# Patient Record
Sex: Female | Born: 1945
Health system: Southern US, Community
[De-identification: ages and names within clinical notes are randomized; demographics above are authoritative.]

## PROBLEM LIST (undated history)

## (undated) DIAGNOSIS — E0520 Thyrotoxicosis with toxic multinodular goiter without thyrotoxic crisis or storm: Secondary | ICD-10-CM

## (undated) DIAGNOSIS — D860 Sarcoidosis of lung: Secondary | ICD-10-CM

## (undated) DIAGNOSIS — E039 Hypothyroidism, unspecified: Secondary | ICD-10-CM

## (undated) DIAGNOSIS — R0989 Other specified symptoms and signs involving the circulatory and respiratory systems: Secondary | ICD-10-CM

## (undated) DIAGNOSIS — E559 Vitamin D deficiency, unspecified: Secondary | ICD-10-CM

## (undated) DIAGNOSIS — R7303 Prediabetes: Secondary | ICD-10-CM

## (undated) DIAGNOSIS — E7800 Pure hypercholesterolemia, unspecified: Secondary | ICD-10-CM

## (undated) DIAGNOSIS — R42 Dizziness and giddiness: Secondary | ICD-10-CM

## (undated) DIAGNOSIS — H5440 Blindness, one eye, unspecified eye: Secondary | ICD-10-CM

## (undated) DIAGNOSIS — H31019 Macula scars of posterior pole (postinflammatory) (post-traumatic), unspecified eye: Secondary | ICD-10-CM

## (undated) DIAGNOSIS — I10 Essential (primary) hypertension: Secondary | ICD-10-CM

## (undated) HISTORY — DX: Dizziness and giddiness: R42

## (undated) HISTORY — DX: Sarcoidosis of lung: D86.0

## (undated) HISTORY — DX: Hypothyroidism, unspecified: E03.9

## (undated) HISTORY — DX: Essential (primary) hypertension: I10

## (undated) HISTORY — DX: Other specified symptoms and signs involving the circulatory and respiratory systems: R09.89

## (undated) HISTORY — DX: Vitamin D deficiency, unspecified: E55.9

## (undated) HISTORY — DX: Prediabetes: R73.03

## (undated) HISTORY — DX: Blindness, one eye, unspecified eye: H54.40

## (undated) HISTORY — PX: COLONOSCOPY: SHX174

## (undated) HISTORY — DX: Macula scars of posterior pole (postinflammatory) (post-traumatic), unspecified eye: H31.019

## (undated) HISTORY — DX: Thyrotoxicosis with toxic multinodular goiter without thyrotoxic crisis or storm: E05.20

## (undated) HISTORY — DX: Pure hypercholesterolemia, unspecified: E78.00

---
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
## 1968-09-30 HISTORY — PX: BACK SURGERY: SHX140

## 1996-09-30 HISTORY — PX: NASAL SEPTUM SURGERY: SHX37

## 1998-07-12 ENCOUNTER — Ambulatory Visit (HOSPITAL_COMMUNITY): Admission: RE | Admit: 1998-07-12 | Discharge: 1998-07-12 | Payer: Self-pay | Admitting: Obstetrics and Gynecology

## 1998-07-12 ENCOUNTER — Encounter: Payer: Self-pay | Admitting: Obstetrics and Gynecology

## 1998-09-30 HISTORY — PX: TOTAL ABDOMINAL HYSTERECTOMY: SHX209

## 2000-01-29 ENCOUNTER — Ambulatory Visit (HOSPITAL_COMMUNITY): Admission: RE | Admit: 2000-01-29 | Discharge: 2000-01-29 | Payer: Self-pay | Admitting: Obstetrics and Gynecology

## 2000-01-29 ENCOUNTER — Other Ambulatory Visit: Admission: RE | Admit: 2000-01-29 | Discharge: 2000-01-29 | Payer: Self-pay | Admitting: Obstetrics and Gynecology

## 2000-03-18 ENCOUNTER — Encounter (INDEPENDENT_AMBULATORY_CARE_PROVIDER_SITE_OTHER): Payer: Self-pay

## 2000-03-18 ENCOUNTER — Inpatient Hospital Stay (HOSPITAL_COMMUNITY): Admission: RE | Admit: 2000-03-18 | Discharge: 2000-03-20 | Payer: Self-pay | Admitting: Obstetrics and Gynecology

## 2000-09-30 HISTORY — PX: KNEE ARTHROSCOPY: SUR90

## 2001-03-09 ENCOUNTER — Encounter: Admission: RE | Admit: 2001-03-09 | Discharge: 2001-05-06 | Payer: Self-pay | Admitting: Orthopedic Surgery

## 2001-05-20 ENCOUNTER — Other Ambulatory Visit: Admission: RE | Admit: 2001-05-20 | Discharge: 2001-05-20 | Payer: Self-pay | Admitting: Obstetrics and Gynecology

## 2002-03-01 ENCOUNTER — Encounter: Payer: Self-pay | Admitting: Urology

## 2002-03-01 ENCOUNTER — Encounter: Admission: RE | Admit: 2002-03-01 | Discharge: 2002-03-01 | Payer: Self-pay | Admitting: Urology

## 2003-06-10 ENCOUNTER — Ambulatory Visit (HOSPITAL_COMMUNITY): Admission: RE | Admit: 2003-06-10 | Discharge: 2003-06-10 | Payer: Self-pay | Admitting: Internal Medicine

## 2003-06-10 ENCOUNTER — Encounter: Payer: Self-pay | Admitting: Internal Medicine

## 2005-07-03 ENCOUNTER — Ambulatory Visit: Payer: Self-pay | Admitting: Internal Medicine

## 2005-07-15 ENCOUNTER — Encounter (INDEPENDENT_AMBULATORY_CARE_PROVIDER_SITE_OTHER): Payer: Self-pay | Admitting: *Deleted

## 2005-07-15 ENCOUNTER — Ambulatory Visit: Payer: Self-pay | Admitting: Internal Medicine

## 2006-07-07 ENCOUNTER — Encounter: Admission: RE | Admit: 2006-07-07 | Discharge: 2006-09-17 | Payer: Self-pay | Admitting: Orthopedic Surgery

## 2007-03-03 ENCOUNTER — Emergency Department (HOSPITAL_COMMUNITY): Admission: EM | Admit: 2007-03-03 | Discharge: 2007-03-03 | Payer: Self-pay | Admitting: Emergency Medicine

## 2008-03-15 ENCOUNTER — Emergency Department (HOSPITAL_COMMUNITY): Admission: EM | Admit: 2008-03-15 | Discharge: 2008-03-15 | Payer: Self-pay | Admitting: Emergency Medicine

## 2008-10-17 ENCOUNTER — Encounter: Admission: RE | Admit: 2008-10-17 | Discharge: 2008-10-17 | Payer: Self-pay | Admitting: Internal Medicine

## 2010-06-29 ENCOUNTER — Encounter (INDEPENDENT_AMBULATORY_CARE_PROVIDER_SITE_OTHER): Payer: Self-pay | Admitting: *Deleted

## 2010-11-01 NOTE — Letter (Signed)
Summary: Colonoscopy Letter  [facility]  [address] [place], [place] [address]   Phone: [phone]  Fax: [phone]      June 29, 2010 MRN: [id number]   Laura Maynard [address] [place], [place]  [address]   Dear [name],   According to your medical record, it is time for you to schedule a Colonoscopy. The American Cancer Society recommends this procedure as a method to detect early colon cancer. Patients with a family history of colon cancer, or a personal history of colon polyps or inflammatory bowel disease are at increased risk.  This letter has beeen generated based on the recommendations made at the time of your procedure. If you feel that in your particular situation this may no longer apply, please contact our office.  Please call our office at [phone] to schedule this appointment or to update your records at your earliest convenience.  Thank you for cooperating with [place] to provide you with the very best care possible.   Sincerely,    Iva Boop, M.D.  [facility] Gastroenterology Division [phone]

## 2011-01-31 ENCOUNTER — Ambulatory Visit (HOSPITAL_COMMUNITY)
Admission: RE | Admit: 2011-01-31 | Discharge: 2011-01-31 | Disposition: A | Discharge status: Home / Self Care | Payer: BC Managed Care – PPO | Source: Ambulatory Visit | Attending: Internal Medicine | Admitting: Internal Medicine

## 2011-01-31 ENCOUNTER — Other Ambulatory Visit (HOSPITAL_COMMUNITY): Payer: Self-pay | Admitting: Internal Medicine

## 2011-01-31 DIAGNOSIS — R0789 Other chest pain: Secondary | ICD-10-CM

## 2011-01-31 DIAGNOSIS — R079 Chest pain, unspecified: Secondary | ICD-10-CM | POA: Insufficient documentation

## 2011-02-15 NOTE — Op Note (Signed)
[facility]  Patient:    Laura Maynard, GOULART                       MRN: [id number] Proc. Date: 03/18/00 Adm. Date:  [id number] Disc. Date: [id number] Attending:  Malon Kindle                           Operative Report  PREOPERATIVE DIAGNOSES:  Fibroids, one of which is submucous; dysmenorrhea; menorrhagia; and anemia to 8 g that has been partially corrected with IV iron therapy and Lupron.  POSTOPERATIVE DIAGNOSES:  Fibroids, one of which is submucous, dysmenorrhea, menorrhagia, and anemia to 8 g that has been partially corrected with IV iron therapy and Lupron.  OPERATION:  Abdominal hysterectomy, bilateral salpingo-oophorectomy.  SURGEON:  Malachi Pro. Ambrose Mantle, M.D.  ASSISTANT:  Zenaida Niece, M.D.  ANESTHESIA:  General.  DESCRIPTION OF PROCEDURE:  The patient was brought to the operating room and placed under satisfactory general anesthesia.  The abdomen, vulva, vagina, and urethra were prepped with Betadine solution, and a Foley catheter was inserted to strait drain.  Exam revealed the uterus to be third degree retroverted, upper limit of normal size.  The adnexa were free of masses.  The patient was placed supine.  The abdomen was draped as a sterile field, and a transverse incision was made in the suprapubic location through the skin, subcutaneous tissue, and fascia.  The fascia was incised transversely.  The fascia was then separated from the rectus muscle superiorly and inferiorly.  The rectus muscle was split in the midline.  The peritoneum was opened vertically.  Exploration of the upper abdomen revealed the liver to be smooth, the gallbladder was smooth and cystic without stones, both kidneys felt normal.  I did not feel any upper abdominal abnormalities.  Exploration of the pelvis revealed the uterus to be third degree retroverted.  It was normal size.  It probably shrank in response to the Lupron.  Both tubes and ovaries appeared  normal. The cul-de-sac was free of disease.  The packs and retractors were placed to prepare the operative field.  The uterus was elevated with clamps across both upper pedicles.  The round ligaments bilaterally were bovied between clamps. The infundibulopelvic ligaments were doubly suture ligated after being cut between clamps.  The anterior peritoneum was incised.  The bladder was pushed inferiorly.  The uterine vessels were skeletonized, clamped, cut, and suture ligated.  The paracervical tissues and uterosacral ligaments were clamped, cut, and suture ligated, and the right angle of the vagina was entered and the uterus was removed by transecting the upper vagina.  Vaginal angle sutures were placed, and the central portion of the cuff was closed with interrupted figure-of-eight sutures of 0 Vicryl.  I filled the bladder with methylene blue-stained fluid.  We were not close to the bladder with any of the sutures, and there was no leakage.  I obtained hemostasis with several sutures of 0 Vicryl, identified both ureters. They were well free of the operative field and normal in caliber.  Reperitonealization of the pelvic peritoneum was done after the uterosacral ligaments were sutured together in the midline with a suture of 0 Vicryl.  Hemostasis was adequate.  Packs and retractors were removed.  The appendix appeared normal.  The abdominal wall was then closed in layers using running suture of 0 Vicryl on the peritoneum, interrupted 0 Vicryl on the rectus muscle, two running  sutures of 0 Vicryl in the rectus fascia, a running 3-0 Vicryl in the subcutaneous tissue, and staples on the skin.  The patient seemed to tolerate the procedure well.  Blood loss was about 200 cc.  Sponge and needle counts were correct, and she was returned to recovery in satisfactory condition. DD:  03/18/00 TD:  03/21/00 Job: [id number]

## 2011-02-15 NOTE — Discharge Summary (Signed)
[facility]  Patient:    Laura Maynard, Laura Maynard                       MRN: [id number] Adm. Date:  [id number] Disc. Date: [id number] Attending:  Malon Kindle                           Discharge Summary  HOSPITAL COURSE:  [age]-year-old black female with fibroids, dysmenorrhea and menorrhagia and anemia that has been partially corrected with IV iron therapy and Lupron, admitted for abdominal hysterectomy and bilateral salpingo-oophorectomy.  Patient underwent that procedure by Dr. Malachi Pro. Henley with Dr. Leighton Roach Meisinger assisting under general anesthesia, March 18, 2000, blood loss about 200 cc.  Postoperatively, the patient did well.  She did have a temperature elevation at 100.6 degrees at noon on the first postop day; subsequently, she was afebrile.  She was unable to void well.  Postvoid residual was 500 cc and repeated at 700 cc.  She did have ecchymoses around her incision.  She was discharged with a catheter and placed on Macrobid to help treat catheter-induced urinary infection.  LABORATORY AND X-RAY FINDINGS:  Laboratory data showed a WBC of 5200, hemoglobin 11.8, hematocrit 37.3, platelet count 325,000 on admission. Followup hematocrit is 31.9 and 32.7.  Comprehensive metabolic panel was normal.  Urinalysis did show 11-20 wbcs on a voided specimen.  Pathology report showed uterus, tubes and ovaries with nonspecific chronic cervicitis, benign proliferative endometrium, leiomyomata uteri, submucosal, intramural and subserosal; bilateral ovaries and fallopian tubes with no pathologic abnormalities.  Patient had an EKG prior to surgery in the doctors office that was normal.  Chest x-ray had also been done outside the hospital and was normal.  FINAL DIAGNOSES: 1. Leiomyomata uteri. 2. Menorrhagia. 3. Dysmenorrhea. 4. History of anemia that required intravenous iron and Lupron for correction.   OPERATION:  Abdominal hysterectomy,  bilateral salpingo-oophorectomy.  FINAL CONDITION:  Improved.  DISCHARGE INSTRUCTIONS:  Instructions include a regular diet, no vaginal entrance, no heavy lifting or strenuous activity, report any temperature greater than 100.4 degrees, report any unusual problems, Mepergan Fortis, 20 tablets, one every four to six hours as needed for pain, and Macrobid once twice a day.  The patient is advised to return to the office in four days.  At that point, we will try a postvoid residual and also remove her staples. DD:  04/17/00 TD:  04/18/00 Job: [id number]

## 2011-02-15 NOTE — H&P (Signed)
[facility]  Patient:    Laura Maynard, Laura Maynard                       MRN: [id number] Adm. Date:  [id number] Attending:  Malon Kindle CC:         Genene Churn. Cyndie Chime, M.D.                         History and Physical  HISTORY OF PRESENT ILLNESS:  This is a 65 year old black divorced female, para 0, who was admitted to the hospital for abdominal hysterectomy, bilateral salpingo-oophorectomy, because of submucous fibroids, severe menorrhagia, severe dysmenorrhea, and persistent anemia.  Last menstrual period January 11, 2000.  The patient was given Lupron on May 1 because of severe bleeding, dysmenorrhea, and anemia.  At that time, the anemia was at 8 g.  The patient has been battling anemia for years.  She claims that each period is accompanied by three days in which the bleeding is so heavy and the pain so severe that she loses the three days.  She states that she uses 24 tampons and 24 pads per day.  She tried two oral iron preparations that caused constipation and diarrhea, so she was unable to take them consistently. Finally she was given Lupron and Dr. Cyndie Chime gave her intravenous iron on Feb 06, 2000.  Since that time, she has experienced some hot flashes and sleeplessness, but her hemoglobin has risen to 11.8 with a hematocrit of 37.3. Over the last two or three years, she has been offered D&C, hysteroscopy, or hysterectomy.  The ultrasound suggests a submucous fibroid that possibly could be reached with the hysteroscope and resected; however, the patient declined to have anything done.  At this point, she has become desperate, and she was informed that the problem might be corrected with D&C and hysteroscopy, but cure could be certified with hysterectomy.  ALLERGIES:  SULFA, it caused hives.  MACRODANTIN caused nausea and headaches.  PAST MEDICAL HISTORY:   Illnesses:  Sarcoidosis of the lungs.  PAST SURGICAL HISTORY:  Ruptured disk, right  cataract, deviated septum.  MEDICATIONS:  None.  SOCIAL HISTORY:  Alcohol:  None.  Tobacco:  None.  REVIEW OF SYSTEMS:  Noncontributory except for the presumed side effects from Lupron, including hot flashes and sleeplessness.  She has no known heart problems.  FAMILY HISTORY:  Mother 86 with arthritis.  Father died at 52 from emphysema. Two sisters and two brothers are living.  One brother has diabetes, and another brother has high blood pressure.  PHYSICAL EXAMINATION:  GENERAL:  A well-developed, well-nourished black female in no distress.  VITAL SIGNS:  Weight is 168-3/4.  Blood pressure 150/70, pulse 80.  HEENT:  Head, eyes, ears, nose, and throat reveal no cranial abnormalities. The right pupil is distorted.  Extraocular movements are intact.  Nose and pharynx are clear.  NECK:  Supple without thyromegaly.  HEART:  Normal size and sounds, no murmurs.  LUNGS:  Clear to P&A.  BREASTS:  Soft without masses lying down or sitting up.  ABDOMEN:  Soft and flat and nontender.  Liver, spleen, and kidneys are not felt.  No masses are palpable.  PELVIC:  The Pap smear on Jan 29, 2000, was within normal limits.  Vulva and vagina are clean.  The cervix is clean with a very tight os.  The uterus is posterior, upper limit of normal size.  Adnexa are clear.  Rectal exam in May was negative.  ADMITTING IMPRESSION:  Submucous leiomyomata, severe menorrhagia and dysmenorrhea, with resulting anemia that has been corrected with IV iron and Lupron.  Dysmenorrhea treated and only partially corrected with nonsteroidal anti-inflammatory drugs.  PLAN:  Operation proposed:  Abdominal hysterectomy and bilateral salpingo-oophorectomy.  The patient understands the risks of surgery, including but not limited to heart attack, stroke, pulmonary embolus, wound disruption, hemorrhage with need for reoperation and/or transfusion, fistula formation, nerve injury, intestinal instruction,  thrombophlebitis.  She understands and agrees to proceed. DD:  03/18/00 TD:  03/20/00 Job: [id number]

## 2011-03-18 ENCOUNTER — Telehealth: Payer: Self-pay

## 2011-03-18 ENCOUNTER — Ambulatory Visit (AMBULATORY_SURGERY_CENTER): Payer: BC Managed Care – PPO | Admitting: *Deleted

## 2011-03-18 ENCOUNTER — Encounter: Payer: Self-pay | Admitting: Internal Medicine

## 2011-03-18 VITALS — Ht 67.0 in | Wt 165.0 lb

## 2011-03-18 DIAGNOSIS — Z86010 Personal history of colonic polyps: Secondary | ICD-10-CM

## 2011-03-18 MED ORDER — PEG-KCL-NACL-NASULF-NA ASC-C 100 G PO SOLR: ORAL | Status: DC

## 2011-03-18 NOTE — Telephone Encounter (Signed)
I spoke with the patient and advised her we needed to reschedule her upcoming colonoscopy to 9:30 arrival rather than 8:00.  I have mailed her new instructions in the mail.

## 2011-03-18 NOTE — Telephone Encounter (Signed)
I have left a message for the patient to call back to discuss arrival time

## 2011-03-28 ENCOUNTER — Ambulatory Visit (AMBULATORY_SURGERY_CENTER): Payer: BC Managed Care – PPO | Admitting: Internal Medicine

## 2011-03-28 ENCOUNTER — Other Ambulatory Visit: Payer: BC Managed Care – PPO | Admitting: Internal Medicine

## 2011-03-28 ENCOUNTER — Encounter: Payer: Self-pay | Admitting: Internal Medicine

## 2011-03-28 VITALS — BP 143/68 | HR 83 | Temp 97.7°F | Resp 20 | Ht 67.0 in | Wt 165.0 lb

## 2011-03-28 DIAGNOSIS — Z86010 Personal history of colonic polyps: Secondary | ICD-10-CM

## 2011-03-28 DIAGNOSIS — Z1211 Encounter for screening for malignant neoplasm of colon: Secondary | ICD-10-CM

## 2011-03-28 MED ORDER — ONDANSETRON 4 MG PO TBDP
4.0000 mg | ORAL_TABLET | Freq: Once | ORAL | Status: AC
  Administered 2011-03-28: 4 mg via ORAL

## 2011-03-28 MED ORDER — FLEET ENEMA 7-19 GM/118ML RE ENEM
1.0000 | ENEMA | Freq: Once | RECTAL | Status: AC
  Administered 2011-03-28: 1 via RECTAL

## 2011-03-28 MED ORDER — SODIUM CHLORIDE 0.9 % IV SOLN: 500.0000 mL | INTRAVENOUS | Status: DC

## 2011-03-28 NOTE — Progress Notes (Signed)
ABDOMINAL PRESSURE APPLIED BY TECH TO HELP ADVANCE SCOPE TO CECUM PER MD E Tracee Mccreery RN ALL MEDS TITRATED PER MD REQUEST E Ariellah Faust RN AFTER PROCEDURE PT STATES SHE FEELS FINE, NO DISTRESS NOTED  E Amal Renbarger RN

## 2011-03-28 NOTE — Progress Notes (Signed)
Pt arrived to adm nauseated with a headache.  States she was unable to complete 2nd part of moviprep.  Paged Dr Leone Payor and received orders to give 2 fleets enemas and 4mg  zofran.  Pt. Given zofran 1st at 915.  At 950 pt states she is feeling "some better".  Taken to the bathroom and pt gave herself an enema.  Results were dark brown liquid.  Reported this to Dr. Leone Payor and he ordered 1 more enema.  Second enema given at 1020.  Results were lighter brown liquid than the first.  Pt tolerated well.  She was helped back to bed and an IV was started.

## 2011-03-28 NOTE — Patient Instructions (Signed)
Discharge instructions given with verbal understanding. Normal procedure. Resume previous medications. 

## 2011-03-29 ENCOUNTER — Telehealth: Payer: Self-pay

## 2011-03-29 NOTE — Telephone Encounter (Signed)

## 2012-01-29 DIAGNOSIS — Z1231 Encounter for screening mammogram for malignant neoplasm of breast: Secondary | ICD-10-CM | POA: Diagnosis not present

## 2012-02-03 DIAGNOSIS — I10 Essential (primary) hypertension: Secondary | ICD-10-CM | POA: Diagnosis not present

## 2012-02-03 DIAGNOSIS — R7309 Other abnormal glucose: Secondary | ICD-10-CM | POA: Diagnosis not present

## 2012-02-03 DIAGNOSIS — E782 Mixed hyperlipidemia: Secondary | ICD-10-CM | POA: Diagnosis not present

## 2012-02-03 DIAGNOSIS — E559 Vitamin D deficiency, unspecified: Secondary | ICD-10-CM | POA: Diagnosis not present

## 2012-02-03 DIAGNOSIS — Z79899 Other long term (current) drug therapy: Secondary | ICD-10-CM | POA: Diagnosis not present

## 2012-02-04 DIAGNOSIS — Z124 Encounter for screening for malignant neoplasm of cervix: Secondary | ICD-10-CM | POA: Diagnosis not present

## 2012-02-04 DIAGNOSIS — Z01419 Encounter for gynecological examination (general) (routine) without abnormal findings: Secondary | ICD-10-CM | POA: Diagnosis not present

## 2012-02-04 DIAGNOSIS — Z0000 Encounter for general adult medical examination without abnormal findings: Secondary | ICD-10-CM | POA: Diagnosis not present

## 2012-03-04 DIAGNOSIS — H259 Unspecified age-related cataract: Secondary | ICD-10-CM | POA: Diagnosis not present

## 2012-03-31 DIAGNOSIS — M1710 Unilateral primary osteoarthritis, unspecified knee: Secondary | ICD-10-CM | POA: Diagnosis not present

## 2012-05-14 DIAGNOSIS — M1710 Unilateral primary osteoarthritis, unspecified knee: Secondary | ICD-10-CM | POA: Diagnosis not present

## 2012-05-20 DIAGNOSIS — M1710 Unilateral primary osteoarthritis, unspecified knee: Secondary | ICD-10-CM | POA: Diagnosis not present

## 2012-06-04 DIAGNOSIS — Z882 Allergy status to sulfonamides status: Secondary | ICD-10-CM | POA: Diagnosis not present

## 2012-06-04 DIAGNOSIS — G43009 Migraine without aura, not intractable, without status migrainosus: Secondary | ICD-10-CM | POA: Diagnosis not present

## 2012-06-04 DIAGNOSIS — Z79899 Other long term (current) drug therapy: Secondary | ICD-10-CM | POA: Diagnosis not present

## 2012-06-04 DIAGNOSIS — G43909 Migraine, unspecified, not intractable, without status migrainosus: Secondary | ICD-10-CM | POA: Diagnosis not present

## 2012-06-04 DIAGNOSIS — J189 Pneumonia, unspecified organism: Secondary | ICD-10-CM | POA: Diagnosis not present

## 2012-06-04 DIAGNOSIS — I672 Cerebral atherosclerosis: Secondary | ICD-10-CM | POA: Diagnosis not present

## 2012-06-04 DIAGNOSIS — R42 Dizziness and giddiness: Secondary | ICD-10-CM | POA: Diagnosis not present

## 2012-06-04 DIAGNOSIS — Z880 Allergy status to penicillin: Secondary | ICD-10-CM | POA: Diagnosis not present

## 2012-06-10 DIAGNOSIS — E782 Mixed hyperlipidemia: Secondary | ICD-10-CM | POA: Diagnosis not present

## 2012-06-10 DIAGNOSIS — R42 Dizziness and giddiness: Secondary | ICD-10-CM | POA: Diagnosis not present

## 2012-06-10 DIAGNOSIS — Z23 Encounter for immunization: Secondary | ICD-10-CM | POA: Diagnosis not present

## 2012-07-24 DIAGNOSIS — M26609 Unspecified temporomandibular joint disorder, unspecified side: Secondary | ICD-10-CM | POA: Diagnosis not present

## 2012-08-20 ENCOUNTER — Ambulatory Visit (INDEPENDENT_AMBULATORY_CARE_PROVIDER_SITE_OTHER): Payer: Medicare Other

## 2012-08-20 DIAGNOSIS — M79609 Pain in unspecified limb: Secondary | ICD-10-CM | POA: Diagnosis not present

## 2012-10-21 DIAGNOSIS — Z79899 Other long term (current) drug therapy: Secondary | ICD-10-CM | POA: Diagnosis not present

## 2012-10-21 DIAGNOSIS — I10 Essential (primary) hypertension: Secondary | ICD-10-CM | POA: Diagnosis not present

## 2012-10-21 DIAGNOSIS — R7309 Other abnormal glucose: Secondary | ICD-10-CM | POA: Diagnosis not present

## 2012-10-21 DIAGNOSIS — N3000 Acute cystitis without hematuria: Secondary | ICD-10-CM | POA: Diagnosis not present

## 2012-10-21 DIAGNOSIS — E559 Vitamin D deficiency, unspecified: Secondary | ICD-10-CM | POA: Diagnosis not present

## 2012-10-21 DIAGNOSIS — E782 Mixed hyperlipidemia: Secondary | ICD-10-CM | POA: Diagnosis not present

## 2012-10-21 DIAGNOSIS — Z1212 Encounter for screening for malignant neoplasm of rectum: Secondary | ICD-10-CM | POA: Diagnosis not present

## 2012-11-09 DIAGNOSIS — D485 Neoplasm of uncertain behavior of skin: Secondary | ICD-10-CM | POA: Diagnosis not present

## 2013-01-25 DIAGNOSIS — I10 Essential (primary) hypertension: Secondary | ICD-10-CM | POA: Diagnosis not present

## 2013-01-25 DIAGNOSIS — Z79899 Other long term (current) drug therapy: Secondary | ICD-10-CM | POA: Diagnosis not present

## 2013-01-25 DIAGNOSIS — E782 Mixed hyperlipidemia: Secondary | ICD-10-CM | POA: Diagnosis not present

## 2013-02-08 DIAGNOSIS — M1710 Unilateral primary osteoarthritis, unspecified knee: Secondary | ICD-10-CM | POA: Diagnosis not present

## 2013-02-17 DIAGNOSIS — Z1231 Encounter for screening mammogram for malignant neoplasm of breast: Secondary | ICD-10-CM | POA: Diagnosis not present

## 2013-03-10 DIAGNOSIS — H31019 Macula scars of posterior pole (postinflammatory) (post-traumatic), unspecified eye: Secondary | ICD-10-CM

## 2013-03-10 HISTORY — DX: Macula scars of posterior pole (postinflammatory) (post-traumatic), unspecified eye: H31.019

## 2013-03-23 DIAGNOSIS — M1710 Unilateral primary osteoarthritis, unspecified knee: Secondary | ICD-10-CM | POA: Diagnosis not present

## 2013-03-31 DIAGNOSIS — M1710 Unilateral primary osteoarthritis, unspecified knee: Secondary | ICD-10-CM | POA: Diagnosis not present

## 2013-04-06 DIAGNOSIS — M1710 Unilateral primary osteoarthritis, unspecified knee: Secondary | ICD-10-CM | POA: Diagnosis not present

## 2013-04-26 DIAGNOSIS — E559 Vitamin D deficiency, unspecified: Secondary | ICD-10-CM | POA: Diagnosis not present

## 2013-04-26 DIAGNOSIS — Z79899 Other long term (current) drug therapy: Secondary | ICD-10-CM | POA: Diagnosis not present

## 2013-04-26 DIAGNOSIS — E782 Mixed hyperlipidemia: Secondary | ICD-10-CM | POA: Diagnosis not present

## 2013-04-26 DIAGNOSIS — I10 Essential (primary) hypertension: Secondary | ICD-10-CM | POA: Diagnosis not present

## 2013-04-26 DIAGNOSIS — R7309 Other abnormal glucose: Secondary | ICD-10-CM | POA: Diagnosis not present

## 2013-05-28 DIAGNOSIS — E559 Vitamin D deficiency, unspecified: Secondary | ICD-10-CM | POA: Diagnosis not present

## 2013-05-28 DIAGNOSIS — E782 Mixed hyperlipidemia: Secondary | ICD-10-CM | POA: Diagnosis not present

## 2013-06-04 DIAGNOSIS — M79609 Pain in unspecified limb: Secondary | ICD-10-CM | POA: Diagnosis not present

## 2013-06-04 DIAGNOSIS — M2550 Pain in unspecified joint: Secondary | ICD-10-CM | POA: Diagnosis not present

## 2013-06-08 ENCOUNTER — Other Ambulatory Visit: Payer: Self-pay | Admitting: Internal Medicine

## 2013-06-08 DIAGNOSIS — R52 Pain, unspecified: Secondary | ICD-10-CM

## 2013-06-09 ENCOUNTER — Other Ambulatory Visit: Payer: Self-pay | Admitting: Emergency Medicine

## 2013-06-09 ENCOUNTER — Other Ambulatory Visit: Payer: Self-pay | Admitting: Internal Medicine

## 2013-06-09 ENCOUNTER — Ambulatory Visit
Admission: RE | Admit: 2013-06-09 | Discharge: 2013-06-09 | Disposition: A | Discharge status: Home / Self Care | Payer: Medicare Other | Source: Ambulatory Visit | Attending: Internal Medicine | Admitting: Internal Medicine

## 2013-06-09 DIAGNOSIS — M79609 Pain in unspecified limb: Secondary | ICD-10-CM | POA: Diagnosis not present

## 2013-06-09 DIAGNOSIS — R52 Pain, unspecified: Secondary | ICD-10-CM

## 2013-06-09 DIAGNOSIS — M79604 Pain in right leg: Secondary | ICD-10-CM

## 2013-06-09 DIAGNOSIS — M7989 Other specified soft tissue disorders: Secondary | ICD-10-CM | POA: Diagnosis not present

## 2013-06-10 ENCOUNTER — Ambulatory Visit
Admission: RE | Admit: 2013-06-10 | Discharge: 2013-06-10 | Disposition: A | Discharge status: Home / Self Care | Payer: Medicare Other | Source: Ambulatory Visit | Attending: Emergency Medicine | Admitting: Emergency Medicine

## 2013-06-10 ENCOUNTER — Other Ambulatory Visit: Payer: Self-pay | Admitting: Internal Medicine

## 2013-06-10 DIAGNOSIS — M79604 Pain in right leg: Secondary | ICD-10-CM

## 2013-06-10 DIAGNOSIS — R599 Enlarged lymph nodes, unspecified: Secondary | ICD-10-CM | POA: Diagnosis not present

## 2013-06-10 MED ORDER — IOHEXOL 350 MG/ML SOLN
100.0000 mL | Freq: Once | INTRAVENOUS | Status: AC | PRN
  Administered 2013-06-10: 100 mL via INTRAVENOUS

## 2013-06-16 ENCOUNTER — Ambulatory Visit (HOSPITAL_COMMUNITY)
Admission: RE | Admit: 2013-06-16 | Discharge: 2013-06-16 | Disposition: A | Discharge status: Home / Self Care | Payer: Medicare Other | Source: Ambulatory Visit | Attending: Emergency Medicine | Admitting: Emergency Medicine

## 2013-06-16 ENCOUNTER — Other Ambulatory Visit (HOSPITAL_COMMUNITY): Payer: Self-pay | Admitting: Emergency Medicine

## 2013-06-16 DIAGNOSIS — Q762 Congenital spondylolisthesis: Secondary | ICD-10-CM | POA: Insufficient documentation

## 2013-06-16 DIAGNOSIS — M5137 Other intervertebral disc degeneration, lumbosacral region: Secondary | ICD-10-CM | POA: Diagnosis not present

## 2013-06-16 DIAGNOSIS — M25559 Pain in unspecified hip: Secondary | ICD-10-CM | POA: Diagnosis not present

## 2013-06-16 DIAGNOSIS — M79609 Pain in unspecified limb: Secondary | ICD-10-CM | POA: Diagnosis not present

## 2013-06-16 DIAGNOSIS — IMO0002 Reserved for concepts with insufficient information to code with codable children: Secondary | ICD-10-CM

## 2013-06-16 DIAGNOSIS — M47817 Spondylosis without myelopathy or radiculopathy, lumbosacral region: Secondary | ICD-10-CM | POA: Diagnosis not present

## 2013-06-16 DIAGNOSIS — M51379 Other intervertebral disc degeneration, lumbosacral region without mention of lumbar back pain or lower extremity pain: Secondary | ICD-10-CM | POA: Insufficient documentation

## 2013-06-29 DIAGNOSIS — Z79899 Other long term (current) drug therapy: Secondary | ICD-10-CM | POA: Diagnosis not present

## 2013-06-29 DIAGNOSIS — M79609 Pain in unspecified limb: Secondary | ICD-10-CM | POA: Diagnosis not present

## 2013-06-29 DIAGNOSIS — E782 Mixed hyperlipidemia: Secondary | ICD-10-CM | POA: Diagnosis not present

## 2013-06-29 DIAGNOSIS — E559 Vitamin D deficiency, unspecified: Secondary | ICD-10-CM | POA: Diagnosis not present

## 2013-06-29 DIAGNOSIS — R7989 Other specified abnormal findings of blood chemistry: Secondary | ICD-10-CM | POA: Diagnosis not present

## 2013-06-29 DIAGNOSIS — G609 Hereditary and idiopathic neuropathy, unspecified: Secondary | ICD-10-CM | POA: Diagnosis not present

## 2013-07-04 DIAGNOSIS — Z23 Encounter for immunization: Secondary | ICD-10-CM | POA: Diagnosis not present

## 2013-07-13 ENCOUNTER — Encounter: Payer: Self-pay | Admitting: Neurology

## 2013-07-13 ENCOUNTER — Encounter (INDEPENDENT_AMBULATORY_CARE_PROVIDER_SITE_OTHER): Payer: Self-pay

## 2013-07-13 ENCOUNTER — Ambulatory Visit (INDEPENDENT_AMBULATORY_CARE_PROVIDER_SITE_OTHER): Payer: Medicare Other | Admitting: Neurology

## 2013-07-13 VITALS — BP 128/76 | HR 90 | Ht 67.0 in | Wt 158.0 lb

## 2013-07-13 DIAGNOSIS — M79662 Pain in left lower leg: Secondary | ICD-10-CM

## 2013-07-13 DIAGNOSIS — M79609 Pain in unspecified limb: Secondary | ICD-10-CM

## 2013-07-13 DIAGNOSIS — M545 Low back pain: Secondary | ICD-10-CM

## 2013-07-13 MED ORDER — MELOXICAM 7.5 MG PO TABS: 15.0000 mg | ORAL_TABLET | Freq: Two times a day (BID) | ORAL | Status: DC

## 2013-07-13 NOTE — Progress Notes (Signed)
[facility]  PATIENT: Laura Maynard DOB: [birth year]  HISTORICAL Laura Maynard is a 67 year old right-handed female, referred by her primary care physician Laura Maynard  for evaluation of left calf muscle pain   She had a past medical history of hyperlipidemia, migraine headaches, pulmonary sarcoidosis, was treated with prednisone for one year in 1990s, she is no longer on any treatment. She was evaluated in our office in 2011, for migraine headaches, which has much improved .  She had a history of bilateral knee pain, in July 2014, she was found to have elevated cholesterol, she was given prescription of Pravastatin 40 mg, Zetia 20 mg she took it from July to August 2014, her number has much improved, but 2 weeks into treatment, she noticed bilateral back pain, hip pain, especially left calf pain, she complains of radiating pain from left calf to her left hip, achy, she denies gait difficulty, she denies bowel or bladder involvement.  She had x-ray of lumbar, which showed  degenerative disc disease and facet disease throughout the lumbar spine. Slight anterolisthesis of L3 on L4 likely related to facet disease. No fracture. SI joints are symmetric and unremarkable. Mild levoscoliosis in the thoracolumbar spine  Ultrasound of left lower extremity was negative for DVT. CT of the chest showed no evidence of PE.  REVIEW OF SYSTEMS: Full 14 system review of systems performed and notable only for fatigue, swelling in legs, ringing in ears, easy bruising, feeling hot, feeling cold, joint pain, cramps, achy muscles, headaches, numbness, insomnia, restless leg  ALLERGIES: Allergies  Allergen Reactions  . Penicillins Hives  . Sulfa Antibiotics Hives    HOME MEDICATIONS: Outpatient Prescriptions Prior to Visit  Medication Sig Dispense Refill  . aspirin 81 MG tablet Take 81 mg by mouth daily.        . Butalbital-APAP-Caffeine 50-325-40 MG per capsule Take 1 capsule by mouth every 4  (four) hours as needed.        . Ferrous Sulfate (SLOW RELEASE IRON PO) Take 1 tablet by mouth daily. Twice a week       . Naproxen Sodium (ALEVE PO) Take 1 tablet by mouth as needed.         Facility-Administered Medications Prior to Visit  Medication Dose Route Frequency Provider Last Rate Last Dose  . 0.9 %  sodium chloride infusion  500 mL Intravenous Continuous Laura Boop, MD        PAST MEDICAL HISTORY: Past Medical History  Diagnosis Date  . Cataract   . Hyperlipidemia   . Migraine   . Blind right eye     legally  . Sarcoidosis, lung 15 YRS AGO    NO TREATMENTS  . Neuropathy     PAST SURGICAL HISTORY: Past Surgical History  Procedure Laterality Date  . Eye surgery      right eye  . Back surgery    . Nasal septum surgery    . Total abdominal hysterectomy    . Knee arthroscopy    . Colonoscopy  2006; 03/28/11    2 small adenomas; normal    FAMILY HISTORY: Family History  Problem Relation Age of Onset  . Lung cancer Sister 83  . Throat cancer Brother 9  . Colon cancer Neg Hx   . COPD Sister   . Diabetes Brother   . Stroke Mother   . Emphysema Father     SOCIAL HISTORY:  History   Social History  . Marital Status: Divorced  Spouse Name: N/A    Number of Children: 0  . Years of Education: college   Occupational History  .      Retired   Social History Main Topics  . Smoking status: Never Smoker   . Smokeless tobacco: Never Used  . Alcohol Use: 0.5 oz/week    1 drink(s) per week     Comment: RARE  . Drug Use: No  . Sexual Activity: Not on file   Other Topics Concern  . Not on file   Social History Narrative   Patient is retired and lives at home alone. Patient  has college education.   Caffeine- two cups of caffeine daily.   Right handed.     PHYSICAL EXAM   Filed Vitals:   07/13/13 0821  BP: 128/76  Pulse: 90  Height: 5\' 7"  (1.702 m)  Weight: 158 lb (71.668 kg)   Body mass index is 24.74 kg/(m^2).   Generalized: In no  acute distress  Neck: Supple, no carotid bruits   Cardiac: Regular rate rhythm  Pulmonary: Clear to auscultation bilaterally  Musculoskeletal: No deformity  Neurological examination  Mentation: Alert oriented to time, place, history taking, and causual conversation  Cranial nerve II-XII: Pupils were equal round reactive to light extraocular movements were full, visual field were full on confrontational test. facial sensation and strength were normal. hearing was intact to finger rubbing bilaterally. Uvula tongue midline.  head turning and shoulder shrug and were normal and symmetric.Tongue protrusion into cheek strength was normal.  Motor: normal tone, bulk and strength.  Sensory: Intact to fine touch, pinprick, preserved vibratory sensation, and proprioception at toes.  Coordination: Normal finger to nose, heel-to-shin bilaterally there was no truncal ataxia  Gait: Rising up from seated position without assistance, normal stance, without trunk ataxia, moderate stride, good arm swing, smooth turning, able to perform tiptoe, and heel walking without difficulty.   Romberg signs: Negative  Deep tendon reflexes: Brachioradialis 2/2, biceps 2/2, triceps 2/2, patellar 2/2, Achilles 2/2, plantar responses were flexor bilaterally.   DIAGNOSTIC DATA (LABS, IMAGING, TESTING) - I reviewed patient records, labs, notes, testing and imaging myself where available.  ASSESSMENT AND PLAN   67 year old Caucasian female, with history of lumbar decompression surgery, now presenting with low back pain, she also complains of left calf, left hip pain, Normal neurological examinations.  1 suggestive of left lumbar radiculopathy, 2. Will proceed with MRI of lumbar, 3 EMG nerve conduction study 4 Mobic as needed  .    Laura Maynard, M.D. Ph.D.  [facility] [address], [address] [place], [place] [address] [phone]

## 2013-07-19 DIAGNOSIS — N3000 Acute cystitis without hematuria: Secondary | ICD-10-CM | POA: Diagnosis not present

## 2013-07-27 ENCOUNTER — Encounter (INDEPENDENT_AMBULATORY_CARE_PROVIDER_SITE_OTHER): Payer: Medicare Other | Admitting: Radiology

## 2013-07-27 ENCOUNTER — Ambulatory Visit (INDEPENDENT_AMBULATORY_CARE_PROVIDER_SITE_OTHER): Payer: Medicare Other | Admitting: Neurology

## 2013-07-27 DIAGNOSIS — M545 Low back pain: Secondary | ICD-10-CM | POA: Diagnosis not present

## 2013-07-27 DIAGNOSIS — M79609 Pain in unspecified limb: Secondary | ICD-10-CM

## 2013-07-27 DIAGNOSIS — M79662 Pain in left lower leg: Secondary | ICD-10-CM

## 2013-07-27 DIAGNOSIS — Z0289 Encounter for other administrative examinations: Secondary | ICD-10-CM

## 2013-07-27 NOTE — Procedures (Signed)
    [facility]  NCS (NERVE CONDUCTION STUDY) WITH EMG (ELECTROMYOGRAPHY) REPORT   STUDY DATE: 07/27/2013 PATIENT NAME: ANELIA CARRIVEAU DOB: [birth year] MRN: [id number]    TECHNOLOGIST: Gearldine Shown ELECTROMYOGRAPHER: Levert Feinstein M.D.  CLINICAL INFORMATION: 67 year old Caucasian female, with history of lumbar decompression surgery, now presenting with low back pain, she also complains of left calf, left hip pain  On exam: Bilateral lower extremity muscle strength was normal. Deep tendon reflexes were present and symmetric  FINDINGS: NERVE CONDUCTION STUDY: Bilateral sural sensory responses were normal. Bilateral peroneal to EDB, and the tibial motor responses were normal.  Bilateral tibial H. reflexes were present and symmetric  NEEDLE ELECTROMYOGRAPHY:  Left tibialis anterior needle examination was normal, normally insertion activity, no spontaneous activity, normal morphology motor unit potential, with normal recruitment patterns  She could not tolerate more extensive needle examination   IMPRESSION: This is a normal study. There is no electrodiagnostic evidence of large fiber peripheral neuropathy, or left lumbar radiculopathy    INTERPRETING PHYSICIAN:   Levert Feinstein M.D. Ph.D. [facility] [address], [address] [place], [place] [address] [phone]

## 2013-08-05 ENCOUNTER — Ambulatory Visit
Admission: RE | Admit: 2013-08-05 | Discharge: 2013-08-05 | Disposition: A | Discharge status: Home / Self Care | Payer: Medicare Other | Source: Ambulatory Visit | Attending: Neurology | Admitting: Neurology

## 2013-08-05 DIAGNOSIS — R209 Unspecified disturbances of skin sensation: Secondary | ICD-10-CM | POA: Diagnosis not present

## 2013-08-05 DIAGNOSIS — M545 Low back pain: Secondary | ICD-10-CM

## 2013-08-05 DIAGNOSIS — M79662 Pain in left lower leg: Secondary | ICD-10-CM

## 2013-08-09 NOTE — Progress Notes (Signed)
Quick Note:  Please call patient, mild lumbar degenerative changes, no acute lesions. ______

## 2013-08-11 NOTE — Progress Notes (Signed)
Quick Note:  Spoke to patient and relayed MRI lumbar results, per Dr. Terrace Arabia. Also faxed copy to her PCP, confirmation received and mailed copy to patient. ______

## 2013-10-26 DIAGNOSIS — E559 Vitamin D deficiency, unspecified: Secondary | ICD-10-CM | POA: Insufficient documentation

## 2013-10-26 DIAGNOSIS — R0989 Other specified symptoms and signs involving the circulatory and respiratory systems: Secondary | ICD-10-CM | POA: Insufficient documentation

## 2013-10-26 DIAGNOSIS — Z87898 Personal history of other specified conditions: Secondary | ICD-10-CM | POA: Insufficient documentation

## 2013-10-27 ENCOUNTER — Ambulatory Visit (INDEPENDENT_AMBULATORY_CARE_PROVIDER_SITE_OTHER): Payer: Medicare Other | Admitting: Internal Medicine

## 2013-10-27 ENCOUNTER — Encounter: Payer: Self-pay | Admitting: Internal Medicine

## 2013-10-27 VITALS — BP 140/78 | HR 72 | Temp 97.9°F | Resp 16 | Ht 66.5 in | Wt 157.6 lb

## 2013-10-27 DIAGNOSIS — E559 Vitamin D deficiency, unspecified: Secondary | ICD-10-CM

## 2013-10-27 DIAGNOSIS — I10 Essential (primary) hypertension: Secondary | ICD-10-CM

## 2013-10-27 DIAGNOSIS — R7309 Other abnormal glucose: Secondary | ICD-10-CM | POA: Diagnosis not present

## 2013-10-27 DIAGNOSIS — E782 Mixed hyperlipidemia: Secondary | ICD-10-CM | POA: Diagnosis not present

## 2013-10-27 DIAGNOSIS — Z79899 Other long term (current) drug therapy: Secondary | ICD-10-CM | POA: Diagnosis not present

## 2013-10-27 DIAGNOSIS — R7303 Prediabetes: Secondary | ICD-10-CM

## 2013-10-27 DIAGNOSIS — Z1212 Encounter for screening for malignant neoplasm of rectum: Secondary | ICD-10-CM

## 2013-10-27 DIAGNOSIS — Z0000 Encounter for general adult medical examination without abnormal findings: Secondary | ICD-10-CM

## 2013-10-27 LAB — CBC WITH DIFFERENTIAL/PLATELET
BASOS PCT: 1 % (ref 0–1)
Basophils Absolute: 0 10*3/uL (ref 0.0–0.1)
Eosinophils Absolute: 0.1 10*3/uL (ref 0.0–0.7)
Eosinophils Relative: 1 % (ref 0–5)
HEMATOCRIT: 37.2 % (ref 36.0–46.0)
HEMOGLOBIN: 12.1 g/dL (ref 12.0–15.0)
LYMPHS ABS: 2.4 10*3/uL (ref 0.7–4.0)
LYMPHS PCT: 38 % (ref 12–46)
MCH: 27.1 pg (ref 26.0–34.0)
MCHC: 32.5 g/dL (ref 30.0–36.0)
MCV: 83.2 fL (ref 78.0–100.0)
MONOS PCT: 8 % (ref 3–12)
Monocytes Absolute: 0.5 10*3/uL (ref 0.1–1.0)
NEUTROS ABS: 3.2 10*3/uL (ref 1.7–7.7)
NEUTROS PCT: 52 % (ref 43–77)
Platelets: 369 10*3/uL (ref 150–400)
RBC: 4.47 MIL/uL (ref 3.87–5.11)
RDW: 14.3 % (ref 11.5–15.5)
WBC: 6.3 10*3/uL (ref 4.0–10.5)

## 2013-10-27 LAB — BASIC METABOLIC PANEL WITH GFR
BUN: 12 mg/dL (ref 6–23)
CHLORIDE: 106 meq/L (ref 96–112)
CO2: 29 mEq/L (ref 19–32)
Calcium: 9.8 mg/dL (ref 8.4–10.5)
Creat: 0.65 mg/dL (ref 0.50–1.10)
Glucose, Bld: 85 mg/dL (ref 70–99)
POTASSIUM: 4.8 meq/L (ref 3.5–5.3)
SODIUM: 143 meq/L (ref 135–145)

## 2013-10-27 LAB — HEPATIC FUNCTION PANEL
ALT: 14 U/L (ref 0–35)
AST: 19 U/L (ref 0–37)
Albumin: 4.7 g/dL (ref 3.5–5.2)
Alkaline Phosphatase: 88 U/L (ref 39–117)
Bilirubin, Direct: 0.1 mg/dL (ref 0.0–0.3)
Indirect Bilirubin: 0.2 mg/dL (ref 0.2–1.2)
TOTAL PROTEIN: 6.8 g/dL (ref 6.0–8.3)
Total Bilirubin: 0.3 mg/dL (ref 0.2–1.2)

## 2013-10-27 LAB — LIPID PANEL
CHOL/HDL RATIO: 2.6 ratio
CHOLESTEROL: 257 mg/dL — AB (ref 0–200)
HDL: 97 mg/dL (ref >39)
LDL CALC: 146 mg/dL — AB (ref 0–99)
TRIGLYCERIDES: 71 mg/dL (ref <150)
VLDL: 14 mg/dL (ref 0–40)

## 2013-10-27 LAB — HEMOGLOBIN A1C
HEMOGLOBIN A1C: 5.4 % (ref <5.7)
Mean Plasma Glucose: 108 mg/dL (ref <117)

## 2013-10-27 LAB — TSH: TSH: 0.729 u[IU]/mL (ref 0.350–4.500)

## 2013-10-27 LAB — MAGNESIUM: MAGNESIUM: 2.2 mg/dL (ref 1.5–2.5)

## 2013-10-27 NOTE — Patient Instructions (Signed)

## 2013-10-27 NOTE — Progress Notes (Signed)
Patient ID: Laura Maynard, female   DOB: 02/12/1946, 68 y.o.   MRN: [id number]  Annual Screening Comprehensive Examination  This very nice 68 y.o. DBF presents for complete physical.  Patient has been followed for HTN, Prediabetes, Hyperlipidemia, and Vitamin D Deficiency.    HTN predates many years and is monitored expectantly as patient has an aversion to take any medications regularly. Patient's BP has been controlled at home. Today's BP: 140/78 mmHg. Patient denies any cardiac symptoms as chest pain, palpitations, shortness of breath, dizziness or ankle swelling.   Patient's hyperlipidemia is not controlled with diet and she alleges intolerance to statins and zetia. Patient denies myalgias or other medication SE's. Last cholesterol last visit was 281, triglycerides 49, HDL 101 and LDL 170.     Patient has been monitored forprediabetes/insulin resistance with last A1c  4.6% in July 2014. Patient denies reactive hypoglycemic symptoms, visual blurring, diabetic polys, or paresthesias.    Finally, patient has history of Vitamin D Deficiency 26 in Oct 2014.       Medication List       ALEVE PO  Take 1 tablet by mouth as needed.     aspirin 81 MG tablet  Take 81 mg by mouth daily.     Butalbital-APAP-Caffeine 50-325-40 MG per capsule  Take 1 capsule by mouth every 4 (four) hours as needed.     cholecalciferol 1000 UNITS tablet  Commonly known as:  VITAMIN D  Take 1,000 Units by mouth daily.     meclizine 25 MG tablet  Commonly known as:  ANTIVERT  Take 25 mg by mouth daily. PRN        Allergies  Allergen Reactions  . Penicillins Hives  . Pravastatin     Myalgia  . Sulfa Antibiotics Hives  . Zetia [Ezetimibe]     Myalgia    Past Medical History  Diagnosis Date  . Cataract   . Hyperlipidemia   . Migraine   . Blind right eye     legally  . Sarcoidosis, lung 15 YRS AGO    NO TREATMENTS  . Neuropathy   . Hypertension   . Prediabetes   . Vitamin D deficiency   .  Anemia     Past Surgical History  Procedure Laterality Date  . Eye surgery      right eye  . Back surgery    . Nasal septum surgery    . Total abdominal hysterectomy    . Knee arthroscopy    . Colonoscopy  2006; 03/28/11    2 small adenomas; normal    Family History  Problem Relation Age of Onset  . Lung cancer Sister 84  . Throat cancer Brother 4  . Colon cancer Neg Hx   . COPD Sister   . Diabetes Brother   . Stroke Mother   . Emphysema Father     History  Substance Use Topics  . Smoking status: Never Smoker   . Smokeless tobacco: Never Used  . Alcohol Use: No     Comment: RARE    ROS Constitutional: Denies fever, chills, weight loss/gain, headaches, insomnia, fatigue, night sweats, and change in appetite. Eyes: Denies redness, blurred vision, diplopia, discharge, itchy, watery eyes.  ENT: Denies discharge, congestion, post nasal drip, epistaxis, sore throat, earache, hearing loss, dental pain, Tinnitus, Vertigo, Sinus pain, snoring.  Cardio: Denies chest pain, palpitations, irregular heartbeat, syncope, dyspnea, diaphoresis, orthopnea, PND, claudication, edema Respiratory: denies cough, dyspnea, DOE, pleurisy, hoarseness, laryngitis, wheezing.  Gastrointestinal: Denies  dysphagia, heartburn, reflux, water brash, pain, cramps, nausea, vomiting, bloating, diarrhea, constipation, hematemesis, melena, hematochezia, jaundice, hemorrhoids Genitourinary: Denies dysuria, frequency, urgency, nocturia, hesitancy, discharge, hematuria, flank pain Breast:Breast lumps, nipple discharge, bleeding.  Musculoskeletal: Denies arthralgia, myalgia, stiffness, Jt. Swelling, pain, limp, and strain/sprain. Skin: Denies puritis, rash, hives, warts, acne, eczema, changing in skin lesion Neuro: No weakness, tremor, incoordination, spasms, paresthesia, pain Psychiatric: Denies confusion, memory loss, sensory loss Endocrine: Denies change in weight, skin, hair change, nocturia, and paresthesia,  diabetic polys, visual blurring, hyper / hypo glycemic episodes.  Heme/Lymph: No excessive bleeding, bruising, enlarged lymph nodes.  BP: 140/78  Pulse: 72  Temp: 97.9 F (36.6 C)  Resp: 16    Estimated body mass index is 25.06 kg/(m^2) as calculated from the following:   Height as of this encounter: 5' 6.5" (1.689 m).   Weight as of this encounter: 157 lb 9.6 oz (71.487 kg).  Physical Exam General Appearance: Well nourished, in no apparent distress. Eyes: PERRLA, EOMs, conjunctiva no swelling or erythema, normal fundi and vessels. Sinuses: No frontal/maxillary tenderness ENT/Mouth: EACs patent / TMs  nl. Nares clear without erythema, swelling, mucoid exudates. Oral hygiene is good. No erythema, swelling, or exudate. Tongue normal, non-obstructing. Tonsils not swollen or erythematous. Hearing normal.  Neck: Supple, thyroid normal. No bruits, nodes or JVD. Respiratory: Respiratory effort normal.  BS equal and clear bilateral without rales, rhonci, wheezing or stridor. Cardio: Heart sounds are normal with regular rate and rhythm and no murmurs, rubs or gallops. Peripheral pulses are normal and equal bilaterally without edema. No aortic or femoral bruits. Chest: symmetric with normal excursions and percussion. Breasts: Symmetric, without lumps, nipple discharge, retractions, or fibrocystic changes.  Abdomen: Flat, soft, with bowl sounds. Nontender, no guarding, rebound, hernias, masses, or organomegaly.  Lymphatics: Non tender without lymphadenopathy.  Genitourinary:  Musculoskeletal: Full ROM all peripheral extremities, joint stability, 5/5 strength, and normal gait. Skin: Warm and dry without rashes, lesions, cyanosis, clubbing or  ecchymosis.  Neuro: Cranial nerves intact, reflexes equal bilaterally. Normal muscle tone, no cerebellar symptoms. Sensation intact.  Pysch: Awake and oriented X 3, normal affect, Insight and Judgment appropriate.   Assessment and Plan  1. Annual Screening  Examination 2. Hypertension  3. Hyperlipidemia 4. Pre Diabetes 5. Vitamin D Deficiency  Continue prudent diet as discussed, weight control, BP monitoring, regular exercise, and medications. Discussed med's effects and SE's. Screening labs and tests as requested with regular follow-up as recommended.

## 2013-10-28 LAB — VITAMIN D 25 HYDROXY (VIT D DEFICIENCY, FRACTURES): Vit D, 25-Hydroxy: 27 ng/mL — ABNORMAL LOW (ref 30–89)

## 2013-10-28 LAB — URINALYSIS, MICROSCOPIC ONLY
Bacteria, UA: NONE SEEN
Casts: NONE SEEN
Crystals: NONE SEEN
SQUAMOUS EPITHELIAL / LPF: NONE SEEN

## 2013-10-28 LAB — MICROALBUMIN / CREATININE URINE RATIO
CREATININE, URINE: 102.7 mg/dL
MICROALB UR: 0.78 mg/dL (ref 0.00–1.89)
MICROALB/CREAT RATIO: 7.6 mg/g (ref 0.0–30.0)

## 2013-10-28 LAB — INSULIN, FASTING: INSULIN FASTING, SERUM: 5 u[IU]/mL (ref 3–28)

## 2013-11-11 ENCOUNTER — Other Ambulatory Visit (INDEPENDENT_AMBULATORY_CARE_PROVIDER_SITE_OTHER): Payer: Medicare Other | Admitting: *Deleted

## 2013-11-11 DIAGNOSIS — Z1212 Encounter for screening for malignant neoplasm of rectum: Secondary | ICD-10-CM | POA: Diagnosis not present

## 2013-11-11 LAB — POC HEMOCCULT BLD/STL (HOME/3-CARD/SCREEN)
Card #3 Fecal Occult Blood, POC: NEGATIVE
FECAL OCCULT BLD: NEGATIVE
FECAL OCCULT BLD: NEGATIVE

## 2013-11-24 ENCOUNTER — Other Ambulatory Visit: Payer: Self-pay | Admitting: Internal Medicine

## 2013-11-29 ENCOUNTER — Telehealth: Payer: Self-pay | Admitting: *Deleted

## 2013-11-29 NOTE — Telephone Encounter (Signed)
Message copied by Waunita Schooner on Mon Nov 29, 2013 12:09 PM ------      Message from: Kelby Aline R      Created: Wed Nov 24, 2013  8:02 AM       sHE NEEDS APPOINTMENT IN mAY FOR RECHECK CHOLESTEROL please ------

## 2014-01-18 ENCOUNTER — Telehealth: Payer: Self-pay | Admitting: *Deleted

## 2014-01-18 ENCOUNTER — Other Ambulatory Visit: Payer: Self-pay

## 2014-01-18 MED ORDER — SUMATRIPTAN SUCCINATE 6 MG/0.5ML ~~LOC~~ SOTJ: SUBCUTANEOUS | Status: DC

## 2014-01-18 NOTE — Telephone Encounter (Signed)
Will have up from for the patient to pick up.

## 2014-01-18 NOTE — Telephone Encounter (Signed)
Reordered Marney Setting, tried to call pharmacy in regards to RX and coupon for patient but wa unable to continue to hold (on hold for pharmacist 23min at [facility]

## 2014-01-18 NOTE — Telephone Encounter (Signed)
SUMAVEL DOSE PRO  6MG / 0.5MG   #6   WRITTEN RX PT WANTS TO PICK UP A RX & A COUPON

## 2014-01-19 ENCOUNTER — Other Ambulatory Visit: Payer: Self-pay | Admitting: Emergency Medicine

## 2014-02-08 ENCOUNTER — Encounter: Payer: Self-pay | Admitting: Internal Medicine

## 2014-02-08 ENCOUNTER — Ambulatory Visit (INDEPENDENT_AMBULATORY_CARE_PROVIDER_SITE_OTHER): Payer: Medicare Other | Admitting: Internal Medicine

## 2014-02-08 VITALS — BP 116/74 | HR 76 | Temp 97.8°F | Resp 18 | Ht 66.75 in | Wt 154.0 lb

## 2014-02-08 DIAGNOSIS — Z79899 Other long term (current) drug therapy: Secondary | ICD-10-CM | POA: Diagnosis not present

## 2014-02-08 DIAGNOSIS — E559 Vitamin D deficiency, unspecified: Secondary | ICD-10-CM | POA: Diagnosis not present

## 2014-02-08 DIAGNOSIS — E785 Hyperlipidemia, unspecified: Secondary | ICD-10-CM | POA: Insufficient documentation

## 2014-02-08 DIAGNOSIS — R7303 Prediabetes: Secondary | ICD-10-CM

## 2014-02-08 DIAGNOSIS — I10 Essential (primary) hypertension: Secondary | ICD-10-CM

## 2014-02-08 DIAGNOSIS — E782 Mixed hyperlipidemia: Secondary | ICD-10-CM | POA: Insufficient documentation

## 2014-02-08 DIAGNOSIS — R7309 Other abnormal glucose: Secondary | ICD-10-CM | POA: Diagnosis not present

## 2014-02-08 DIAGNOSIS — R51 Headache: Secondary | ICD-10-CM

## 2014-02-08 LAB — HEPATIC FUNCTION PANEL
ALBUMIN: 4.4 g/dL (ref 3.5–5.2)
ALK PHOS: 88 U/L (ref 39–117)
ALT: 12 U/L (ref 0–35)
AST: 18 U/L (ref 0–37)
Bilirubin, Direct: 0.1 mg/dL (ref 0.0–0.3)
Indirect Bilirubin: 0.3 mg/dL (ref 0.2–1.2)
TOTAL PROTEIN: 6.6 g/dL (ref 6.0–8.3)
Total Bilirubin: 0.4 mg/dL (ref 0.2–1.2)

## 2014-02-08 LAB — LIPID PANEL
CHOLESTEROL: 254 mg/dL — AB (ref 0–200)
HDL: 101 mg/dL (ref >39)
LDL Cholesterol: 144 mg/dL — ABNORMAL HIGH (ref 0–99)
Total CHOL/HDL Ratio: 2.5 Ratio
Triglycerides: 46 mg/dL (ref <150)
VLDL: 9 mg/dL (ref 0–40)

## 2014-02-08 LAB — BASIC METABOLIC PANEL WITH GFR
BUN: 12 mg/dL (ref 6–23)
CALCIUM: 9.5 mg/dL (ref 8.4–10.5)
CHLORIDE: 107 meq/L (ref 96–112)
CO2: 26 mEq/L (ref 19–32)
CREATININE: 0.79 mg/dL (ref 0.50–1.10)
GFR, EST NON AFRICAN AMERICAN: 78 mL/min
GFR, Est African American: >89 mL/min
Glucose, Bld: 88 mg/dL (ref 70–99)
Potassium: 4.5 mEq/L (ref 3.5–5.3)
Sodium: 142 mEq/L (ref 135–145)

## 2014-02-08 LAB — MAGNESIUM: Magnesium: 2.2 mg/dL (ref 1.5–2.5)

## 2014-02-08 LAB — HEMOGLOBIN A1C
HEMOGLOBIN A1C: 5.6 % (ref <5.7)
MEAN PLASMA GLUCOSE: 114 mg/dL (ref <117)

## 2014-02-08 LAB — CBC WITH DIFFERENTIAL/PLATELET
Basophils Absolute: 0 10*3/uL (ref 0.0–0.1)
Basophils Relative: 1 % (ref 0–1)
Eosinophils Absolute: 0 10*3/uL (ref 0.0–0.7)
Eosinophils Relative: 1 % (ref 0–5)
HCT: 35 % — ABNORMAL LOW (ref 36.0–46.0)
Hemoglobin: 11.9 g/dL — ABNORMAL LOW (ref 12.0–15.0)
LYMPHS ABS: 1.6 10*3/uL (ref 0.7–4.0)
LYMPHS PCT: 34 % (ref 12–46)
MCH: 27 pg (ref 26.0–34.0)
MCHC: 34 g/dL (ref 30.0–36.0)
MCV: 79.5 fL (ref 78.0–100.0)
Monocytes Absolute: 0.5 10*3/uL (ref 0.1–1.0)
Monocytes Relative: 11 % (ref 3–12)
NEUTROS PCT: 53 % (ref 43–77)
Neutro Abs: 2.4 10*3/uL (ref 1.7–7.7)
PLATELETS: 368 10*3/uL (ref 150–400)
RBC: 4.4 MIL/uL (ref 3.87–5.11)
RDW: 13.6 % (ref 11.5–15.5)
WBC: 4.6 10*3/uL (ref 4.0–10.5)

## 2014-02-08 LAB — TSH: TSH: 0.079 u[IU]/mL — ABNORMAL LOW (ref 0.350–4.500)

## 2014-02-08 MED ORDER — ONDANSETRON 8 MG PO TBDP: ORAL_TABLET | ORAL | Status: DC

## 2014-02-08 MED ORDER — RIZATRIPTAN BENZOATE 10 MG PO TBDP: 10.0000 mg | ORAL_TABLET | ORAL | Status: DC | PRN

## 2014-02-08 NOTE — Progress Notes (Signed)
Patient ID: Laura Maynard, female   DOB: 23-Dec-1945, 68 y.o.   MRN: [id number]    This very nice 68 y.o. DBF presents for 3 month follow up with Labile Hypertension, Mixed Headaches, Hyperlipidemia, Pre-Diabetes and Vitamin D Deficiency.    Patient has labile HTN and is monitored expectantly. . BP has been controlled and today's BP: 116/74 mmHg. Patient denies any cardiac type chest pain, palpitations, dyspnea/orthopnea/PND, dizziness, claudication, or dependent edema.   Hyperlipidemia is controlled with diet & meds. Last Cholesterol was 254, Triglycerides were  71, HDL  97  and LDL 146 in Jan 2015 and patient continues to refuse taking any meds for cholesterol. She doesstate she has lost 14 # in the last 3-4 months by better low cholesterol diet.   Further, Patient has history of Vitamin D Deficiency of less than 10 in 2012 with last vitamin D of 27 in Jan 2015. Patient supplements vitamin D without any suspected side-effects.    Medication List       ALEVE PO  Take 1 tablet by mouth as needed.     aspirin 81 MG tablet  Take 81 mg by mouth daily.     butalbital-acetaminophen-caffeine 50-325-40 MG per tablet  Commonly known as:  FIORICET, ESGIC  TAKE 1-2 TABLETS BY MOUTH EVERY 4 HOURS AS NEEDED FOR HEADACHE. MAX 6 TABLETS DAILY     cholecalciferol 1000 UNITS tablet  Commonly known as:  VITAMIN D  Take 1,000 Units by mouth daily.     meclizine 25 MG tablet  Commonly known as:  ANTIVERT  Take 25 mg by mouth daily. PRN     ondansetron 8 MG disintegrating tablet  Commonly known as:  ZOFRAN ODT  Dissolve 1 tablet under the tongue 3 x day as needed for severe nausea     rizatriptan 10 MG disintegrating tablet  Commonly known as:  MAXALT-MLT  Take 1 tablet (10 mg total) by mouth as needed for migraine. May repeat in 2 hours if needed     SUMAtriptan Succinate 6 MG/0.5ML Sotj  Inject subcutaneous into skin as needed for migraines       Allergies  Allergen Reactions  . Penicillins  Hives  . Pravastatin     Myalgia  . Sulfa Antibiotics Hives  . Zetia [Ezetimibe]     Myalgia    PMHx:   Past Medical History  Diagnosis Date  . Cataract   . Hyperlipidemia   . Migraine   . Blind right eye     legally  . Sarcoidosis, lung 15 YRS AGO    NO TREATMENTS  . Neuropathy   . Hypertension   . Prediabetes   . Vitamin D deficiency   . Anemia     FHx:    Reviewed / unchanged  SHx:    Reviewed / unchanged   Systems Review: Constitutional: Denies fever, chills, wt changes, headaches, insomnia, fatigue, night sweats, change in appetite. Eyes: Denies redness, blurred vision, diplopia, discharge, itchy, watery eyes.  ENT: Denies discharge, congestion, post nasal drip, epistaxis, sore throat, earache, hearing loss, dental pain, tinnitus, vertigo, sinus pain, snoring.  CV: Denies chest pain, palpitations, irregular heartbeat, syncope, dyspnea, diaphoresis, orthopnea, PND, claudication or edema. Respiratory: denies cough, dyspnea, DOE, pleurisy, hoarseness, laryngitis, wheezing.  Gastrointestinal: Denies dysphagia, odynophagia, heartburn, reflux, water brash, abdominal pain or cramps, nausea, vomiting, bloating, diarrhea, constipation, hematemesis, melena, hematochezia  or hemorrhoids. Genitourinary: Denies dysuria, frequency, urgency, nocturia, hesitancy, discharge, hematuria or flank pain. Musculoskeletal: Denies arthralgias, myalgias, stiffness, jt.  swelling, pain, limping or strain/sprain.  Skin: Denies pruritus, rash, hives, warts, acne, eczema or change in skin lesion(s). Neuro: No weakness, tremor, incoordination, spasms, paresthesia or pain. Psychiatric: Denies confusion, memory loss or sensory loss. Endo: Denies change in weight, skin or hair change.  Heme/Lymph: No excessive bleeding, bruising or enlarged lymph nodes.  Exam:  BP 116/74  Pulse 76  Temp 97.8 F   Resp 18  Ht 5' 6.75"   Wt 154 lb   BMI 24.31 kg/m2  Appears well nourished - in no  distress. Eyes: PERRLA, EOMs, conjunctiva no swelling or erythema. Sinuses: No frontal/maxillary tenderness ENT/Mouth: EAC's clear, TM's nl w/o erythema, bulging. Nares clear w/o erythema, swelling, exudates. Oropharynx clear without erythema or exudates. Oral hygiene is good. Tongue normal, non obstructing. Hearing intact.  Neck: Supple. Thyroid nl. Car 2+/2+ without bruits, nodes or JVD. Chest: Respirations nl with BS clear & equal w/o rales, rhonchi, wheezing or stridor.  Cor: Heart sounds normal w/ regular rate and rhythm without sig. murmurs, gallops, clicks, or rubs. Peripheral pulses normal and equal  without edema.  Abdomen: Soft & bowel sounds normal. Non-tender w/o guarding, rebound, hernias, masses, or organomegaly.  Lymphatics: Unremarkable.  Musculoskeletal: Full ROM all peripheral extremities, joint stability, 5/5 strength, and normal gait.  Skin: Warm, dry without exposed rashes, lesions or ecchymosis apparent.  Neuro: Cranial nerves intact, reflexes equal bilaterally. Sensory-motor testing grossly intact. Tendon reflexes grossly intact.  Pysch: Alert & oriented x 3. Insight and judgement nl & appropriate. No ideations.  Assessment and Plan:  1. Hypertension - Continue monitor blood pressure at home. Continue diet/meds same.  2. Hyperlipidemia - Continue diet, exercise,& lifestyle modifications. Continue monitor periodic cholesterol/liver & renal functions   3. Pre-diabetes - Continue diet, exercise, lifestyle modifications. Monitor appropriate labs. . 4. Vitamin D Deficiency - Continue supplementation.  5. Poor compliance  Recommended regular exercise, BP monitoring, weight control, and discussed med and SE's. Recommended labs to assess and monitor clinical status. Further disposition pending results of labs.

## 2014-02-09 LAB — INSULIN, FASTING: Insulin fasting, serum: 4 u[IU]/mL (ref 3–28)

## 2014-02-09 LAB — VITAMIN D 25 HYDROXY (VIT D DEFICIENCY, FRACTURES): VIT D 25 HYDROXY: 36 ng/mL (ref 30–89)

## 2014-02-15 ENCOUNTER — Other Ambulatory Visit: Payer: Self-pay | Admitting: *Deleted

## 2014-02-15 MED ORDER — SUMATRIPTAN SUCCINATE 6 MG/0.5ML ~~LOC~~ SOTJ: SUBCUTANEOUS | Status: DC

## 2014-02-15 NOTE — Telephone Encounter (Signed)
Patient called with c/o Maxalt Rx causing nausea and vomiting.. States she wants written Rx for Sumavel Dose pro, even though insurance will not cover Rx patient states she will pay cash price for Rx.  States that is the only Rx that helps her headache symptoms.

## 2014-03-09 ENCOUNTER — Other Ambulatory Visit: Payer: Self-pay | Admitting: Emergency Medicine

## 2014-03-16 DIAGNOSIS — H31019 Macula scars of posterior pole (postinflammatory) (post-traumatic), unspecified eye: Secondary | ICD-10-CM | POA: Diagnosis not present

## 2014-03-24 ENCOUNTER — Telehealth: Payer: Self-pay | Admitting: Neurology

## 2014-03-24 NOTE — Telephone Encounter (Signed)
Patient calling to state she has been to our office before and has seen two providers, Dr. Krista Blue and Dr. Leta Baptist. Patient is wanting to schedule an appointment with Dr. Leta Baptist soon because she is seeing flashing lights and her eye doctor seems to think everything is fine, but patient is still experiencing this. Please return call to patient and advise.

## 2014-03-24 NOTE — Telephone Encounter (Signed)
Patient calling back.  Very anxious.  States it is okay to leave a message with the date and time for her to come in.

## 2014-03-25 ENCOUNTER — Other Ambulatory Visit: Payer: Self-pay

## 2014-03-25 DIAGNOSIS — R42 Dizziness and giddiness: Secondary | ICD-10-CM

## 2014-03-25 NOTE — Telephone Encounter (Signed)
Pt called back states she hasn't seen Dr. Leta Baptist at least 3-4 years but was for the problem she is wanting to come back and see him on. I did advise her that this needed to be a referral due to over 3 years and she is calling her Dr. To do the referral I also sent pt to Diane so that she could let Diane know what is going on and that she will have her Dr. Joylene Igo over her referral. This is a problem that Dr. Krista Blue has not seen pt on but was seen by Dr. Leta Baptist. Advised pt once referral has been received in our office we would give her a call to set up an apt for this matter. Thanks

## 2014-03-30 ENCOUNTER — Ambulatory Visit (INDEPENDENT_AMBULATORY_CARE_PROVIDER_SITE_OTHER): Payer: Medicare Other | Admitting: Internal Medicine

## 2014-03-30 VITALS — BP 122/78 | HR 80 | Temp 97.7°F | Resp 16 | Ht 67.0 in | Wt 155.4 lb

## 2014-03-30 DIAGNOSIS — E060 Acute thyroiditis: Secondary | ICD-10-CM

## 2014-03-30 LAB — TSH: TSH: 0.042 u[IU]/mL — AB (ref 0.350–4.500)

## 2014-03-30 NOTE — Patient Instructions (Signed)
Hyperthyroidism  The thyroid is a large gland located in the lower front part of your neck. The thyroid helps control metabolism. Metabolism is how your body uses food. It controls metabolism with the hormone thyroxine. When the thyroid is overactive, it produces too much hormone. When this happens, these following problems may occur:   · Nervousness  · Heat intolerance  · Weight loss (in spite of increase food intake)  · Diarrhea  · Change in hair or skin texture  · Palpitations (heart skipping or having extra beats)  · Tachycardia (rapid heart rate)  · Loss of menstruation (amenorrhea)  · Shaking of the hands  CAUSES  · Grave's Disease (the immune system attacks the thyroid gland). This is the most common cause.  · Inflammation of the thyroid gland.  · Tumor (usually benign) in the thyroid gland or elsewhere.  · Excessive use of thyroid medications (both prescription and 'natural').  · Excessive ingestion of Iodine.  DIAGNOSIS   To prove hyperthyroidism, your caregiver may do blood tests and ultrasound tests. Sometimes the signs are hidden. It may be necessary for your caregiver to watch this illness with blood tests, either before or after diagnosis and treatment.  TREATMENT  Short-term treatment  There are several treatments to control symptoms. Drugs called beta blockers may give some relief. Drugs that decrease hormone production will provide temporary relief in many people. These measures will usually not give permanent relief.  Definitive therapy  There are treatments available which can be discussed between you and your caregiver which will permanently treat the problem. These treatments range from surgery (removal of the thyroid), to the use of radioactive iodine (destroys the thyroid by radiation), to the use of antithyroid drugs (interfere with hormone synthesis). The first two treatments are permanent and usually successful. They most often require hormone replacement therapy for life. This is because  it is impossible to remove or destroy the exact amount of thyroid required to make a person euthyroid (normal).  HOME CARE INSTRUCTIONS   See your caregiver if the problems you are being treated for get worse. Examples of this would be the problems listed above.  SEEK MEDICAL CARE IF:  Your general condition worsens.  MAKE SURE YOU:   · Understand these instructions.  · Will watch your condition.  · Will get help right away if you are not doing well or get worse.  Document Released: 09/16/2005 Document Revised: 12/09/2011 Document Reviewed: 01/28/2007  [employer]® Patient Information ©2015 [employer]. This information is not intended to replace advice given to you by your health care provider. Make sure you discuss any questions you have with your health care provider.

## 2014-03-31 ENCOUNTER — Encounter: Payer: Self-pay | Admitting: Internal Medicine

## 2014-03-31 NOTE — Progress Notes (Signed)
Subjective:    Patient ID: Laura Maynard, female    DOB: [birth year], 68 y.o.   MRN: [id number]  HPI Patient is a 68 yo DBF being followed for labile HTN, Hyperlipidemia, PreDiabetes who is being seen today for recheck of low TSH 0.079 about 6 weeks ago suspect for a thyroiditis. Patient is asymptomatic wrt Sx's of excess thyroid.  She does have modrately severe hyperlipidemia and has been resistant to considering taking medications for same. Her last Lipid Profile was severely elevated. Lab Results  Component Value Date   CHOL 254* 02/08/2014   HDL 101 02/08/2014   LDLCALC 144* 02/08/2014   TRIG 46 02/08/2014   CHOLHDL 2.5 02/08/2014   Current Outpatient Prescriptions on File Prior to Visit  Medication Sig Dispense Refill  . aspirin 81 MG tablet Take 81 mg by mouth daily.        . butalbital-acetaminophen-caffeine (FIORICET, ESGIC) 50-325-40 MG per tablet TAKE 1 TO 2 TABLETS BY MOUTH EVERY 4 HOURS. MAX OF 6 TABLETS PER DAY  50 tablet  0  . cholecalciferol (VITAMIN D) 1000 UNITS tablet Take 1,000 Units by mouth daily.      . meclizine (ANTIVERT) 25 MG tablet Take 25 mg by mouth daily. PRN      . Naproxen Sodium (ALEVE PO) Take 1 tablet by mouth as needed.        . SUMAtriptan Succinate 6 MG/0.5ML SOTJ Inject subcutaneous into skin as needed for migraines  6 Device  0  . ondansetron (ZOFRAN ODT) 8 MG disintegrating tablet Dissolve 1 tablet under the tongue 3 x day as needed for severe nausea  6 tablet  0  . rizatriptan (MAXALT-MLT) 10 MG disintegrating tablet Take 1 tablet (10 mg total) by mouth as needed for migraine. May repeat in 2 hours if needed  10 tablet  0   No current facility-administered medications on file prior to visit.   Allergies  Allergen Reactions  . Penicillins Hives  . Pravastatin     Myalgia  . Sulfa Antibiotics Hives  . Zetia [Ezetimibe]     Myalgia   Review of Systems In addition to the HPI above,  No Fever-chills,  No Headache, No changes with Vision or  hearing,  No problems swallowing food or Liquids,  No Chest pain or productive Cough or Shortness of Breath,  No Abdominal pain, No Nausea or Vomitting, Bowel movements are regular,  No Blood in stool or Urine,  No dysuria,  No new skin rashes or bruises,  No new joints pains-aches,  No new weakness, tingling, numbness in any extremity,  No recent weight loss,  No polyuria, polydypsia or polyphagia,  No significant Mental Stressors.  A full 10 point Review of Systems was done, except as stated above, all other Review of Systems were negative  Objective:   Physical Exam BP 122/78  P 80  T 97.7 F  Resp 16  Ht 5\' 7"    Wt 155 lb 6.4 oz   BMI 24.33 kg/m2  HEENT - Eac's patent. TM's Nl. EOM's full. PERRLA. NasoOroPharynx clear. Neck - supple. Nl Thyroid. Carotids 2+ & No bruits, nodes, JVD Chest - Clear equal BS w/o Rales, rhonchi, wheezes. Cor - Nl HS. RRR w/o sig MGR. PP 1(+). No edema. Abd - No palpable organomegaly, masses or tenderness. BS nl. MS- FROM w/o deformities. Muscle power, tone and bulk Nl. Gait Nl. Neuro - No obvious Cr N abnormalities. Sensory, motor and Cerebellar functions appear Nl w/o focal abnormalities.  Psyche - Mental status normal & appropriate.  No delusions, ideations or obvious mood abnormalities.  Assessment & Plan:   1. Acute thyroiditis - TSH  2. Hyperlipidemia - Diet advised -Patient once again refuses meds.

## 2014-04-04 ENCOUNTER — Ambulatory Visit (INDEPENDENT_AMBULATORY_CARE_PROVIDER_SITE_OTHER): Payer: Medicare Other | Admitting: Diagnostic Neuroimaging

## 2014-04-04 ENCOUNTER — Encounter: Payer: Self-pay | Admitting: Diagnostic Neuroimaging

## 2014-04-04 VITALS — BP 121/76 | HR 84 | Temp 97.3°F | Ht 67.0 in | Wt 155.4 lb

## 2014-04-04 DIAGNOSIS — H539 Unspecified visual disturbance: Secondary | ICD-10-CM | POA: Diagnosis not present

## 2014-04-04 DIAGNOSIS — G43109 Migraine with aura, not intractable, without status migrainosus: Secondary | ICD-10-CM | POA: Diagnosis not present

## 2014-04-04 DIAGNOSIS — G43809 Other migraine, not intractable, without status migrainosus: Secondary | ICD-10-CM

## 2014-04-04 NOTE — Patient Instructions (Signed)
Monitor symptoms. 

## 2014-04-04 NOTE — Progress Notes (Signed)
[facility]  PATIENT: Laura Maynard DOB: 04/01/1946  REFERRING CLINICIAN: Melford Aase HISTORY FROM: patient  REASON FOR VISIT: new consult   HISTORICAL  CHIEF COMPLAINT:  Chief Complaint  Patient presents with  . Dizziness    flashing lights, HA    HISTORY OF PRESENT ILLNESS:   UPDATE 04/04/14: Patient presents for a new symptom, visual disturbance. 68 year old female with history of migraine headaches, pulmonary sarcoidosis, hypercholesterolemia, who is been having intermittent visual disturbance in right visual field for past 2 years. Patient describes seeing a flash of "C - shape" for just one second. This happens up to 10 times per day. She can't tell for sure if this is coming from her right or left eye. The last 2 weeks she is seen a similar smaller visual disturbance in her left visual field. Patient is seen her eye doctor who found no specific intraocular pathology or explanation. These episodes are not associated with her typical migraines. Patient continues to have one migraine every 3-4 months. He seemed to be fairly well-controlled sumatriptan.  Patient also had episode of dizziness, nausea, vomiting, migraine, in 2013, went to the emergency room had CT and MRI which are unremarkable. Patient has been using meclizine as needed for vertigo spells.  PRIOR HPI (05/08/10): 68 year old right-handed female with history of hypercholesterolemia, pulmonary sarcoidosis (not active), migraine headaches presenting for evaluation of worsening headaches. Patient has a long history of migraine headaches from her teenage years. Initially headaches were unilateral, throbbing, associated with nausea and vomiting as well as photophobia and phonophobia. No aura or visual symptoms. She hasn't noted specific triggers related to strong perfume or specific kinds of foods in the past. These would trigger migraine headaches during the daytime. She typically had one severe migraine headache  every 3 months. She was seeking treatment by visiting the emergency room or her primary care doctor and receiving various injections. 3 months ago she started on Sumavel dosepro which did work to [employer] the severe migraine attacks.  In addition she was using Fioricet tabs p.r.n. mild migraine headaches. She has been on Fioricet for many years. In the last 2 months she has increased the frequency of Fioricet up to 3 days per week (one tab per day).  In the past 2 months patient has had mild unilateral headaches which she wakes up with, without nausea or vomiting, for which she takes a Fioricet and within 20 minutes her headaches go away. Patient was diagnoses with pulmonary sarcoidosis in the 1990s (incidental finding on CXR, then biopsy proven), treated with steroids for approximately one year. Followup chest x-ray showed no signs of active disease.  No current pulmonary symptoms.  No current tx for sarcoidosis.   REVIEW OF SYSTEMS: Full 14 system review of systems performed and notable only for dizziness headache freq urination insomnia restless legs ringing in ears light sens.  ALLERGIES: Allergies  Allergen Reactions  . Penicillins Hives  . Pravastatin     Myalgia  . Sulfa Antibiotics Hives  . Zetia [Ezetimibe]     Myalgia    HOME MEDICATIONS: Outpatient Prescriptions Prior to Visit  Medication Sig Dispense Refill  . aspirin 81 MG tablet Take 81 mg by mouth daily.        . butalbital-acetaminophen-caffeine (FIORICET, ESGIC) 50-325-40 MG per tablet TAKE 1 TO 2 TABLETS BY MOUTH EVERY 4 HOURS. MAX OF 6 TABLETS PER DAY  50 tablet  0  . cholecalciferol (VITAMIN D) 1000 UNITS tablet Take 1,000 Units by mouth daily.      Marland Kitchen  meclizine (ANTIVERT) 25 MG tablet Take 25 mg by mouth daily. PRN      . Naproxen Sodium (ALEVE PO) Take 1 tablet by mouth as needed.        . SUMAtriptan Succinate 6 MG/0.5ML SOTJ Inject subcutaneous into skin as needed for migraines  6 Device  0  . ondansetron (ZOFRAN ODT) 8  MG disintegrating tablet Dissolve 1 tablet under the tongue 3 x day as needed for severe nausea  6 tablet  0  . rizatriptan (MAXALT-MLT) 10 MG disintegrating tablet Take 1 tablet (10 mg total) by mouth as needed for migraine. May repeat in 2 hours if needed  10 tablet  0   No facility-administered medications prior to visit.    PAST MEDICAL HISTORY: Past Medical History  Diagnosis Date  . Cataract   . Hyperlipidemia   . Migraine   . Blind right eye     legally  . Sarcoidosis, lung 15 YRS AGO    NO TREATMENTS  . Neuropathy   . Hypertension   . Prediabetes   . Vitamin D deficiency   . Anemia     PAST SURGICAL HISTORY: Past Surgical History  Procedure Laterality Date  . Eye surgery  1964    right eye  . Back surgery  1970  . Nasal septum surgery  1998  . Total abdominal hysterectomy  2000  . Knee arthroscopy  2002  . Colonoscopy  2006; 03/28/11    2 small adenomas; normal    FAMILY HISTORY: Family History  Problem Relation Age of Onset  . Lung cancer Sister 66  . Throat cancer Brother 9  . Colon cancer Neg Hx   . COPD Sister   . Diabetes Brother   . Stroke Mother   . Emphysema Father     SOCIAL HISTORY:  History   Social History  . Marital Status: Divorced    Spouse Name: N/A    Number of Children: 0  . Years of Education: college   Occupational History  .      Retired   Social History Main Topics  . Smoking status: Never Smoker   . Smokeless tobacco: Never Used  . Alcohol Use: No     Comment: RARE  . Drug Use: No  . Sexual Activity: Not on file   Other Topics Concern  . Not on file   Social History Narrative   Patient is retired and lives at home alone. Patient  has college education.   Caffeine- two cups of caffeine daily.   Right handed.     PHYSICAL EXAM  Filed Vitals:   04/04/14 0943 04/04/14 0944  BP: 127/72 121/76  Pulse: 79 84  Temp:  97.3 F (36.3 C)  TempSrc:  Oral  Height: 5\' 7"  (1.702 m)   Weight: 155 lb 6.4 oz (70.489  kg)     Not recorded    Body mass index is 24.33 kg/(m^2).  GENERAL EXAM: Patient is in no distress; well developed, nourished and groomed; neck is supple  CARDIOVASCULAR: Regular rate and rhythm, no murmurs, no carotid bruits  NEUROLOGIC: MENTAL STATUS: awake, alert, oriented to person, place and time, recent and remote memory intact, normal attention and concentration, language fluent, comprehension intact, naming intact, fund of knowledge appropriate CRANIAL NERVE: RIGHT PUPIL IRREGULAR, NO REACTION; RIGHT EYE CAN SEE SHAPES AND SHASOWS (LEGALLY BLIND). LEFT EYE 20/40. No papilledema on fundoscopic exam IN LEFT EYE. LEFT PUPIL REACTIVE. Visual fields full to confrontation, extraocular muscles intact, no  nystagmus, facial sensation and strength symmetric, hearing intact, palate elevates symmetrically, uvula midline, shoulder shrug symmetric, tongue midline. MOTOR: normal bulk and tone, full strength in the BUE, BLE SENSORY: normal and symmetric to light touch, temperature, vibration COORDINATION: finger-nose-finger, fine finger movements normal REFLEXES: deep tendon reflexes present and symmetric GAIT/STATION: narrow based gait; romberg is negative    DIAGNOSTIC DATA (LABS, IMAGING, TESTING) - I reviewed patient records, labs, notes, testing and imaging myself where available.  Lab Results  Component Value Date   WBC 4.6 02/08/2014   HGB 11.9* 02/08/2014   HCT 35.0* 02/08/2014   MCV 79.5 02/08/2014   PLT 368 02/08/2014      Component Value Date/Time   NA 142 02/08/2014 1004   K 4.5 02/08/2014 1004   CL 107 02/08/2014 1004   CO2 26 02/08/2014 1004   GLUCOSE 88 02/08/2014 1004   BUN 12 02/08/2014 1004   CREATININE 0.79 02/08/2014 1004   CALCIUM 9.5 02/08/2014 1004   PROT 6.6 02/08/2014 1004   ALBUMIN 4.4 02/08/2014 1004   AST 18 02/08/2014 1004   ALT 12 02/08/2014 1004   ALKPHOS 88 02/08/2014 1004   BILITOT 0.4 02/08/2014 1004   GFRNONAA 78 02/08/2014 1004   GFRAA >89 02/08/2014 1004     Lab Results  Component Value Date   CHOL 254* 02/08/2014   HDL 101 02/08/2014   LDLCALC 144* 02/08/2014   TRIG 46 02/08/2014   CHOLHDL 2.5 02/08/2014   Lab Results  Component Value Date   HGBA1C 5.6 02/08/2014   No results found for this basename: VITAMINB12   Lab Results  Component Value Date   TSH 0.042* 03/30/2014    05/23/10 MRI BRAIN (with and without) - Essentially normal MRI brain (with and without contrast). Incidentally noted are small, bilateral parotid cysts.  These can be seen in Sjogren's disease, sarcoidosis or with benign simple cysts.  06/04/12 MRI BRAIN - unremarkable  06/04/12 MRA head - mild intracranial atherosclerosis    ASSESSMENT AND PLAN  68 y.o. year old female here with brief intermittent visual disturbances, likely migraine phenomenon. Neuro exam unremarkable. Will monitor symptoms.  Ddx: migraine variant, intra-ocular pathology, sarcoid, vascular  PLAN: - monitor symptoms; low threshold to check MRI brain if symptoms significantly worsen  Return in about 3 months (around 07/05/2014).    Penni Bombard, MD 12/04/6281, 15:17 AM Certified in Neurology, Neurophysiology and Neuroimaging  [facility] [address], [place], [place] [address] [phone]

## 2014-05-02 ENCOUNTER — Ambulatory Visit: Payer: Medicare Other | Admitting: *Deleted

## 2014-05-02 DIAGNOSIS — E039 Hypothyroidism, unspecified: Secondary | ICD-10-CM | POA: Diagnosis not present

## 2014-05-02 DIAGNOSIS — R6889 Other general symptoms and signs: Secondary | ICD-10-CM | POA: Diagnosis not present

## 2014-05-02 NOTE — Progress Notes (Signed)
Patient ID: Laura Maynard, female   DOB: September 09, 1946, 68 y.o.   MRN: [id number] Patient returned for a lab to check her TSH.  She is on no thyroid med at this time.

## 2014-05-03 ENCOUNTER — Other Ambulatory Visit: Payer: Self-pay | Admitting: Internal Medicine

## 2014-05-03 LAB — TSH: TSH: 0.009 u[IU]/mL — ABNORMAL LOW (ref 0.350–4.500)

## 2014-05-04 ENCOUNTER — Ambulatory Visit: Payer: Self-pay | Admitting: Internal Medicine

## 2014-05-04 DIAGNOSIS — Z1231 Encounter for screening mammogram for malignant neoplasm of breast: Secondary | ICD-10-CM | POA: Diagnosis not present

## 2014-05-11 ENCOUNTER — Other Ambulatory Visit: Payer: Self-pay

## 2014-05-11 ENCOUNTER — Other Ambulatory Visit: Payer: Self-pay | Admitting: Internal Medicine

## 2014-05-11 DIAGNOSIS — E0590 Thyrotoxicosis, unspecified without thyrotoxic crisis or storm: Secondary | ICD-10-CM

## 2014-05-18 ENCOUNTER — Ambulatory Visit: Payer: Self-pay | Admitting: Internal Medicine

## 2014-05-19 ENCOUNTER — Ambulatory Visit (HOSPITAL_COMMUNITY): Payer: Medicare Other

## 2014-05-19 ENCOUNTER — Ambulatory Visit (HOSPITAL_COMMUNITY)
Admission: RE | Admit: 2014-05-19 | Discharge: 2014-05-19 | Disposition: A | Discharge status: Home / Self Care | Payer: Medicare Other | Source: Ambulatory Visit | Attending: Internal Medicine | Admitting: Internal Medicine

## 2014-05-19 DIAGNOSIS — R946 Abnormal results of thyroid function studies: Secondary | ICD-10-CM | POA: Insufficient documentation

## 2014-05-19 DIAGNOSIS — E0590 Thyrotoxicosis, unspecified without thyrotoxic crisis or storm: Secondary | ICD-10-CM

## 2014-05-20 ENCOUNTER — Encounter (HOSPITAL_COMMUNITY): Payer: Self-pay

## 2014-05-20 ENCOUNTER — Encounter (HOSPITAL_COMMUNITY): Payer: Medicare Other

## 2014-05-20 ENCOUNTER — Encounter (HOSPITAL_COMMUNITY)
Admission: RE | Admit: 2014-05-20 | Discharge: 2014-05-20 | Disposition: A | Discharge status: Home / Self Care | Payer: Medicare Other | Source: Ambulatory Visit | Attending: Internal Medicine | Admitting: Internal Medicine

## 2014-05-20 DIAGNOSIS — R946 Abnormal results of thyroid function studies: Secondary | ICD-10-CM | POA: Diagnosis not present

## 2014-05-20 DIAGNOSIS — E0590 Thyrotoxicosis, unspecified without thyrotoxic crisis or storm: Secondary | ICD-10-CM | POA: Diagnosis not present

## 2014-05-20 MED ORDER — SODIUM IODIDE I 131 CAPSULE
10.0000 | Freq: Once | INTRAVENOUS | Status: AC | PRN
  Administered 2014-05-19: 10 via ORAL

## 2014-05-20 MED ORDER — SODIUM PERTECHNETATE TC 99M INJECTION
10.0000 | Freq: Once | INTRAVENOUS | Status: AC | PRN
  Administered 2014-05-20: 10 via INTRAVENOUS

## 2014-05-23 ENCOUNTER — Other Ambulatory Visit: Payer: Self-pay | Admitting: *Deleted

## 2014-05-23 DIAGNOSIS — R7989 Other specified abnormal findings of blood chemistry: Secondary | ICD-10-CM

## 2014-05-23 DIAGNOSIS — E042 Nontoxic multinodular goiter: Secondary | ICD-10-CM

## 2014-05-26 IMAGING — CR DG LUMBAR SPINE COMPLETE 4+V
6 series · 6 of 6 positions shown · non-contrast
Comparison: None.

CLINICAL DATA: Lower leg pain.

EXAM:
LUMBAR SPINE - COMPLETE 4+ VIEW

[view not recorded (1 of 6)]
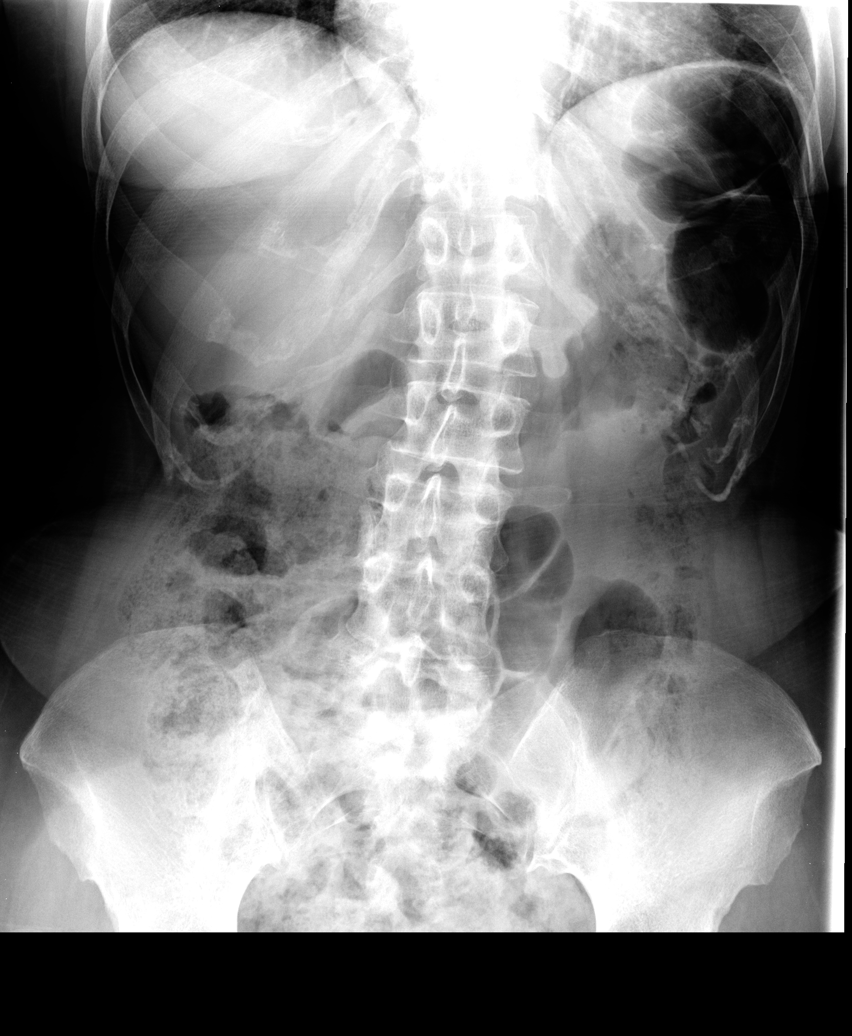

[view not recorded (2 of 6)]
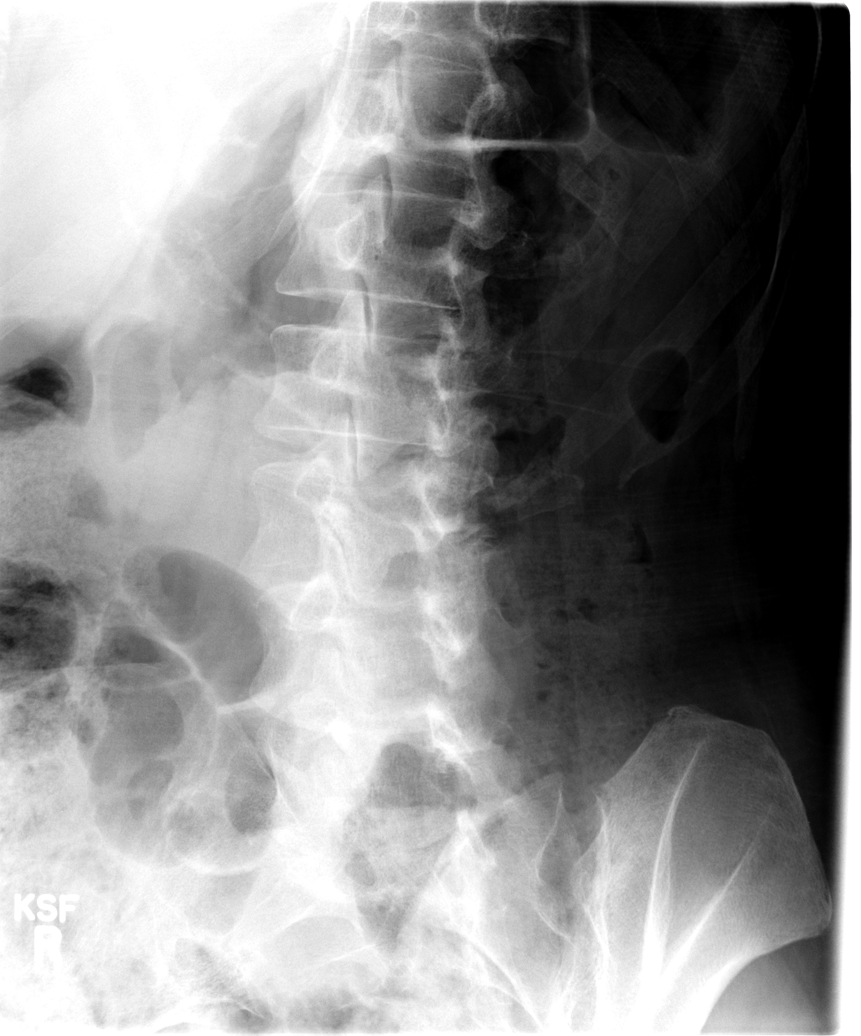

[view not recorded (3 of 6)]
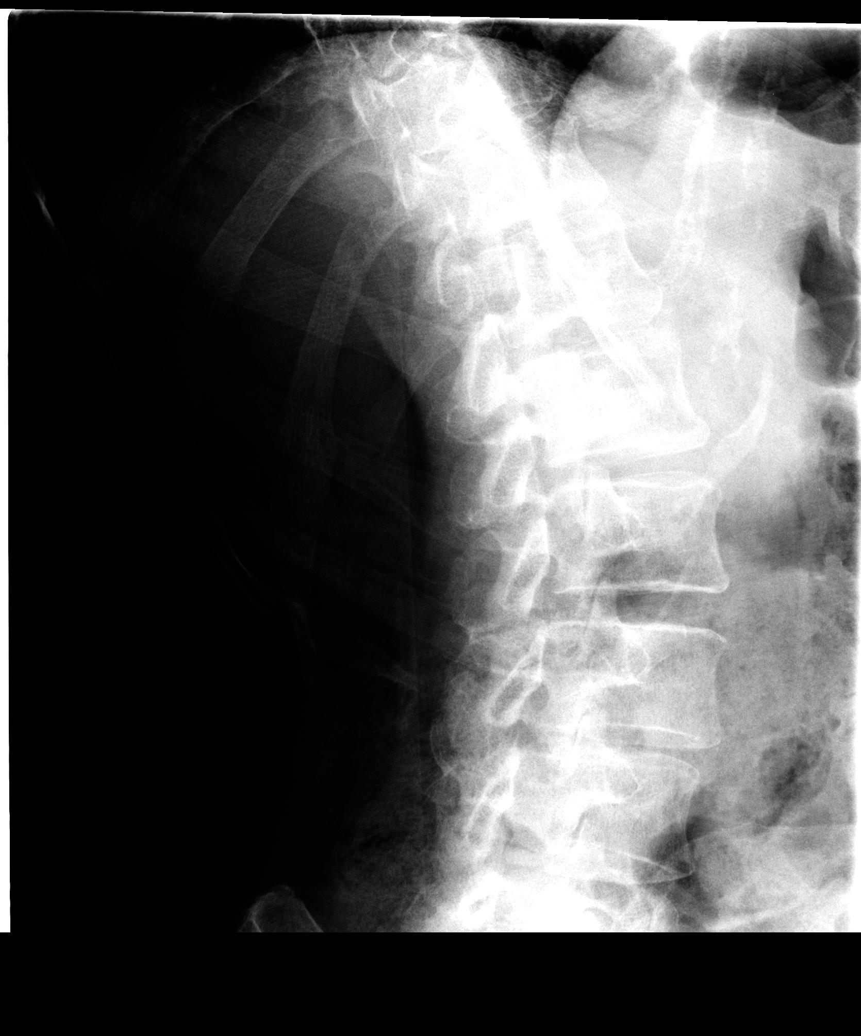

[view not recorded (4 of 6)]
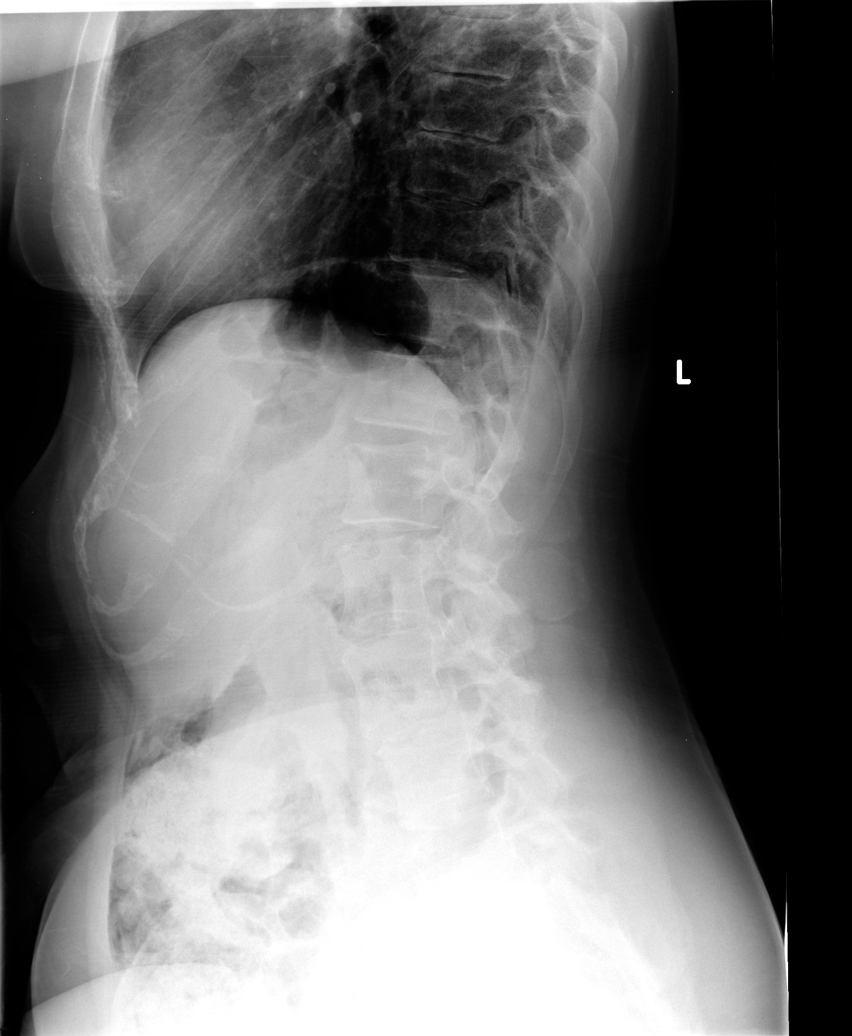

[view not recorded (5 of 6)]
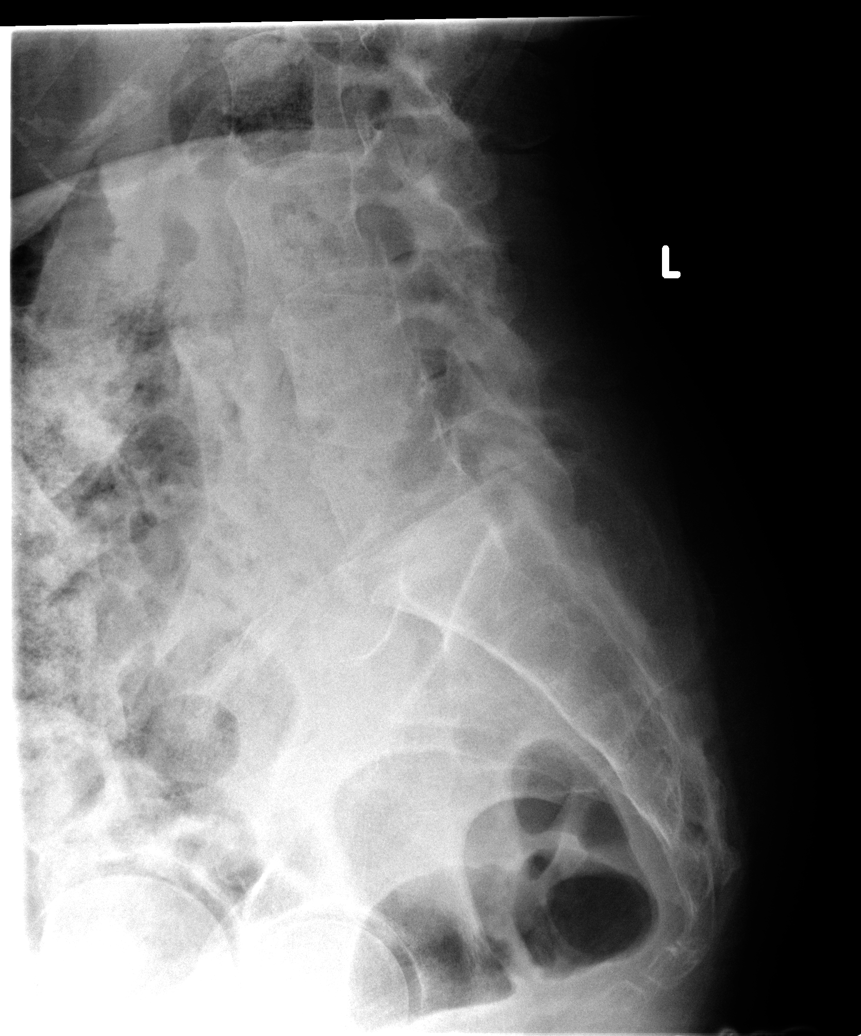

[view not recorded (6 of 6)]
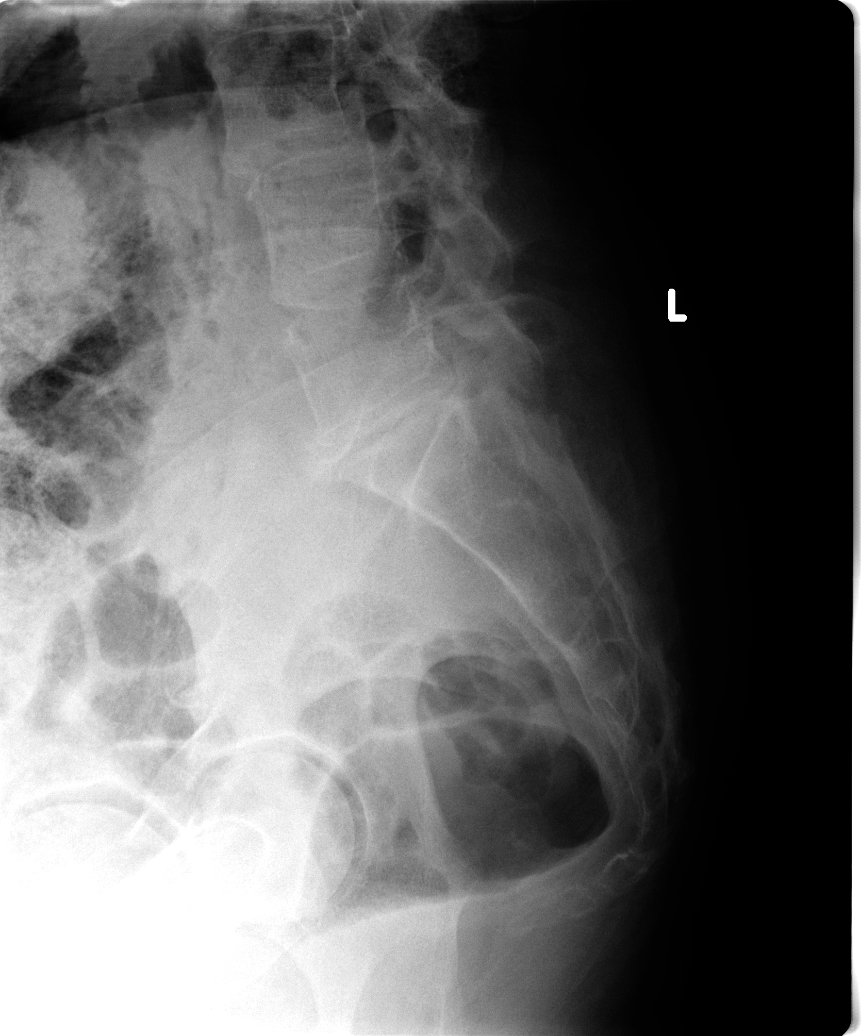

[6 of 6 positions shown; findings below may reference images not displayed]

FINDINGS: Degenerative disc disease and facet disease throughout the lumbar
spine. Slight anterolisthesis of L3 on L4 likely related to facet
disease. No fracture. SI joints are symmetric and unremarkable. Mild
levoscoliosis in the thoracolumbar spine.
IMPRESSION: Degenerative disc and facet disease as above. No acute findings.

## 2014-05-26 IMAGING — CR DG HIP (WITH OR WITHOUT PELVIS) 2-3V*L*
2 series · 2 of 2 positions shown · non-contrast
Comparison: None.

CLINICAL DATA: No trauma, leg pain

EXAM:
LEFT HIP - COMPLETE 2+ VIEW

[view not recorded (1 of 2)]
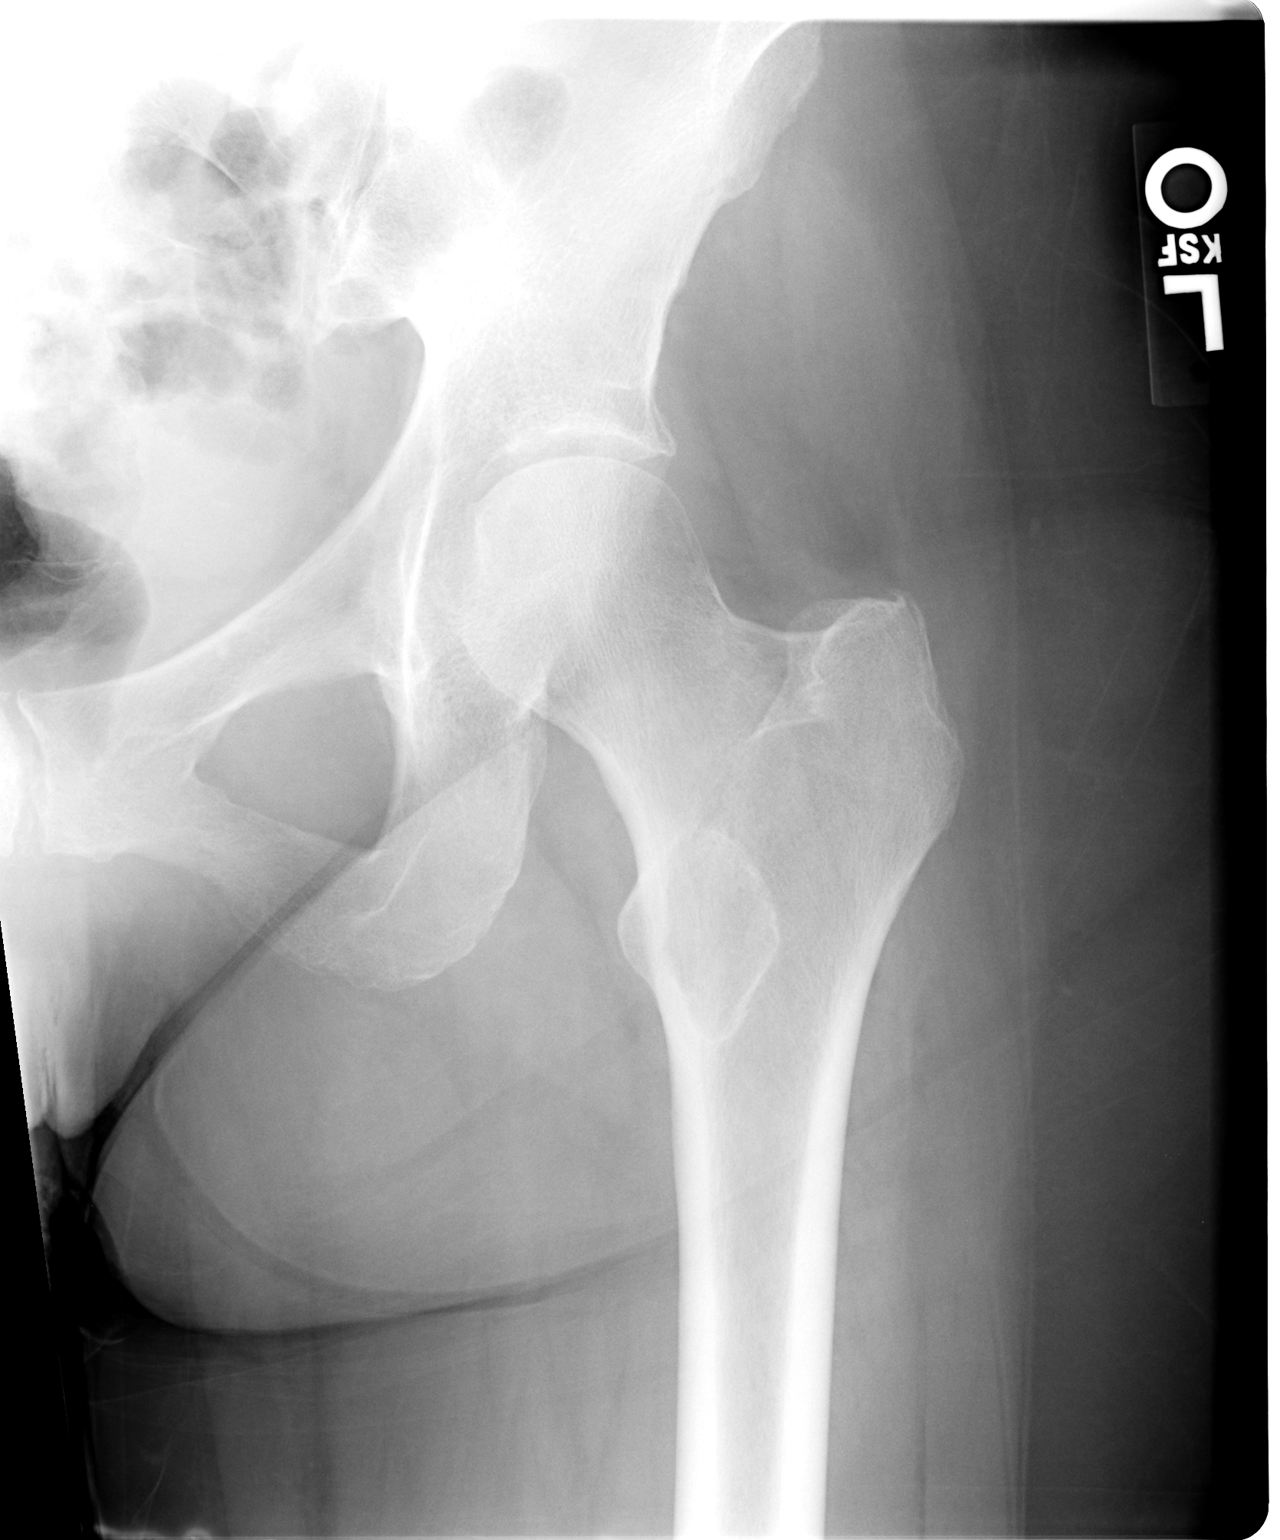

[view not recorded (2 of 2)]
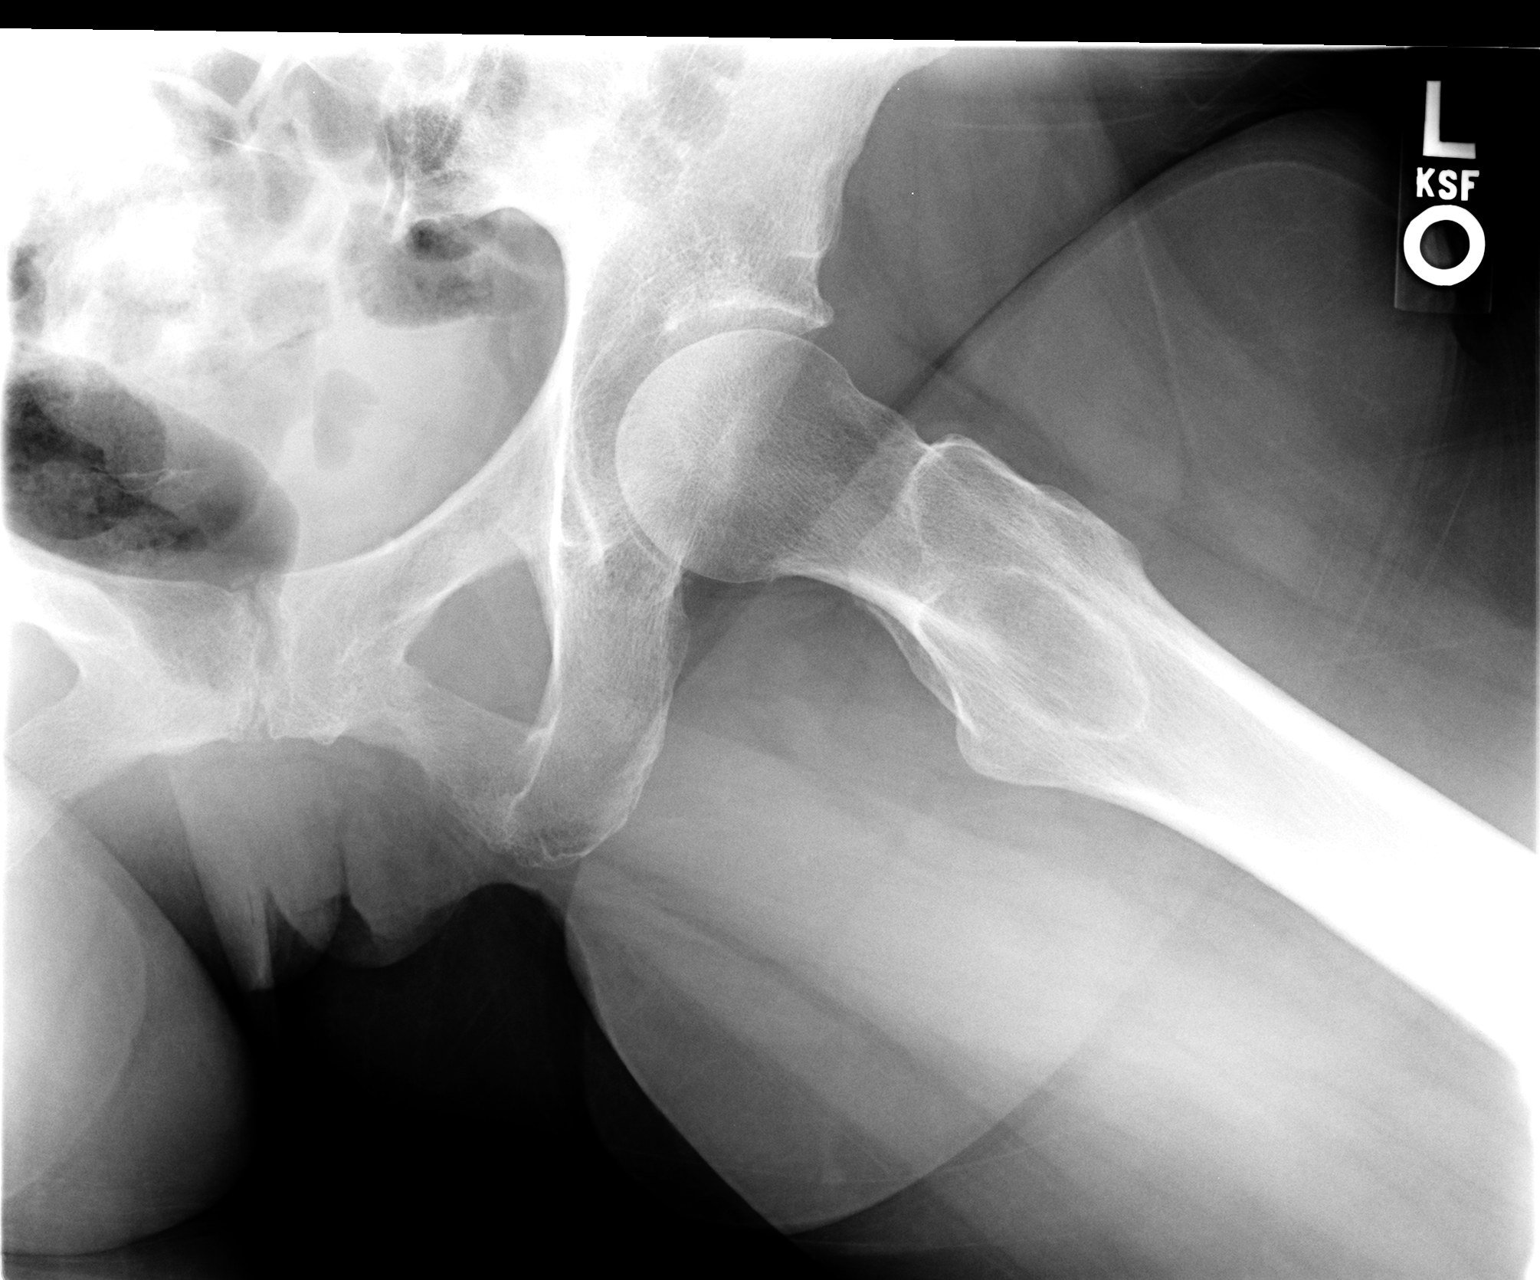

[2 of 2 positions shown; findings below may reference images not displayed]

FINDINGS: Minimal joint space narrowing. No osteophytosis. No fracture. No
dislocation. .
IMPRESSION: No acute osseous abnormality.

## 2014-05-27 ENCOUNTER — Encounter: Payer: Self-pay | Admitting: Internal Medicine

## 2014-05-27 ENCOUNTER — Ambulatory Visit (INDEPENDENT_AMBULATORY_CARE_PROVIDER_SITE_OTHER): Payer: Medicare Other | Admitting: Internal Medicine

## 2014-05-27 VITALS — BP 112/58 | HR 84 | Temp 98.3°F | Resp 12 | Ht 66.0 in | Wt 156.0 lb

## 2014-05-27 DIAGNOSIS — E0520 Thyrotoxicosis with toxic multinodular goiter without thyrotoxic crisis or storm: Secondary | ICD-10-CM

## 2014-05-27 HISTORY — DX: Thyrotoxicosis with toxic multinodular goiter without thyrotoxic crisis or storm: E05.20

## 2014-05-27 LAB — TSH: TSH: 0.03 u[IU]/mL — AB (ref 0.35–4.50)

## 2014-05-27 LAB — T3, FREE: T3 FREE: 3.2 pg/mL (ref 2.3–4.2)

## 2014-05-27 LAB — T4, FREE: Free T4: 1.23 ng/dL (ref 0.60–1.60)

## 2014-05-27 MED ORDER — METHIMAZOLE 10 MG PO TABS: 10.0000 mg | ORAL_TABLET | Freq: Two times a day (BID) | ORAL | Status: DC

## 2014-05-27 NOTE — Patient Instructions (Signed)
Please stop at the lab. Please try to join MyChart for easier communication. I will send you the results through there.  Please start methimazole 10 mg 2x a day with meals. Stop the Methimazole 4 days prior to the RAI treatment.  Do not restart the Methimazole after the RAI treatment. We will schedule your RAI treatment and you will be called with the schedule. I would like to see you back ~ 1 month after the RAI treatment.   Please stop the Methimazole (Tapazole) and call us or your primary care doctor if you develop: - sore throat - fever - yellow skin - dark urine - light colored stools As we will then need to check your blood counts and liver tests.  Hyperthyroidism The thyroid is a large gland located in the lower front part of your neck. The thyroid helps control metabolism. Metabolism is how your body uses food. It controls metabolism with the hormone thyroxine. When the thyroid is overactive, it produces too much hormone. When this happens, these following problems may occur:   Nervousness  Heat intolerance  Weight loss (in spite of increase food intake)  Diarrhea  Change in hair or skin texture  Palpitations (heart skipping or having extra beats)  Tachycardia (rapid heart rate)  Loss of menstruation (amenorrhea)  Shaking of the hands CAUSES  Grave's Disease (the immune system attacks the thyroid gland). This is the most common cause.  Inflammation of the thyroid gland.  Multi-nodular goiter (several overactive thyroid nodules)  Excessive use of thyroid medications (both prescription and 'natural').  Excessive ingestion of Iodine. DIAGNOSIS  To prove hyperthyroidism, your caregiver may do blood tests and ultrasound tests. Sometimes the signs are hidden. It may be necessary for your caregiver to watch this illness with blood tests, either before or after diagnosis and treatment. TREATMENT Short-term treatment There are several treatments to control symptoms.  Drugs called beta blockers may give some relief. Drugs that decrease hormone production will provide temporary relief in many people. These measures will usually not give permanent relief. Definitive therapy There are treatments available which can be discussed between you and your caregiver which will permanently treat the problem. These treatments range from surgery (removal of the thyroid), to the use of radioactive iodine (destroys the thyroid by radiation), to the use of antithyroid drugs (interfere with hormone synthesis). The first two treatments are permanent and usually successful. They most often require hormone replacement therapy for life. This is because it is impossible to remove or destroy the exact amount of thyroid required to make a person euthyroid (normal). HOME CARE INSTRUCTIONS  See your caregiver if the problems you are being treated for get worse. Examples of this would be the problems listed above. SEEK MEDICAL CARE IF: Your general condition worsens. MAKE SURE YOU:   Understand these instructions.  Will watch your condition.  Will get help right away if you are not doing well or get worse. Document Released: 09/16/2005 Document Revised: 12/09/2011 Document Reviewed: 01/28/2007 [facility] Patient Information 2015 [place], [place]. This information is not intended to replace advice given to you by your health care provider. Make sure you discuss any questions you have with your health care provider.

## 2014-05-27 NOTE — Progress Notes (Signed)
Patient ID: KAVITHA LANSDALE, female   DOB: January 21, 1946, 68 y.o.   MRN: [id number]   HPI  Laura Maynard is a 68 y.o.-year-old female, referred by her PCP, Dr. 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  Pt had screening labs in 01/2014 and was found to have a low TSH. This was repeated 2x and the TSH continued to decrease.   I reviewed pt's thyroid tests: Lab Results  Component Value Date   TSH 0.009* 05/02/2014   TSH 0.042* 03/30/2014   TSH 0.079* 02/08/2014   TSH 0.729 10/27/2013    She was sent for a thyroid Uptake and scan (05/20/2014) >> uptake 48.5% and scan c/w TMNG.  She is anxious about the dx and does not understand the thyroid results.  Pt denies feeling nodules in neck, hoarseness, dysphagia/odynophagia, SOB with lying down; she c/o: - + insomnia - weight loss ~10 lbs in last 4 mo, then gained 3 lbs - no excessive sweating/heat intolerance, + cold intolerance - no tremors - no anxiety - + fatigue - no hyperdefecation, + constipation  Pt does not have a FH of thyroid ds. No FH of thyroid cancer. No h/o radiation tx to head or neck.  No seaweed or kelp, no recent contrast studies. No steroid use. No herbal supplements.   I reviewed her chart and she also has a history of HTN, HL, preDM, vit D def., anemia.  ROS: Constitutional: see HPI Eyes: + blurry vision, no xerophthalmia ENT: no sore throat, no nodules palpated in throat, no dysphagia/odynophagia, no hoarseness Cardiovascular: no CP/SOB/palpitations/leg swelling Respiratory: occasional cough/no SOB Gastrointestinal: no N/V/D/C Musculoskeletal: + both muscle/joint aches Skin: no rashes, + hair loss, + easy bruising Neurological: no tremors/numbness/tingling/dizziness, + HA Psychiatric: no depression/+ anxiety  Past Medical History  Diagnosis Date  . Cataract   . Hyperlipidemia   . Migraine   . Blind right eye     legally  . Sarcoidosis, lung 15 YRS AGO    NO TREATMENTS   . Neuropathy   . Hypertension   . Prediabetes   . Vitamin D deficiency   . Anemia    Past Surgical History  Procedure Laterality Date  . Eye surgery  1964    right eye  . Back surgery  1970  . Nasal septum surgery  1998  . Total abdominal hysterectomy  2000  . Knee arthroscopy  2002  . Colonoscopy  2006; 03/28/11    2 small adenomas; normal   History   Social History  . Marital Status: Divorced    Spouse Name: N/A    Number of Children: 0  . Years of Education: college   Occupational History  .      Retired   Social History Main Topics  . Smoking status: Never Smoker   . Smokeless tobacco: Never Used  . Alcohol Use: No     Comment: RARE  . Drug Use: No   Social History Narrative   Patient is retired and lives at home alone. Patient  has college education.   Caffeine- two cups of caffeine daily.   Current Outpatient Prescriptions on File Prior to Visit  Medication Sig Dispense Refill  . aspirin 81 MG tablet Take 81 mg by mouth daily.        . butalbital-acetaminophen-caffeine (FIORICET, ESGIC) 50-325-40 MG per tablet TAKE 1 TO 2 TABLETS BY MOUTH EVERY 4 HOURS. MAX OF 6 TABLETS PER DAY  50 tablet  0  . Chlorphen-Phenyleph-APAP  2-5-325 MG TABS Take 1 tablet by mouth as needed.      . cholecalciferol (VITAMIN D) 1000 UNITS tablet Take 1,000 Units by mouth daily.      . meclizine (ANTIVERT) 25 MG tablet Take 25 mg by mouth daily. PRN      . Naproxen Sodium (ALEVE PO) Take 1 tablet by mouth as needed.        . SUMAtriptan Succinate 6 MG/0.5ML SOTJ Inject subcutaneous into skin as needed for migraines  6 Device  0   No current facility-administered medications on file prior to visit.   Allergies  Allergen Reactions  . Penicillins Hives  . Pravastatin     Myalgia  . Sulfa Antibiotics Hives  . Zetia [Ezetimibe]     Myalgia   Family History  Problem Relation Age of Onset  . Lung cancer Sister 108  . Throat cancer Brother 55  . Colon cancer Neg Hx   . COPD  Sister   . Diabetes Brother   . Stroke Mother   . Emphysema Father    PE: BP 112/58  Pulse 84  Temp(Src) 98.3 F (36.8 C) (Oral)  Resp 12  Ht 5\' 6"  (1.676 m)  Wt 156 lb (70.761 kg)  BMI 25.19 kg/m2  SpO2 99% Wt Readings from Last 3 Encounters:  05/27/14 156 lb (70.761 kg)  04/04/14 155 lb 6.4 oz (70.489 kg)  03/30/14 155 lb 6.4 oz (70.489 kg)   Constitutional: normal weight, in NAD, appears anxious Eyes: surgical pupil R eye, EOMI, no exophthalmos, no lid lag, no stare ENT: moist mucous membranes, slight symmetric thyromegaly, no thyroid bruits, no cervical lymphadenopathy Cardiovascular: RRR, No MRG Respiratory: CTA B Gastrointestinal: abdomen soft, NT, ND, BS+ Musculoskeletal: no deformities, strength intact in all 4 Skin: moist, warm, no rashes Neurological: no tremor with outstretched hands, DTR normal in all 4  ASSESSMENT: 1. Toxic MNG  PLAN:  1. Patient with a few months- h/o thyrotoxicosis, with some thyrotoxic sxs: weight loss, anxiety, insomnia - an uptake and scan was c/o toxic multinodular goiter - I suggested that we check the TSH, fT3 and fT4 again since last TSH check was ~ 1 mo ago and we do not have free T4 and free T3 levels - If the tests remain abnormal (which I suspect), she will need treatment for the TMNG (definitive tx is RAI Tx) >> explained the possible SE of hypothyroidism after tx - I will start her on MMI 10 mg bid to decrease the thyroid hh levels before her RAI tx. She will need to stop this 4 days prior to the RAI tx. She was advised of possible SEs. - I do not feel that we need to add beta blockers at this time, since she is not tachycardic, anxious, or tremulous - I advised her to join my chart to communicate easier - RTC in 2 months, but likely sooner for repeat labs  Patient Instructions  Please stop at the lab. Please try to join MyChart for easier communication. I will send you the results through there.  Please start methimazole 10  mg 2x a day with meals. Stop the Methimazole 4 days prior to the RAI treatment.  Do not restart the Methimazole after the RAI treatment. We will schedule your RAI treatment and you will be called with the schedule. I would like to see you back ~ 1 month after the RAI treatment.   Please stop the Methimazole (Tapazole) and call us or your primary care doctor if  you develop: - sore throat - fever - yellow skin - dark urine - light colored stools As we will then need to check your blood counts and liver tests.  Office Visit on 05/27/2014  Component Date Value Ref Range Status  . TSH 05/27/2014 0.03* 0.35 - 4.50 uIU/mL Final  . Free T4 05/27/2014 1.23  0.60 - 1.60 ng/dL Final  . T3, Free 05/27/2014 3.2  2.3 - 4.2 pg/mL Final   Since labs better >> decrease MMI to 5 mg bid.

## 2014-05-29 ENCOUNTER — Encounter: Payer: Self-pay | Admitting: Internal Medicine

## 2014-05-29 DIAGNOSIS — Z9114 Patient's other noncompliance with medication regimen: Secondary | ICD-10-CM | POA: Insufficient documentation

## 2014-05-29 NOTE — Patient Instructions (Signed)

## 2014-05-29 NOTE — Progress Notes (Signed)
Patient ID: Laura Maynard, female   DOB: 1946/07/22, 68 y.o.   MRN: [id number]   This very nice 68 y.o.SBF presents for 3 month follow up with Hypertension, Hyperlipidemia, Pre-Diabetes and Vitamin D Deficiency.    Patient is monitored expectantly for HTN & BP has been controlled at home. Today's BP is 122/76 mmHg. Patient denies any cardiac type chest pain, palpitations, dyspnea/orthopnea/PND, dizziness, claudication, or dependent edema. More recently, patient has been mildly hyperthyroid as confirmed by Nuclear Med Scan finding an overactive multinodular goiter. Patient has been relatively asymptomatic. Patient was referred to Dr Benjiman Core & seen yesterday for assistance in convincing patient of necessity of treatment as patient has been poorly compliant with recommendations in the past. Patient has been scheduled for RAI Tx. By Dr Cruzita Lederer. Today patient alleges "Nobody has tod me anything about anything"   Hyperlipidemia is controlled with diet & meds. Patient denies myalgias or other med SE's. Last Lipids were  Chol 254; HDL 101; LDL 144; Trig 46 on 02/08/2014.   Also, the patient has history of PreDiabetes and patient denies any symptoms of reactive hypoglycemia, diabetic polys, paresthesias or visual blurring.  Last A1c was  5.6% on 02/08/2014.    Further, Patient has history of Vitamin D Deficiency and patient supplements vitamin D without any suspected side-effects. Last vitamin D was  36 on 02/08/2014.   Medication List   ALEVE PO  Take 1 tablet by mouth as needed.     aspirin 81 MG tablet  Take 81 mg by mouth daily.     butalbital-acetaminophen-caffeine 50-325-40 MG per tablet  Commonly known as:  FIORICET, ESGIC  TAKE 1 TO 2 TABLETS BY MOUTH EVERY 4 HOURS. MAX OF 6 TABLETS PER DAY     Chlorphen-Phenyleph-APAP 2-5-325 MG Tabs  Take 1 tablet by mouth as needed.     cholecalciferol 1000 UNITS tablet  Commonly known as:  VITAMIN D  Take 1,000 Units by mouth daily.      meclizine 25 MG tablet  Commonly known as:  ANTIVERT  Take 25 mg by mouth daily. PRN     methimazole 10 MG tablet  Commonly known as:  TAPAZOLE  Take 1 tablet (10 mg total) by mouth 2 (two) times daily.     SUMAtriptan Succinate 6 MG/0.5ML Sotj  Inject subcutaneous into skin as needed for migraines       Allergies  Allergen Reactions  . Penicillins Hives  . Pravastatin     Myalgia  . Sulfa Antibiotics Hives  . Zetia [Ezetimibe]     Myalgia   PMHx:   Past Medical History  Diagnosis Date  . Cataract   . Hyperlipidemia   . Migraine   . Blind right eye     legally  . Sarcoidosis, lung 15 YRS AGO    NO TREATMENTS  . Neuropathy   . Hypertension   . Prediabetes   . Vitamin D deficiency   . Anemia   . Toxic multinodular goiter 05/27/2014   FHx:    Reviewed / unchanged SHx:    Reviewed / unchanged  Systems Review:  Constitutional: Denies fever, chills, wt changes, headaches, insomnia, fatigue, night sweats, change in appetite. Eyes: Denies redness, blurred vision, diplopia, discharge, itchy, watery eyes.  ENT: Denies discharge, congestion, post nasal drip, epistaxis, sore throat, earache, hearing loss, dental pain, tinnitus, vertigo, sinus pain, snoring.  CV: Denies chest pain, palpitations, irregular heartbeat, syncope, dyspnea, diaphoresis, orthopnea, PND, claudication or edema. Respiratory: denies cough, dyspnea, DOE, pleurisy,  hoarseness, laryngitis, wheezing.  Gastrointestinal: Denies dysphagia, odynophagia, heartburn, reflux, water brash, abdominal pain or cramps, nausea, vomiting, bloating, diarrhea, constipation, hematemesis, melena, hematochezia  or hemorrhoids. Genitourinary: Denies dysuria, frequency, urgency, nocturia, hesitancy, discharge, hematuria or flank pain. Musculoskeletal: Denies arthralgias, myalgias, stiffness, jt. swelling, pain, limping or strain/sprain.  Skin: Denies pruritus, rash, hives, warts, acne, eczema or change in skin lesion(s). Neuro: No  weakness, tremor, incoordination, spasms, paresthesia or pain. Psychiatric: Denies confusion, memory loss or sensory loss. Endo: Denies change in weight, skin or hair change.  Heme/Lymph: No excessive bleeding, bruising or enlarged lymph nodes.  Exam:  BP 122/76  Pulse 86  Temp 97.7 F   Resp 16  Ht 5' 6.75"   Wt 155 lb 9.6 oz BMI 24.57  Appears well nourished and in no distress. Eyes: PERRLA, EOMs, conjunctiva no swelling or erythema. Sinuses: No frontal/maxillary tenderness ENT/Mouth: EAC's clear, TM's nl w/o erythema, bulging. Nares clear w/o erythema, swelling, exudates. Oropharynx clear without erythema or exudates. Oral hygiene is good. Tongue normal, non obstructing. Hearing intact.  Neck: Supple. Thyroid nl. Car 2+/2+ without bruits, nodes or JVD. Chest: Respirations nl with BS clear & equal w/o rales, rhonchi, wheezing or stridor.  Cor: Heart sounds normal w/ regular rate and rhythm without sig. murmurs, gallops, clicks, or rubs. Peripheral pulses normal and equal  without edema.  Abdomen: Soft & bowel sounds normal. Non-tender w/o guarding, rebound, hernias, masses, or organomegaly.  Lymphatics: Unremarkable.  Musculoskeletal: Full ROM all peripheral extremities, joint stability, 5/5 strength, and normal gait.  Skin: Warm, dry without exposed rashes, lesions or ecchymosis apparent.  Neuro: Cranial nerves intact, reflexes equal bilaterally. Sensory-motor testing grossly intact. Tendon reflexes grossly intact.  Pysch: Alert & oriented x 3.  Insight and judgement nl & appropriate. No ideations.  Assessment and Plan:  1. Hypertension - Continue monitor blood pressure at home. Continue diet same.  2. Hyperlipidemia - Continue diet, exercise,& lifestyle modifications. Continue monitor periodic cholesterol/liver & renal functions   3. Pre-Diabetes - Continue diet, exercise, lifestyle modifications. Monitor appropriate labs.   4. Vitamin D Deficiency - Continue  supplementation.  5. Hyperthyroidism/ Multinodular goiter   Recommended regular exercise, BP monitoring, weight control, and discussed med and SE's. Recommended quarterly labs to assess and monitor clinical status. Further disposition pending results of labs.

## 2014-05-30 ENCOUNTER — Other Ambulatory Visit: Payer: Self-pay | Admitting: Internal Medicine

## 2014-05-30 ENCOUNTER — Ambulatory Visit (INDEPENDENT_AMBULATORY_CARE_PROVIDER_SITE_OTHER): Payer: Medicare Other | Admitting: Internal Medicine

## 2014-05-30 ENCOUNTER — Encounter: Payer: Self-pay | Admitting: Internal Medicine

## 2014-05-30 VITALS — BP 122/76 | HR 86 | Temp 97.7°F | Resp 16 | Ht 66.75 in | Wt 155.6 lb

## 2014-05-30 DIAGNOSIS — R7303 Prediabetes: Secondary | ICD-10-CM

## 2014-05-30 DIAGNOSIS — I10 Essential (primary) hypertension: Secondary | ICD-10-CM

## 2014-05-30 DIAGNOSIS — Z79899 Other long term (current) drug therapy: Secondary | ICD-10-CM

## 2014-05-30 DIAGNOSIS — E559 Vitamin D deficiency, unspecified: Secondary | ICD-10-CM

## 2014-05-30 DIAGNOSIS — E782 Mixed hyperlipidemia: Secondary | ICD-10-CM

## 2014-05-30 DIAGNOSIS — E0520 Thyrotoxicosis with toxic multinodular goiter without thyrotoxic crisis or storm: Secondary | ICD-10-CM

## 2014-05-30 DIAGNOSIS — R7309 Other abnormal glucose: Secondary | ICD-10-CM

## 2014-05-30 LAB — CBC WITH DIFFERENTIAL/PLATELET
BASOS ABS: 0.1 10*3/uL (ref 0.0–0.1)
BASOS PCT: 1 % (ref 0–1)
Eosinophils Absolute: 0 10*3/uL (ref 0.0–0.7)
Eosinophils Relative: 0 % (ref 0–5)
HEMATOCRIT: 35.8 % — AB (ref 36.0–46.0)
Hemoglobin: 11.8 g/dL — ABNORMAL LOW (ref 12.0–15.0)
Lymphocytes Relative: 27 % (ref 12–46)
Lymphs Abs: 1.5 10*3/uL (ref 0.7–4.0)
MCH: 26.8 pg (ref 26.0–34.0)
MCHC: 33 g/dL (ref 30.0–36.0)
MCV: 81.2 fL (ref 78.0–100.0)
Monocytes Absolute: 0.5 10*3/uL (ref 0.1–1.0)
Monocytes Relative: 9 % (ref 3–12)
NEUTROS ABS: 3.4 10*3/uL (ref 1.7–7.7)
NEUTROS PCT: 63 % (ref 43–77)
Platelets: 364 10*3/uL (ref 150–400)
RBC: 4.41 MIL/uL (ref 3.87–5.11)
RDW: 14.2 % (ref 11.5–15.5)
WBC: 5.4 10*3/uL (ref 4.0–10.5)

## 2014-05-30 LAB — BASIC METABOLIC PANEL WITH GFR
BUN: 9 mg/dL (ref 6–23)
CHLORIDE: 106 meq/L (ref 96–112)
CO2: 24 mEq/L (ref 19–32)
Calcium: 9.8 mg/dL (ref 8.4–10.5)
Creat: 0.72 mg/dL (ref 0.50–1.10)
GFR, EST NON AFRICAN AMERICAN: 87 mL/min
Glucose, Bld: 106 mg/dL — ABNORMAL HIGH (ref 70–99)
Potassium: 4.4 mEq/L (ref 3.5–5.3)
SODIUM: 141 meq/L (ref 135–145)

## 2014-05-30 LAB — LIPID PANEL
CHOL/HDL RATIO: 2.3 ratio
CHOLESTEROL: 228 mg/dL — AB (ref 0–200)
HDL: 98 mg/dL (ref >39)
LDL CALC: 117 mg/dL — AB (ref 0–99)
TRIGLYCERIDES: 67 mg/dL (ref <150)
VLDL: 13 mg/dL (ref 0–40)

## 2014-05-30 LAB — HEPATIC FUNCTION PANEL
ALK PHOS: 98 U/L (ref 39–117)
ALT: 14 U/L (ref 0–35)
AST: 17 U/L (ref 0–37)
Albumin: 4.4 g/dL (ref 3.5–5.2)
BILIRUBIN DIRECT: 0.1 mg/dL (ref 0.0–0.3)
BILIRUBIN INDIRECT: 0.2 mg/dL (ref 0.2–1.2)
BILIRUBIN TOTAL: 0.3 mg/dL (ref 0.2–1.2)
TOTAL PROTEIN: 6.6 g/dL (ref 6.0–8.3)

## 2014-05-30 LAB — HEMOGLOBIN A1C
Hgb A1c MFr Bld: 5.8 % — ABNORMAL HIGH (ref <5.7)
Mean Plasma Glucose: 120 mg/dL — ABNORMAL HIGH (ref <117)

## 2014-05-30 LAB — MAGNESIUM: Magnesium: 2.2 mg/dL (ref 1.5–2.5)

## 2014-05-30 MED ORDER — ALPRAZOLAM 1 MG PO TABS: 1.0000 mg | ORAL_TABLET | Freq: Every evening | ORAL | Status: DC | PRN

## 2014-05-30 MED ORDER — METHIMAZOLE 10 MG PO TABS
5.0000 mg | ORAL_TABLET | Freq: Two times a day (BID) | ORAL | Status: DC
Start: 2014-05-30 — End: 2014-09-14

## 2014-05-31 ENCOUNTER — Telehealth: Payer: Self-pay | Admitting: Internal Medicine

## 2014-05-31 ENCOUNTER — Encounter: Payer: Self-pay | Admitting: *Deleted

## 2014-05-31 LAB — VITAMIN D 25 HYDROXY (VIT D DEFICIENCY, FRACTURES): Vit D, 25-Hydroxy: 33 ng/mL (ref 30–89)

## 2014-05-31 LAB — INSULIN, FASTING: INSULIN FASTING, SERUM: 7.3 u[IU]/mL (ref 2.0–19.6)

## 2014-05-31 NOTE — Telephone Encounter (Signed)
Called pt and lvm advising her per Dr Gherghe's result note.  

## 2014-05-31 NOTE — Telephone Encounter (Signed)
Patient is having trouble with he computer and is not able to get on my chart, she would like to know the results of her lab work. Please advise

## 2014-06-02 ENCOUNTER — Encounter: Payer: Self-pay | Admitting: *Deleted

## 2014-06-02 ENCOUNTER — Telehealth: Payer: Self-pay | Admitting: *Deleted

## 2014-06-02 NOTE — Telephone Encounter (Signed)
Called and scheduled pt's RAI tx. Scheduled for Thursday, Sept 10th at 9:30 am (arrival time 9:15 am). Called pt and lvm advising her of the date and time. Also advised pt to stop the methimazole 4 days prior to the treatment and do not start the methimazole after. Letter sent to pt as well.

## 2014-06-08 ENCOUNTER — Other Ambulatory Visit: Payer: Self-pay | Admitting: Physician Assistant

## 2014-06-09 ENCOUNTER — Encounter (HOSPITAL_COMMUNITY)
Admission: RE | Admit: 2014-06-09 | Discharge: 2014-06-09 | Disposition: A | Discharge status: Home / Self Care | Payer: Medicare Other | Source: Ambulatory Visit | Attending: Diagnostic Radiology | Admitting: Diagnostic Radiology

## 2014-06-09 DIAGNOSIS — E0520 Thyrotoxicosis with toxic multinodular goiter without thyrotoxic crisis or storm: Secondary | ICD-10-CM | POA: Insufficient documentation

## 2014-06-09 DIAGNOSIS — E0590 Thyrotoxicosis, unspecified without thyrotoxic crisis or storm: Secondary | ICD-10-CM | POA: Insufficient documentation

## 2014-06-09 MED ORDER — SODIUM IODIDE I 131 CAPSULE
25.5000 | Freq: Once | INTRAVENOUS | Status: AC
  Administered 2014-06-09: 25.5 via ORAL

## 2014-07-05 DIAGNOSIS — Z23 Encounter for immunization: Secondary | ICD-10-CM | POA: Diagnosis not present

## 2014-07-08 ENCOUNTER — Ambulatory Visit (INDEPENDENT_AMBULATORY_CARE_PROVIDER_SITE_OTHER): Payer: Medicare Other | Admitting: Internal Medicine

## 2014-07-08 ENCOUNTER — Encounter: Payer: Self-pay | Admitting: Internal Medicine

## 2014-07-08 VITALS — BP 114/60 | HR 90 | Temp 98.1°F | Resp 12 | Wt 152.0 lb

## 2014-07-08 DIAGNOSIS — E0520 Thyrotoxicosis with toxic multinodular goiter without thyrotoxic crisis or storm: Secondary | ICD-10-CM | POA: Diagnosis not present

## 2014-07-08 DIAGNOSIS — G4700 Insomnia, unspecified: Secondary | ICD-10-CM

## 2014-07-08 LAB — T3, FREE: T3, Free: 3.8 pg/mL (ref 2.3–4.2)

## 2014-07-08 LAB — TSH: TSH: 0 u[IU]/mL — ABNORMAL LOW (ref 0.35–4.50)

## 2014-07-08 NOTE — Patient Instructions (Signed)
Please stop at the lab.  Please come back for a follow-up appointment in 4 months.  If we need to start Levothyroxine (thyroid hormone) in the future, please take the thyroid hormone every day, with water, >30 minutes before breakfast, separated by >4 hours from acid reflux medications, calcium, iron, multivitamins.

## 2014-07-08 NOTE — Progress Notes (Signed)
Patient ID: Laura Maynard, female   DOB: 1945/10/26, 68 y.o.   MRN: [id number]   HPI  Laura Maynard is a 68 y.o.-year-old female, returning for f/u for thyrotoxycosis due to toxic multinodular goiter (TMNG). Last visit 1.5 mo ago.  Reviewed hx:  Pt had screening labs in 01/2014 and was found to have a low TSH. This was repeated 2x and the TSH continued to decrease.   I reviewed pt's thyroid tests: Lab Results  Component Value Date   TSH 0.03* 05/27/2014   TSH 0.009* 05/02/2014   TSH 0.042* 03/30/2014   TSH 0.079* 02/08/2014   TSH 0.729 10/27/2013   FREET4 1.23 05/27/2014    She was sent for a thyroid Uptake and scan (05/20/2014) >> uptake 48.5% and scan c/w TMNG.  We started MMI at last visit 5 mg bid.   Had RAI tx on 06/09/2014. After RAI, she stopped the MMI.  Pt denies feeling nodules in neck, hoarseness, dysphagia/odynophagia, SOB with lying down; she c/o: - + insomnia - weight loss ~3 lbs since last visit - + heat intolerance - no tremors - no anxiety - + fatigue - no hyperdefecation, + constipation   She also has a history of HTN, HL, preDM, vit D def., anemia.  ROS: Constitutional: see HPI, + poor sleep Eyes: + blurry vision, no xerophthalmia ENT: no sore throat, no nodules palpated in throat, no dysphagia/odynophagia, no hoarseness Cardiovascular: no CP/SOB/palpitations/+ leg swelling Respiratory: occasional cough/no SOB Gastrointestinal: no N/V/D/+ C/+ heartburn Musculoskeletal: + muscle aches/no joint aches Skin: no rashes, + easy bruising Neurological: no tremors/numbness/tingling/dizziness  PE: BP 114/60  Pulse 90  Temp(Src) 98.1 F (36.7 C) (Oral)  Resp 12  Wt 152 lb (68.947 kg)  SpO2 99% Wt Readings from Last 3 Encounters:  07/08/14 152 lb (68.947 kg)  05/30/14 155 lb 9.6 oz (70.58 kg)  05/27/14 156 lb (70.761 kg)   Constitutional: normal weight, in NAD Eyes: surgical pupil R eye, EOMI, no exophthalmos, no lid lag, no stare ENT: moist mucous  membranes, slight symmetric thyromegaly, no thyroid bruits, no cervical lymphadenopathy Cardiovascular: RRR, No MRG, + mild periankle edema Respiratory: CTA B Gastrointestinal: abdomen soft, NT, ND, BS+ Musculoskeletal: no deformities, strength intact in all 4 Skin: moist, warm, no rashes Neurological: no tremor with outstretched hands, DTR normal in all 4  ASSESSMENT: 1. Toxic MNG - s/p RAI tx  2. Insomnia  PLAN:  1. Patient with a few months- h/o thyrotoxicosis, with some thyrotoxic sxs: weight loss, anxiety, insomnia - an uptake and scan was c/o toxic multinodular goiter >> had RAI tx 1 mo ago - will check the TSH, fT3 and fT4 again  - I explained that sometimes we can see an increase in thyroid hormones right after the RAI tx, which usually improves - discussed that she may develop hypothyroidism after the RAI tx >> we discussed how to take the LT4 correctly if we need to start - I advised her to join my chart to communicate easier - RTC in 4 months, but in 2 mo for repeat labs  2. Insomnia - suggested to try Sleepy time tea vs. Melatonin vs. Benadryl  Office Visit on 07/08/2014  Component Date Value Ref Range Status  . TSH 07/08/2014 0.00* 0.35 - 4.50 uIU/mL Final  . Free T4 07/08/2014 1.79* 0.60 - 1.60 ng/dL Final  . T3, Free 07/08/2014 3.8  2.3 - 4.2 pg/mL Final   TFTs a little worse after the RAI Tx >> will start back  a low dose of MMI (5 mg daily) and will need to return for labs in 4 weeks.

## 2014-07-11 LAB — T4, FREE: FREE T4: 1.79 ng/dL — AB (ref 0.60–1.60)

## 2014-07-13 ENCOUNTER — Telehealth: Payer: Self-pay | Admitting: Internal Medicine

## 2014-07-13 NOTE — Telephone Encounter (Signed)
Returned pt's call. Advised her of her lab results per Dr Arman Filter result note. Pt understood. Pt scheduled lab appt.

## 2014-07-13 NOTE — Telephone Encounter (Signed)
Please call pt with lab results asap

## 2014-07-19 ENCOUNTER — Other Ambulatory Visit: Payer: Self-pay | Admitting: Internal Medicine

## 2014-07-27 ENCOUNTER — Ambulatory Visit: Payer: Medicare Other | Admitting: Internal Medicine

## 2014-07-27 DIAGNOSIS — M1712 Unilateral primary osteoarthritis, left knee: Secondary | ICD-10-CM | POA: Diagnosis not present

## 2014-07-27 DIAGNOSIS — M1711 Unilateral primary osteoarthritis, right knee: Secondary | ICD-10-CM | POA: Diagnosis not present

## 2014-08-08 ENCOUNTER — Other Ambulatory Visit (INDEPENDENT_AMBULATORY_CARE_PROVIDER_SITE_OTHER): Payer: Medicare Other

## 2014-08-08 DIAGNOSIS — E0520 Thyrotoxicosis with toxic multinodular goiter without thyrotoxic crisis or storm: Secondary | ICD-10-CM | POA: Diagnosis not present

## 2014-08-08 LAB — T4, FREE: Free T4: 0.88 ng/dL (ref 0.60–1.60)

## 2014-08-08 LAB — TSH: TSH: 0.19 u[IU]/mL — ABNORMAL LOW (ref 0.35–4.50)

## 2014-08-08 LAB — T3, FREE: T3 FREE: 2.9 pg/mL (ref 2.3–4.2)

## 2014-08-09 ENCOUNTER — Encounter: Payer: Self-pay | Admitting: *Deleted

## 2014-08-09 ENCOUNTER — Other Ambulatory Visit: Payer: Self-pay | Admitting: *Deleted

## 2014-08-09 DIAGNOSIS — E0520 Thyrotoxicosis with toxic multinodular goiter without thyrotoxic crisis or storm: Secondary | ICD-10-CM

## 2014-08-10 ENCOUNTER — Telehealth: Payer: Self-pay | Admitting: *Deleted

## 2014-08-10 NOTE — Telephone Encounter (Signed)
Please return pt's call she would like to speak with you before scheduling an appt

## 2014-08-10 NOTE — Telephone Encounter (Signed)
OK, let's have her stay off [place] for now and repeat TFTs (TSH, fT4 and fT3) in 1 mo.

## 2014-08-10 NOTE — Telephone Encounter (Signed)
Called pt and advised her per Dr Arman Filter note. Pt understood. Scheduled appt for labs in 1 mo.

## 2014-08-10 NOTE — Telephone Encounter (Signed)
Pt called and lvm stating that she is having a hard time with the methimazole. Pt states she is having problems sleeping and severe joint pain. Pt wants to know if there is another medication she can take? Please advise.

## 2014-08-20 ENCOUNTER — Encounter: Payer: Self-pay | Admitting: *Deleted

## 2014-09-08 ENCOUNTER — Other Ambulatory Visit: Payer: Medicare Other

## 2014-09-08 ENCOUNTER — Other Ambulatory Visit (INDEPENDENT_AMBULATORY_CARE_PROVIDER_SITE_OTHER): Payer: Medicare Other

## 2014-09-08 DIAGNOSIS — E0520 Thyrotoxicosis with toxic multinodular goiter without thyrotoxic crisis or storm: Secondary | ICD-10-CM | POA: Diagnosis not present

## 2014-09-08 LAB — T3, FREE: T3, Free: 3 pg/mL (ref 2.3–4.2)

## 2014-09-08 LAB — TSH: TSH: 0.55 u[IU]/mL (ref 0.35–4.50)

## 2014-09-08 LAB — T4, FREE: Free T4: 0.71 ng/dL (ref 0.60–1.60)

## 2014-09-14 ENCOUNTER — Ambulatory Visit (INDEPENDENT_AMBULATORY_CARE_PROVIDER_SITE_OTHER): Payer: Medicare Other | Admitting: Physician Assistant

## 2014-09-14 ENCOUNTER — Encounter: Payer: Self-pay | Admitting: Physician Assistant

## 2014-09-14 VITALS — BP 120/68 | HR 72 | Temp 98.2°F | Resp 16 | Ht 66.75 in | Wt 153.0 lb

## 2014-09-14 DIAGNOSIS — Z789 Other specified health status: Secondary | ICD-10-CM

## 2014-09-14 DIAGNOSIS — E0520 Thyrotoxicosis with toxic multinodular goiter without thyrotoxic crisis or storm: Secondary | ICD-10-CM

## 2014-09-14 DIAGNOSIS — H8113 Benign paroxysmal vertigo, bilateral: Secondary | ICD-10-CM

## 2014-09-14 DIAGNOSIS — E559 Vitamin D deficiency, unspecified: Secondary | ICD-10-CM | POA: Diagnosis not present

## 2014-09-14 DIAGNOSIS — Z0001 Encounter for general adult medical examination with abnormal findings: Secondary | ICD-10-CM | POA: Diagnosis not present

## 2014-09-14 DIAGNOSIS — R6889 Other general symptoms and signs: Secondary | ICD-10-CM

## 2014-09-14 DIAGNOSIS — Z79899 Other long term (current) drug therapy: Secondary | ICD-10-CM

## 2014-09-14 DIAGNOSIS — Z23 Encounter for immunization: Secondary | ICD-10-CM

## 2014-09-14 DIAGNOSIS — G4700 Insomnia, unspecified: Secondary | ICD-10-CM

## 2014-09-14 DIAGNOSIS — Z9114 Patient's other noncompliance with medication regimen: Secondary | ICD-10-CM

## 2014-09-14 DIAGNOSIS — H8110 Benign paroxysmal vertigo, unspecified ear: Secondary | ICD-10-CM | POA: Insufficient documentation

## 2014-09-14 DIAGNOSIS — R7309 Other abnormal glucose: Secondary | ICD-10-CM | POA: Diagnosis not present

## 2014-09-14 DIAGNOSIS — E782 Mixed hyperlipidemia: Secondary | ICD-10-CM | POA: Diagnosis not present

## 2014-09-14 DIAGNOSIS — D649 Anemia, unspecified: Secondary | ICD-10-CM

## 2014-09-14 DIAGNOSIS — M545 Low back pain: Secondary | ICD-10-CM

## 2014-09-14 DIAGNOSIS — I10 Essential (primary) hypertension: Secondary | ICD-10-CM

## 2014-09-14 DIAGNOSIS — Z1331 Encounter for screening for depression: Secondary | ICD-10-CM

## 2014-09-14 DIAGNOSIS — R7303 Prediabetes: Secondary | ICD-10-CM

## 2014-09-14 DIAGNOSIS — M26609 Unspecified temporomandibular joint disorder, unspecified side: Secondary | ICD-10-CM | POA: Insufficient documentation

## 2014-09-14 LAB — CBC WITH DIFFERENTIAL/PLATELET
Basophils Absolute: 0.1 10*3/uL (ref 0.0–0.1)
Basophils Relative: 1 % (ref 0–1)
Eosinophils Absolute: 0.1 10*3/uL (ref 0.0–0.7)
Eosinophils Relative: 1 % (ref 0–5)
HEMATOCRIT: 35.3 % — AB (ref 36.0–46.0)
HEMOGLOBIN: 11.7 g/dL — AB (ref 12.0–15.0)
LYMPHS ABS: 1.7 10*3/uL (ref 0.7–4.0)
LYMPHS PCT: 33 % (ref 12–46)
MCH: 26.8 pg (ref 26.0–34.0)
MCHC: 33.1 g/dL (ref 30.0–36.0)
MCV: 80.8 fL (ref 78.0–100.0)
MONO ABS: 0.5 10*3/uL (ref 0.1–1.0)
MONOS PCT: 9 % (ref 3–12)
MPV: 8.9 fL — ABNORMAL LOW (ref 9.4–12.4)
NEUTROS ABS: 2.9 10*3/uL (ref 1.7–7.7)
Neutrophils Relative %: 56 % (ref 43–77)
Platelets: 365 10*3/uL (ref 150–400)
RBC: 4.37 MIL/uL (ref 3.87–5.11)
RDW: 15 % (ref 11.5–15.5)
WBC: 5.2 10*3/uL (ref 4.0–10.5)

## 2014-09-14 MED ORDER — BUTALBITAL-APAP-CAFFEINE 50-325-40 MG PO TABS: ORAL_TABLET | ORAL | Status: DC

## 2014-09-14 MED ORDER — BACLOFEN 10 MG PO TABS: ORAL_TABLET | ORAL | Status: DC

## 2014-09-14 NOTE — Patient Instructions (Addendum)
Sinus pain could be inflammation/allergies get on allergy pill, I suggest certizine listed below taken at night. If white blood cell count is elevated I will send in an antibiotic. Please do the TMJ exercises at the end of this sheet, and can try the baclofen 1-2 pills at night for sleep/jaw pain.   Please pick one of the over the counter allergy medications below and take it once daily for allergies.  Claritin or loratadine cheapest but likely the weakest  Zyrtec or certizine at night because it can make you sleepy The strongest is allegra or fexafinadine  Cheapest at [employer], [employer], [employer]   What is the TMJ? The temporomandibular (tem-[id number]-ro-man-DIB-[id number]-ler) joint, or the TMJ, connects the upper and lower jawbones. This joint allows the jaw to open wide and move back and forth when you chew, talk, or yawn.There are also several muscles that help this joint move. There can be muscle tightness and pain in the muscle that can cause several symptoms.  What causes TMJ pain? There are many causes of TMJ pain. Repeated chewing (for example, chewing gum) and clenching your teeth can cause pain in the joint. Some TMJ pain has no obvious cause. What can I do to ease the pain? There are many things you can do to help your pain get better. When you have pain:  Eat soft foods and stay away from chewy foods (for example, taffy) Try to use both sides of your mouth to chew Don't chew gum Massage Don't open your mouth wide (for example, during yawning or singing) Don't bite your cheeks or fingernails Lower your amount of stress and worry Applying a warm, damp washcloth to the joint may help. Over-the-counter pain medicines such as ibuprofen (one brand: Advil) or acetaminophen (one brand: Tylenol) might also help. Do not use these medicines if you are allergic to them or if your doctor told you not to use them. How can I stop the pain from coming back? When your pain is better, you can do these exercises  to make your muscles stronger and to keep the pain from coming back:  Resisted mouth opening: Place your thumb or two fingers under your chin and open your mouth slowly, pushing up lightly on your chin with your thumb. Hold for three to six seconds. Close your mouth slowly. Resisted mouth closing: Place your thumbs under your chin and your two index fingers on the ridge between your mouth and the bottom of your chin. Push down lightly on your chin as you close your mouth. Tongue up: Slowly open and close your mouth while keeping the tongue touching the roof of the mouth. Side-to-side jaw movement: Place an object about one fourth of an inch thick (for example, two tongue depressors) between your front teeth. Slowly move your jaw from side to side. Increase the thickness of the object as the exercise becomes easier Forward jaw movement: Place an object about one fourth of an inch thick between your front teeth and move the bottom jaw forward so that the bottom teeth are in front of the top teeth. Increase the thickness of the object as the exercise becomes easier. These exercises should not be painful. If it hurts to do these exercises, stop doing them and talk to your family doctor.

## 2014-09-14 NOTE — Progress Notes (Signed)
MEDICARE ANNUAL WELLNESS VISIT AND FOLLOW UP  Assessment:   1. Essential hypertension - continue medications, DASH diet, exercise and monitor at home. Call if greater than 130/80.  - CBC with Differential - BASIC METABOLIC PANEL WITH GFR - Hepatic function panel  2. Toxic multinodular goiter Continue follow up with Dr. Darnell Level  3. Prediabetes Discussed general issues about diabetes pathophysiology and management., Educational material distributed., Suggested low cholesterol diet., Encouraged aerobic exercise., Discussed foot care., Reminded to get yearly retinal exam. - Hemoglobin A1c - Insulin, fasting - HM DIABETES FOOT EXAM  4. Vitamin D deficiency - Vit D  25 hydroxy (rtn osteoporosis monitoring)  5. Hyperlipidemia -continue medications, check lipids, decrease fatty foods, increase activity.  - Lipid panel  6. Medication management - Magnesium  7. Insomnia Insomnia- good sleep hygiene discussed, increase day time activity, try melatonin or benadryl if this does not help we will call in sleep medication. Can try zyrtec or baclofen at night too.   8. Poor compliance with medication Take medications prescribed.   9. Anemia, unspecified anemia type - monitor, continue iron supp with Vitamin C and increase green leafy veggies  10. Low back pain without sciatica, unspecified back pain laterality Continue follow up ortho  11. Benign paroxysmal positional vertigo, bilateral Continue meclizine PRN  12. TMJ (temporomandibular joint syndrome VS sinus/allergies information given to the patient, no gum/decrease hard foods, warm wet wash clothes, decrease stress, talk with dentist about possible night guard, can do massage, and exercise.  - baclofen (LIORESAL) 10 MG tablet; 1-2 at night for jaw pain/headache  Dispense: 60 tablet If + WBC count will send in ABX, dymista samples given.   13. Need for prophylactic vaccination against Streptococcus pneumoniae (pneumococcus) -  Pneumococcal conjugate vaccine 13-valent IM   Plan:   During the course of the visit the patient was educated and counseled about appropriate screening and preventive services including:    Pneumococcal vaccine   Influenza vaccine  Td vaccine  Screening electrocardiogram  Screening mammography  Bone densitometry screening  Colorectal cancer screening  Diabetes screening  Glaucoma screening  Nutrition counseling   Advanced directives: given info/requested  Screening recommendations, referrals:  Vaccinations: Please see documentation below and orders this visit.   Nutrition assessed and recommended  Colonoscopy due 2017 Mammogram up to date Pap smear not indicated Pelvic exam not indicated Recommended yearly ophthalmology/optometry visit for glaucoma screening and checkup Recommended yearly dental visit for hygiene and checkup Advanced directives - requested  Conditions/risks identified: BMI: Discussed weight loss, diet, and increase physical activity.  Increase physical activity: AHA recommends 150 minutes of physical activity a week.  Medications reviewed DEXA- requested Diabetes is at goal, ACE/ARB therapy: No, Reason not on Ace Inhibitor/ARB therapy:  preDM Urinary Incontinence is not an issue: discussed non pharmacology and pharmacology options.  Fall risk: low- discussed PT, home fall assessment, medications.    Subjective:   Laura Maynard is a 68 y.o. female who presents for Medicare Annual Wellness Visit and 3 month follow up on hypertension, prediabetes, hyperlipidemia, vitamin D def.  Date of last medicare wellness visit is unknown.   Her blood pressure has been controlled at home, today their BP is BP: 120/68 mmHg She does workout when she can, goes rec center 2 x week. She denies chest pain, shortness of breath, dizziness.  She is not on cholesterol medication and denies myalgias. Her cholesterol is at goal. The cholesterol last visit was:    Lab Results  Component Value Date  CHOL 228* 05/30/2014   HDL 98 05/30/2014   LDLCALC 117* 05/30/2014   TRIG 67 05/30/2014   CHOLHDL 2.3 05/30/2014   She has been working on diet and exercise for prediabetes, and denies polydipsia, polyuria and visual disturbances. Last A1C in the office was:  Lab Results  Component Value Date   HGBA1C 5.8* 05/30/2014   Patient is on Vitamin D supplement. Lab Results  Component Value Date   VD25OH 33 05/30/2014     She had low TSH due to toxic MNG s/p RAI tx and follows with Dr. Cruzita Lederer.  Lab Results  Component Value Date   TSH 0.55 09/08/2014   Had a diagnosis of sarcoid in 1994 however most recent CT of chest did not show any evidence of it.  She has a history of migraines and is on fiorcet and sumatriptan. She states she has been using fiorcet for sinus headache recently. She has had sinus pressure, pain in her ears, and neck.  + TMJ but can not afford night guard.  Has history of vertigo, meclizine helps.   Names of Other Physician/Practitioners you currently use: 1. [facility]- here for primary care 2. Dr. Merita Norton at [facility], eye doctor, last visit June 2015 3. Dr. Elvina Mattes , dentist, last visit q 6 months, having crown put in now Patient Care Team: [name], MD as PCP - General (Internal Medicine) Charlyne Mom, MD (Dermatology) Philemon Kingdom, MD as Consulting Physician (Internal Medicine) Marcial Pacas, MD as Consulting Physician (Neurology) Gatha Mayer, MD as Consulting Physician (Gastroenterology) Rozetta Nunnery, MD as Consulting Physician (Otolaryngology) Gearlean Alf, MD as Consulting Physician (Orthopedic Surgery)  Medication Review Current Outpatient Prescriptions on File Prior to Visit  Medication Sig Dispense Refill  . ALPRAZolam (XANAX) 1 MG tablet Take 1 tablet (1 mg total) by mouth at bedtime as needed for sleep. 30 tablet 5  . aspirin 81 MG tablet Take 81  mg by mouth daily.      . butalbital-acetaminophen-caffeine (FIORICET, ESGIC) 50-325-40 MG per tablet TAKE 1-2 TABLETS BY MOUTH EVERY 4 HOURS AS NEEDED, MAX 6 PER DAY 50 tablet 0  . cholecalciferol (VITAMIN D) 1000 UNITS tablet Take 1,000 Units by mouth daily.    . meclizine (ANTIVERT) 25 MG tablet TAKE 1 TABLET BY MOUTH THREE TIMES DAILY FOR VERTIGO 100 tablet 0  . Naproxen Sodium (ALEVE PO) Take 1 tablet by mouth as needed.      . SUMAtriptan Succinate 6 MG/0.5ML SOTJ Inject subcutaneous into skin as needed for migraines 6 Device 0   No current facility-administered medications on file prior to visit.    Current Problems (verified) Patient Active Problem List   Diagnosis Date Noted  . Insomnia 07/08/2014  . Poor compliance with medication 05/29/2014  . 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 Hyperlipidemia 02/08/2014  . Encounter for long-term (current) use of other medications 02/08/2014  . Hypertension   . Prediabetes   . Vitamin D deficiency   . Anemia   . Low back pain 07/13/2013  . Chorioretinal scar, 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    Screening Tests Health Maintenance  Topic Date Due  . INFLUENZA VACCINE  04/30/2014  . COLONOSCOPY  03/27/2016  . MAMMOGRAM  05/04/2016  . TETANUS/TDAP  10/15/2021  . PNEUMOCOCCAL POLYSACCHARIDE VACCINE AGE 93 AND OVER  Completed  . ZOSTAVAX  Completed     Immunization History  Administered Date(s) Administered  . Influenza Split 06/30/2013  . Pneumococcal Polysaccharide-23 10/16/2011  .  Td 10/16/2011  . Zoster 01/14/2007    Preventative care: Last colonoscopy: 03/2011 due 2017 Last mammogram: 04/2014 Last pap smear/pelvic exam: remote DEXA: has had with kidney doctor RAI thyroid therapy 2015 CTA chest 2014 normal  Prior vaccinations: TD or Tdap: 2013  Influenza: 2015 at [facility] Pneumococcal: 2013 Prevnar13: DUE  Shingles/Zostavax: 2008  History reviewed: allergies, current medications, past family history, past medical history, past  social history, past surgical history and problem list    Medication List       This list is accurate as of: 09/14/14  1:38 PM.  Always use your most recent med list.               ALEVE PO  Take 1 tablet by mouth as needed.     ALPRAZolam 1 MG tablet  Commonly known as:  XANAX  Take 1 tablet (1 mg total) by mouth at bedtime as needed for sleep.     aspirin 81 MG tablet  Take 81 mg by mouth daily.     butalbital-acetaminophen-caffeine 50-325-40 MG per tablet  Commonly known as:  FIORICET, ESGIC  TAKE 1-2 TABLETS BY MOUTH EVERY 4 HOURS AS NEEDED, MAX 6 PER DAY     cholecalciferol 1000 UNITS tablet  Commonly known as:  VITAMIN D  Take 1,000 Units by mouth daily.     meclizine 25 MG tablet  Commonly known as:  ANTIVERT  TAKE 1 TABLET BY MOUTH THREE TIMES DAILY FOR VERTIGO     SUMAtriptan Succinate 6 MG/0.5ML Sotj  Inject subcutaneous into skin as needed for migraines        Past Surgical History  Procedure Laterality Date  . Eye surgery  1964    right eye  . Back surgery  1970  . Nasal septum surgery  1998  . Total abdominal hysterectomy  2000  . Knee arthroscopy  2002  . Colonoscopy  2006; 03/28/11    2 small adenomas; normal   Family History  Problem Relation Age of Onset  . Lung cancer Sister 40  . Throat cancer Brother 44  . Colon cancer Neg Hx   . COPD Sister   . Diabetes Brother   . Stroke Mother   . Emphysema Father     Risk Factors: Osteoporosis/FallRisk: postmenopausal estrogen deficiency and dietary calcium and/or vitamin D deficiency In the past year have you fallen or had a near fall?:No History of fracture in the past year: no  Tobacco History  Substance Use Topics  . Smoking status: Never Smoker   . Smokeless tobacco: Never Used  . Alcohol Use: No     Comment: RARE   She does not smoke.  Patient is not a former smoker. Are there smokers in your home (other than you)?  No  Alcohol Current alcohol use: rare  Caffeine Current  caffeine use: coffee 1 /day  Exercise Current exercise: walking  Nutrition/Diet Current diet: in general, a "healthy" diet    Cardiac risk factors: advanced age (older than 32 for men, 19 for women), dyslipidemia and hypertension.  Depression Screen (Note: if answer to either of the following is "Yes", a more complete depression screening is indicated)   Q1: Over the past two weeks, have you felt down, depressed or hopeless? No  Q2: Over the past two weeks, have you felt little interest or pleasure in doing things? No  Have you lost interest or pleasure in daily life? No  Do you often feel hopeless? No  Do you cry  easily over simple problems? No  Activities of Daily Living In your present state of health, do you have any difficulty performing the following activities?:  Driving? No Managing money?  No Feeding yourself? No Getting from bed to chair? No Climbing a flight of stairs? No Preparing food and eating?: No Bathing or showering? No Getting dressed: No Getting to the toilet? No Using the toilet:No Moving around from place to place: No   Are you sexually active?  Yes  Do you have more than one partner?  No  Vision Difficulties: Yes, needs cataract removed  Hearing Difficulties: Yes Do you often ask people to speak up or repeat themselves? Yes Do you experience ringing or noises in your ears? No Do you have difficulty understanding soft or whispered voices? No  Cognition  Do you feel that you have a problem with memory?Yes  Do you often misplace items? No  Do you feel safe at home?  Yes  Advanced directives Does patient have a [place]er? Yes Does patient have a Living Will? Yes   Objective:   Blood pressure 120/68, pulse 72, temperature 98.2 F (36.8 C), resp. rate 16, height 5' 6.75" (1.695 m), weight 153 lb (69.4 kg). Body mass index is 24.16 kg/(m^2).  General appearance: alert, no distress, WD/WN,  female Cognitive  Testing  Alert? Yes  Normal Appearance?Yes  Oriented to person? Yes  Place? Yes   Time? Yes  Recall of three objects?  Yes  Can perform simple calculations? Yes  Displays appropriate judgment?Yes  Can read the correct time from a watch face?Yes  HEENT: normocephalic, sclerae anicteric, TMs pearly, nares patent, no discharge or erythema, pharynx normal, + TMJ tenderness, + maxillary sinus tenderness.  Oral cavity: MMM, no lesions Neck: supple, no lymphadenopathy, no thyromegaly, no masses Heart: RRR, normal S1, S2, no murmurs Lungs: CTA bilaterally, no wheezes, rhonchi, or rales Abdomen: +bs, soft, non tender, non distended, no masses, no hepatomegaly, no splenomegaly Musculoskeletal: nontender, no swelling, no obvious deformity Extremities: no edema, no cyanosis, no clubbing Pulses: 2+ symmetric, upper and lower extremities, normal cap refill Neurological: alert, oriented x 3, CN2-12 intact, strength normal upper extremities and lower extremities, sensation normal throughout, DTRs 2+ throughout, no cerebellar signs, gait normal Psychiatric: normal affect, behavior normal, pleasant  Breast: defer Gyn: defer Rectal: defer  Medicare Attestation I have personally reviewed: The patient's medical and social history Their use of alcohol, tobacco or illicit drugs Their current medications and supplements The patient's functional ability including ADLs,fall risks, home safety risks, cognitive, and hearing and visual impairment Diet and physical activities Evidence for depression or mood disorders  The patient's weight, height, BMI, and visual acuity have been recorded in the chart.  I have made referrals, counseling, and provided education to the patient based on review of the above and I have provided the patient with a written personalized care plan for preventive services.     Vicie Mutters, PA-C   09/14/2014

## 2014-09-15 LAB — HEPATIC FUNCTION PANEL
ALK PHOS: 104 U/L (ref 39–117)
ALT: 11 U/L (ref 0–35)
AST: 18 U/L (ref 0–37)
Albumin: 4.3 g/dL (ref 3.5–5.2)
BILIRUBIN INDIRECT: 0.3 mg/dL (ref 0.2–1.2)
Bilirubin, Direct: 0.1 mg/dL (ref 0.0–0.3)
TOTAL PROTEIN: 7 g/dL (ref 6.0–8.3)
Total Bilirubin: 0.4 mg/dL (ref 0.2–1.2)

## 2014-09-15 LAB — VITAMIN D 25 HYDROXY (VIT D DEFICIENCY, FRACTURES): Vit D, 25-Hydroxy: 19 ng/mL — ABNORMAL LOW (ref 30–100)

## 2014-09-15 LAB — BASIC METABOLIC PANEL WITH GFR
BUN: 9 mg/dL (ref 6–23)
CO2: 25 mEq/L (ref 19–32)
Calcium: 9.8 mg/dL (ref 8.4–10.5)
Chloride: 107 mEq/L (ref 96–112)
Creat: 0.74 mg/dL (ref 0.50–1.10)
GFR, Est African American: >89 mL/min
GFR, Est Non African American: 84 mL/min
GLUCOSE: 79 mg/dL (ref 70–99)
POTASSIUM: 4.1 meq/L (ref 3.5–5.3)
Sodium: 143 mEq/L (ref 135–145)

## 2014-09-15 LAB — HEMOGLOBIN A1C
Hgb A1c MFr Bld: 5.4 % (ref <5.7)
Mean Plasma Glucose: 108 mg/dL (ref <117)

## 2014-09-15 LAB — LIPID PANEL
Cholesterol: 274 mg/dL — ABNORMAL HIGH (ref 0–200)
HDL: 130 mg/dL (ref >39)
LDL CALC: 132 mg/dL — AB (ref 0–99)
Total CHOL/HDL Ratio: 2.1 Ratio
Triglycerides: 60 mg/dL (ref <150)
VLDL: 12 mg/dL (ref 0–40)

## 2014-09-15 LAB — INSULIN, FASTING: INSULIN FASTING, SERUM: 3 u[IU]/mL (ref 2.0–19.6)

## 2014-09-15 LAB — MAGNESIUM: Magnesium: 2.8 mg/dL — ABNORMAL HIGH (ref 1.5–2.5)

## 2014-10-27 ENCOUNTER — Encounter: Payer: Self-pay | Admitting: Internal Medicine

## 2014-11-08 ENCOUNTER — Ambulatory Visit (INDEPENDENT_AMBULATORY_CARE_PROVIDER_SITE_OTHER): Payer: Medicare Other | Admitting: Internal Medicine

## 2014-11-08 ENCOUNTER — Encounter: Payer: Self-pay | Admitting: Internal Medicine

## 2014-11-08 VITALS — BP 110/64 | HR 83 | Temp 97.9°F | Resp 12 | Wt 154.0 lb

## 2014-11-08 DIAGNOSIS — E0520 Thyrotoxicosis with toxic multinodular goiter without thyrotoxic crisis or storm: Secondary | ICD-10-CM

## 2014-11-08 LAB — TSH: TSH: 25.24 u[IU]/mL — ABNORMAL HIGH (ref 0.35–4.50)

## 2014-11-08 LAB — T4, FREE: FREE T4: 0.38 ng/dL — AB (ref 0.60–1.60)

## 2014-11-08 LAB — T3, FREE: T3 FREE: 2.5 pg/mL (ref 2.3–4.2)

## 2014-11-08 MED ORDER — LEVOTHYROXINE SODIUM 50 MCG PO TABS: 50.0000 ug | ORAL_TABLET | Freq: Every day | ORAL | Status: DC

## 2014-11-08 NOTE — Progress Notes (Signed)
Patient ID: Laura Maynard, female   DOB: September 24, 1946, 69 y.o.   MRN: [id number]   HPI  Laura Maynard is a 69 y.o.-year-old female, returning for f/u for thyrotoxycosis due to toxic multinodular goiter (TMNG). Last visit 1.5 mo ago.  Reviewed hx:  Pt had screening labs in 01/2014 and was found to have a low TSH. This was repeated 2x and the TSH continued to decrease.   She was sent for a thyroid Uptake and scan (05/20/2014) >> uptake 48.5% and scan c/w TMNG.  We started MMI 5 mg bid.  Had RAI tx on 06/09/2014.   After RAI, she stopped the MMI, but we had to restart 2/2 abnormal tests >> she had joint pain and insomnia in 07/2014 >> I advised her to stop MMI >> TFTs normal 1 mo after stopping MMI - see below.  I reviewed pt's thyroid tests: Lab Results  Component Value Date   TSH 0.55 09/08/2014   TSH 0.19* 08/08/2014   TSH 0.00* 07/08/2014   TSH 0.03* 05/27/2014   TSH 0.009* 05/02/2014   TSH 0.042* 03/30/2014   TSH 0.079* 02/08/2014   TSH 0.729 10/27/2013   FREET4 0.71 09/08/2014   FREET4 0.88 08/08/2014   FREET4 1.79* 07/08/2014   FREET4 1.23 05/27/2014    Pt denies feeling nodules in neck, hoarseness, dysphagia/odynophagia, SOB with lying down; she c/o: - resolved insomnia - resolved weight loss - + heat intolerance (hot flushes) - no tremors - no anxiety - no fatigue - + hair loss - + dry skin - no hyperdefecation, + constipation   She also has a history of HTN, HL, preDM, vit D def., anemia.  ROS: Constitutional: see HPI Eyes: no blurry vision, no xerophthalmia ENT: no sore throat, no nodules palpated in throat, no dysphagia/odynophagia, no hoarseness Cardiovascular: no CP/SOB/palpitations/leg swelling Respiratory: occasional cough/no SOB Gastrointestinal: no N/V/D/+ C/no heartburn Musculoskeletal: no muscle aches/no joint aches Skin: no rashes, + excess hair on chin; + hair loss and dry skin Neurological: no tremors/numbness/tingling/dizziness  I reviewed  pt's medications, allergies, PMH, social hx, family hx, and changes were documented in the history of present illness. Otherwise, unchanged from my initial visit note.  PE: BP 110/64 mmHg  Pulse 83  Temp(Src) 97.9 F (36.6 C) (Oral)  Resp 12  Wt 154 lb (69.854 kg)  SpO2 96% Wt Readings from Last 3 Encounters:  11/08/14 154 lb (69.854 kg)  09/14/14 153 lb (69.4 kg)  07/08/14 152 lb (68.947 kg)   Constitutional: normal weight, in NAD Eyes: surgical pupil R eye, EOMI, no exophthalmos, no lid lag, no stare ENT: moist mucous membranes, no thyromegaly, no thyroid bruits, no cervical lymphadenopathy Cardiovascular: RRR, + 1/6 SEM, no RG, no LE edema Respiratory: CTA B Gastrointestinal: abdomen soft, NT, ND, BS+ Musculoskeletal: no deformities, strength intact in all 4 Skin: moist, warm, no rashes Neurological: no tremor with outstretched hands, DTR normal in all 4  ASSESSMENT: 1. Toxic MNG - s/p RAI tx  PLAN:  1. Patient with h/o thyrotoxicosis 2/2 TMNG, previously with some thyrotoxic sxs: weight loss, anxiety, insomnia; now all resolved after the RAI tx. - will check the TSH, fT3 and fT4 again today - I explained that sometimes we can see an increase in thyroid hormones right after the RAI tx, which usually improves >> we had to use MMI after RAI for a short time >> TFTs normal 1 mo after stopping it >> I am hopeful that her hyperthyroidism is resolved after RAI Tx. -  discussed that she may develop hypothyroidism >> we discussed how to take the LT4 correctly if we need to start. She will need to take the thyroid hormone every day, with water, >30 minutes before breakfast, separated by >4 hours from acid reflux medications, calcium, iron, multivitamins. - RTC in 4 months  Office Visit on 11/08/2014  Component Date Value Ref Range Status  . T3, Free 11/08/2014 2.5  2.3 - 4.2 pg/mL Final  . Free T4 11/08/2014 0.38* 0.60 - 1.60 ng/dL Final  . TSH 11/08/2014 25.24* 0.35 - 4.50 uIU/mL  Final   She unfortunately developed hypothyroidism after her radioactive iodine treatment. We will need to start levothyroxine, will start at 50 g daily and will recheck thyroid tests in 5 weeks.

## 2014-11-08 NOTE — Patient Instructions (Signed)
Please stop at the lab.  Please come back for a follow-up appointment in 4 months.   

## 2014-12-14 ENCOUNTER — Ambulatory Visit (INDEPENDENT_AMBULATORY_CARE_PROVIDER_SITE_OTHER): Payer: Medicare Other

## 2014-12-14 DIAGNOSIS — E0520 Thyrotoxicosis with toxic multinodular goiter without thyrotoxic crisis or storm: Secondary | ICD-10-CM

## 2014-12-14 LAB — TSH: TSH: 3.75 u[IU]/mL (ref 0.35–4.50)

## 2014-12-14 LAB — T4, FREE: Free T4: 0.69 ng/dL (ref 0.60–1.60)

## 2014-12-20 ENCOUNTER — Ambulatory Visit (INDEPENDENT_AMBULATORY_CARE_PROVIDER_SITE_OTHER): Payer: Medicare Other | Admitting: Internal Medicine

## 2014-12-20 ENCOUNTER — Encounter: Payer: Self-pay | Admitting: Internal Medicine

## 2014-12-20 VITALS — BP 118/70 | HR 80 | Temp 98.2°F | Resp 16 | Ht 66.75 in | Wt 154.0 lb

## 2014-12-20 DIAGNOSIS — R109 Unspecified abdominal pain: Secondary | ICD-10-CM | POA: Diagnosis not present

## 2014-12-20 LAB — CBC WITH DIFFERENTIAL/PLATELET
BASOS PCT: 1 % (ref 0–1)
Basophils Absolute: 0 10*3/uL (ref 0.0–0.1)
Eosinophils Absolute: 0 10*3/uL (ref 0.0–0.7)
Eosinophils Relative: 1 % (ref 0–5)
HCT: 37.2 % (ref 36.0–46.0)
HEMOGLOBIN: 12.2 g/dL (ref 12.0–15.0)
Lymphocytes Relative: 37 % (ref 12–46)
Lymphs Abs: 1.7 10*3/uL (ref 0.7–4.0)
MCH: 27.9 pg (ref 26.0–34.0)
MCHC: 32.8 g/dL (ref 30.0–36.0)
MCV: 84.9 fL (ref 78.0–100.0)
MPV: 8.8 fL (ref 8.6–12.4)
Monocytes Absolute: 0.5 10*3/uL (ref 0.1–1.0)
Monocytes Relative: 10 % (ref 3–12)
NEUTROS PCT: 51 % (ref 43–77)
Neutro Abs: 2.3 10*3/uL (ref 1.7–7.7)
Platelets: 370 10*3/uL (ref 150–400)
RBC: 4.38 MIL/uL (ref 3.87–5.11)
RDW: 14.3 % (ref 11.5–15.5)
WBC: 4.6 10*3/uL (ref 4.0–10.5)

## 2014-12-20 LAB — BASIC METABOLIC PANEL
BUN: 8 mg/dL (ref 6–23)
CHLORIDE: 104 meq/L (ref 96–112)
CO2: 30 mEq/L (ref 19–32)
Calcium: 9.4 mg/dL (ref 8.4–10.5)
Creat: 0.74 mg/dL (ref 0.50–1.10)
Glucose, Bld: 84 mg/dL (ref 70–99)
POTASSIUM: 4.3 meq/L (ref 3.5–5.3)
SODIUM: 139 meq/L (ref 135–145)

## 2014-12-20 MED ORDER — TRAMADOL HCL 50 MG PO TABS
50.0000 mg | ORAL_TABLET | Freq: Four times a day (QID) | ORAL | Status: DC | PRN
Start: 2014-12-20 — End: 2014-12-27

## 2014-12-20 MED ORDER — CIPROFLOXACIN HCL 500 MG PO TABS: 500.0000 mg | ORAL_TABLET | Freq: Two times a day (BID) | ORAL | Status: DC

## 2014-12-20 NOTE — Progress Notes (Signed)
Subjective:    Patient ID: Laura Maynard, female    DOB: 11/25/1945, 69 y.o.   MRN: [id number]  Back Pain Pertinent negatives include no abdominal pain, dysuria or fever.   Patient presents to the office for right sided flank pain and lower abdominal pain which started yesterday.  She reports that the pain is annoying gnawing sensation that is constant.  She reports that it is worse when she is moving.  It moves from her flank to under RLQ and suprapubic area.  She reports that the pain is a 6/10 pain.  The pain is relatively constant.  No injury she can think of.  No history of UTI or pyelonephritis.  She reports that her urine has had more of a foul odor lately.  She denies urinary frequency or urgency.  She reports no pain with urination.  She reports no history of kidney stones.     Review of Systems  Constitutional: Negative for fever, chills and fatigue.  Gastrointestinal: Positive for constipation. Negative for nausea, vomiting, abdominal pain, diarrhea and anal bleeding.  Genitourinary: Positive for flank pain. Negative for dysuria, urgency, frequency, hematuria and difficulty urinating.  Musculoskeletal: Positive for back pain.       Objective:   Physical Exam  Constitutional: She is oriented to person, place, and time. She appears well-developed and well-nourished. No distress.  HENT:  Head: Normocephalic and atraumatic.  Mouth/Throat: No oropharyngeal exudate.  Eyes: Conjunctivae and EOM are normal. Pupils are equal, round, and reactive to light. No scleral icterus.  Neck: Normal range of motion. Neck supple. No JVD present. Thyromegaly present.  Cardiovascular: Normal rate, regular rhythm, normal heart sounds and intact distal pulses.  Exam reveals no gallop and no friction rub.   No murmur heard. Pulmonary/Chest: Effort normal and breath sounds normal. No respiratory distress. She has no wheezes. She has no rales. She exhibits no tenderness.  Abdominal: Soft. Normal  appearance and bowel sounds are normal. She exhibits no distension and no mass. There is tenderness (minimal tenderness to palpation) in the right upper quadrant and suprapubic area. There is no rigidity, no rebound, no guarding, no CVA tenderness, no tenderness at McBurney's point and negative Murphy's sign.  Musculoskeletal: Normal range of motion.  No tenderness to palpation of the back  Lymphadenopathy:    She has no cervical adenopathy.  Neurological: She is alert and oriented to person, place, and time.  Skin: Skin is warm and dry. She is not diaphoretic.  Psychiatric: She has a normal mood and affect. Her behavior is normal. Judgment and thought content normal.  Nursing note and vitals reviewed.         Assessment & Plan:    1. Flank pain Patient with flank pain and back pain.  Physical exam reveals minimal tenderness to palpation of the abdomen.  Patient does report some constipation.  Miralax samples given. Ddx includes UTI vs. Early pyelonephritis, possible nephrolithiasis, constipation, and less likely appendicitis.  Do not feel imaging indicated at this time.  Will get labs and urine and will start on Cipro for possible UTI.     - ciprofloxacin (CIPRO) 500 MG tablet; Take 1 tablet (500 mg total) by mouth 2 (two) times daily. One po bid x 7 days  Dispense: 14 tablet; Refill: 0 - traMADol (ULTRAM) 50 MG tablet; Take 1 tablet (50 mg total) by mouth every 6 (six) hours as needed.  Dispense: 20 tablet; Refill: 0 - Urinalysis, Routine w reflex microscopic - Culture, Urine -  CBC with Differential/Platelet - Basic metabolic panel

## 2014-12-20 NOTE — Patient Instructions (Signed)
Flank Pain °Flank pain refers to pain that is located on the side of the body between the upper abdomen and the back. The pain may occur over a short period of time (acute) or may be long-term or reoccurring (chronic). It may be mild or severe. Flank pain can be caused by many things. °CAUSES  °Some of the more common causes of flank pain include: °· Muscle strains.   °· Muscle spasms.   °· A disease of your spine (vertebral disk disease).   °· A lung infection (pneumonia).   °· Fluid around your lungs (pulmonary edema).   °· A kidney infection.   °· Kidney stones.   °· A very painful skin rash caused by the chickenpox virus (shingles).   °· Gallbladder disease.   °HOME CARE INSTRUCTIONS  °Home care will depend on the cause of your pain. In general, °· Rest as directed by your caregiver. °· Drink enough fluids to keep your urine clear or pale yellow. °· Only take over-the-counter or prescription medicines as directed by your caregiver. Some medicines may help relieve the pain. °· Tell your caregiver about any changes in your pain. °· Follow up with your caregiver as directed. °SEEK IMMEDIATE MEDICAL CARE IF:  °· Your pain is not controlled with medicine.   °· You have new or worsening symptoms. °· Your pain increases.   °· You have abdominal pain.   °· You have shortness of breath.   °· You have persistent nausea or vomiting.   °· You have swelling in your abdomen.   °· You feel faint or pass out.   °· You have blood in your urine. °· You have a fever or persistent symptoms for more than 2-3 days. °· You have a fever and your symptoms suddenly get worse. °MAKE SURE YOU:  °· Understand these instructions. °· Will watch your condition. °· Will get help right away if you are not doing well or get worse. °Document Released: 11/07/2005 Document Revised: 06/10/2012 Document Reviewed: 04/30/2012 °[employer]® Patient Information ©2015 [employer]. This information is not intended to replace advice given to you by your  health care provider. Make sure you discuss any questions you have with your health care provider. ° °

## 2014-12-21 LAB — URINE CULTURE
Colony Count: NO GROWTH
Organism ID, Bacteria: NO GROWTH

## 2014-12-21 LAB — URINALYSIS, ROUTINE W REFLEX MICROSCOPIC
Bilirubin Urine: NEGATIVE
GLUCOSE, UA: NEGATIVE mg/dL
Ketones, ur: NEGATIVE mg/dL
LEUKOCYTES UA: NEGATIVE
NITRITE: NEGATIVE
Protein, ur: NEGATIVE mg/dL
Specific Gravity, Urine: 1.018 (ref 1.005–1.030)
UROBILINOGEN UA: 0.2 mg/dL (ref 0.0–1.0)
pH: 5 (ref 5.0–8.0)

## 2014-12-21 LAB — URINALYSIS, MICROSCOPIC ONLY
BACTERIA UA: NONE SEEN
Casts: NONE SEEN
Crystals: NONE SEEN
Squamous Epithelial / LPF: NONE SEEN

## 2014-12-27 ENCOUNTER — Encounter: Payer: Self-pay | Admitting: Internal Medicine

## 2014-12-27 ENCOUNTER — Ambulatory Visit (INDEPENDENT_AMBULATORY_CARE_PROVIDER_SITE_OTHER): Payer: Medicare Other | Admitting: Internal Medicine

## 2014-12-27 VITALS — BP 102/62 | HR 84 | Temp 97.5°F | Resp 16 | Ht 66.25 in | Wt 154.2 lb

## 2014-12-27 DIAGNOSIS — E559 Vitamin D deficiency, unspecified: Secondary | ICD-10-CM | POA: Diagnosis not present

## 2014-12-27 DIAGNOSIS — E032 Hypothyroidism due to medicaments and other exogenous substances: Secondary | ICD-10-CM

## 2014-12-27 DIAGNOSIS — I10 Essential (primary) hypertension: Secondary | ICD-10-CM | POA: Diagnosis not present

## 2014-12-27 DIAGNOSIS — Z79899 Other long term (current) drug therapy: Secondary | ICD-10-CM

## 2014-12-27 DIAGNOSIS — E782 Mixed hyperlipidemia: Secondary | ICD-10-CM

## 2014-12-27 DIAGNOSIS — R7309 Other abnormal glucose: Secondary | ICD-10-CM | POA: Diagnosis not present

## 2014-12-27 DIAGNOSIS — Z91148 Patient's other noncompliance with medication regimen for other reason: Secondary | ICD-10-CM

## 2014-12-27 DIAGNOSIS — Z1212 Encounter for screening for malignant neoplasm of rectum: Secondary | ICD-10-CM

## 2014-12-27 DIAGNOSIS — R7303 Prediabetes: Secondary | ICD-10-CM

## 2014-12-27 DIAGNOSIS — Z9181 History of falling: Secondary | ICD-10-CM

## 2014-12-27 DIAGNOSIS — Z1331 Encounter for screening for depression: Secondary | ICD-10-CM

## 2014-12-27 DIAGNOSIS — Z9114 Patient's other noncompliance with medication regimen: Secondary | ICD-10-CM

## 2014-12-27 LAB — MAGNESIUM: MAGNESIUM: 2.2 mg/dL (ref 1.5–2.5)

## 2014-12-27 LAB — CBC WITH DIFFERENTIAL/PLATELET
Basophils Absolute: 0 10*3/uL (ref 0.0–0.1)
Basophils Relative: 0 % (ref 0–1)
Eosinophils Absolute: 0.1 10*3/uL (ref 0.0–0.7)
Eosinophils Relative: 1 % (ref 0–5)
HEMATOCRIT: 35.4 % — AB (ref 36.0–46.0)
HEMOGLOBIN: 11.6 g/dL — AB (ref 12.0–15.0)
Lymphocytes Relative: 36 % (ref 12–46)
Lymphs Abs: 1.8 10*3/uL (ref 0.7–4.0)
MCH: 28.1 pg (ref 26.0–34.0)
MCHC: 32.8 g/dL (ref 30.0–36.0)
MCV: 85.7 fL (ref 78.0–100.0)
MPV: 8.9 fL (ref 8.6–12.4)
Monocytes Absolute: 0.5 10*3/uL (ref 0.1–1.0)
Monocytes Relative: 9 % (ref 3–12)
NEUTROS PCT: 54 % (ref 43–77)
Neutro Abs: 2.8 10*3/uL (ref 1.7–7.7)
PLATELETS: 340 10*3/uL (ref 150–400)
RBC: 4.13 MIL/uL (ref 3.87–5.11)
RDW: 14.7 % (ref 11.5–15.5)
WBC: 5.1 10*3/uL (ref 4.0–10.5)

## 2014-12-27 LAB — HEPATIC FUNCTION PANEL
ALT: 12 U/L (ref 0–35)
AST: 19 U/L (ref 0–37)
Albumin: 4.4 g/dL (ref 3.5–5.2)
Alkaline Phosphatase: 104 U/L (ref 39–117)
BILIRUBIN DIRECT: 0.1 mg/dL (ref 0.0–0.3)
Indirect Bilirubin: 0.3 mg/dL (ref 0.2–1.2)
TOTAL PROTEIN: 7 g/dL (ref 6.0–8.3)
Total Bilirubin: 0.4 mg/dL (ref 0.2–1.2)

## 2014-12-27 LAB — BASIC METABOLIC PANEL WITH GFR
BUN: 10 mg/dL (ref 6–23)
CALCIUM: 9.9 mg/dL (ref 8.4–10.5)
CHLORIDE: 107 meq/L (ref 96–112)
CO2: 28 mEq/L (ref 19–32)
Creat: 0.74 mg/dL (ref 0.50–1.10)
GFR, Est African American: >89 mL/min
GFR, Est Non African American: 84 mL/min
Glucose, Bld: 99 mg/dL (ref 70–99)
Potassium: 4.5 mEq/L (ref 3.5–5.3)
Sodium: 142 mEq/L (ref 135–145)

## 2014-12-27 LAB — LIPID PANEL
Cholesterol: 249 mg/dL — ABNORMAL HIGH (ref 0–200)
HDL: 91 mg/dL (ref >=46)
LDL Cholesterol: 146 mg/dL — ABNORMAL HIGH (ref 0–99)
Total CHOL/HDL Ratio: 2.7 Ratio
Triglycerides: 59 mg/dL (ref <150)
VLDL: 12 mg/dL (ref 0–40)

## 2014-12-27 LAB — TSH: TSH: 3.212 u[IU]/mL (ref 0.350–4.500)

## 2014-12-27 NOTE — Patient Instructions (Signed)

## 2014-12-27 NOTE — Progress Notes (Signed)
Patient ID: Laura Maynard, female   DOB: May 14, 1946, 69 y.o.   MRN: [id number]  Annual Comprehensive Examination  This very nice 70 y.o. DBF presents for complete physical.  Patient has been followed for HTN, T2_NIDDM  Prediabetes, Hyperlipidemia, and Vitamin D Deficiency. Patient has been followed by this physician and over the years has repeatedly demonstrated "medicine phobias" compromising her medical management. She has been agreeable to take meds for her migraines and has seen Neurologist in the past.     HTN predates many years and has been monitored expectantly. Patient's BP has been controlled at home and patient denies any cardiac symptoms as chest pain, palpitations, shortness of breath, dizziness or ankle swelling. Today's BP: 102/62 mmHg    Last lipids were Total Chol 274; HDL 130; LDL 132; Trig 60 on 09/14/2014.   Patient has prediabetes predating since Jan 2012 with A1c 5.7% and patient denies reactive hypoglycemic symptoms, visual blurring, diabetic polys, or paresthesias. Last A1c was 5.4% on 09/14/2014.   Patient recently had I131 RAI tx/o hyperthyroid MNG and is on replacement therapy per Dr Cruzita Lederer. Finally, patient has history of Vitamin D Deficiency of less than 10 in 2010 and last Vitamin D was still very low at 19 on 09/14/2014.  Medication Sig  . aspirin 81 MG tablet Take 81 mg by mouth daily.    . butalbital-acetaminophen-caffeine (FIORICET) 50-325-40 MG  TAKE 1-2 TABLETS BY MOUTH EVERY 4 HOURS AS NEEDED, MAX 6 PER DAY  . VITAMIN D  Take 1,000 Units by mouth daily.  Marland Kitchen levothyroxine  50 MCG tablet Take 1 tablet (50 mcg total) by mouth daily.  . meclizine (ANTIVERT) 25 MG tablet TAKE 1 TABLET BY MOUTH THREE TIMES DAILY FOR VERTIGO  . Naproxen Sodium (ALEVE PO) Take 1 tablet by mouth as needed.    . SUMAtriptan Succinate 6 MG/0.5ML SOTJ Inject subcutaneous into skin as needed for migraines  . ciprofloxacin (CIPRO) 500 MG tablet Take 1 tablet (500 mg total) by mouth 2 (two)  times daily. One po bid x 7 days  . traMADol (ULTRAM) 50 MG tablet Take 1 tablet (50 mg total) by mouth every 6 (six) hours as needed.   Allergies  Allergen Reactions  . Penicillins Hives  . Pravastatin     Myalgia  . Sulfa Antibiotics Hives  . Zetia [Ezetimibe]     Myalgia   Past Medical History  Diagnosis Date  . Cataract   . Hyperlipidemia   . Migraine   . Blind right eye     legally  . Sarcoidosis, lung 15 YRS AGO    NO TREATMENTS  . Neuropathy   . Hypertension   . Prediabetes   . Vitamin D deficiency   . Anemia   . Toxic multinodular goiter 05/27/2014   Health Maintenance  Topic Date Due  . DEXA SCAN  01/07/2012  . INFLUENZA VACCINE  04/30/2014  . COLONOSCOPY  03/27/2016  . MAMMOGRAM  05/04/2016  . PNA vac Low Risk Adult (2 of 2 - PPSV23) 10/15/2016  . TETANUS/TDAP  10/15/2021  . ZOSTAVAX  Completed   Immunization History  Administered Date(s) Administered  . Influenza Split 06/30/2013  . Pneumococcal Conjugate-13 09/14/2014  . Pneumococcal Polysaccharide-23 10/16/2011  . Td 10/16/2011  . Zoster 01/14/2007   Past Surgical History  Procedure Laterality Date  . Eye surgery  1964    right eye  . Back surgery  1970  . Nasal septum surgery  1998  . Total abdominal hysterectomy  2000  .  Knee arthroscopy  2002  . Colonoscopy  2006; 03/28/11    2 small adenomas; normal   Family History  Problem Relation Age of Onset  . Lung cancer Sister 13  . Throat cancer Brother 10  . Colon cancer Neg Hx   . COPD Sister   . Diabetes Brother   . Stroke Mother   . Emphysema Father    History  Substance Use Topics  . Smoking status: Never Smoker   . Smokeless tobacco: Never Used  . Alcohol Use: No     Comment: RARE    ROS Constitutional: Denies fever, chills, weight loss/gain, headaches, insomnia, fatigue, night sweats, and change in appetite. Eyes: Denies redness, blurred vision, diplopia, discharge, itchy, watery eyes.  ENT: Denies discharge, congestion, post  nasal drip, epistaxis, sore throat, earache, hearing loss, dental pain, Tinnitus, Vertigo, Sinus pain, snoring.  Cardio: Denies chest pain, palpitations, irregular heartbeat, syncope, dyspnea, diaphoresis, orthopnea, PND, claudication, edema Respiratory: denies cough, dyspnea, DOE, pleurisy, hoarseness, laryngitis, wheezing.  Gastrointestinal: Denies dysphagia, heartburn, reflux, water brash, pain, cramps, nausea, vomiting, bloating, diarrhea, constipation, hematemesis, melena, hematochezia, jaundice, hemorrhoids Genitourinary: Denies dysuria, frequency, urgency, nocturia, hesitancy, discharge, hematuria, flank pain Breast: Breast lumps, nipple discharge, bleeding.  Musculoskeletal: Denies arthralgia, myalgia, stiffness, Jt. Swelling, pain, limp, and strain/sprain. Denies falls. Skin: Denies puritis, rash, hives, warts, acne, eczema, changing in skin lesion Neuro: No weakness, tremor, incoordination, spasms, paresthesia, pain Psychiatric: Denies confusion, memory loss, sensory loss. Denies Depression. Endocrine: Denies change in weight, skin, hair change, nocturia, and paresthesia, diabetic polys, visual blurring, hyper / hypo glycemic episodes.  Heme/Lymph: No excessive bleeding, bruising, enlarged lymph nodes.  Physical Exam  BP 102/62   Pulse 84  Temp 97.5 F   Resp 16  Ht 5' 6.25"   Wt 154 lb 3.2 oz     BMI 24.69  General Appearance: Well nourished and in no apparent distress. Eyes: PERRLA, EOMs, conjunctiva no swelling or erythema, normal fundi and vessels. Sinuses: No frontal/maxillary tenderness ENT/Mouth: EACs patent / TMs  nl. Nares clear without erythema, swelling, mucoid exudates. Oral hygiene is good. No erythema, swelling, or exudate. Tongue normal, non-obstructing. Tonsils not swollen or erythematous. Hearing normal.  Neck: Supple, thyroid normal. No bruits, nodes or JVD. Respiratory: Respiratory effort normal.  BS equal and clear bilateral without rales, rhonci, wheezing or  stridor. Cardio: Heart sounds are normal with regular rate and rhythm and no murmurs, rubs or gallops. Peripheral pulses are normal and equal bilaterally without edema. No aortic or femoral bruits. Chest: symmetric with normal excursions and percussion. Breasts: Symmetric, without lumps, nipple discharge, retractions, or fibrocystic changes.  Abdomen: Flat, soft, with bowl sounds. Nontender, no guarding, rebound, hernias, masses, or organomegaly.  Lymphatics: Non tender without lymphadenopathy.   Musculoskeletal: Full ROM all peripheral extremities, joint stability, 5/5 strength, and normal gait. Skin: Warm and dry without rashes, lesions, cyanosis, clubbing or  ecchymosis.  Neuro: Cranial nerves intact, reflexes equal bilaterally. Normal muscle tone, no cerebellar symptoms. Sensation intact.  Pysch: Awake and oriented X 3, normal affect, Insight and Judgment appropriate.   Assessment and Plan  1. Essential hypertension  - Microalbumin / creatinine urine ratio - EKG 12-Lead - POC Hemoccult Bld/Stl (3-Cd Home Screen); Future  2. Hyperlipidemia  - Lipid panel  3. Prediabetes  - Hemoglobin A1c - Insulin, random  4. Vitamin D deficiency  - Vit D  25 hydroxy (rtn osteoporosis monitoring)  5. Hypothyroidism   - TSH  6. Screening for rectal cancer  7. Depression screen   8. At low risk for fall   9. Medication management  - Urine Microscopic - CBC with Differential/Platelet - BASIC METABOLIC PANEL WITH GFR - Hepatic function panel - Magnesium   Continue prudent diet as discussed, weight control, BP monitoring, regular exercise, and medications. Discussed med's effects and SE's. Screening labs and tests as requested with regular follow-up as recommended. Over 40 minutes of exam, counseling, chart review was performed.

## 2014-12-28 LAB — MICROALBUMIN / CREATININE URINE RATIO
CREATININE, URINE: 220.6 mg/dL
Microalb Creat Ratio: 6.8 mg/g (ref 0.0–30.0)
Microalb, Ur: 1.5 mg/dL (ref <2.0)

## 2014-12-28 LAB — VITAMIN D 25 HYDROXY (VIT D DEFICIENCY, FRACTURES): Vit D, 25-Hydroxy: 20 ng/mL — ABNORMAL LOW (ref 30–100)

## 2014-12-28 LAB — URINALYSIS, MICROSCOPIC ONLY
BACTERIA UA: NONE SEEN
CRYSTALS: NONE SEEN
Casts: NONE SEEN
Squamous Epithelial / LPF: NONE SEEN

## 2014-12-28 LAB — HEMOGLOBIN A1C
Hgb A1c MFr Bld: 5.7 % — ABNORMAL HIGH (ref <5.7)
Mean Plasma Glucose: 117 mg/dL — ABNORMAL HIGH (ref <117)

## 2014-12-28 LAB — INSULIN, RANDOM: Insulin: 6 u[IU]/mL (ref 2.0–19.6)

## 2015-01-11 ENCOUNTER — Other Ambulatory Visit (INDEPENDENT_AMBULATORY_CARE_PROVIDER_SITE_OTHER): Payer: Medicare Other

## 2015-01-11 DIAGNOSIS — I10 Essential (primary) hypertension: Secondary | ICD-10-CM

## 2015-01-11 LAB — POC HEMOCCULT BLD/STL (HOME/3-CARD/SCREEN)
Card #2 Fecal Occult Blod, POC: NEGATIVE
Card #3 Fecal Occult Blood, POC: NEGATIVE
Fecal Occult Blood, POC: NEGATIVE

## 2015-01-22 ENCOUNTER — Other Ambulatory Visit: Payer: Self-pay | Admitting: Physician Assistant

## 2015-02-17 ENCOUNTER — Ambulatory Visit (INDEPENDENT_AMBULATORY_CARE_PROVIDER_SITE_OTHER): Payer: Medicare Other | Admitting: Internal Medicine

## 2015-02-17 ENCOUNTER — Encounter: Payer: Self-pay | Admitting: Internal Medicine

## 2015-02-17 VITALS — BP 102/58 | HR 70 | Temp 98.2°F | Resp 16 | Ht 66.25 in | Wt 157.0 lb

## 2015-02-17 DIAGNOSIS — H9203 Otalgia, bilateral: Secondary | ICD-10-CM

## 2015-02-17 MED ORDER — MOMETASONE FUROATE 50 MCG/ACT NA SUSP: 2.0000 | Freq: Every day | NASAL | Status: DC

## 2015-02-17 MED ORDER — PREDNISONE 20 MG PO TABS: ORAL_TABLET | ORAL | Status: DC

## 2015-02-17 NOTE — Progress Notes (Signed)
Subjective:    Patient ID: Laura Maynard, female    DOB: 08-12-1946, 69 y.o.   MRN: [id number]  Otalgia  Associated symptoms include headaches. Pertinent negatives include no coughing, ear discharge, rhinorrhea or sore throat.  Headache  Associated symptoms include ear pain. Pertinent negatives include no coughing, fever, rhinorrhea or sore throat.   Patient presents to the office for evaluation of headache and bilateral ear pain x 1 week.  Her ears have been intermittently bothering her and the pain is aching and painful.  She reports that she does have some mild runny nose this morning.  The headache that she is having is different from her typical migraines.  Her pain is frontal in nature and over her sinuses.  She reports that her pain in her head and ears are worse with recumbancy and is better with standing.  She can feel the pressure in her ear that is worse when she hits bumps in the car.  No other sick contacts that she is aware of.  She reports that she has tried aleve D and her headache medication with no relief.    Review of Systems  Constitutional: Negative for fever, chills and fatigue.  HENT: Positive for congestion and ear pain. Negative for ear discharge, nosebleeds, postnasal drip, rhinorrhea and sore throat.   Respiratory: Negative for cough, chest tightness, shortness of breath and wheezing.   Cardiovascular: Negative for chest pain and palpitations.  Neurological: Positive for headaches.       Objective:   Physical Exam  Constitutional: She is oriented to person, place, and time. She appears well-developed and well-nourished.  HENT:  Head: Normocephalic and atraumatic.  Right Ear: Hearing, external ear and ear canal normal. A middle ear effusion is present.  Left Ear: Hearing, external ear and ear canal normal. A middle ear effusion is present.  Nose: Mucosal edema present.  Mouth/Throat: Uvula is midline, oropharynx is clear and moist and mucous membranes are  normal. No trismus in the jaw. No oropharyngeal exudate.  Eyes: Conjunctivae are normal. Right eye exhibits no discharge. Left eye exhibits no discharge. No scleral icterus.  Neck: Normal range of motion. Neck supple. No JVD present. No thyromegaly present.  Cardiovascular: Normal rate, regular rhythm, normal heart sounds and intact distal pulses.  Exam reveals no gallop and no friction rub.   No murmur heard. Pulmonary/Chest: Effort normal and breath sounds normal. No respiratory distress. She has no wheezes. She has no rales. She exhibits no tenderness.  Abdominal: Soft. Bowel sounds are normal. She exhibits no distension and no mass. There is no tenderness. There is no rebound and no guarding.  Musculoskeletal: Normal range of motion.  Lymphadenopathy:    She has no cervical adenopathy.  Neurological: She is alert and oriented to person, place, and time. She has normal strength. No cranial nerve deficit or sensory deficit. Coordination normal.  Skin: Skin is warm and dry.  Psychiatric: She has a normal mood and affect. Her behavior is normal. Judgment and thought content normal.  Nursing note and vitals reviewed.         Assessment & Plan:    1. Otalgia of both ears -headache likely due to sinus pressure.  Allergic rhintis vs. Viral infection.   -zyrtec -nasal saline -benadryl at night time prn -can use 2-3 days of afrin if desired, but no more than this. - predniSONE (DELTASONE) 20 MG tablet; 3 tabs po day one, then 2 tabs daily x 4 days  Dispense: 11  tablet; Refill: 0 - mometasone (NASONEX) 50 MCG/ACT nasal spray; Place 2 sprays into the nose daily.  Dispense: 17 g; Refill: 2  -return in 1 week if no better

## 2015-02-17 NOTE — Patient Instructions (Signed)
Please take prednisone as prescribed until it is gone  Please take a daily cetirizine at night time until your congestion and ear pain is gone  You may use saline in your nose as often as you can tolerate  Please use nasonex in your nose two sprays per nostril prior to bedtime.  DO NOT blow you nose after applying.    If no better in 1 week please let me know and we will try an antibiotic.

## 2015-02-20 ENCOUNTER — Other Ambulatory Visit: Payer: Self-pay | Admitting: Internal Medicine

## 2015-02-23 ENCOUNTER — Telehealth: Payer: Self-pay | Admitting: Internal Medicine

## 2015-02-23 MED ORDER — LEVOTHYROXINE SODIUM 50 MCG PO TABS: 50.0000 ug | ORAL_TABLET | Freq: Every day | ORAL | Status: DC

## 2015-02-23 NOTE — Telephone Encounter (Signed)
Pt needs refill on levothyroxine please she is out today call into [facility] on [address]

## 2015-02-23 NOTE — Telephone Encounter (Signed)
Done

## 2015-03-09 ENCOUNTER — Ambulatory Visit: Payer: Medicare Other | Admitting: Internal Medicine

## 2015-03-15 DIAGNOSIS — H31011 Macula scars of posterior pole (postinflammatory) (post-traumatic), right eye: Secondary | ICD-10-CM | POA: Diagnosis not present

## 2015-04-07 ENCOUNTER — Ambulatory Visit: Payer: Medicare Other | Admitting: Internal Medicine

## 2015-04-12 ENCOUNTER — Ambulatory Visit (INDEPENDENT_AMBULATORY_CARE_PROVIDER_SITE_OTHER): Payer: Medicare Other | Admitting: Internal Medicine

## 2015-04-12 ENCOUNTER — Encounter: Payer: Self-pay | Admitting: Internal Medicine

## 2015-04-12 VITALS — BP 114/70 | HR 77 | Temp 98.3°F | Resp 12 | Wt 158.0 lb

## 2015-04-12 DIAGNOSIS — E0520 Thyrotoxicosis with toxic multinodular goiter without thyrotoxic crisis or storm: Secondary | ICD-10-CM | POA: Diagnosis not present

## 2015-04-12 LAB — TSH: TSH: 3.81 u[IU]/mL (ref 0.35–4.50)

## 2015-04-12 LAB — T4, FREE: FREE T4: 0.9 ng/dL (ref 0.60–1.60)

## 2015-04-12 NOTE — Progress Notes (Signed)
Patient ID: Laura Maynard, female   DOB: Sep 25, 1946, 69 y.o.   MRN: [id number]   HPI  Laura Maynard is a 69 y.o.-year-old female, returning for f/u for postablative hypothyroidism post toxic multinodular goiter (TMNG) RAI tx. Last visit 5 mo ago.  Reviewed hx:  Pt had screening labs in 01/2014 and was found to have a low TSH. This was repeated 2x and the TSH continued to decrease.   Thyroid Uptake and scan (05/20/2014) >> uptake 48.5% and scan c/w TMNG.  We started MMI 5 mg bid.  Had RAI tx on 06/09/2014.   After RAI, she developed hypothyroidism >> started LT4 50 mcg daily >> TFTs normal x 2 after this.  She takes the LT4: - forgets it once a week!!! - am - fasting - with 1 glass of water - eats 1h later - no Ca, MVI, Fe, PPI  I reviewed pt's thyroid tests: Lab Results  Component Value Date   TSH 3.212 12/27/2014   TSH 3.75 12/14/2014   TSH 25.24* 11/08/2014   TSH 0.55 09/08/2014   TSH 0.19* 08/08/2014   TSH 0.00* 07/08/2014   TSH 0.03* 05/27/2014   TSH 0.009* 05/02/2014   TSH 0.042* 03/30/2014   TSH 0.079* 02/08/2014   FREET4 0.69 12/14/2014   FREET4 0.38* 11/08/2014   FREET4 0.71 09/08/2014   FREET4 0.88 08/08/2014   FREET4 1.79* 07/08/2014   FREET4 1.23 05/27/2014    Pt denies feeling nodules in neck, hoarseness, dysphagia/odynophagia, SOB with lying down; she c/o: - no insomnia - no weight loss - no heat intolerance - no tremors - no anxiety - no fatigue - + hair loss (thinning) - no dry skin - no hyperdefecation, + constipation   She also has a history of HTN, HL, preDM, vit D def., anemia.  ROS: Constitutional: see HPI Eyes: no blurry vision, no xerophthalmia ENT: no sore throat, no nodules palpated in throat, no dysphagia/odynophagia, no hoarseness Cardiovascular: no CP/SOB/palpitations/leg swelling Respiratory: occasional cough/no SOB Gastrointestinal: no N/V/D/C/heartburn Musculoskeletal: no muscle aches/no joint aches Skin: no rashes,  +  hair loss and dry skin Neurological: no tremors/numbness/tingling/dizziness  I reviewed pt's medications, allergies, PMH, social hx, family hx, and changes were documented in the history of present illness. Otherwise, unchanged from my initial visit note.  PE: BP 114/70 mmHg  Pulse 77  Temp(Src) 98.3 F (36.8 C) (Oral)  Resp 12  Wt 158 lb (71.668 kg)  SpO2 97% Body mass index is 25.3 kg/(m^2). Wt Readings from Last 3 Encounters:  04/12/15 158 lb (71.668 kg)  02/17/15 157 lb (71.215 kg)  12/27/14 154 lb 3.2 oz (69.945 kg)   Constitutional: normal weight, in NAD, but started to cry during the appt: her sister just died Eyes: surgical pupil R eye, EOMI ENT: moist mucous membranes, no thyromegaly, no cervical lymphadenopathy Cardiovascular: RRR, no MRG, no LE edema Respiratory: CTA B Gastrointestinal: abdomen soft, NT, ND, BS+ Musculoskeletal: no deformities, strength intact in all 4 Skin: moist, warm, no rashes Neurological: no tremor with outstretched hands, DTR normal in all 4  ASSESSMENT: 1. Hypothyroidism after RAI tx for Toxic MNG  PLAN:  1. Patient with h/o thyrotoxicosis 2/2 TMNG, previously with some thyrotoxic sxs: weight loss, anxiety, insomnia; now all resolved after the RAI tx. She developped hypothyroidism after RAI tx >> on LT4 50 mcg daily (forgets it once a week in am >> advised to take it that day, when she remembers) -  we discussed how to take the LT4 correctly.  She will need to take the thyroid hormone every day, with water, >30 minutes before breakfast, separated by >4 hours from acid reflux medications, calcium, iron, multivitamins. - will check the TSH, fT4 today - explained that if we need to change the dose >> will need a recheck TFTs in 6 weeks - RTC in 6 months  Office Visit on 04/12/2015  Component Date Value Ref Range Status  . TSH 04/12/2015 3.81  0.35 - 4.50 uIU/mL Final  . Free T4 04/12/2015 0.90  0.60 - 1.60 ng/dL Final   Thyroid tests normal,  but I would like to repeat them in 6 weeks to see if we need to increase the dose of levothyroxine. I will not change the dose quite now, since she is forgetting the medicine about once a week. We discussed about the importance of taking it every day

## 2015-04-12 NOTE — Patient Instructions (Signed)
Please stop at the lab.  Please come back for a follow-up appointment in 6 months.  Take the thyroid hormone every day, with water, >30 minutes before breakfast, separated by >4 hours from acid reflux medications, calcium, iron, multivitamins.  Hypothyroidism The thyroid is a large gland located in the lower front of your neck. The thyroid gland helps control metabolism. Metabolism is how your body handles food. It controls metabolism with the hormone thyroxine. When this gland is underactive (hypothyroid), it produces too little hormone.  CAUSES These include:   Absence or destruction of thyroid tissue.  Goiter due to iodine deficiency.  Goiter due to medications.  Congenital defects (since birth).  Problems with the pituitary. This causes a lack of TSH (thyroid stimulating hormone). This hormone tells the thyroid to turn out more hormone. SYMPTOMS  Lethargy (feeling as though you have no energy)  Cold intolerance  Weight gain (in spite of normal food intake)  Dry skin  Coarse hair  Menstrual irregularity (if severe, may lead to infertility)  Slowing of thought processes Cardiac problems are also caused by insufficient amounts of thyroid hormone. Hypothyroidism in the newborn is cretinism, and is an extreme form. It is important that this form be treated adequately and immediately or it will lead rapidly to retarded physical and mental development. DIAGNOSIS  To prove hypothyroidism, your caregiver may do blood tests and ultrasound tests. Sometimes the signs are hidden. It may be necessary for your caregiver to watch this illness with blood tests either before or after diagnosis and treatment. TREATMENT  Low levels of thyroid hormone are increased by using synthetic thyroid hormone. This is a safe, effective treatment. It usually takes about four weeks to gain the full effects of the medication. After you have the full effect of the medication, it will generally take another  four weeks for problems to leave. Your caregiver may start you on low doses. If you have had heart problems the dose may be gradually increased. It is generally not an emergency to get rapidly to normal. HOME CARE INSTRUCTIONS   Take your medications as your caregiver suggests. Let your caregiver know of any medications you are taking or start taking. Your caregiver will help you with dosage schedules.  As your condition improves, your dosage needs may increase. It will be necessary to have continuing blood tests as suggested by your caregiver.  Report all suspected medication side effects to your caregiver. SEEK MEDICAL CARE IF: Seek medical care if you develop:  Sweating.  Tremulousness (tremors).  Anxiety.  Rapid weight loss.  Heat intolerance.  Emotional swings.  Diarrhea.  Weakness. SEEK IMMEDIATE MEDICAL CARE IF:  You develop chest pain, an irregular heart beat (palpitations), or a rapid heart beat. MAKE SURE YOU:   Understand these instructions.  Will watch your condition.  Will get help right away if you are not doing well or get worse. Document Released: 09/16/2005 Document Revised: 12/09/2011 Document Reviewed: 05/06/2008 [facility] Patient Information 2015 [place], [place]. This information is not intended to replace advice given to you by your health care provider. Make sure you discuss any questions you have with your health care provider.

## 2015-04-19 ENCOUNTER — Encounter: Payer: Self-pay | Admitting: Internal Medicine

## 2015-04-19 ENCOUNTER — Ambulatory Visit (INDEPENDENT_AMBULATORY_CARE_PROVIDER_SITE_OTHER): Payer: Medicare Other | Admitting: Internal Medicine

## 2015-04-19 VITALS — BP 124/78 | HR 80 | Temp 98.2°F | Resp 18 | Ht 66.25 in | Wt 152.0 lb

## 2015-04-19 DIAGNOSIS — E782 Mixed hyperlipidemia: Secondary | ICD-10-CM | POA: Diagnosis not present

## 2015-04-19 DIAGNOSIS — Z79899 Other long term (current) drug therapy: Secondary | ICD-10-CM | POA: Diagnosis not present

## 2015-04-19 DIAGNOSIS — I10 Essential (primary) hypertension: Secondary | ICD-10-CM | POA: Diagnosis not present

## 2015-04-19 DIAGNOSIS — F4321 Adjustment disorder with depressed mood: Secondary | ICD-10-CM

## 2015-04-19 DIAGNOSIS — R7303 Prediabetes: Secondary | ICD-10-CM

## 2015-04-19 DIAGNOSIS — E663 Overweight: Secondary | ICD-10-CM | POA: Diagnosis not present

## 2015-04-19 DIAGNOSIS — E559 Vitamin D deficiency, unspecified: Secondary | ICD-10-CM

## 2015-04-19 DIAGNOSIS — R7309 Other abnormal glucose: Secondary | ICD-10-CM

## 2015-04-19 DIAGNOSIS — H9203 Otalgia, bilateral: Secondary | ICD-10-CM

## 2015-04-19 LAB — CBC WITH DIFFERENTIAL/PLATELET
Basophils Absolute: 0.1 10*3/uL (ref 0.0–0.1)
Basophils Relative: 1 % (ref 0–1)
EOS ABS: 0.1 10*3/uL (ref 0.0–0.7)
EOS PCT: 1 % (ref 0–5)
HCT: 36.9 % (ref 36.0–46.0)
Hemoglobin: 12.3 g/dL (ref 12.0–15.0)
Lymphocytes Relative: 38 % (ref 12–46)
Lymphs Abs: 1.9 10*3/uL (ref 0.7–4.0)
MCH: 27.6 pg (ref 26.0–34.0)
MCHC: 33.3 g/dL (ref 30.0–36.0)
MCV: 82.7 fL (ref 78.0–100.0)
MONO ABS: 0.4 10*3/uL (ref 0.1–1.0)
MPV: 9 fL (ref 8.6–12.4)
Monocytes Relative: 8 % (ref 3–12)
NEUTROS ABS: 2.6 10*3/uL (ref 1.7–7.7)
NEUTROS PCT: 52 % (ref 43–77)
Platelets: 348 10*3/uL (ref 150–400)
RBC: 4.46 MIL/uL (ref 3.87–5.11)
RDW: 14 % (ref 11.5–15.5)
WBC: 5 10*3/uL (ref 4.0–10.5)

## 2015-04-19 LAB — HEPATIC FUNCTION PANEL
ALT: 12 U/L (ref 0–35)
AST: 18 U/L (ref 0–37)
Albumin: 4.5 g/dL (ref 3.5–5.2)
Alkaline Phosphatase: 99 U/L (ref 39–117)
BILIRUBIN DIRECT: 0.1 mg/dL (ref 0.0–0.3)
Indirect Bilirubin: 0.4 mg/dL (ref 0.2–1.2)
Total Bilirubin: 0.5 mg/dL (ref 0.2–1.2)
Total Protein: 7 g/dL (ref 6.0–8.3)

## 2015-04-19 LAB — BASIC METABOLIC PANEL WITH GFR
BUN: 11 mg/dL (ref 6–23)
CALCIUM: 9.6 mg/dL (ref 8.4–10.5)
CHLORIDE: 105 meq/L (ref 96–112)
CO2: 26 mEq/L (ref 19–32)
Creat: 0.75 mg/dL (ref 0.50–1.10)
GFR, Est African American: >89 mL/min
GFR, Est Non African American: 82 mL/min
Glucose, Bld: 88 mg/dL (ref 70–99)
POTASSIUM: 4.5 meq/L (ref 3.5–5.3)
Sodium: 141 mEq/L (ref 135–145)

## 2015-04-19 LAB — LIPID PANEL
Cholesterol: 291 mg/dL — ABNORMAL HIGH (ref 0–200)
HDL: 134 mg/dL (ref >=46)
LDL Cholesterol: 145 mg/dL — ABNORMAL HIGH (ref 0–99)
Total CHOL/HDL Ratio: 2.2 Ratio
Triglycerides: 59 mg/dL (ref <150)
VLDL: 12 mg/dL (ref 0–40)

## 2015-04-19 LAB — HEMOGLOBIN A1C
Hgb A1c MFr Bld: 5.7 % — ABNORMAL HIGH (ref <5.7)
Mean Plasma Glucose: 117 mg/dL — ABNORMAL HIGH (ref <117)

## 2015-04-19 LAB — MAGNESIUM: MAGNESIUM: 2.3 mg/dL (ref 1.5–2.5)

## 2015-04-19 MED ORDER — LORATADINE 10 MG PO TABS: 10.0000 mg | ORAL_TABLET | Freq: Every day | ORAL | Status: DC

## 2015-04-19 MED ORDER — PREDNISONE 20 MG PO TABS: ORAL_TABLET | ORAL | Status: DC

## 2015-04-19 MED ORDER — BUTALBITAL-APAP-CAFFEINE 50-325-40 MG PO TABS: ORAL_TABLET | ORAL | Status: DC

## 2015-04-19 NOTE — Patient Instructions (Signed)
[facility] Address: [address] [place], [place] [address]  Phone: [phone]   Grief Reaction Grief is a normal response to the death of someone close to you. Feelings of fear, anger, and guilt can affect almost everyone who loses someone they love. Symptoms of depression are also common. These include problems with sleep, loss of appetite, and lack of energy. These grief reaction symptoms often last for weeks to months after a loss. They may also return during special times that remind you of the person you lost, such as an anniversary or birthday. Anxiety, insomnia, irritability, and deep depression may last beyond the period of normal grief. If you experience these feelings for 6 months or longer, you may have clinical depression. Clinical depression requires further medical attention. If you think that you have clinical depression, you should contact your caregiver. If you have a history of depression or a family history of depression, you are at greater risk of clinical depression. You are also at greater risk of developing clinical depression if the loss was traumatic or the loss was of someone with whom you had unresolved issues.  A grief reaction can become complicated by being blocked. This means being unable to cry or express extreme emotions. This may prolong the grieving period and worsen the emotional effects of the loss. Mourning is a natural event in human life. A healthy grief reaction is one that is not blocked. It requires a time of sadness and readjustment. It is very important to share your sorrow and fear with others, especially close friends and family. Professional counselors and clergy can also help you process your grief. Document Released: 09/16/2005 Document Revised: 01/31/2014 Document Reviewed: 05/27/2006 [facility] Patient Information 2015 [place], [place]. This information is not intended to replace advice given to you by your health care provider. Make sure you  discuss any questions you have with your health care provider.

## 2015-04-19 NOTE — Progress Notes (Signed)
Assessment and Plan:  Hypertension:  -Continue medication,  -monitor blood pressure at home.  -Continue DASH diet.   -Reminder to go to the ER if any CP, SOB, nausea, dizziness, severe HA, changes vision/speech, left arm numbness and tingling, and jaw pain.  Cholesterol: -Continue diet and exercise.  -Check cholesterol.   Pre-diabetes: -Continue diet and exercise.  -Check A1C  Vitamin D Def: -check level -continue medications.   Grief -encouraged counseling and gave hospice number -encouraged exercise -patient given medication options to help with  Grief but she currently declines  Allergic rhinitis -restart claritin and nasonex -prednisone course  Continue diet and meds as discussed. Further disposition pending results of labs.  HPI 69 y.o. female  presents for 3 month follow up with hypertension, hyperlipidemia, prediabetes and vitamin D.   Her blood pressure has been controlled at home, today their BP is BP: 124/78 mmHg.   She does not workout. She denies chest pain, shortness of breath, dizziness.   She is not on cholesterol medication and denies myalgias. Her cholesterol is not at goal. The cholesterol last visit was:   Lab Results  Component Value Date   CHOL 249* 12/27/2014   HDL 91 12/27/2014   LDLCALC 146* 12/27/2014   TRIG 59 12/27/2014   CHOLHDL 2.7 12/27/2014     She has been working on diet and exercise for prediabetes, and denies foot ulcerations, hyperglycemia, hypoglycemia , increased appetite, nausea, paresthesia of the feet, polydipsia, polyuria, visual disturbances, vomiting and weight loss. Last A1C in the office was:  Lab Results  Component Value Date   HGBA1C 5.7* 12/27/2014    Patient is on Vitamin D supplement.  Lab Results  Component Value Date   VD25OH 20* 12/27/2014     Patient reports that she is having some issues with nasal congestion, drippy noses, and post nasal drip.  She reports that she has stopped taking her allergy  medication and she is surprised that her symptoms have come back.    Patient reports that she has been eating very poorly and she has been struggling to get up and do things since   Current Medications:  Current Outpatient Prescriptions on File Prior to Visit  Medication Sig Dispense Refill  . aspirin 81 MG tablet Take 81 mg by mouth daily.      . butalbital-acetaminophen-caffeine (FIORICET, ESGIC) 50-325-40 MG per tablet TAKE 1-2 TABLETS BY MOUTH EVERY 4 HOURS AS NEEDED, MAX 6 PER DAY 50 tablet 0  . cholecalciferol (VITAMIN D) 1000 UNITS tablet Take 1,000 Units by mouth daily.    Marland Kitchen levothyroxine (SYNTHROID, LEVOTHROID) 50 MCG tablet Take 1 tablet (50 mcg total) by mouth daily. 45 tablet 2  . meclizine (ANTIVERT) 25 MG tablet TAKE 1 TABLET BY MOUTH THREE TIMES DAILY FOR VERTIGO 100 tablet 0  . mometasone (NASONEX) 50 MCG/ACT nasal spray Place 2 sprays into the nose daily. 17 g 2  . Naproxen Sodium (ALEVE PO) Take 1 tablet by mouth as needed.      . SUMAtriptan Succinate 6 MG/0.5ML SOTJ Inject subcutaneous into skin as needed for migraines 6 Device 0   No current facility-administered medications on file prior to visit.    Medical History:  Past Medical History  Diagnosis Date  . Cataract   . Hyperlipidemia   . Migraine   . Blind right eye     legally  . Sarcoidosis, lung 15 YRS AGO    NO TREATMENTS  . Neuropathy   . Hypertension   .  Prediabetes   . Vitamin D deficiency   . Anemia   . Toxic multinodular goiter 05/27/2014    Allergies:  Allergies  Allergen Reactions  . Penicillins Hives  . Pravastatin     Myalgia  . Sulfa Antibiotics Hives  . Zetia [Ezetimibe]     Myalgia     Review of Systems:  Review of Systems  Constitutional: Positive for malaise/fatigue. Negative for fever and chills.  HENT: Positive for congestion, ear pain and sore throat.   Eyes: Negative.   Respiratory: Negative for cough, shortness of breath and wheezing.   Cardiovascular: Negative for  chest pain, palpitations and leg swelling.  Gastrointestinal: Negative.  Negative for diarrhea, constipation, blood in stool and melena.  Genitourinary: Negative.   Skin: Negative for itching and rash.  Neurological: Negative for dizziness, sensory change and headaches.  Psychiatric/Behavioral: Positive for depression. Negative for suicidal ideas. The patient is not nervous/anxious and does not have insomnia.     Family history- Review and unchanged  Social history- Review and unchanged  Physical Exam: BP 124/78 mmHg  Pulse 80  Temp(Src) 98.2 F (36.8 C) (Temporal)  Resp 18  Ht 5' 6.25" (1.683 m)  Wt 152 lb (68.947 kg)  BMI 24.34 kg/m2 Wt Readings from Last 3 Encounters:  04/19/15 152 lb (68.947 kg)  04/12/15 158 lb (71.668 kg)  02/17/15 157 lb (71.215 kg)    General Appearance: Well nourished well developed, in no apparent distress. Eyes: PERRLA, EOMs, conjunctiva no swelling or erythema ENT/Mouth: Ear canals normal without obstruction, swelling, erythma, discharge.  TMs normal bilaterally with some effusions in middle ear.  Oropharynx moist, clear, without exudate, or postoropharyngeal swelling.  Nasal mucosal edema. Neck: Supple, thyroid normal,no cervical adenopathy  Respiratory: Respiratory effort normal, Breath sounds clear A&P without rhonchi, wheeze, or rale.  No retractions, no accessory usage. Cardio: RRR with no MRGs. Brisk peripheral pulses without edema.  Abdomen: Soft, + BS,  Non tender, no guarding, rebound, hernias, masses. Musculoskeletal: Full ROM, 5/5 strength, Normal gait Skin: Warm, dry without rashes, lesions, ecchymosis.  Neuro: Awake and oriented X 3, Cranial nerves intact. Normal muscle tone, no cerebellar symptoms. Psych: Teary and depressed affect, Insight and Judgment appropriate.    Starlyn Skeans, PA-C 9:03 AM [facility]

## 2015-04-20 LAB — VITAMIN D 25 HYDROXY (VIT D DEFICIENCY, FRACTURES): Vit D, 25-Hydroxy: 15 ng/mL — ABNORMAL LOW (ref 30–100)

## 2015-04-20 LAB — INSULIN, RANDOM: INSULIN: 4.6 u[IU]/mL (ref 2.0–19.6)

## 2015-05-22 DIAGNOSIS — Z1231 Encounter for screening mammogram for malignant neoplasm of breast: Secondary | ICD-10-CM | POA: Diagnosis not present

## 2015-05-31 ENCOUNTER — Other Ambulatory Visit (INDEPENDENT_AMBULATORY_CARE_PROVIDER_SITE_OTHER): Payer: Medicare Other

## 2015-05-31 DIAGNOSIS — E0520 Thyrotoxicosis with toxic multinodular goiter without thyrotoxic crisis or storm: Secondary | ICD-10-CM

## 2015-05-31 LAB — TSH: TSH: 0.18 u[IU]/mL — ABNORMAL LOW (ref 0.35–4.50)

## 2015-05-31 LAB — T4, FREE: FREE T4: 1.01 ng/dL (ref 0.60–1.60)

## 2015-06-26 ENCOUNTER — Ambulatory Visit (INDEPENDENT_AMBULATORY_CARE_PROVIDER_SITE_OTHER): Payer: Medicare Other

## 2015-06-26 DIAGNOSIS — Z130 Encounter for screening for diseases of the blood and blood-forming organs and certain disorders involving the immune mechanism: Secondary | ICD-10-CM | POA: Diagnosis not present

## 2015-06-26 DIAGNOSIS — Z23 Encounter for immunization: Secondary | ICD-10-CM

## 2015-06-26 DIAGNOSIS — R319 Hematuria, unspecified: Secondary | ICD-10-CM | POA: Diagnosis not present

## 2015-06-26 DIAGNOSIS — Z01419 Encounter for gynecological examination (general) (routine) without abnormal findings: Secondary | ICD-10-CM | POA: Diagnosis not present

## 2015-07-06 ENCOUNTER — Ambulatory Visit (INDEPENDENT_AMBULATORY_CARE_PROVIDER_SITE_OTHER): Payer: Medicare Other | Admitting: Internal Medicine

## 2015-07-06 VITALS — BP 114/70 | HR 87 | Temp 98.5°F | Resp 12 | Wt 158.0 lb

## 2015-07-06 DIAGNOSIS — E890 Postprocedural hypothyroidism: Secondary | ICD-10-CM | POA: Insufficient documentation

## 2015-07-06 DIAGNOSIS — Z8639 Personal history of other endocrine, nutritional and metabolic disease: Secondary | ICD-10-CM | POA: Diagnosis not present

## 2015-07-06 LAB — TSH: TSH: 0.15 u[IU]/mL — ABNORMAL LOW (ref 0.35–4.50)

## 2015-07-06 LAB — T4, FREE: FREE T4: 1.05 ng/dL (ref 0.60–1.60)

## 2015-07-06 NOTE — Patient Instructions (Addendum)
Please stop at the lab.  Please continue Levothyroxine 50 mcg daily 6/7 days.  Please come back for a follow-up appointment in 6 months.  Please start CoQ10. Start Vitamin D.

## 2015-07-06 NOTE — Progress Notes (Signed)
Patient ID: Laura Maynard, female   DOB: 1946-05-23, 70 y.o.   MRN: [id number]   HPI  Laura Maynard is a 69 y.o.-year-old female, returning for f/u for postablative hypothyroidism post toxic multinodular goiter (TMNG) RAI tx. Last visit 3 mo ago.  Reviewed hx: Pt had screening labs in 01/2014 and was found to have a low TSH. This was repeated 2x and the TSH continued to decrease.   Thyroid Uptake and scan (05/20/2014) >> uptake 48.5% and scan c/w TMNG.  We started MMI 5 mg bid.  Had RAI tx on 06/09/2014.   After RAI, she developed hypothyroidism >> started LT4 50 mcg daily >> TFTs normal x 2 after this, but last TSH suppressed. >> advised her to skip LT4 on Sundays.  She takes the LT4: - 6/7 days - am - fasting - with 1 glass of water - eats 1h later - no Ca, MVI, Fe, PPI  I reviewed pt's thyroid tests: Lab Results  Component Value Date   TSH 0.18* 05/31/2015   TSH 3.81 04/12/2015   TSH 3.212 12/27/2014   TSH 3.75 12/14/2014   TSH 25.24* 11/08/2014   TSH 0.55 09/08/2014   TSH 0.19* 08/08/2014   TSH 0.00* 07/08/2014   TSH 0.03* 05/27/2014   TSH 0.009* 05/02/2014   FREET4 1.01 05/31/2015   FREET4 0.90 04/12/2015   FREET4 0.69 12/14/2014   FREET4 0.38* 11/08/2014   FREET4 0.71 09/08/2014   FREET4 0.88 08/08/2014   FREET4 1.79* 07/08/2014   FREET4 1.23 05/27/2014    Pt denies feeling nodules in neck, hoarseness, dysphagia/odynophagia, SOB with lying down; she c/o: - + joint pain + mm pain  - generalized, fleeting - similar to when she was on statin. - + fatigue - no insomnia - no weight loss - no heat intolerance - no tremors - no anxiety - + hair loss (thinning) - no dry skin - no hyperdefecation, no constipation, + diarrhea  She also has a history of HTN, HL, preDM, vit D def., anemia.  As she c/o mm and join pain >> reviewed recent vit D levels: low, at 15. She was advised by PCP to start 10,000 units vitamin D daily >> she is not taking  this.  ROS: Constitutional: see HPI, + nocturia Eyes: no blurry vision, no xerophthalmia ENT: no sore throat, no nodules palpated in throat, no dysphagia/odynophagia, no hoarseness Cardiovascular: no CP/SOB/palpitations/leg swelling Respiratory: occasional cough/no SOB Gastrointestinal: no N/V/D/C/heartburn Musculoskeletal: + both:muscle aches/joint aches Skin: no rashes,  + hair loss  Neurological: no tremors/numbness/tingling/dizziness  I reviewed pt's medications, allergies, PMH, social hx, family hx, and changes were documented in the history of present illness. Otherwise, unchanged from my initial visit note.  PE: BP 114/70 mmHg  Pulse 87  Temp(Src) 98.5 F (36.9 C) (Oral)  Resp 12  Wt 158 lb (71.668 kg)  SpO2 95% Body mass index is 25.3 kg/(m^2). Wt Readings from Last 3 Encounters:  07/06/15 158 lb (71.668 kg)  04/19/15 152 lb (68.947 kg)  04/12/15 158 lb (71.668 kg)   Constitutional: normal weight, in NAD Eyes: surgical pupil R eye, EOMI ENT: moist mucous membranes, no thyromegaly, no cervical lymphadenopathy Cardiovascular: RRR, no MRG, no LE edema Respiratory: CTA B Gastrointestinal: abdomen soft, NT, ND, BS+ Musculoskeletal: no deformities, strength intact in all 4 Skin: moist, warm, no rashes Neurological: no tremor with outstretched hands, DTR normal in all 4  ASSESSMENT: 1. Hypothyroidism after RAI tx   2. H/o Toxic MNG  3. Vit  D def  PLAN:  1. And 2. Patient with h/o thyrotoxicosis 2/2 TMNG, previously with some thyrotoxic sxs: weight loss, anxiety, insomnia; now all resolved after the RAI tx. She developped hypothyroidism after RAI tx >> on LT4 50 mcg 6/7 days (decreased aftre last TSH returned a little low) -  we discussed how to take the LT4 correctly. She will need to take the thyroid hormone every day, with water, >30 minutes before breakfast, separated by >4 hours from acid reflux medications, calcium, iron, multivitamins. She is taking it  correctly - will check the TSH, fT4 today  - RTC in 6 months  3. Vit D def - I believe pt's mm and joint pain is related to her vit D def - we reviewed together the last level, 15 - strongly recommended to start vit D 10,000 IU daily, as advisd by PCP - I also suggested to try adding CoQ10  Office Visit on 07/06/2015  Component Date Value Ref Range Status  . TSH 07/06/2015 0.15* 0.35 - 4.50 uIU/mL Final  . Free T4 07/06/2015 1.05  0.60 - 1.60 ng/dL Final   Thyroid tests indicate over replacement with levothyroxine. We will need to decrease the dose to 25 g daily and repeat her tests in 6-8 weeks.

## 2015-07-07 ENCOUNTER — Encounter: Payer: Self-pay | Admitting: Internal Medicine

## 2015-07-07 MED ORDER — LEVOTHYROXINE SODIUM 25 MCG PO TABS: 25.0000 ug | ORAL_TABLET | Freq: Every day | ORAL | Status: DC

## 2015-07-11 ENCOUNTER — Other Ambulatory Visit: Payer: Medicare Other

## 2015-07-27 ENCOUNTER — Encounter: Payer: Self-pay | Admitting: Internal Medicine

## 2015-07-27 ENCOUNTER — Ambulatory Visit (INDEPENDENT_AMBULATORY_CARE_PROVIDER_SITE_OTHER): Payer: Medicare Other | Admitting: Internal Medicine

## 2015-07-27 VITALS — BP 118/62 | HR 72 | Temp 97.5°F | Resp 16 | Ht 66.25 in | Wt 157.8 lb

## 2015-07-27 DIAGNOSIS — R7309 Other abnormal glucose: Secondary | ICD-10-CM | POA: Diagnosis not present

## 2015-07-27 DIAGNOSIS — Z9114 Patient's other noncompliance with medication regimen: Secondary | ICD-10-CM | POA: Diagnosis not present

## 2015-07-27 DIAGNOSIS — E890 Postprocedural hypothyroidism: Secondary | ICD-10-CM | POA: Diagnosis not present

## 2015-07-27 DIAGNOSIS — E782 Mixed hyperlipidemia: Secondary | ICD-10-CM

## 2015-07-27 DIAGNOSIS — R7303 Prediabetes: Secondary | ICD-10-CM | POA: Diagnosis not present

## 2015-07-27 DIAGNOSIS — I10 Essential (primary) hypertension: Secondary | ICD-10-CM | POA: Diagnosis not present

## 2015-07-27 DIAGNOSIS — Z6825 Body mass index (BMI) 25.0-25.9, adult: Secondary | ICD-10-CM

## 2015-07-27 DIAGNOSIS — Z79899 Other long term (current) drug therapy: Secondary | ICD-10-CM | POA: Diagnosis not present

## 2015-07-27 DIAGNOSIS — Z6827 Body mass index (BMI) 27.0-27.9, adult: Secondary | ICD-10-CM | POA: Insufficient documentation

## 2015-07-27 DIAGNOSIS — E559 Vitamin D deficiency, unspecified: Secondary | ICD-10-CM | POA: Diagnosis not present

## 2015-07-27 DIAGNOSIS — Z6826 Body mass index (BMI) 26.0-26.9, adult: Secondary | ICD-10-CM | POA: Insufficient documentation

## 2015-07-27 LAB — CBC WITH DIFFERENTIAL/PLATELET
BASOS ABS: 0 10*3/uL (ref 0.0–0.1)
Basophils Relative: 1 % (ref 0–1)
EOS PCT: 1 % (ref 0–5)
Eosinophils Absolute: 0 10*3/uL (ref 0.0–0.7)
HEMATOCRIT: 36.4 % (ref 36.0–46.0)
Hemoglobin: 12.1 g/dL (ref 12.0–15.0)
LYMPHS ABS: 1.4 10*3/uL (ref 0.7–4.0)
LYMPHS PCT: 33 % (ref 12–46)
MCH: 27.4 pg (ref 26.0–34.0)
MCHC: 33.2 g/dL (ref 30.0–36.0)
MCV: 82.4 fL (ref 78.0–100.0)
MPV: 8.8 fL (ref 8.6–12.4)
Monocytes Absolute: 0.5 10*3/uL (ref 0.1–1.0)
Monocytes Relative: 12 % (ref 3–12)
NEUTROS ABS: 2.2 10*3/uL (ref 1.7–7.7)
NEUTROS PCT: 53 % (ref 43–77)
Platelets: 317 10*3/uL (ref 150–400)
RBC: 4.42 MIL/uL (ref 3.87–5.11)
RDW: 14 % (ref 11.5–15.5)
WBC: 4.1 10*3/uL (ref 4.0–10.5)

## 2015-07-27 LAB — BASIC METABOLIC PANEL WITH GFR
BUN: 12 mg/dL (ref 7–25)
CO2: 25 mmol/L (ref 20–31)
Calcium: 9.2 mg/dL (ref 8.6–10.4)
Chloride: 107 mmol/L (ref 98–110)
Creat: 0.65 mg/dL (ref 0.50–0.99)
GFR, Est Non African American: >89 mL/min (ref >=60)
GLUCOSE: 82 mg/dL (ref 65–99)
POTASSIUM: 4.5 mmol/L (ref 3.5–5.3)
Sodium: 141 mmol/L (ref 135–146)

## 2015-07-27 LAB — LIPID PANEL
CHOL/HDL RATIO: 2.2 ratio (ref <=5.0)
CHOLESTEROL: 255 mg/dL — AB (ref 125–200)
HDL: 116 mg/dL (ref >=46)
LDL Cholesterol: 131 mg/dL — ABNORMAL HIGH (ref <130)
Triglycerides: 41 mg/dL (ref <150)
VLDL: 8 mg/dL (ref <30)

## 2015-07-27 LAB — HEPATIC FUNCTION PANEL
ALBUMIN: 4.3 g/dL (ref 3.6–5.1)
ALK PHOS: 97 U/L (ref 33–130)
ALT: 13 U/L (ref 6–29)
AST: 20 U/L (ref 10–35)
Bilirubin, Direct: 0.1 mg/dL (ref <=0.2)
Indirect Bilirubin: 0.3 mg/dL (ref 0.2–1.2)
TOTAL PROTEIN: 6.4 g/dL (ref 6.1–8.1)
Total Bilirubin: 0.4 mg/dL (ref 0.2–1.2)

## 2015-07-27 LAB — HEMOGLOBIN A1C
HEMOGLOBIN A1C: 5.6 % (ref <5.7)
MEAN PLASMA GLUCOSE: 114 mg/dL (ref <117)

## 2015-07-27 LAB — MAGNESIUM: MAGNESIUM: 2.2 mg/dL (ref 1.5–2.5)

## 2015-07-27 NOTE — Patient Instructions (Signed)

## 2015-07-27 NOTE — Progress Notes (Signed)
Patient ID: Laura Maynard, female   DOB: 1946/04/29, 69 y.o.   MRN: [id number]   This very nice 69 y.o.female presents for 6 month follow up with hx/o Labile Hypertension, Hyperlipidemia, Pre-Diabetes and Vitamin D Deficiency. Patient has been vehemently poorly compliant with recommended medications over the last 30-35 years that I've known her. Today she reports some difficulty with focusing and concentrating and sometimes "forgetting to pay bills".    Patient is treated for HTN & BP has been controlled at home. Today's BP: 118/62 mmHg. Patient has had no complaints of any cardiac type chest pain, palpitations, dyspnea/orthopnea/PND, dizziness, claudication, or dependent edema.   Hyperlipidemia is Not controlled with diet as she conveniently alleges intolerance to all Chol meds including Zetia!  Last Lipids were NOT at goal with  Cholesterol 291; HDL 134; LDL 145; Triglycerides 59 on 04/19/2015.   Also, the patient has history of PreDiabetes with A1c 5.7% in 2012 and has had no symptoms of reactive hypoglycemia, diabetic polys, paresthesias or visual blurring.  Last A1c was 5.7% on 04/19/2015.   Further, the patient also has history of Vitamin D Deficiency of "10" in 2010 and does not consistently take her recommended dose of Vit D. Last vitamin D was "15" on 04/19/2015.  Medication Sig  . aspirin 81 MG tablet Take 81 mg by mouth daily.    Marland Kitchen  FIORICET  50-325-40 MG  TAKE 1-2 TABLETS BY MOUTH EVERY 4 HOURS AS NEEDED, MAX 6 PER DAY  . VITAMIN D 1000 UNITS tablet Take 1,000 Units by mouth daily.  Marland Kitchen levothyroxine  25 MCG tablet Take 1 tablet (25 mcg total) by mouth daily before breakfast.  . meclizine25 MG tablet TAKE 1 TABLET BY MOUTH THREE TIMES DAILY FOR VERTIGO  . Naproxen  Take 1 tablet by mouth as needed.    . SUMAtriptan Succinate 6 MG/0.5ML  Inject subcutaneous into skin as needed for migraines  . loratadine  10 MG tablet Take 1 tablet (10 mg total) by mouth daily. (Patient not taking: Reported  on 07/06/2015)   Allergies  Allergen Reactions  . Penicillins Hives  . Pravastatin     Myalgia  . Sulfa Antibiotics Hives  . Zetia [Ezetimibe]     Myalgia   PMHx:   Past Medical History  Diagnosis Date  . Cataract   . Hyperlipidemia   . Migraine   . Blind right eye     legally  . Sarcoidosis, lung ([place]) 15 YRS AGO    NO TREATMENTS  . Neuropathy ([place])   . Hypertension   . Prediabetes   . Vitamin D deficiency   . Anemia   . Toxic multinodular goiter 05/27/2014   Immunization History  Administered Date(s) Administered  . Influenza Split 06/30/2013  . Influenza, High Dose Seasonal PF 06/26/2015  . Pneumococcal Conjugate-13 09/14/2014  . Pneumococcal Polysaccharide-23 10/16/2011  . Td 10/16/2011  . Zoster 01/14/2007   Past Surgical History  Procedure Laterality Date  . Eye surgery  1964    right eye  . Back surgery  1970  . Nasal septum surgery  1998  . Total abdominal hysterectomy  2000  . Knee arthroscopy  2002  . Colonoscopy  2006; 03/28/11    2 small adenomas; normal   FHx:    Reviewed / unchanged  SHx:    Reviewed / unchanged  Systems Review:  Constitutional: Denies fever, chills, wt changes, headaches, insomnia, fatigue, night sweats, change in appetite. Eyes: Denies redness, blurred vision, diplopia, discharge, itchy,  watery eyes.  ENT: Denies discharge, congestion, post nasal drip, epistaxis, sore throat, earache, hearing loss, dental pain, tinnitus, vertigo, sinus pain, snoring.  CV: Denies chest pain, palpitations, irregular heartbeat, syncope, dyspnea, diaphoresis, orthopnea, PND, claudication or edema. Respiratory: denies cough, dyspnea, DOE, pleurisy, hoarseness, laryngitis, wheezing.  Gastrointestinal: Denies dysphagia, odynophagia, heartburn, reflux, water brash, abdominal pain or cramps, nausea, vomiting, bloating, diarrhea, constipation, hematemesis, melena, hematochezia  or hemorrhoids. Genitourinary: Denies dysuria, frequency, urgency, nocturia,  hesitancy, discharge, hematuria or flank pain. Musculoskeletal: Denies arthralgias, myalgias, stiffness, jt. swelling, pain, limping or strain/sprain.  Skin: Denies pruritus, rash, hives, warts, acne, eczema or change in skin lesion(s). Neuro: No weakness, tremor, incoordination, spasms, paresthesia or pain. Psychiatric: Denies confusion, memory loss or sensory loss. Endo: Denies change in weight, skin or hair change.  Heme/Lymph: No excessive bleeding, bruising or enlarged lymph nodes.  Physical Exam  BP 118/62 mmHg  Pulse 72  Temp(Src) 97.5 F (36.4 C)  Resp 16  Ht 5' 6.25" (1.683 m)  Wt 157 lb 12.8 oz (71.578 kg)  BMI 25.27 kg/m2  Appears well nourished and in no distress. Eyes: PERRLA, EOMs, conjunctiva no swelling or erythema. Sinuses: No frontal/maxillary tenderness ENT/Mouth: EAC's clear, TM's nl w/o erythema, bulging. Nares clear w/o erythema, swelling, exudates. Oropharynx clear without erythema or exudates. Oral hygiene is good. Tongue normal, non obstructing. Hearing intact.  Neck: Supple. Thyroid nl. Car 2+/2+ without bruits, nodes or JVD. Chest: Respirations nl with BS clear & equal w/o rales, rhonchi, wheezing or stridor.  Cor: Heart sounds normal w/ regular rate and rhythm without sig. murmurs, gallops, clicks, or rubs. Peripheral pulses normal and equal  without edema.  Abdomen: Soft & bowel sounds normal. Non-tender w/o guarding, rebound, hernias, masses, or organomegaly.  Lymphatics: Unremarkable.  Musculoskeletal: Full ROM all peripheral extremities, joint stability, 5/5 strength, and normal gait.  Skin: Warm, dry without exposed rashes, lesions or ecchymosis apparent.  Neuro: Cranial nerves intact, reflexes equal bilaterally. Sensory-motor testing grossly intact. Tendon reflexes grossly intact.  Pysch: Alert & oriented x 3.  Insight and judgement nl & appropriate. No ideations.  Assessment and Plan:  1. Essential hypertension   2. Hyperlipidemia  - Lipid  panel  3. Prediabetes  - Hemoglobin A1c - Insulin, random  4. Vitamin D deficiency  - Vit D  25 hydroxy   5. Postablative hypothyroidism   6. Medication management  - CBC with Differential/Platelet - BASIC METABOLIC PANEL WITH GFR - Hepatic function panel - Magnesium  7. Other abnormal glucose  - Hemoglobin A1c - Insulin, random  8. BMI 25.28,  adult   Recommended regular exercise, BP monitoring, weight control, and discussed med and SE's. Discussed with patient to make lists of importent items/events  & make  "To Do Lists" as she has been so medication non-compliant over the years  Recommended labs to assess and monitor clinical status. Further disposition pending results of labs. I do NOT feel that she is an appropriate candidate for ADD-type meds.  Over 30 minutes of exam, counseling, chart review was performed

## 2015-07-28 DIAGNOSIS — M170 Bilateral primary osteoarthritis of knee: Secondary | ICD-10-CM | POA: Diagnosis not present

## 2015-07-28 LAB — INSULIN, RANDOM: Insulin: 4.3 u[IU]/mL (ref 2.0–19.6)

## 2015-07-28 LAB — VITAMIN D 25 HYDROXY (VIT D DEFICIENCY, FRACTURES): Vit D, 25-Hydroxy: 13 ng/mL — ABNORMAL LOW (ref 30–100)

## 2015-07-30 ENCOUNTER — Encounter: Payer: Self-pay | Admitting: Internal Medicine

## 2015-08-15 ENCOUNTER — Telehealth: Payer: Self-pay | Admitting: Internal Medicine

## 2015-08-15 MED ORDER — LEVOTHYROXINE SODIUM 25 MCG PO TABS: 25.0000 ug | ORAL_TABLET | Freq: Every day | ORAL | Status: DC

## 2015-08-15 NOTE — Telephone Encounter (Signed)
Please call in levothyroxine to [facility]

## 2015-08-20 ENCOUNTER — Encounter: Payer: Self-pay | Admitting: *Deleted

## 2015-08-31 ENCOUNTER — Other Ambulatory Visit (INDEPENDENT_AMBULATORY_CARE_PROVIDER_SITE_OTHER): Payer: Medicare Other

## 2015-08-31 DIAGNOSIS — E890 Postprocedural hypothyroidism: Secondary | ICD-10-CM

## 2015-08-31 LAB — TSH: TSH: 3.53 u[IU]/mL (ref 0.35–4.50)

## 2015-08-31 LAB — T4, FREE: Free T4: 0.77 ng/dL (ref 0.60–1.60)

## 2015-09-01 ENCOUNTER — Other Ambulatory Visit: Payer: Self-pay | Admitting: *Deleted

## 2015-09-01 DIAGNOSIS — M170 Bilateral primary osteoarthritis of knee: Secondary | ICD-10-CM | POA: Diagnosis not present

## 2015-09-01 DIAGNOSIS — M1712 Unilateral primary osteoarthritis, left knee: Secondary | ICD-10-CM | POA: Diagnosis not present

## 2015-09-01 DIAGNOSIS — M1711 Unilateral primary osteoarthritis, right knee: Secondary | ICD-10-CM | POA: Diagnosis not present

## 2015-09-01 DIAGNOSIS — E890 Postprocedural hypothyroidism: Secondary | ICD-10-CM

## 2015-09-06 ENCOUNTER — Ambulatory Visit (INDEPENDENT_AMBULATORY_CARE_PROVIDER_SITE_OTHER): Payer: Medicare Other | Admitting: Internal Medicine

## 2015-09-06 ENCOUNTER — Encounter: Payer: Self-pay | Admitting: Internal Medicine

## 2015-09-06 VITALS — BP 112/68 | HR 92 | Temp 97.8°F | Resp 16 | Ht 66.25 in | Wt 158.0 lb

## 2015-09-06 DIAGNOSIS — H9202 Otalgia, left ear: Secondary | ICD-10-CM

## 2015-09-06 MED ORDER — PREDNISONE 20 MG PO TABS: ORAL_TABLET | ORAL | Status: DC

## 2015-09-06 NOTE — Patient Instructions (Signed)
Please buy fluticasone spray or triamcinolone nasal spray.  It will say compare to either flonase or nasacort.  It is generally around $20 for 120 sprays which should last for close to 3 months.  Please take the prednisone until it is gone.

## 2015-09-06 NOTE — Progress Notes (Signed)
Patient ID: Laura Maynard, female   DOB: Apr 26, 1946, 69 y.o.   MRN: [id number]  HPI  Patient presents to the office for evaluation of cough.  It has been going on for 1 weeks.  Patient reports no coughing currently.  They also endorse postnasal drip and nasal congestion, sinus pressure, left ear pressure, teeth pain..  They have tried antihistamines.  They report that nothing has worked.  She did try taking Aleve D, claritin, tylenol, and 1/2 of a headache tablet.  They denies other sick contacts.  Review of Systems  Constitutional: Positive for malaise/fatigue. Negative for fever and chills.  HENT: Positive for congestion and ear pain. Negative for sore throat.   Respiratory: Negative for cough, shortness of breath and wheezing.   Cardiovascular: Negative for chest pain, claudication and leg swelling.  Neurological: Positive for headaches.    PE:  Filed Vitals:   09/06/15 0903  BP: 112/68  Pulse: 92  Temp: 97.8 F (36.6 C)  Resp: 16    General:  Alert and non-toxic, WDWN, NAD HEENT: NCAT, PERLA, EOM normal, no occular discharge or erythema.  Nasal mucosal edema with sinus tenderness to palpation.  Oropharynx clear with minimal oropharyngeal edema and erythema.  Mucous membranes moist and pink. Neck:  Cervical adenopathy Chest:  RRR no MRGs.  Lungs clear to auscultation A&P with no wheezes rhonchi or rales.   Abdomen: +BS x 4 quadrants, soft, non-tender, no guarding, rigidity, or rebound. Skin: warm and dry no rash Neuro: A&Ox4, CN II-XII grossly intact  Assessment and Plan:   1. Acute otalgia, left -prednisone -flonase -claritin -nasal saline

## 2015-09-08 DIAGNOSIS — M170 Bilateral primary osteoarthritis of knee: Secondary | ICD-10-CM | POA: Diagnosis not present

## 2015-09-08 DIAGNOSIS — M1712 Unilateral primary osteoarthritis, left knee: Secondary | ICD-10-CM | POA: Diagnosis not present

## 2015-09-08 DIAGNOSIS — M1711 Unilateral primary osteoarthritis, right knee: Secondary | ICD-10-CM | POA: Diagnosis not present

## 2015-09-15 DIAGNOSIS — M170 Bilateral primary osteoarthritis of knee: Secondary | ICD-10-CM | POA: Diagnosis not present

## 2015-09-15 DIAGNOSIS — M1711 Unilateral primary osteoarthritis, right knee: Secondary | ICD-10-CM | POA: Diagnosis not present

## 2015-09-15 DIAGNOSIS — M1712 Unilateral primary osteoarthritis, left knee: Secondary | ICD-10-CM | POA: Diagnosis not present

## 2015-09-19 ENCOUNTER — Encounter: Payer: Self-pay | Admitting: Internal Medicine

## 2015-09-29 ENCOUNTER — Encounter: Payer: Self-pay | Admitting: *Deleted

## 2015-10-08 ENCOUNTER — Other Ambulatory Visit: Payer: Self-pay | Admitting: Internal Medicine

## 2015-10-09 ENCOUNTER — Other Ambulatory Visit: Payer: Self-pay | Admitting: Internal Medicine

## 2015-10-09 DIAGNOSIS — R42 Dizziness and giddiness: Secondary | ICD-10-CM

## 2015-10-09 MED ORDER — MECLIZINE HCL 25 MG PO TABS
ORAL_TABLET | ORAL | Status: DC
Start: 2015-10-09 — End: 2017-05-02

## 2015-10-09 NOTE — Telephone Encounter (Signed)
Rx called into [facility].

## 2015-10-13 ENCOUNTER — Ambulatory Visit: Payer: Medicare Other | Admitting: Internal Medicine

## 2015-10-19 ENCOUNTER — Other Ambulatory Visit: Payer: Self-pay | Admitting: Physician Assistant

## 2015-10-20 ENCOUNTER — Other Ambulatory Visit: Payer: Self-pay | Admitting: Physician Assistant

## 2015-10-20 MED ORDER — AMITRIPTYLINE HCL 25 MG PO TABS: ORAL_TABLET | ORAL | Status: DC

## 2015-11-07 ENCOUNTER — Emergency Department (HOSPITAL_COMMUNITY): Payer: Medicare Other

## 2015-11-07 ENCOUNTER — Encounter (HOSPITAL_COMMUNITY): Payer: Self-pay | Admitting: Emergency Medicine

## 2015-11-07 ENCOUNTER — Emergency Department (HOSPITAL_COMMUNITY)
Admission: EM | Admit: 2015-11-07 | Discharge: 2015-11-07 | Disposition: A | Discharge status: Home / Self Care | Payer: Medicare Other | Attending: Emergency Medicine | Admitting: Emergency Medicine

## 2015-11-07 DIAGNOSIS — R112 Nausea with vomiting, unspecified: Secondary | ICD-10-CM

## 2015-11-07 DIAGNOSIS — Z880 Allergy status to penicillin: Secondary | ICD-10-CM | POA: Insufficient documentation

## 2015-11-07 DIAGNOSIS — Z862 Personal history of diseases of the blood and blood-forming organs and certain disorders involving the immune mechanism: Secondary | ICD-10-CM | POA: Diagnosis not present

## 2015-11-07 DIAGNOSIS — I10 Essential (primary) hypertension: Secondary | ICD-10-CM | POA: Diagnosis not present

## 2015-11-07 DIAGNOSIS — Z8669 Personal history of other diseases of the nervous system and sense organs: Secondary | ICD-10-CM | POA: Insufficient documentation

## 2015-11-07 DIAGNOSIS — H5441 Blindness, right eye, normal vision left eye: Secondary | ICD-10-CM | POA: Insufficient documentation

## 2015-11-07 DIAGNOSIS — E0520 Thyrotoxicosis with toxic multinodular goiter without thyrotoxic crisis or storm: Secondary | ICD-10-CM | POA: Diagnosis not present

## 2015-11-07 DIAGNOSIS — R519 Headache, unspecified: Secondary | ICD-10-CM

## 2015-11-07 DIAGNOSIS — Z79899 Other long term (current) drug therapy: Secondary | ICD-10-CM | POA: Diagnosis not present

## 2015-11-07 DIAGNOSIS — Z7982 Long term (current) use of aspirin: Secondary | ICD-10-CM | POA: Insufficient documentation

## 2015-11-07 DIAGNOSIS — R51 Headache: Secondary | ICD-10-CM | POA: Insufficient documentation

## 2015-11-07 LAB — CBC WITH DIFFERENTIAL/PLATELET
BASOS ABS: 0 10*3/uL (ref 0.0–0.1)
Basophils Relative: 0 %
EOS PCT: 0 %
Eosinophils Absolute: 0 10*3/uL (ref 0.0–0.7)
HCT: 36.3 % (ref 36.0–46.0)
HEMOGLOBIN: 11.7 g/dL — AB (ref 12.0–15.0)
LYMPHS ABS: 1 10*3/uL (ref 0.7–4.0)
LYMPHS PCT: 13 %
MCH: 28 pg (ref 26.0–34.0)
MCHC: 32.2 g/dL (ref 30.0–36.0)
MCV: 86.8 fL (ref 78.0–100.0)
Monocytes Absolute: 0.5 10*3/uL (ref 0.1–1.0)
Monocytes Relative: 7 %
NEUTROS ABS: 6.1 10*3/uL (ref 1.7–7.7)
NEUTROS PCT: 80 %
PLATELETS: 314 10*3/uL (ref 150–400)
RBC: 4.18 MIL/uL (ref 3.87–5.11)
RDW: 14.2 % (ref 11.5–15.5)
WBC: 7.7 10*3/uL (ref 4.0–10.5)

## 2015-11-07 LAB — COMPREHENSIVE METABOLIC PANEL
ALK PHOS: 89 U/L (ref 38–126)
ALT: 17 U/L (ref 14–54)
AST: 23 U/L (ref 15–41)
Albumin: 3.9 g/dL (ref 3.5–5.0)
Anion gap: 9 (ref 5–15)
BUN: 12 mg/dL (ref 6–20)
CALCIUM: 8.6 mg/dL — AB (ref 8.9–10.3)
CHLORIDE: 107 mmol/L (ref 101–111)
CO2: 24 mmol/L (ref 22–32)
CREATININE: 0.57 mg/dL (ref 0.44–1.00)
GFR calc Af Amer: >60 mL/min (ref >60)
Glucose, Bld: 119 mg/dL — ABNORMAL HIGH (ref 65–99)
Potassium: 3.5 mmol/L (ref 3.5–5.1)
Sodium: 140 mmol/L (ref 135–145)
Total Bilirubin: 0.4 mg/dL (ref 0.3–1.2)
Total Protein: 6.3 g/dL — ABNORMAL LOW (ref 6.5–8.1)

## 2015-11-07 LAB — URINE MICROSCOPIC-ADD ON
Squamous Epithelial / LPF: NONE SEEN
WBC UA: NONE SEEN WBC/hpf (ref 0–5)

## 2015-11-07 LAB — URINALYSIS, ROUTINE W REFLEX MICROSCOPIC
Bilirubin Urine: NEGATIVE
Glucose, UA: 100 mg/dL — AB
KETONES UR: 15 mg/dL — AB
LEUKOCYTES UA: NEGATIVE
NITRITE: NEGATIVE
PROTEIN: NEGATIVE mg/dL
SPECIFIC GRAVITY, URINE: 1.013 (ref 1.005–1.030)
pH: 8 (ref 5.0–8.0)

## 2015-11-07 LAB — I-STAT TROPONIN, ED: TROPONIN I, POC: 0 ng/mL (ref 0.00–0.08)

## 2015-11-07 LAB — TROPONIN I

## 2015-11-07 LAB — LIPASE, BLOOD: Lipase: 33 U/L (ref 11–51)

## 2015-11-07 MED ORDER — SODIUM CHLORIDE 0.9 % IV BOLUS (SEPSIS)
1000.0000 mL | Freq: Once | INTRAVENOUS | Status: AC
  Administered 2015-11-07: 1000 mL via INTRAVENOUS

## 2015-11-07 MED ORDER — ONDANSETRON 4 MG PO TBDP
4.0000 mg | ORAL_TABLET | Freq: Once | ORAL | Status: AC | PRN
  Administered 2015-11-07: 4 mg via ORAL
  Filled 2015-11-07: qty 1

## 2015-11-07 MED ORDER — ONDANSETRON HCL 4 MG/2ML IJ SOLN: 4.0000 mg | Freq: Once | INTRAMUSCULAR | Status: DC | PRN

## 2015-11-07 MED ORDER — ONDANSETRON 4 MG PO TBDP: 4.0000 mg | ORAL_TABLET | Freq: Three times a day (TID) | ORAL | Status: DC | PRN

## 2015-11-07 MED ORDER — ONDANSETRON HCL 4 MG/2ML IJ SOLN
4.0000 mg | Freq: Once | INTRAMUSCULAR | Status: AC
  Administered 2015-11-07: 4 mg via INTRAVENOUS

## 2015-11-07 MED ORDER — ONDANSETRON HCL 4 MG/2ML IJ SOLN
4.0000 mg | Freq: Once | INTRAMUSCULAR | Status: AC
  Administered 2015-11-07: 4 mg via INTRAVENOUS
  Filled 2015-11-07: qty 2

## 2015-11-07 MED ORDER — METOCLOPRAMIDE HCL 10 MG PO TABS: 10.0000 mg | ORAL_TABLET | Freq: Four times a day (QID) | ORAL | Status: DC

## 2015-11-07 MED ORDER — DIPHENHYDRAMINE HCL 50 MG/ML IJ SOLN
25.0000 mg | Freq: Once | INTRAMUSCULAR | Status: AC
  Administered 2015-11-07: 25 mg via INTRAVENOUS
  Filled 2015-11-07: qty 1

## 2015-11-07 MED ORDER — HYDROMORPHONE HCL 1 MG/ML IJ SOLN
1.0000 mg | Freq: Once | INTRAMUSCULAR | Status: AC
  Administered 2015-11-07: 1 mg via INTRAVENOUS
  Filled 2015-11-07: qty 1

## 2015-11-07 MED ORDER — MORPHINE SULFATE (PF) 4 MG/ML IV SOLN
4.0000 mg | Freq: Once | INTRAVENOUS | Status: AC
  Administered 2015-11-07: 4 mg via INTRAVENOUS
  Filled 2015-11-07: qty 1

## 2015-11-07 MED ORDER — ONDANSETRON HCL 4 MG/2ML IJ SOLN
INTRAMUSCULAR | Status: AC
  Filled 2015-11-07: qty 2

## 2015-11-07 MED ORDER — METOCLOPRAMIDE HCL 5 MG/ML IJ SOLN
10.0000 mg | Freq: Once | INTRAMUSCULAR | Status: AC
  Administered 2015-11-07: 10 mg via INTRAVENOUS
  Filled 2015-11-07: qty 2

## 2015-11-07 MED ORDER — LORAZEPAM 2 MG/ML IJ SOLN
1.0000 mg | Freq: Once | INTRAMUSCULAR | Status: AC
  Administered 2015-11-07: 1 mg via INTRAVENOUS
  Filled 2015-11-07: qty 1

## 2015-11-07 MED ORDER — IBUPROFEN 400 MG PO TABS: 400.0000 mg | ORAL_TABLET | Freq: Four times a day (QID) | ORAL | Status: DC | PRN

## 2015-11-07 MED ORDER — PROCHLORPERAZINE EDISYLATE 5 MG/ML IJ SOLN
10.0000 mg | Freq: Four times a day (QID) | INTRAMUSCULAR | Status: DC | PRN
  Administered 2015-11-07: 10 mg via INTRAVENOUS
  Filled 2015-11-07: qty 2

## 2015-11-07 MED ORDER — SODIUM CHLORIDE 0.9 % IV SOLN
8.0000 mg | Freq: Once | INTRAVENOUS | Status: DC
  Filled 2015-11-07: qty 4

## 2015-11-07 MED ORDER — VALPROATE SODIUM 500 MG/5ML IV SOLN
500.0000 mg | Freq: Once | INTRAVENOUS | Status: AC
  Administered 2015-11-07: 500 mg via INTRAVENOUS
  Filled 2015-11-07: qty 5

## 2015-11-07 NOTE — ED Notes (Addendum)
Lipase, CBC, CMP, and Lipase still need to be collected

## 2015-11-07 NOTE — Discharge Instructions (Signed)
You have been seen today for headache and vomiting. Your imaging and lab tests showed no abnormalities. Follow up with PCP as soon as possible for reevaluation and chronic management. Return to ED should symptoms worsen. Drink plenty of fluids to stay hydrated.

## 2015-11-07 NOTE — ED Notes (Signed)
Unable to ambulate pt due to she was unable to maintain balance

## 2015-11-07 NOTE — ED Notes (Signed)
EKG and cardiac monitoring delayed.  Pt sts as soon as she is able to stop gaging or vomiting she will be able to sit still long enough to have it done

## 2015-11-07 NOTE — ED Notes (Signed)
Ambulated pt to door then returned to bed and 10 minutes resumed vomiting.

## 2015-11-07 NOTE — ED Notes (Signed)
Pt to ED from home; pt went to sleep last night at 8pm after eating pizza for dinner; pt woke up around 3 am with a headache and started throwing up; pt states she has thrown up several things; pt gagging/dry heaving in triage.

## 2015-11-07 NOTE — ED Notes (Signed)
Pt unable to stand to complete Ortho vitals,pt tried to complete task but tended to not be able to keep balance.

## 2015-11-07 NOTE — ED Provider Notes (Signed)
CSN: [id number]     Arrival date & time 11/07/15  0611 History   None    Chief Complaint  Patient presents with  . Emesis     (Consider location/radiation/quality/duration/timing/severity/associated sxs/prior Treatment) HPI   MILLER REFFNER is a 70 y.o. female, with a history of hypertension, anemia, goiter, migraines, and right eye blindness, presenting to the ED with frontal bilateral headache accompanied by N/V beginning around 3 am this morning. Pt states the headache woke her up from a sleep. Pt rates her pain at 8/10, achy, non-radiating. Pt also complains dizziness. Pt states that she has migraines, but this headache is completely different than her migraines. Pt denies fever/chills, recent illness, visual disturbances, neuro deficits, or any other complaints.     Past Medical History  Diagnosis Date  . Cataract   . Hyperlipidemia   . Migraine   . Blind right eye     legally  . Sarcoidosis, lung ([place]) 15 YRS AGO    NO TREATMENTS  . Neuropathy ([place])   . Hypertension   . Prediabetes   . Vitamin D deficiency   . Anemia   . Toxic multinodular goiter 05/27/2014   Past Surgical History  Procedure Laterality Date  . Eye surgery  1964    right eye  . Back surgery  1970  . Nasal septum surgery  1998  . Total abdominal hysterectomy  2000  . Knee arthroscopy  2002  . Colonoscopy  2006; 03/28/11    2 small adenomas; normal   Family History  Problem Relation Age of Onset  . Lung cancer Sister 31  . Throat cancer Brother 47  . Colon cancer Neg Hx   . COPD Sister   . Diabetes Brother   . Stroke Mother   . Emphysema Father    Social History  Substance Use Topics  . Smoking status: Never Smoker   . Smokeless tobacco: Never Used  . Alcohol Use: No     Comment: RARE   OB History    No data available     Review of Systems  Constitutional: Negative for fever, chills and diaphoresis.  Respiratory: Negative for shortness of breath.   Cardiovascular: Negative for chest  pain.  Gastrointestinal: Positive for nausea and vomiting.  Neurological: Positive for headaches.  All other systems reviewed and are negative.     Allergies  Penicillins; Pravastatin; Sulfa antibiotics; and Zetia  Home Medications   Prior to Admission medications   Medication Sig Start Date End Date Taking? Authorizing Provider  aspirin 81 MG tablet Take 81 mg by mouth daily.     Yes Historical Provider, MD  butalbital-acetaminophen-caffeine (FIORICET, ESGIC) 50-325-40 MG tablet Take 1-2 tablets by mouth every 4 (four) hours as needed. Migraines/headaches 10/11/15  Yes Historical Provider, MD  levothyroxine (LEVOTHROID) 25 MCG tablet Take 1 tablet (25 mcg total) by mouth daily before breakfast. 08/15/15  Yes Philemon Kingdom, MD  loratadine (CLARITIN) 10 MG tablet Take 10 mg by mouth daily as needed for allergies.  04/19/15  Yes Historical Provider, MD  meclizine (ANTIVERT) 25 MG tablet Take 1 tablet 3 x d if needed for Dizziness / Vertigo Patient taking differently: Take 25 mg by mouth 3 (three) times daily as needed for dizziness. Take 1 tablet 3 x d if needed for Dizziness / Vertigo 10/09/15  Yes [name], MD  naproxen sodium (ANAPROX) 220 MG tablet Take 440 mg by mouth 2 (two) times daily as needed (pain).   Yes Historical Provider, MD  Pseudoephedrine-Naproxen Na (ALEVE-D SINUS & COLD PO) Take 1 tablet by mouth 2 (two) times daily as needed (cough/cold symptoms). PRN   Yes Historical Provider, MD  SUMAtriptan Succinate 6 MG/0.5ML SOTJ Inject subcutaneous into skin as needed for migraines 02/15/14  Yes [name], MD  ibuprofen (ADVIL,MOTRIN) 400 MG tablet Take 1 tablet (400 mg total) by mouth every 6 (six) hours as needed for headache, mild pain or moderate pain. 11/07/15   Shawn C Joy, PA-C  metoCLOPramide (REGLAN) 10 MG tablet Take 1 tablet (10 mg total) by mouth every 6 (six) hours. 11/07/15   Shawn C Joy, PA-C  ondansetron (ZOFRAN ODT) 4 MG disintegrating tablet Take 1 tablet  (4 mg total) by mouth every 8 (eight) hours as needed for nausea or vomiting. 11/07/15   Shawn C Joy, PA-C   BP 125/73 mmHg  Pulse 85  Temp(Src) 97.3 F (36.3 C) (Oral)  Resp 17  SpO2 99% Physical Exam  Constitutional: She is oriented to person, place, and time. She appears well-developed and well-nourished. No distress.  HENT:  Head: Normocephalic and atraumatic.  Eyes: Conjunctivae and EOM are normal. Pupils are equal, round, and reactive to light.  Left pupil is appropriately reactive to light. Right pupil is surgical secondary to a childhood injury. Pt states she can see light out of this eye, but not much more, which has been the case since childhood.   Neck: Normal range of motion. Neck supple.  Cardiovascular: Normal rate, regular rhythm, normal heart sounds and intact distal pulses.   Pulmonary/Chest: Effort normal and breath sounds normal. No respiratory distress.  Abdominal: Soft. Bowel sounds are normal. There is no tenderness. There is no guarding.  Musculoskeletal: She exhibits no edema or tenderness.  Full ROM in all extremities and spine.   Lymphadenopathy:    She has no cervical adenopathy.  Neurological: She is alert and oriented to person, place, and time. She has normal reflexes.  No sensory deficits. Strength 5/5 in all extremities. No gait disturbance. Coordination intact. Cranial nerves III-XII grossly intact. No facial droop.   Skin: Skin is warm and dry. She is not diaphoretic.  Nursing note and vitals reviewed.   ED Course  Procedures (including critical care time) Labs Review Labs Reviewed  URINALYSIS, ROUTINE W REFLEX MICROSCOPIC (NOT AT [facility]) - Abnormal; Notable for the following:    APPearance CLOUDY (*)    Glucose, UA 100 (*)    Hgb urine dipstick SMALL (*)    Ketones, ur 15 (*)    All other components within normal limits  URINE MICROSCOPIC-ADD ON - Abnormal; Notable for the following:    Bacteria, UA RARE (*)    All other components within normal  limits  CBC WITH DIFFERENTIAL/PLATELET - Abnormal; Notable for the following:    Hemoglobin 11.7 (*)    All other components within normal limits  COMPREHENSIVE METABOLIC PANEL - Abnormal; Notable for the following:    Glucose, Bld 119 (*)    Calcium 8.6 (*)    Total Protein 6.3 (*)    All other components within normal limits  URINE CULTURE  TROPONIN I  LIPASE, BLOOD  I-STAT TROPOININ, ED    Imaging Review Ct Head Wo Contrast  11/07/2015  CLINICAL DATA:  Worst headache with nausea and vomiting EXAM: CT HEAD WITHOUT CONTRAST TECHNIQUE: Contiguous axial images were obtained from the base of the skull through the vertex without intravenous contrast. COMPARISON:  None. FINDINGS: There is no evidence of mass effect, midline shift or  extra-axial fluid collections. There is no evidence of a space-occupying lesion or intracranial hemorrhage. There is no evidence of a cortical-based area of acute infarction. The ventricles and sulci are appropriate for the patient's age. The basal cisterns are patent. Visualized portions of the orbits are unremarkable. The visualized portions of the paranasal sinuses and mastoid air cells are unremarkable. The osseous structures are unremarkable. IMPRESSION: No acute intracranial pathology. Electronically Signed   By: Kathreen Devoid   On: 11/07/2015 08:19   I have personally reviewed and evaluated these images and lab results as part of my medical decision-making.   EKG Interpretation   Date/Time:  Tuesday November 07 2015 07:26:03 EST Ventricular Rate:  86 PR Interval:    QRS Duration: 97 QT Interval:  366 QTC Calculation: 438 R Axis:   64 Text Interpretation:  Atrial fibrillation Probable anteroseptal infarct,  old Borderline T abnormalities, inferior leads Baseline wander in lead(s)  I II aVR aVF V1 V2 V3 V4 V5 V6 No old tracing to compare Confirmed by  CAMPOS  MD, KEVIN ([id number]) on 11/07/2015 8:11:46 AM      Medications  prochlorperazine (COMPAZINE)  injection 10 mg (10 mg Intravenous Given 11/07/15 0950)  ondansetron (ZOFRAN-ODT) disintegrating tablet 4 mg (4 mg Oral Given 11/07/15 0631)  sodium chloride 0.9 % bolus 1,000 mL (0 mLs Intravenous Stopped 11/07/15 0808)  ondansetron (ZOFRAN) injection 4 mg (4 mg Intravenous Given 11/07/15 0728)  metoCLOPramide (REGLAN) injection 10 mg (10 mg Intravenous Given 11/07/15 0728)  morphine 4 MG/ML injection 4 mg (4 mg Intravenous Given 11/07/15 0728)  HYDROmorphone (DILAUDID) injection 1 mg (1 mg Intravenous Given 11/07/15 0827)  diphenhydrAMINE (BENADRYL) injection 25 mg (25 mg Intravenous Given 11/07/15 0950)  sodium chloride 0.9 % bolus 1,000 mL (0 mLs Intravenous Stopped 11/07/15 1117)  LORazepam (ATIVAN) injection 1 mg (1 mg Intravenous Given 11/07/15 0950)  valproate (DEPACON) 500 mg in dextrose 5 % 50 mL IVPB (0 mg Intravenous Stopped 11/07/15 1117)  ondansetron (ZOFRAN) injection 4 mg (4 mg Intravenous Given 11/07/15 1441)   Orthostatic VS for the past 24 hrs:  BP- Lying Pulse- Lying BP- Sitting Pulse- Sitting  11/07/15 1140 108/66 mmHg 70 123/76 mmHg 77    MDM   Final diagnoses:  Acute nonintractable headache, unspecified headache type  Non-intractable vomiting with nausea, vomiting of unspecified type    [name] presents with bilateral frontal headache, different than her migraines, that came on suddenly with nausea and vomiting at 3 AM this morning.  Findings and plan of care discussed with Jola Schmidt, MD.  This patient's presentation could be due to a variation in her migraines, however, the presentation history combined with her age make ruling out an intracranial hemorrhage a necessity. Patient is nontoxic appearing, afebrile, not tachycardic, not tachypneic, maintains SPO2 of 100% on room air, and is in no apparent distress. Patient has no signs of sepsis or other serious or life-threatening condition. Head CT shows no acute abnormalities. 9:18 AM Pt was reassessed and states her pain hasn't  improved and she continues to vomit. 10:20 AM Patient's nausea is now well-controlled. States her headache has lessened halfway through the Depakote drip.  11:02 AM Patient states she is now pain-free and has not vomited for the past hour and a half.  Patient had to be held here in the ED until she was able to ambulate safely. The medications administered here in the ED were effective at taking away the patient's pain and nausea, but had the  side effect of making the patient unsteady on her feet. Patient was carefully observed here in the ED until this sensation resolved. 2:20 PM Patient now states that she feels ready to go home. She is still pain free and her nausea is still resolved. Patient was ambulated without difficulty.  2:47 PM Pt had an episode when she tried to sit up too quickly where she began to feel nauseous again. A dose of Zofran alleviated her nausea. Patient was ambulated again and ambulated without difficulty.   Filed Vitals:   11/07/15 0622 11/07/15 1003 11/07/15 1138 11/07/15 1440  BP: 105/84 98/58 108/66 125/73  Pulse: 76 90 76 85  Temp: 97.3 F (36.3 C)  97.3 F (36.3 C)   TempSrc: Oral  Oral   Resp: 20 14 12 17   SpO2: 100% 94% 98% 99%     Lorayne Bender, PA-C 11/07/15 [place], PA-C 11/07/15 [place], MD 11/07/15 1555

## 2015-11-07 NOTE — ED Notes (Signed)
MD Campos at bedside.  

## 2015-11-08 LAB — URINE CULTURE: CULTURE: NO GROWTH

## 2015-11-09 ENCOUNTER — Other Ambulatory Visit: Payer: Self-pay | Admitting: *Deleted

## 2015-11-09 ENCOUNTER — Other Ambulatory Visit (INDEPENDENT_AMBULATORY_CARE_PROVIDER_SITE_OTHER): Payer: Medicare Other

## 2015-11-09 DIAGNOSIS — E890 Postprocedural hypothyroidism: Secondary | ICD-10-CM

## 2015-11-09 LAB — T4, FREE: FREE T4: 0.83 ng/dL (ref 0.60–1.60)

## 2015-11-09 LAB — TSH: TSH: 7.23 u[IU]/mL — ABNORMAL HIGH (ref 0.35–4.50)

## 2015-11-09 MED ORDER — LEVOTHYROXINE SODIUM 50 MCG PO TABS: 50.0000 ug | ORAL_TABLET | Freq: Every day | ORAL | Status: DC

## 2015-12-11 ENCOUNTER — Encounter: Payer: Self-pay | Admitting: *Deleted

## 2016-01-04 ENCOUNTER — Ambulatory Visit: Payer: Medicare Other | Admitting: Internal Medicine

## 2016-01-13 NOTE — Patient Instructions (Signed)
Recommend Adult Low Dose Aspirin or   coated  Aspirin 81 mg daily   To reduce risk of Colon Cancer 20 %,   Skin Cancer 26 % ,   Melanoma 46%   and   Pancreatic cancer 60%   ++++++++++++++++++++++++++++++++++++++++++++++++++++++ Vitamin D goal   is between 70-100.   Please make sure that you are taking your Vitamin D as directed.   It is very important as a natural anti-inflammatory   helping hair, skin, and nails, as well as reducing stroke and heart attack risk.   It helps your bones and helps with mood.  It also decreases numerous cancer risks so please take it as directed.   Low Vit D is associated with a 200-300% higher risk for CANCER   and 200-300% higher risk for HEART   ATTACK  &  STROKE.   .....................................Marland Kitchen  It is also associated with higher death rate at younger ages,   autoimmune diseases like Rheumatoid arthritis, Lupus, Multiple Sclerosis.     Also many other serious conditions, like depression, Alzheimer's  Dementia, infertility, muscle aches, fatigue, fibromyalgia - just to name a few.  ++++++++++++++++++++++++++++++++++++++++++++++++  Recommend the book "The END of DIETING" by Dr Excell Seltzer   & the book "The END of DIABETES " by Dr Excell Seltzer  At [email or web address] - get book & Audio CD's     Being diabetic has a  300% increased risk for heart attack, stroke, cancer, and alzheimer- type vascular dementia. It is very important that you work harder with diet by avoiding all foods that are white. Avoid white rice (brown & wild rice is OK), white potatoes (sweetpotatoes in moderation is OK), White bread or wheat bread or anything made out of white flour like bagels, donuts, rolls, buns, biscuits, cakes, pastries, cookies, pizza crust, and pasta (made from white flour & egg whites) - vegetarian pasta or spinach or wheat pasta is OK. Multigrain breads like Arnold's or Pepperidge [employer], or multigrain sandwich thins or flatbreads.  Diet,  exercise and weight loss can reverse and cure diabetes in the early stages.  Diet, exercise and weight loss is very important in the control and prevention of complications of diabetes which affects every system in your body, ie. Brain - dementia/stroke, eyes - glaucoma/blindness, heart - heart attack/heart failure, kidneys - dialysis, stomach - gastric paralysis, intestines - malabsorption, nerves - severe painful neuritis, circulation - gangrene & loss of a leg(s), and finally cancer and Alzheimers.    I recommend avoid fried & greasy foods,  sweets/candy, white rice (brown or wild rice or Quinoa is OK), white potatoes (sweet potatoes are OK) - anything made from white flour - bagels, doughnuts, rolls, buns, biscuits,white and wheat breads, pizza crust and traditional pasta made of white flour & egg white(vegetarian pasta or spinach or wheat pasta is OK).  Multi-grain bread is OK - like multi-grain flat bread or sandwich thins. Avoid alcohol in excess. Exercise is also important.    Eat all the vegetables you want - avoid meat, especially red meat and dairy - especially cheese.  Cheese is the most concentrated form of trans-fats which is the worst thing to clog up our arteries. Veggie cheese is OK which can be found in the fresh produce section at [employer] or [employer] or [employer]  ++++++++++++++++++++++++++++++++++++++++++++++++++ DASH Eating Plan  DASH stands for "Dietary Approaches to Stop Hypertension."   The DASH eating plan is a healthy eating plan that has been shown to reduce high blood  pressure (hypertension). Additional health benefits may include reducing the risk of type 2 diabetes mellitus, heart disease, and stroke. The DASH eating plan may also help with weight loss.  WHAT DO I NEED TO KNOW ABOUT THE DASH EATING PLAN?  For the DASH eating plan, you will follow these general guidelines:  Choose foods with a percent daily value for sodium of less than 5% (as listed on the food  label).  Use salt-free seasonings or herbs instead of table salt or sea salt.  Check with your health care provider or pharmacist before using salt substitutes.  Eat lower-sodium products, often labeled as "lower sodium" or "no salt added."  Eat fresh foods.  Eat more vegetables, fruits, and low-fat dairy products.    Choose whole grains. Look for the word "whole" as the first word in the ingredient list.  Choose fish   Limit sweets, desserts, sugars, and sugary drinks.  Choose heart-healthy fats.  Eat veggie cheese   Eat more home-cooked food and less restaurant, buffet, and fast food.  Limit fried foods.  [place]uffaker foods using methods other than frying.  Limit canned vegetables. If you do use them, rinse them well to decrease the sodium.  When eating at a restaurant, ask that your food be prepared with less salt, or no salt if possible.                      WHAT FOODS CAN I EAT?  Read Dr Fara Olden Fuhrman's books on The End of Dieting & The End of Diabetes  Grains  Whole grain or whole wheat bread. Brown rice. Whole grain or whole wheat pasta. Quinoa, bulgur, and whole grain cereals. Low-sodium cereals. Corn or whole wheat flour tortillas. Whole grain cornbread. Whole grain crackers. Low-sodium crackers.  Vegetables  Fresh or frozen vegetables (raw, steamed, roasted, or grilled). Low-sodium or reduced-sodium tomato and vegetable juices. Low-sodium or reduced-sodium tomato sauce and paste. Low-sodium or reduced-sodium canned vegetables.   Fruits  All fresh, canned (in natural juice), or frozen fruits.  Protein Products   All fish and seafood.  Dried beans, peas, or lentils. Unsalted nuts and seeds. Unsalted canned beans.  Dairy  Low-fat dairy products, such as skim or 1% milk, 2% or reduced-fat cheeses, low-fat ricotta or cottage cheese, or plain low-fat yogurt. Low-sodium or reduced-sodium cheeses.  Fats and Oils  Tub margarines without trans fats. Light or  reduced-fat mayonnaise and salad dressings (reduced sodium). Avocado. Safflower, olive, or canola oils. Natural peanut or almond butter.  Other  Unsalted popcorn and pretzels. The items listed above may not be a complete list of recommended foods or beverages. Contact your dietitian for more options.  +++++++++++++++++++++++++++++++++++++++++++  WHAT FOODS ARE NOT RECOMMENDED?  Grains/ White flour or wheat flour  White bread. White pasta. White rice. Refined cornbread. Bagels and croissants. Crackers that contain trans fat.  Vegetables  Creamed or fried vegetables. Vegetables in a . Regular canned vegetables. Regular canned tomato sauce and paste. Regular tomato and vegetable juices.  Fruits  Dried fruits. Canned fruit in light or heavy syrup. Fruit juice.  Meat and Other Protein Products  Meat in general - RED mwaet & White meat.  Fatty cuts of meat. Ribs, chicken wings, bacon, sausage, bologna, salami, chitterlings, fatback, hot dogs, bratwurst, and packaged luncheon meats.  Dairy  Whole or 2% milk, cream, half-and-half, and cream cheese. Whole-fat or sweetened yogurt. Full-fat cheeses or blue cheese. Nondairy creamers and whipped toppings. Processed cheese, cheese spreads, or  cheese curds.  Condiments  Onion and garlic salt, seasoned salt, table salt, and sea salt. Canned and packaged gravies. Worcestershire sauce. Tartar sauce. Barbecue sauce. Teriyaki sauce. Soy sauce, including reduced sodium. Steak sauce. Fish sauce. Oyster sauce. Cocktail sauce. Horseradish. Ketchup and mustard. Meat flavorings and tenderizers. Bouillon cubes. Hot sauce. Tabasco sauce. Marinades. Taco seasonings. Relishes.  Fats and Oils Butter, stick margarine, lard, shortening and bacon fat. Coconut, palm kernel, or palm oils. Regular salad dressings.  Pickles and olives. Salted popcorn and pretzels.    Preventive Care for Adults  A healthy lifestyle and preventive care can promote health and  wellness. Preventive health guidelines for women include the following key practices.  A routine yearly physical is a good way to check with your health care provider about your health and preventive screening. It is a chance to share any concerns and updates on your health and to receive a thorough exam.  Visit your dentist for a routine exam and preventive care every 6 months. Brush your teeth twice a day and floss once a day. Good oral hygiene prevents tooth decay and gum disease.  The frequency of eye exams is based on your age, health, family medical history, use of contact lenses, and other factors. Follow your health care provider's recommendations for frequency of eye exams.  Eat a healthy diet. Foods like vegetables, fruits, whole grains, low-fat dairy products, and lean protein foods contain the nutrients you need without too many calories. Decrease your intake of foods high in solid fats, added sugars, and salt. Eat the right amount of calories for you.Get information about a proper diet from your health care provider, if necessary.  Regular physical exercise is one of the most important things you can do for your health. Most adults should get at least 150 minutes of moderate-intensity exercise (any activity that increases your heart rate and causes you to sweat) each week. In addition, most adults need muscle-strengthening exercises on 2 or more days a week.  Maintain a healthy weight. The body mass index (BMI) is a screening tool to identify possible weight problems. It provides an estimate of body fat based on height and weight. Your health care provider can find your BMI and can help you achieve or maintain a healthy weight.For adults 20 years and older:  A BMI below 18.5 is considered underweight.  A BMI of 18.5 to 24.9 is normal.  A BMI of 25 to 29.9 is considered overweight.  A BMI of 30 and above is considered obese.  Maintain normal blood lipids and cholesterol levels by  exercising and minimizing your intake of saturated fat. Eat a balanced diet with plenty of fruit and vegetables. If your lipid or cholesterol levels are high, you are over 50, or you are at high risk for heart disease, you may need your cholesterol levels checked more frequently.Ongoing high lipid and cholesterol levels should be treated with medicines if diet and exercise are not working.  If you smoke, find out from your health care provider how to quit. If you do not use tobacco, do not start.  Lung cancer screening is recommended for adults aged 55-80 years who are at high risk for developing lung cancer because of a history of smoking. A yearly low-dose CT scan of the lungs is recommended for people who have at least a 30-pack-year history of smoking and are a current smoker or have quit within the past 15 years. A pack year of smoking is smoking an average  of 1 pack of cigarettes a day for 1 year (for example: 1 pack a day for 30 years or 2 packs a day for 15 years). Yearly screening should continue until the smoker has stopped smoking for at least 15 years. Yearly screening should be stopped for people who develop a health problem that would prevent them from having lung cancer treatment.  Avoid use of street drugs. Do not share needles with anyone. Ask for help if you need support or instructions about stopping the use of drugs.  High blood pressure causes heart disease and increases the risk of stroke.  Ongoing high blood pressure should be treated with medicines if weight loss and exercise do not work.  If you are 41-22 years old, ask your health care provider if you should take aspirin to prevent strokes.  Diabetes screening involves taking a blood sample to check your fasting blood sugar level. This should be done once every 3 years, after age 56, if you are within normal weight and without risk factors for diabetes. Testing should be considered at a younger age or be carried out more  frequently if you are overweight and have at least 1 risk factor for diabetes.  Breast cancer screening is essential preventive care for women. You should practice "breast self-awareness." This means understanding the normal appearance and feel of your breasts and may include breast self-examination. Any changes detected, no matter how small, should be reported to a health care provider. Women in their 75s and 30s should have a clinical breast exam (CBE) by a health care provider as part of a regular health exam every 1 to 3 years. After age 20, women should have a CBE every year. Starting at age 31, women should consider having a mammogram (breast X-ray test) every year. Women who have a family history of breast cancer should talk to their health care provider about genetic screening. Women at a high risk of breast cancer should talk to their health care providers about having an MRI and a mammogram every year.  Breast cancer gene (BRCA)-related cancer risk assessment is recommended for women who have family members with BRCA-related cancers. BRCA-related cancers include breast, ovarian, tubal, and peritoneal cancers. Having family members with these cancers may be associated with an increased risk for harmful changes (mutations) in the breast cancer genes BRCA1 and BRCA2. Results of the assessment will determine the need for genetic counseling and BRCA1 and BRCA2 testing.  Routine pelvic exams to screen for cancer are no longer recommended for nonpregnant women who are considered low risk for cancer of the pelvic organs (ovaries, uterus, and vagina) and who do not have symptoms. Ask your health care provider if a screening pelvic exam is right for you.  If you have had past treatment for cervical cancer or a condition that could lead to cancer, you need Pap tests and screening for cancer for at least 20 years after your treatment. If Pap tests have been discontinued, your risk factors (such as having a new  sexual partner) need to be reassessed to determine if screening should be resumed. Some women have medical problems that increase the chance of getting cervical cancer. In these cases, your health care provider may recommend more frequent screening and Pap tests.    Colorectal cancer can be detected and often prevented. Most routine colorectal cancer screening begins at the age of 36 years and continues through age 38 years. However, your health care provider may recommend screening at an earlier  age if you have risk factors for colon cancer. On a yearly basis, your health care provider may provide home test kits to check for hidden blood in the stool. Use of a small camera at the end of a tube, to directly examine the colon (sigmoidoscopy or colonoscopy), can detect the earliest forms of colorectal cancer. Talk to your health care provider about this at age 86, when routine screening begins. Direct exam of the colon should be repeated every 5-10 years through age 56 years, unless early forms of pre-cancerous polyps or small growths are found.  Osteoporosis is a disease in which the bones lose minerals and strength with aging. This can result in serious bone fractures or breaks. The risk of osteoporosis can be identified using a bone density scan. Women ages 71 years and over and women at risk for fractures or osteoporosis should discuss screening with their health care providers. Ask your health care provider whether you should take a calcium supplement or vitamin D to reduce the rate of osteoporosis.  Menopause can be associated with physical symptoms and risks. Hormone replacement therapy is available to decrease symptoms and risks. You should talk to your health care provider about whether hormone replacement therapy is right for you.  Use sunscreen. Apply sunscreen liberally and repeatedly throughout the day. You should seek shade when your shadow is shorter than you. Protect yourself by wearing long  sleeves, pants, a wide-brimmed hat, and sunglasses year round, whenever you are outdoors.  Once a month, do a whole body skin exam, using a mirror to look at the skin on your back. Tell your health care provider of new moles, moles that have irregular borders, moles that are larger than a pencil eraser, or moles that have changed in shape or color.  Stay current with required vaccines (immunizations).  Influenza vaccine. All adults should be immunized every year.  Tetanus, diphtheria, and acellular pertussis (Td, Tdap) vaccine. Pregnant women should receive 1 dose of Tdap vaccine during each pregnancy. The dose should be obtained regardless of the length of time since the last dose. Immunization is preferred during the 27th-36th week of gestation. An adult who has not previously received Tdap or who does not know her vaccine status should receive 1 dose of Tdap. This initial dose should be followed by tetanus and diphtheria toxoids (Td) booster doses every 10 years. Adults with an unknown or incomplete history of completing a 3-dose immunization series with Td-containing vaccines should begin or complete a primary immunization series including a Tdap dose. Adults should receive a Td booster every 10 years.    Zoster vaccine. One dose is recommended for adults aged 14 years or older unless certain conditions are present.    Pneumococcal 13-valent conjugate (PCV13) vaccine. When indicated, a person who is uncertain of her immunization history and has no record of immunization should receive the PCV13 vaccine. An adult aged 61 years or older who has certain medical conditions and has not been previously immunized should receive 1 dose of PCV13 vaccine. This PCV13 should be followed with a dose of pneumococcal polysaccharide (PPSV23) vaccine. The PPSV23 vaccine dose should be obtained at least 8 weeks after the dose of PCV13 vaccine. An adult aged 72 years or older who has certain medical conditions and  previously received 1 or more doses of PPSV23 vaccine should receive 1 dose of PCV13. The PCV13 vaccine dose should be obtained 1 or more years after the last PPSV23 vaccine dose.    Pneumococcal  polysaccharide (PPSV23) vaccine. When PCV13 is also indicated, PCV13 should be obtained first. All adults aged 4 years and older should be immunized. An adult younger than age 8 years who has certain medical conditions should be immunized. Any person who resides in a nursing home or long-term care facility should be immunized. An adult smoker should be immunized. People with an immunocompromised condition and certain other conditions should receive both PCV13 and PPSV23 vaccines. People with human immunodeficiency virus (HIV) infection should be immunized as soon as possible after diagnosis. Immunization during chemotherapy or radiation therapy should be avoided. Routine use of PPSV23 vaccine is not recommended for American Indians, [place] Natives, or people younger than 65 years unless there are medical conditions that require PPSV23 vaccine. When indicated, people who have unknown immunization and have no record of immunization should receive PPSV23 vaccine. One-time revaccination 5 years after the first dose of PPSV23 is recommended for people aged 19-64 years who have chronic kidney failure, nephrotic syndrome, asplenia, or immunocompromised conditions. People who received 1-2 doses of PPSV23 before age 31 years should receive another dose of PPSV23 vaccine at age 27 years or later if at least 5 years have passed since the previous dose. Doses of PPSV23 are not needed for people immunized with PPSV23 at or after age 55 years.   Preventive Services / Frequency  Ages 74 years and over  Blood pressure check.  Lipid and cholesterol check.  Lung cancer screening. / Every year if you are aged 53-80 years and have a 30-pack-year history of smoking and currently smoke or have quit within the past 15 years.  Yearly screening is stopped once you have quit smoking for at least 15 years or develop a health problem that would prevent you from having lung cancer treatment.  Clinical breast exam.** / Every year after age 61 years.  BRCA-related cancer risk assessment.** / For women who have family members with a BRCA-related cancer (breast, ovarian, tubal, or peritoneal cancers).  Mammogram.** / Every year beginning at age 63 years and continuing for as long as you are in good health. Consult with your health care provider.  Pap test.** / Every 3 years starting at age 48 years through age 45 or 32 years with 3 consecutive normal Pap tests. Testing can be stopped between 65 and 70 years with 3 consecutive normal Pap tests and no abnormal Pap or HPV tests in the past 10 years.  Fecal occult blood test (FOBT) of stool. / Every year beginning at age 45 years and continuing until age 22 years. You may not need to do this test if you get a colonoscopy every 10 years.  Flexible sigmoidoscopy or colonoscopy.** / Every 5 years for a flexible sigmoidoscopy or every 10 years for a colonoscopy beginning at age 18 years and continuing until age 48 years.  Hepatitis C blood test.** / For all people born from [birth year] through 1964 and any individual with known risks for hepatitis C.  Osteoporosis screening.** / A one-time screening for women ages 63 years and over and women at risk for fractures or osteoporosis.  Skin self-exam. / Monthly.  Influenza vaccine. / Every year.  Tetanus, diphtheria, and acellular pertussis (Tdap/Td) vaccine.** / 1 dose of Td every 10 years.  Zoster vaccine.** / 1 dose for adults aged 48 years or older.  Pneumococcal 13-valent conjugate (PCV13) vaccine.** / Consult your health care provider.  Pneumococcal polysaccharide (PPSV23) vaccine.** / 1 dose for all adults aged 59 years and older. Screening  for abdominal aortic aneurysm (AAA)  by ultrasound is recommended for people who have  history of high blood pressure or who are current or former smokers.

## 2016-01-13 NOTE — Progress Notes (Signed)
Annual Screening/Preventative Visit And Comprehensive Evaluation &  Examination     This very nice 70 y.o. MBF  presents for a Wellness/Preventative Visit & comprehensive evaluation and management of multiple medical co-morbidities.  Patient has been followed for HTN, PreDiabetes, Hyperlipidemia and Vitamin D Deficiency. Patient has generally been poorly compliant to medical recommendatyions and resilient to take regularly recommended medicines. Other problems include hx/o Migraines.       HTN predates since 2012. Patient's BP has been controlled at home and patient denies any cardiac symptoms as chest pain, palpitations, shortness of breath, dizziness or ankle swelling. Today's BP: 116/74 mmHg      Patient's hyperlipidemia is not controlled with diet and medications. Patient denies myalgias or other medication SE's. Last lipids were not at goal with Cholesterol 255*; HDL 116; LDL 131*; Triglycerides 41 in Oct 2016 and she has declined taking meds for cholesterol.       Patient has prediabetes predating since 2012 with A1c 5.7%  and patient denies reactive hypoglycemic symptoms, visual blurring, diabetic polys, or paresthesias. Last A1c was  5.6% on 07/27/2015.     Patient had  I-131 RAI tx/o toxic MNG in Sept 2015  and is on replacement therapy per Dr Cruzita Lederer. Finally, patient has history of Vitamin D Deficiency of less than "10" in 2010 and last Vitamin D was still very low at "19" on 09/14/2014.  Her last  Vitamin D was "13" on 07/27/2015 reflective of her poor medial compliance.   Medication Sig  . aspirin 81 MG tablet Take 81 mg by mouth daily.    Marland Kitchen FIORICET 50-325-40  Take 1-2 tablets by mouth every 4 (four) hours as needed.   Marland Kitchen ibuprofen 400 MG tablet Take 1 tablet (400 mg total) by mouth every 6 (six) hours as needed   . levothyroxine  50 MCG Take 1 tablet (50 mcg total) by mouth daily.  Marland Kitchen loratadine  10 mg Take 10 mg by mouth daily as needed for allergies.   Marland Kitchen meclizine (ANTIVERT) 25 MG  tablet Take 1 tablet 3 x d if needed for Dizziness / Vertigo   . metoCLOPramide  10 MG tablet Take 1 tablet (10 mg total) by mouth every 6 (six) hours.  . naproxen sodium ( 220 MG tablet Take 440 mg by mouth 2 (two) times daily as needed (pain).  . ondansetron (ZOFRAN ODT 4 MG Take 1 tablet (4 mg total) by mouth every 8 (eight) hours as needed  . Pseudoephedrine-Naproxen  Take 1 tablet by mouth 2 (two) times daily as needed  . SUMAtriptan Inject subcutaneous into skin as needed for migraines    Allergies  Allergen Reactions  . PENicillin       . Pravastatin     Myalgia  . Sulfa Antibiotics Hives  . Zetia [Ezetimibe]     Myalgia   Past Medical History  Diagnosis Date  . Cataract   . Hyperlipidemia   . Migraine   . Blind right eye     legally  . Sarcoidosis, lung ([place]) 15 YRS AGO    NO TREATMENTS  . Neuropathy ([place])   . Hypertension   . Prediabetes   . Vitamin D deficiency   . Anemia   . Toxic multinodular goiter 05/27/2014   Health Maintenance  Topic Date Due  . Hepatitis C Screening  01-13-1946  . DEXA SCAN  01/07/2012  . COLONOSCOPY  03/27/2016  . INFLUENZA VACCINE  04/30/2016  . PNA vac Low Risk Adult (2 of 2 -  PPSV23) 10/15/2016  . MAMMOGRAM  05/21/2017  . TETANUS/TDAP  10/15/2021  . ZOSTAVAX  Completed   Immunization History  Administered Date(s) Administered  . Influenza Split 06/30/2013  . Influenza, High Dose Seasonal PF 06/26/2015  . Pneumococcal Conjugate-13 09/14/2014  . Pneumococcal Polysaccharide-23 10/16/2011  . Td 10/16/2011  . Zoster 01/14/2007   Past Surgical History  Procedure Laterality Date  . Eye surgery  1964    right eye  . Back surgery  1970  . Nasal septum surgery  1998  . Total abdominal hysterectomy  2000  . Knee arthroscopy  2002  . Colonoscopy  2006; 03/28/11    2 small adenomas; normal   Family History  Problem Relation Age of Onset  . Lung cancer Sister 84  . Throat cancer Brother 37  . Colon cancer Neg Hx   . COPD  Sister   . Diabetes Brother   . Stroke Mother   . Emphysema Father    Social History  Substance Use Topics  . Smoking status: Never Smoker   . Smokeless tobacco: Never Used  . Alcohol Use: No     Comment: RARE    ROS Constitutional: Denies fever, chills, weight loss/gain, headaches, insomnia,  night sweats, and change in appetite. Does c/o fatigue. Eyes: Denies redness, blurred vision, diplopia, discharge, itchy, watery eyes.  ENT: Denies discharge, congestion, post nasal drip, epistaxis, sore throat, earache, hearing loss, dental pain, Tinnitus, Vertigo, Sinus pain, snoring.  Cardio: Denies chest pain, palpitations, irregular heartbeat, syncope, dyspnea, diaphoresis, orthopnea, PND, claudication, edema Respiratory: denies cough, dyspnea, DOE, pleurisy, hoarseness, laryngitis, wheezing.  Gastrointestinal: Denies dysphagia, heartburn, reflux, water brash, pain, cramps, nausea, vomiting, bloating, diarrhea, constipation, hematemesis, melena, hematochezia, jaundice, hemorrhoids Genitourinary: Denies dysuria, frequency, urgency, nocturia, hesitancy, discharge, hematuria, flank pain Breast: Breast lumps, nipple discharge, bleeding.  Musculoskeletal: Denies arthralgia, myalgia, stiffness, Jt. Swelling, pain, limp, and strain/sprain. Denies falls. Skin: Denies puritis, rash, hives, warts, acne, eczema, changing in skin lesion Neuro: No weakness, tremor, incoordination, spasms, paresthesia, pain Psychiatric: Denies confusion, memory loss, sensory loss. Denies Depression. Endocrine: Denies change in weight, skin, hair change, nocturia, and paresthesia, diabetic polys, visual blurring, hyper / hypo glycemic episodes.  Heme/Lymph: No excessive bleeding, bruising, enlarged lymph nodes.  Physical Exam  BP 116/74 mmHg  Pulse 72  Temp(Src) 97.6 F (36.4 C)  Resp 16  Ht 5' 6.5" (1.689 m)  Wt 160 lb (72.576 kg)  BMI 25.44 kg/m2  General Appearance: Well nourished and in no apparent  distress. Eyes: PERRLA, EOMs, conjunctiva no swelling or erythema, normal fundi and vessels. Sinuses: No frontal/maxillary tenderness ENT/Mouth: EACs patent / TMs  nl. Nares clear without erythema, swelling, mucoid exudates. Oral hygiene is good. No erythema, swelling, or exudate. Tongue normal, non-obstructing. Tonsils not swollen or erythematous. Hearing normal.  Neck: Supple, thyroid normal. No bruits, nodes or JVD. Respiratory: Respiratory effort normal.  BS equal and clear bilateral without rales, rhonci, wheezing or stridor. Cardio: Heart sounds are normal with regular rate and rhythm and no murmurs, rubs or gallops. Peripheral pulses are normal and equal bilaterally without edema. No aortic or femoral bruits. Chest: symmetric with normal excursions and percussion. Breasts: Symmetric, without lumps, nipple discharge, retractions, or fibrocystic changes.  Abdomen: Flat, soft, with bowel sounds. Nontender, no guarding, rebound, hernias, masses, or organomegaly.  Lymphatics: Non tender without lymphadenopathy.  Musculoskeletal: Full ROM all peripheral extremities, joint stability, 5/5 strength, and normal gait. Skin: Warm and dry without rashes, lesions, cyanosis, clubbing or  ecchymosis.  Neuro:  Cranial nerves intact, reflexes equal bilaterally. Normal muscle tone, no cerebellar symptoms. Sensation intact.  Pysch: Alert and oriented X 3, normal affect, Insight and Judgment appropriate.   Assessment and Plan   1. Essential hypertension  - EKG 12-Lead - TSH - Microalbumin / creatinine urine ratio - EKG 12-Lead - [place], RETROPERITNL ABD,  LTD - TSH  2. Hyperlipidemia  - Microalbumin / creatinine urine ratio - EKG 12-Lead - [place], RETROPERITNL ABD,  LTD - Lipid panel - TSH - Lipid panel - TSH  3. Prediabetes  - Lipid panel - Hemoglobin A1c - Insulin, random  4. Vitamin D deficiency  - VITAMIN D 25 Hydroxy   5. Postablative hypothyroidism   6. Poor compliance with  medication   7. Screening for rectal cancer  - POC Hemoccult Bld/Stl   8. Medication management  - Urinalysis, Routine w reflex microscopic - CBC with Differential/Platelet - Hepatic function panel - Magnesium - BASIC METABOLIC PANEL WITH GFR - Hemoglobin A1c - Insulin, random - VITAMIN D 25 Hydroxy  - Urinalysis, Routine w reflex microscopic  - CBC with Differential/Platelet - BASIC METABOLIC PANEL WITH GFR - Hepatic function panel - Magnesium  9. Screening for ischemic heart disease   10. Screening for abdominal aortic aneurysm   11. Screening for AAA (aortic abdominal aneurysm)   Continue prudent diet as discussed, weight control, BP monitoring, regular exercise, and medications. Discussed med's effects and SE's. Screening labs and tests as requested with regular follow-up as recommended. Over 40 minutes of exam, counseling, chart review and high complex critical decision making was performed.

## 2016-01-14 ENCOUNTER — Encounter: Payer: Self-pay | Admitting: Internal Medicine

## 2016-01-16 ENCOUNTER — Encounter: Payer: Self-pay | Admitting: Internal Medicine

## 2016-01-16 ENCOUNTER — Ambulatory Visit (INDEPENDENT_AMBULATORY_CARE_PROVIDER_SITE_OTHER): Payer: Medicare Other | Admitting: Internal Medicine

## 2016-01-16 VITALS — BP 116/74 | HR 72 | Temp 97.6°F | Resp 16 | Ht 66.5 in | Wt 160.0 lb

## 2016-01-16 DIAGNOSIS — E782 Mixed hyperlipidemia: Secondary | ICD-10-CM

## 2016-01-16 DIAGNOSIS — Z9114 Patient's other noncompliance with medication regimen: Secondary | ICD-10-CM

## 2016-01-16 DIAGNOSIS — Z0000 Encounter for general adult medical examination without abnormal findings: Secondary | ICD-10-CM | POA: Diagnosis not present

## 2016-01-16 DIAGNOSIS — E559 Vitamin D deficiency, unspecified: Secondary | ICD-10-CM

## 2016-01-16 DIAGNOSIS — I10 Essential (primary) hypertension: Secondary | ICD-10-CM | POA: Diagnosis not present

## 2016-01-16 DIAGNOSIS — E890 Postprocedural hypothyroidism: Secondary | ICD-10-CM

## 2016-01-16 DIAGNOSIS — Z79899 Other long term (current) drug therapy: Secondary | ICD-10-CM

## 2016-01-16 DIAGNOSIS — R7303 Prediabetes: Secondary | ICD-10-CM

## 2016-01-16 DIAGNOSIS — Z0001 Encounter for general adult medical examination with abnormal findings: Secondary | ICD-10-CM

## 2016-01-16 DIAGNOSIS — Z136 Encounter for screening for cardiovascular disorders: Secondary | ICD-10-CM | POA: Diagnosis not present

## 2016-01-16 DIAGNOSIS — Z1212 Encounter for screening for malignant neoplasm of rectum: Secondary | ICD-10-CM

## 2016-01-16 LAB — BASIC METABOLIC PANEL WITH GFR
BUN: 10 mg/dL (ref 7–25)
CALCIUM: 9.1 mg/dL (ref 8.6–10.4)
CO2: 25 mmol/L (ref 20–31)
CREATININE: 0.79 mg/dL (ref 0.50–0.99)
Chloride: 107 mmol/L (ref 98–110)
GFR, Est African American: 88 mL/min (ref >=60)
GFR, Est Non African American: 77 mL/min (ref >=60)
Glucose, Bld: 80 mg/dL (ref 65–99)
Potassium: 4.4 mmol/L (ref 3.5–5.3)
SODIUM: 141 mmol/L (ref 135–146)

## 2016-01-18 ENCOUNTER — Other Ambulatory Visit: Payer: Self-pay | Admitting: Internal Medicine

## 2016-01-18 LAB — CREATININE, URINE, RANDOM: CREATININE, URINE: 183 mg/dL (ref 20–320)

## 2016-01-18 LAB — MAGNESIUM: MAGNESIUM: 2.1 mg/dL (ref 1.5–2.5)

## 2016-01-18 LAB — HEMOGLOBIN A1C
HEMOGLOBIN A1C: 5.5 % (ref <5.7)
MEAN PLASMA GLUCOSE: 111 mg/dL

## 2016-01-18 LAB — LIPID PANEL
CHOL/HDL RATIO: 2.1 ratio (ref <=5.0)
CHOLESTEROL: 238 mg/dL — AB (ref 125–200)
HDL: 114 mg/dL (ref >=46)
LDL Cholesterol: 115 mg/dL (ref <130)
TRIGLYCERIDES: 46 mg/dL (ref <150)
VLDL: 9 mg/dL (ref <30)

## 2016-01-18 LAB — TSH: TSH: 0.16 mIU/L — ABNORMAL LOW

## 2016-01-19 ENCOUNTER — Other Ambulatory Visit: Payer: Self-pay | Admitting: *Deleted

## 2016-01-19 ENCOUNTER — Other Ambulatory Visit: Payer: Self-pay | Admitting: Internal Medicine

## 2016-01-19 ENCOUNTER — Telehealth: Payer: Self-pay | Admitting: Internal Medicine

## 2016-01-19 LAB — INSULIN, RANDOM: Insulin: 3.3 u[IU]/mL (ref 2.0–19.6)

## 2016-01-19 LAB — VITAMIN D 25 HYDROXY (VIT D DEFICIENCY, FRACTURES): VIT D 25 HYDROXY: 12 ng/mL — AB (ref 30–100)

## 2016-01-19 MED ORDER — LEVOTHYROXINE SODIUM 50 MCG PO TABS: ORAL_TABLET | ORAL | Status: DC

## 2016-01-19 MED ORDER — LEVOTHYROXINE SODIUM 25 MCG PO TABS: ORAL_TABLET | ORAL | Status: DC

## 2016-01-19 NOTE — Telephone Encounter (Signed)
PT returning your call, she got your message but needs to talk to you before the end of the day.

## 2016-01-30 ENCOUNTER — Other Ambulatory Visit: Payer: Self-pay | Admitting: *Deleted

## 2016-01-30 DIAGNOSIS — Z0001 Encounter for general adult medical examination with abnormal findings: Secondary | ICD-10-CM

## 2016-01-30 DIAGNOSIS — Z1212 Encounter for screening for malignant neoplasm of rectum: Secondary | ICD-10-CM

## 2016-01-30 LAB — POC HEMOCCULT BLD/STL (HOME/3-CARD/SCREEN)
Card #2 Fecal Occult Blod, POC: NEGATIVE
Card #3 Fecal Occult Blood, POC: NEGATIVE
Fecal Occult Blood, POC: NEGATIVE

## 2016-02-20 ENCOUNTER — Ambulatory Visit (INDEPENDENT_AMBULATORY_CARE_PROVIDER_SITE_OTHER): Payer: Medicare Other | Admitting: Internal Medicine

## 2016-02-20 ENCOUNTER — Encounter: Payer: Self-pay | Admitting: Internal Medicine

## 2016-02-20 VITALS — BP 110/60 | HR 81 | Temp 98.2°F | Resp 12 | Wt 161.6 lb

## 2016-02-20 DIAGNOSIS — E890 Postprocedural hypothyroidism: Secondary | ICD-10-CM

## 2016-02-20 LAB — TSH: TSH: 1.59 u[IU]/mL (ref 0.35–4.50)

## 2016-02-20 LAB — T4, FREE: Free T4: 0.79 ng/dL (ref 0.60–1.60)

## 2016-02-20 NOTE — Progress Notes (Signed)
Patient ID: Laura Maynard, female   DOB: Mar 01, 1946, 70 y.o.   MRN: [id number]   HPI  Laura Maynard is a 70 y.o.-year-old female, returning for f/u for postablative hypothyroidism post toxic multinodular goiter (TMNG) RAI tx. Last visit 3 mo ago.  Reviewed hx: Pt had screening labs in 01/2014 and was found to have a low TSH. This was repeated 2x and the TSH continued to decrease.   Thyroid Uptake and scan (05/20/2014) >> uptake 48.5% and scan c/w TMNG.  We started MMI 5 mg bid.  Had RAI tx on 06/09/2014.   After RAI, she developed hypothyroidism >> started LT4 50 mcg daily >> TFTs normal x 2 after this, but last TSH suppressed. >> advised her to skip LT4 on Sundays >> then decreased to 25 mcg daily >> TSH high >> alternating 25 with 50 mcg every other day.  She takes the LT4: - am - fasting - with 1 glass of water - eats 1h later - no Ca, MVI, Fe, PPI  I reviewed pt's thyroid tests: Lab Results  Component Value Date   TSH 0.16* 01/16/2016   TSH 7.23* 11/09/2015   TSH 3.53 08/31/2015   TSH 0.15* 07/06/2015   TSH 0.18* 05/31/2015   TSH 3.81 04/12/2015   TSH 3.212 12/27/2014   TSH 3.75 12/14/2014   TSH 25.24* 11/08/2014   TSH 0.55 09/08/2014   FREET4 0.83 11/09/2015   FREET4 0.77 08/31/2015   FREET4 1.05 07/06/2015   FREET4 1.01 05/31/2015   FREET4 0.90 04/12/2015   FREET4 0.69 12/14/2014   FREET4 0.38* 11/08/2014   FREET4 0.71 09/08/2014   FREET4 0.88 08/08/2014   FREET4 1.79* 07/08/2014    Pt denies feeling nodules in neck, hoarseness, dysphagia/odynophagia, SOB with lying down; she c/o: - + increased appetite - + some weight gain - + fatigue - no insomnia - no weight loss - no heat intolerance - no tremors - no anxiety - + hair loss (thinning) - no dry skin - no hyperdefecation, no constipation, + diarrhea  She also has a history of HTN, HL, preDM, vit D def., anemia.  As she c/o mm and join pain >> reviewed recent vit D levels: low, at 15. She was  advised by PCP to start 10,000 units vitamin D daily >> she is not taking this.  ROS: Constitutional: see HPI Eyes: no blurry vision, no xerophthalmia ENT: no sore throat, no nodules palpated in throat, no dysphagia/odynophagia, no hoarseness Cardiovascular: no CP/SOB/palpitations/leg swelling Respiratory: occasional cough/no SOB Gastrointestinal: no N/V/D/C/heartburn Musculoskeletal: no muscle aches/joint aches Skin: no rashes,  + hair loss  Neurological: no tremors/numbness/tingling/dizziness  I reviewed pt's medications, allergies, PMH, social hx, family hx, and changes were documented in the history of present illness. Otherwise, unchanged from my initial visit note.  PE: BP 110/60 mmHg  Pulse 81  Temp(Src) 98.2 F (36.8 C) (Oral)  Resp 12  Wt 161 lb 9.6 oz (73.301 kg)  SpO2 97% Body mass index is 25.7 kg/(m^2). Wt Readings from Last 3 Encounters:  02/20/16 161 lb 9.6 oz (73.301 kg)  01/16/16 160 lb (72.576 kg)  09/06/15 158 lb (71.668 kg)   Constitutional: normal weight, in NAD Eyes: surgical pupil R eye, EOMI ENT: moist mucous membranes, no thyromegaly, no cervical lymphadenopathy Cardiovascular: RRR, no MRG, no LE edema Respiratory: CTA B Gastrointestinal: abdomen soft, NT, ND, BS+ Musculoskeletal: no deformities, strength intact in all 4 Skin: moist, warm, no rashes Neurological: no tremor with outstretched hands, DTR normal in  all 4  ASSESSMENT: 1. Hypothyroidism after RAI tx   2. H/o Toxic MNG  3. Vit D def  PLAN:  1. And 2. Patient with h/o thyrotoxicosis 2/2 TMNG, previously with some thyrotoxic sxs: weight loss, anxiety, insomnia; now all resolved after the RAI tx. She developped hypothyroidism after RAI tx >> LT4 25 mcg daily, but TSH increased >> now alternating 25 with 50 mcg every other day. - We discussed that her thyroid is still functioning after her radioactive iodine treatment, however, not that 100%. That's why, she needs a low dose of exogenous  thyroid hormone - We discussed about possibly switching to brand name Synthroid, but I do not feel that she needs to do this quite now, since this could be more expensive and we do not have inconsistencies between tests, I feel that we did not find the best replacement dose for her yet -  we discussed how to take the LT4 correctly. She will need to take the thyroid hormone every day, with water, >30 minutes before breakfast, separated by >4 hours from acid reflux medications, calcium, iron, multivitamins. She is taking it correctly - will check the TSH, fT4 today  - RTC in 6 months  3. Vit D def - followed by PCP  Office Visit on 02/20/2016  Component Date Value Ref Range Status  . Free T4 02/20/2016 0.79  0.60 - 1.60 ng/dL Final  . TSH 02/20/2016 1.59  0.35 - 4.50 uIU/mL Final   Excellent TFTs. Continue to alternate 25 with 50 g levothyroxine every other day. I will recheck her tests when she returns in 6 months.

## 2016-02-20 NOTE — Patient Instructions (Signed)
Please continue alternating 25 with 50 mcg Levothyroxine every other day.  Take the thyroid hormone every day, with water, at least 30 minutes before breakfast, separated by at least 4 hours from: - acid reflux medications - calcium - iron - multivitamins  Please stop at the lab.  Please come back for a follow-up appointment in 6 months.

## 2016-03-25 ENCOUNTER — Telehealth: Payer: Self-pay | Admitting: Internal Medicine

## 2016-03-25 NOTE — Telephone Encounter (Signed)
I would doubt her sxs are from LT4... Please advise her to stay well hydrated, start a supplement of magnesium 400 or 500 mg daily. Also, is she taking vitamin D? Maybe she needs a supplement... If she continues to have this, and she strongly suspect this is from her Levothyroxine, take only the lower dose for 2 weeks, then increase back and see how she feels.

## 2016-03-25 NOTE — Telephone Encounter (Signed)
Patient stated she is having leg cramps and joint pain she think it is the medication levothyroxine, 25 on day 50 on the next. Please advise

## 2016-03-25 NOTE — Telephone Encounter (Signed)
I contacted the pt and advsied of the note below. Pt stated she not taking a vit d supplement at time. Pt verbalized understanding on starting the magnesium and medication instructions.

## 2016-04-22 ENCOUNTER — Ambulatory Visit (INDEPENDENT_AMBULATORY_CARE_PROVIDER_SITE_OTHER): Payer: Medicare Other | Admitting: Internal Medicine

## 2016-04-22 ENCOUNTER — Encounter: Payer: Self-pay | Admitting: Internal Medicine

## 2016-04-22 VITALS — BP 120/66 | HR 82 | Temp 98.0°F | Resp 16 | Ht 66.5 in | Wt 158.0 lb

## 2016-04-22 DIAGNOSIS — E782 Mixed hyperlipidemia: Secondary | ICD-10-CM

## 2016-04-22 DIAGNOSIS — E559 Vitamin D deficiency, unspecified: Secondary | ICD-10-CM | POA: Diagnosis not present

## 2016-04-22 DIAGNOSIS — Z79899 Other long term (current) drug therapy: Secondary | ICD-10-CM

## 2016-04-22 DIAGNOSIS — I10 Essential (primary) hypertension: Secondary | ICD-10-CM

## 2016-04-22 DIAGNOSIS — E890 Postprocedural hypothyroidism: Secondary | ICD-10-CM | POA: Diagnosis not present

## 2016-04-22 DIAGNOSIS — R7303 Prediabetes: Secondary | ICD-10-CM | POA: Diagnosis not present

## 2016-04-22 LAB — CBC WITH DIFFERENTIAL/PLATELET
BASOS ABS: 54 {cells}/uL (ref 0–200)
Basophils Relative: 1 %
EOS ABS: 54 {cells}/uL (ref 15–500)
Eosinophils Relative: 1 %
HEMATOCRIT: 36.2 % (ref 35.0–45.0)
HEMOGLOBIN: 12 g/dL (ref 11.7–15.5)
LYMPHS ABS: 1512 {cells}/uL (ref 850–3900)
Lymphocytes Relative: 28 %
MCH: 28 pg (ref 27.0–33.0)
MCHC: 33.1 g/dL (ref 32.0–36.0)
MCV: 84.6 fL (ref 80.0–100.0)
MONO ABS: 540 {cells}/uL (ref 200–950)
MPV: 8.9 fL (ref 7.5–12.5)
Monocytes Relative: 10 %
NEUTROS ABS: 3240 {cells}/uL (ref 1500–7800)
Neutrophils Relative %: 60 %
Platelets: 332 10*3/uL (ref 140–400)
RBC: 4.28 MIL/uL (ref 3.80–5.10)
RDW: 14.7 % (ref 11.0–15.0)
WBC: 5.4 10*3/uL (ref 3.8–10.8)

## 2016-04-22 LAB — HEPATIC FUNCTION PANEL
ALT: 12 U/L (ref 6–29)
AST: 17 U/L (ref 10–35)
Albumin: 4.3 g/dL (ref 3.6–5.1)
Alkaline Phosphatase: 85 U/L (ref 33–130)
BILIRUBIN DIRECT: 0.1 mg/dL (ref <=0.2)
Indirect Bilirubin: 0.4 mg/dL (ref 0.2–1.2)
TOTAL PROTEIN: 6.4 g/dL (ref 6.1–8.1)
Total Bilirubin: 0.5 mg/dL (ref 0.2–1.2)

## 2016-04-22 LAB — BASIC METABOLIC PANEL WITH GFR
BUN: 12 mg/dL (ref 7–25)
CHLORIDE: 104 mmol/L (ref 98–110)
CO2: 28 mmol/L (ref 20–31)
CREATININE: 0.73 mg/dL (ref 0.50–0.99)
Calcium: 9.5 mg/dL (ref 8.6–10.4)
GFR, Est African American: >89 mL/min (ref >=60)
GFR, Est Non African American: 84 mL/min (ref >=60)
GLUCOSE: 84 mg/dL (ref 65–99)
Potassium: 4.2 mmol/L (ref 3.5–5.3)
SODIUM: 140 mmol/L (ref 135–146)

## 2016-04-22 LAB — TSH: TSH: 3.46 mIU/L

## 2016-04-22 LAB — LIPID PANEL
CHOLESTEROL: 288 mg/dL — AB (ref 125–200)
HDL: 136 mg/dL (ref >=46)
LDL CALC: 140 mg/dL — AB (ref <130)
Total CHOL/HDL Ratio: 2.1 Ratio (ref <=5.0)
Triglycerides: 62 mg/dL (ref <150)
VLDL: 12 mg/dL (ref <30)

## 2016-04-22 LAB — HEMOGLOBIN A1C
HEMOGLOBIN A1C: 5.5 % (ref <5.7)
MEAN PLASMA GLUCOSE: 111 mg/dL

## 2016-04-22 MED ORDER — BUTALBITAL-APAP-CAFFEINE 50-325-40 MG PO TABS: 1.0000 | ORAL_TABLET | ORAL | 0 refills | Status: DC | PRN

## 2016-04-22 NOTE — Progress Notes (Signed)
Assessment and Plan:  Hypertension:  -Continue medication,  -monitor blood pressure at home.  -Continue DASH diet.   -Reminder to go to the ER if any CP, SOB, nausea, dizziness, severe HA, changes vision/speech, left arm numbness and tingling, and jaw pain.  Cholesterol: -Continue diet and exercise.  -Check cholesterol.   Pre-diabetes: -Continue diet and exercise.  -Check A1C  Vitamin D Def: -check level -continue medications.   Hypothyroidism -TSH -adjust levothyroxine  Continue diet and meds as discussed. Further disposition pending results of labs.  HPI 70 y.o. female  presents for 3 month follow up with hypertension, hyperlipidemia, prediabetes and vitamin D.   Her blood pressure has been controlled at home, today their BP is BP: 120/66.   She does not workout. She denies chest pain, shortness of breath, dizziness.   She is on cholesterol medication and denies myalgias. Her cholesterol is at goal. The cholesterol last visit was:   Lab Results  Component Value Date   CHOL 238 (H) 01/16/2016   HDL 114 01/16/2016   LDLCALC 115 01/16/2016   TRIG 46 01/16/2016   CHOLHDL 2.1 01/16/2016     She has been working on diet and exercise for prediabetes, and denies foot ulcerations, hyperglycemia, hypoglycemia , increased appetite, nausea, paresthesia of the feet, polydipsia, polyuria, visual disturbances, vomiting and weight loss. Last A1C in the office was:  Lab Results  Component Value Date   HGBA1C 5.5 01/16/2016    Patient is on Vitamin D supplement.  Lab Results  Component Value Date   VD25OH 12 (L) 01/16/2016     She reports that she is concerned that she is having a some irritability and is also having some leg pains and trouble sleeping.  She reports that she did decrease her dose per the instructions of Dr. Renne Crigler.  She reports that she felt better on a smaller dose.     Current Medications:  Current Outpatient Prescriptions on File Prior to Visit  Medication  Sig Dispense Refill  . aspirin 81 MG tablet Take 81 mg by mouth daily.      . butalbital-acetaminophen-caffeine (FIORICET, ESGIC) 50-325-40 MG tablet Take 1-2 tablets by mouth every 4 (four) hours as needed. Migraines/headaches  0  . levothyroxine (LEVOTHROID) 25 MCG tablet Take 1 tablet every other day, alternating with the 50 mcg. 30 tablet 1  . levothyroxine (SYNTHROID, LEVOTHROID) 50 MCG tablet Take 1 tablet every other day, alternating with 25 mcg. 30 tablet 1  . loratadine (CLARITIN) 10 MG tablet Take 10 mg by mouth daily as needed for allergies.   2  . meclizine (ANTIVERT) 25 MG tablet Take 1 tablet 3 x d if needed for Dizziness / Vertigo (Patient taking differently: Take 25 mg by mouth 3 (three) times daily as needed for dizziness. Take 1 tablet 3 x d if needed for Dizziness / Vertigo) 100 tablet 999  . naproxen sodium (ANAPROX) 220 MG tablet Take 440 mg by mouth 2 (two) times daily as needed (pain).    . Pseudoephedrine-Naproxen Na (ALEVE-D SINUS & COLD PO) Take 1 tablet by mouth 2 (two) times daily as needed (cough/cold symptoms). PRN    . SUMAtriptan Succinate 6 MG/0.5ML SOTJ Inject subcutaneous into skin as needed for migraines 6 Device 0  . [DISCONTINUED] amitriptyline (ELAVIL) 25 MG tablet 1-2 at night for headache (Patient not taking: Reported on 11/07/2015) 60 tablet 0   No current facility-administered medications on file prior to visit.     Medical History:  Past Medical  History:  Diagnosis Date  . Anemia   . Blind right eye    legally  . Cataract   . Hyperlipidemia   . Hypertension   . Migraine   . Neuropathy ([place])   . Prediabetes   . Sarcoidosis, lung ([place]) 15 YRS AGO   NO TREATMENTS  . 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 Vitamin D deficiency     Allergies:  Allergies  Allergen Reactions  . Penicillins Hives  . Pravastatin     Myalgia  . Sulfa Antibiotics Hives  . Zetia [Ezetimibe]     Myalgia     Review of Systems:  Review of Systems   Constitutional: Negative for chills, fever and malaise/fatigue.  HENT: Negative for congestion, ear pain and sore throat.   Eyes: Negative.   Respiratory: Negative for cough, shortness of breath and wheezing.   Cardiovascular: Positive for palpitations. Negative for chest pain and leg swelling.  Gastrointestinal: Positive for diarrhea. Negative for abdominal pain, blood in stool, constipation, heartburn and melena.  Genitourinary: Negative.   Skin: Negative.   Neurological: Negative for dizziness, sensory change, loss of consciousness and headaches.  Psychiatric/Behavioral: Negative for depression. The patient is nervous/anxious. The patient does not have insomnia.     Family history- Review and unchanged  Social history- Review and unchanged  Physical Exam: BP 120/66 (BP Location: Left Arm, Patient Position: Sitting, Cuff Size: Large)   Pulse 82   Temp 98 F (36.7 C) (Temporal)   Resp 16   Ht 5' 6.5" (1.689 m)   Wt 158 lb (71.7 kg)   BMI 25.12 kg/m  Wt Readings from Last 3 Encounters:  04/22/16 158 lb (71.7 kg)  02/20/16 161 lb 9.6 oz (73.3 kg)  01/16/16 160 lb (72.6 kg)    General Appearance: Well nourished well developed, in no apparent distress. Eyes: PERRLA, EOMs, conjunctiva no swelling or erythema ENT/Mouth: Ear canals normal without obstruction, swelling, erythma, discharge.  TMs normal bilaterally.  Oropharynx moist, clear, without exudate, or postoropharyngeal swelling. Neck: Supple, thyroid normal,no cervical adenopathy  Respiratory: Respiratory effort normal, Breath sounds clear A&P without rhonchi, wheeze, or rale.  No retractions, no accessory usage. Cardio: RRR with no MRGs. Brisk peripheral pulses without edema.  Abdomen: Soft, + BS,  Non tender, no guarding, rebound, hernias, masses. Musculoskeletal: Full ROM, 5/5 strength, Normal gait Skin: Warm, dry without rashes, lesions, ecchymosis.  Neuro: Awake and oriented X 3, Cranial nerves intact. Normal muscle  tone, no cerebellar symptoms. Psych: Normal affect, Insight and Judgment appropriate.    Starlyn Skeans, PA-C 8:59 AM [facility]

## 2016-04-23 ENCOUNTER — Encounter: Payer: Self-pay | Admitting: Internal Medicine

## 2016-04-24 ENCOUNTER — Other Ambulatory Visit: Payer: Self-pay | Admitting: *Deleted

## 2016-05-22 ENCOUNTER — Telehealth: Payer: Self-pay | Admitting: Internal Medicine

## 2016-05-22 NOTE — Telephone Encounter (Signed)
Patient stated that she is not any better with the altering of he medication levothyroxine.  Please advise

## 2016-05-23 ENCOUNTER — Telehealth: Payer: Self-pay

## 2016-05-23 NOTE — Telephone Encounter (Signed)
She is coming for an appointment tomorrow.

## 2016-05-23 NOTE — Telephone Encounter (Signed)
Attempted to call patient and notify her that Dr.Gherghe would see her tomorrow at her appointment, rang and no answer, when I called twice.

## 2016-05-24 ENCOUNTER — Ambulatory Visit (INDEPENDENT_AMBULATORY_CARE_PROVIDER_SITE_OTHER): Payer: Medicare Other | Admitting: Internal Medicine

## 2016-05-24 ENCOUNTER — Encounter: Payer: Self-pay | Admitting: Internal Medicine

## 2016-05-24 VITALS — BP 108/70 | HR 80 | Wt 157.0 lb

## 2016-05-24 DIAGNOSIS — E890 Postprocedural hypothyroidism: Secondary | ICD-10-CM

## 2016-05-24 NOTE — Patient Instructions (Signed)
Please stay off Levothyroxine for now.  Come back in 1 month for labs.  Please come back for a follow-up appointment in 3 months.

## 2016-05-24 NOTE — Progress Notes (Signed)
Patient ID: IEVA MUCH, female   DOB: [birth year], 70 y.o.   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 Last visit 3 mo ago.  She developed leg cramps few mo ago.  She stopped Levothyroxine 1 week ago >> pain better.  Reviewed hx: Pt had screening labs in 01/2014 and was found to have a low TSH. This was repeated 2x and the TSH continued to decrease.   Thyroid Uptake and scan (05/20/2014) >> uptake 48.5% and scan c/w TMNG.  We started MMI 5 mg bid.  Had RAI tx on 06/09/2014.   After RAI, she developed hypothyroidism >> started LT4 50 mcg daily >> then alternating 25 with 50 mcg every other day >> stopped 1 week ago.  She was taking the LT4: - am - fasting - with 1 glass of water - eats 1h later - no Ca, MVI, Fe, PPI  I reviewed pt's thyroid tests: Lab Results  Component Value Date   TSH 3.46 04/22/2016   TSH 1.59 02/20/2016   TSH 0.16 (L) 01/16/2016   TSH 7.23 (H) 11/09/2015   TSH 3.53 08/31/2015   TSH 0.15 (L) 07/06/2015   TSH 0.18 (L) 05/31/2015   TSH 3.81 04/12/2015   TSH 3.212 12/27/2014   TSH 3.75 12/14/2014   FREET4 0.79 02/20/2016   FREET4 0.83 11/09/2015   FREET4 0.77 08/31/2015   FREET4 1.05 07/06/2015   FREET4 1.01 05/31/2015   FREET4 0.90 04/12/2015   FREET4 0.69 12/14/2014   FREET4 0.38 (L) 11/08/2014   FREET4 0.71 09/08/2014   FREET4 0.88 08/08/2014    Pt denies feeling nodules in neck, hoarseness, dysphagia/odynophagia, SOB with lying down; she c/o: - leg cramps >> improving - no weight gain or loss - no fatigue - no insomnia - no weight loss - no heat intolerance - no tremors - no anxiety - no hair loss  - no dry skin - no hyperdefecation, no constipation, no diarrhea  She also has a history of HTN, HL, preDM, vit D def., anemia.  As she c/o mm and join pain >> reviewed recent vit D levels: low, at 15. She was advised  by PCP to start 10,000 units vitamin D daily >> she is not taking this.  ROS: Constitutional: see HPI, + nocturia Eyes: no blurry vision, no xerophthalmia ENT: no sore throat, no nodules palpated in throat, no dysphagia/odynophagia, no hoarseness Cardiovascular: no CP/SOB/palpitations/leg swelling Respiratory: occasional cough/no SOB Gastrointestinal: no N/V/D/C/heartburn Musculoskeletal: + muscle aches/+ joint aches Skin: no rashes,  + hair loss  Neurological: no tremors/numbness/tingling/dizziness  I reviewed pt's medications, allergies, PMH, social hx, family hx, and changes were documented in the history of present illness. Otherwise, unchanged from my initial visit note.  PE: BP 108/70 (BP Location: Left Arm, Patient Position: Sitting)   Pulse 80   Wt 157 lb (71.2 kg)   SpO2 98%   BMI 24.96 kg/m  Body mass index is 24.96 kg/m. Wt Readings from Last 3 Encounters:  05/24/16 157 lb (71.2 kg)  04/22/16 158 lb (71.7 kg)  02/20/16 161 lb 9.6 oz (73.3 kg)   Constitutional: normal weight, in NAD Eyes: surgical pupil R eye, EOMI ENT: moist mucous membranes, no thyromegaly, no cervical lymphadenopathy Cardiovascular: RRR, no MRG, no LE edema Respiratory: CTA B Gastrointestinal: abdomen soft, NT, ND, BS+ Musculoskeletal: no deformities, strength intact in all 4 Skin: moist, warm, no rashes  Neurological: no tremor with outstretched hands, DTR normal in all 4  ASSESSMENT: 1. Hypothyroidism after RAI tx   2. H/o Toxic MNG  PLAN:  1. And 2. Patient with h/o thyrotoxicosis 2/2 TMNG, previously with some thyrotoxic sxs: weight loss, anxiety, insomnia; now all resolved after the RAI tx. She developped hypothyroidism after RAI tx >> LT4 25 mcg daily, but TSH increased >> then alternating 25 with 50 mcg every other day.  She scheduled this appointment because she feels poorly on the levothyroxine >> severe leg cramps >> she actually stopped LT4 1 week ago. We discussed to continue to  stay off for 1 more month and then recheck her TFTs to see if we absolutely need to restart. If we do, we can try 25 mcg of Tirosint. - We discussed that her thyroid is still functioning after her radioactive iodine treatment, however, not that 100%. That's why, she may need a low dose of exogenous thyroid hormone. -  we discussed how to take the LT4 correctly. She will need to take the thyroid hormone every day, with water, >30 minutes before breakfast, separated by >4 hours from acid reflux medications, calcium, iron, multivitamins. She was taking it correctly - reviewed latest TSH, fT4 from last month >> normal - RTC in 3 months, but in 1 mo for labs.  Orders Placed This Encounter  Procedures  . T4, free  . T3, free  . TSH   Philemon Kingdom, MD PhD [facility]

## 2016-05-27 ENCOUNTER — Ambulatory Visit (INDEPENDENT_AMBULATORY_CARE_PROVIDER_SITE_OTHER): Payer: Medicare Other | Admitting: Internal Medicine

## 2016-05-27 ENCOUNTER — Encounter: Payer: Self-pay | Admitting: Internal Medicine

## 2016-05-27 VITALS — BP 122/78 | HR 80 | Temp 97.8°F | Resp 16 | Ht 66.5 in

## 2016-05-27 DIAGNOSIS — M5416 Radiculopathy, lumbar region: Secondary | ICD-10-CM

## 2016-05-27 DIAGNOSIS — R252 Cramp and spasm: Secondary | ICD-10-CM

## 2016-05-27 LAB — BASIC METABOLIC PANEL
BUN: 10 mg/dL (ref 7–25)
CHLORIDE: 106 mmol/L (ref 98–110)
CO2: 25 mmol/L (ref 20–31)
CREATININE: 0.81 mg/dL (ref 0.50–0.99)
Calcium: 9.6 mg/dL (ref 8.6–10.4)
Glucose, Bld: 86 mg/dL (ref 65–99)
Potassium: 4.5 mmol/L (ref 3.5–5.3)
Sodium: 142 mmol/L (ref 135–146)

## 2016-05-27 LAB — MAGNESIUM: Magnesium: 2.3 mg/dL (ref 1.5–2.5)

## 2016-05-27 MED ORDER — PREDNISONE 20 MG PO TABS: ORAL_TABLET | ORAL | 0 refills | Status: DC

## 2016-05-27 NOTE — Progress Notes (Signed)
Subjective:    Patient ID: Laura Maynard, female    DOB: [birth year], 70 y.o.   MRN: [id number]  HPI  Patient presents to the office for evaluation of bilateral leg pain and leg cramping.  This has been going on for the past 1 month.  She reports that she is having some numbness and tingling in her feet.  She reports that the last time she felt like this was when she was taking cholesterol medications.  It is worse at nighttime.  She reports that she has not tried anything thus far for the pain in her legs.  She does not take anything on a regular basis.  She has tried taking aleve D.  She reports that the aleve did not give her any relief.  She has been having low back pain.  She reports that the right leg is much worse than the left leg.  She reports that the pain is very nagging and uncomfortable.     Review of Systems  Constitutional: Negative for chills, fatigue and fever.  Genitourinary: Negative for dysuria, frequency, hematuria and urgency.  Musculoskeletal: Positive for back pain and myalgias.  Neurological: Positive for numbness. Negative for weakness.       Objective:   Physical Exam  Constitutional: She is oriented to person, place, and time. She appears well-developed and well-nourished. No distress.  HENT:  Head: Normocephalic.  Mouth/Throat: Oropharynx is clear and moist. No oropharyngeal exudate.  Eyes: Conjunctivae are normal. No scleral icterus.  Neck: Normal range of motion. Neck supple. No JVD present. No thyromegaly present.  Cardiovascular: Normal rate, regular rhythm, normal heart sounds and intact distal pulses.  Exam reveals no gallop and no friction rub.   No murmur heard. Pulmonary/Chest: Effort normal and breath sounds normal. No respiratory distress. She has no wheezes. She has no rales. She exhibits no tenderness.  Abdominal: Soft. Bowel sounds are normal. She exhibits no distension and no mass. There is no tenderness. There is no rebound and no guarding.   Musculoskeletal:  Patient rises slowly from sitting to standing.  They walk without an antalgic gait.  There is no evidence of erythema, ecchymosis, or gross deformity.  There is tenderness to palpation over lumbar bony spine.  Active ROM is full and pain free in the back but forward bending does cause some pain in the right leg.  Sensation to light touch is intact over all extremities.  Strength is symmetric and equal in all extremities.  Negative straight leg raise bilaterally.    Normal bilateral knee and hip exams.     Lymphadenopathy:    She has no cervical adenopathy.  Neurological: She is alert and oriented to person, place, and time.  Skin: Skin is warm and dry. She is not diaphoretic.  Psychiatric: She has a normal mood and affect. Her behavior is normal. Judgment and thought content normal.  Nursing note and vitals reviewed.   Vitals:   05/27/16 0938  BP: 122/78  Pulse: 80  Resp: 16  Temp: 97.8 F (36.6 C)          Assessment & Plan:   1. Muscle cramps -rule out electrolyte - Basic metabolic panel - Magnesium  2. Lumbar radiculopathy -lyrica samples given -recommended taking 500 mg of magnesium twice daily -if no improvement consider imaging of the lumbar spine and referral to ortho -no red flag symptoms for cauda equina - predniSONE (DELTASONE) 20 MG tablet; 3 tabs po daily x 3 days, then 2 tabs  x 3 days, then 1.5 tabs x 3 days, then 1 tab x 3 days, then 0.5 tabs x 3 days  Dispense: 27 tablet; Refill: 0

## 2016-05-27 NOTE — Patient Instructions (Signed)
Radicular Pain Radicular pain in either the arm or leg is usually from a bulging or herniated disk in the spine. A piece of the herniated disk may press against the nerves as the nerves exit the spine. This causes pain which is felt at the tips of the nerves down the arm or leg. Other causes of radicular pain may include:  Fractures.  Heart disease.  Cancer.  An abnormal and usually degenerative state of the nervous system or nerves (neuropathy). Diagnosis may require CT or MRI scanning to determine the primary cause.  Nerves that start at the neck (nerve roots) may cause radicular pain in the outer shoulder and arm. It can spread down to the thumb and fingers. The symptoms vary depending on which nerve root has been affected. In most cases radicular pain improves with conservative treatment. Neck problems may require physical therapy, a neck collar, or cervical traction. Treatment may take many weeks, and surgery may be considered if the symptoms do not improve.  Conservative treatment is also recommended for sciatica. Sciatica causes pain to radiate from the lower back or buttock area down the leg into the foot. Often there is a history of back problems. Most patients with sciatica are better after 2 to 4 weeks of rest and other supportive care. Short term bed rest can reduce the disk pressure considerably. Sitting, however, is not a good position since this increases the pressure on the disk. You should avoid bending, lifting, and all other activities which make the problem worse. Traction can be used in severe cases. Surgery is usually reserved for patients who do not improve within the first months of treatment. Only take over-the-counter or prescription medicines for pain, discomfort, or fever as directed by your caregiver. Narcotics and muscle relaxants may help by relieving more severe pain and spasm and by providing mild sedation. Cold or massage can give significant relief. Spinal manipulation  is not recommended. It can increase the degree of disc protrusion. Epidural steroid injections are often effective treatment for radicular pain. These injections deliver medicine to the spinal nerve in the space between the protective covering of the spinal cord and back bones (vertebrae). Your caregiver can give you more information about steroid injections. These injections are most effective when given within two weeks of the onset of pain.  You should see your caregiver for follow up care as recommended. A program for neck and back injury rehabilitation with stretching and strengthening exercises is an important part of management.  SEEK IMMEDIATE MEDICAL CARE IF:  You develop increased pain, weakness, or numbness in your arm or leg.  You develop difficulty with bladder or bowel control.  You develop abdominal pain.   This information is not intended to replace advice given to you by your health care provider. Make sure you discuss any questions you have with your health care provider.   Document Released: 10/24/2004 Document Revised: 10/07/2014 Document Reviewed: 04/12/2015 Elsevier Interactive Patient Education 2016 [employer]  Pregabalin capsules What is this medicine? PREGABALIN (pre GAB a lin) is used to treat nerve pain from diabetes, shingles, spinal cord injury, and fibromyalgia. It is also used to control seizures in epilepsy. This medicine may be used for other purposes; ask your health care provider or pharmacist if you have questions. What should I tell my health care provider before I take this medicine? They need to know if you have any of these conditions: -bleeding problems -heart disease, including heart failure -history of alcohol or drug  abuse -kidney disease -suicidal thoughts, plans, or attempt; a previous suicide attempt by you or a family member -an unusual or allergic reaction to pregabalin, gabapentin, other medicines, foods, dyes, or  preservatives -pregnant or trying to get pregnant or trying to conceive with your partner -breast-feeding How should I use this medicine? Take this medicine by mouth with a glass of water. Follow the directions on the prescription label. You can take this medicine with or without food. Take your doses at regular intervals. Do not take your medicine more often than directed. Do not stop taking except on your doctor's advice. A special MedGuide will be given to you by the pharmacist with each prescription and refill. Be sure to read this information carefully each time. Talk to your pediatrician regarding the use of this medicine in children. Special care may be needed. Overdosage: If you think you have taken too much of this medicine contact a poison control center or emergency room at once. NOTE: This medicine is only for you. Do not share this medicine with others. What if I miss a dose? If you miss a dose, take it as soon as you can. If it is almost time for your next dose, take only that dose. Do not take double or extra doses. What may interact with this medicine? -alcohol -certain medicines for blood pressure like captopril, enalapril, or lisinopril -certain medicines for diabetes, like pioglitazone or rosiglitazone -certain medicines for anxiety or sleep -narcotic medicines for pain This list may not describe all possible interactions. Give your health care provider a list of all the medicines, herbs, non-prescription drugs, or dietary supplements you use. Also tell them if you smoke, drink alcohol, or use illegal drugs. Some items may interact with your medicine. What should I watch for while using this medicine? Tell your doctor or healthcare professional if your symptoms do not start to get better or if they get worse. Visit your doctor or health care professional for regular checks on your progress. Do not stop taking except on your doctor's advice. You may develop a severe reaction.  Your doctor will tell you how much medicine to take. Wear a medical identification bracelet or chain if you are taking this medicine for seizures, and carry a card that describes your disease and details of your medicine and dosage times. You may get drowsy or dizzy. Do not drive, use machinery, or do anything that needs mental alertness until you know how this medicine affects you. Do not stand or sit up quickly, especially if you are an older patient. This reduces the risk of dizzy or fainting spells. Alcohol may interfere with the effect of this medicine. Avoid alcoholic drinks. If you have a heart condition, like congestive heart failure, and notice that you are retaining water and have swelling in your hands or feet, contact your health care provider immediately. The use of this medicine may increase the chance of suicidal thoughts or actions. Pay special attention to how you are responding while on this medicine. Any worsening of mood, or thoughts of suicide or dying should be reported to your health care professional right away. This medicine has caused reduced sperm counts in some men. This may interfere with the ability to father a child. You should talk to your doctor or health care professional if you are concerned about your fertility. Women who become pregnant while using this medicine for seizures may enroll in the [place] Pregnancy Registry by calling [phone]. This registry collects  information about the safety of antiepileptic drug use during pregnancy. What side effects may I notice from receiving this medicine? Side effects that you should report to your doctor or health care professional as soon as possible: -allergic reactions like skin rash, itching or hives, swelling of the face, lips, or tongue -breathing problems -changes in vision -chest pain -confusion -jerking or unusual movements of any part of your body -loss of memory -muscle pain,  tenderness, or weakness -suicidal thoughts or other mood changes -swelling of the ankles, feet, hands -unusual bruising or bleeding Side effects that usually do not require medical attention (Report these to your doctor or health care professional if they continue or are bothersome.): -dizziness -drowsiness -dry mouth -headache -nausea -tremors -trouble sleeping -weight gain This list may not describe all possible side effects. Call your doctor for medical advice about side effects. You may report side effects to FDA at 1-800-FDA-1088. Where should I keep my medicine? Keep out of the reach of children. This medicine can be abused. Keep your medicine in a safe place to protect it from theft. Do not share this medicine with anyone. Selling or giving away this medicine is dangerous and against the law. This medicine may cause accidental overdose and death if it taken by other adults, children, or pets. Mix any unused medicine with a substance like cat litter or coffee grounds. Then throw the medicine away in a sealed container like a sealed bag or a coffee can with a lid. Do not use the medicine after the expiration date. Store at room temperature between 15 and 30 degrees C (59 and 86 degrees F). NOTE: This sheet is a summary. It may not cover all possible information. If you have questions about this medicine, talk to your doctor, pharmacist, or health care provider.    2016, Elsevier/Gold Standard. (2013-11-12 15:38:53)

## 2016-06-26 ENCOUNTER — Other Ambulatory Visit: Payer: Self-pay | Admitting: Internal Medicine

## 2016-06-26 ENCOUNTER — Telehealth: Payer: Self-pay

## 2016-06-26 ENCOUNTER — Other Ambulatory Visit (INDEPENDENT_AMBULATORY_CARE_PROVIDER_SITE_OTHER): Payer: Medicare Other

## 2016-06-26 DIAGNOSIS — E890 Postprocedural hypothyroidism: Secondary | ICD-10-CM

## 2016-06-26 LAB — TSH: TSH: 24.92 u[IU]/mL — ABNORMAL HIGH (ref 0.35–4.50)

## 2016-06-26 LAB — T3, FREE: T3 FREE: 2.3 pg/mL (ref 2.3–4.2)

## 2016-06-26 LAB — T4, FREE: FREE T4: 0.44 ng/dL — AB (ref 0.60–1.60)

## 2016-06-26 MED ORDER — TIROSINT 25 MCG PO CAPS: ORAL_CAPSULE | ORAL | 2 refills | Status: DC

## 2016-06-26 NOTE — Telephone Encounter (Signed)
Called patient and gave lab results. Patient had no questions or concerns. And scheduled patient for lab appointment.

## 2016-06-28 ENCOUNTER — Telehealth: Payer: Self-pay | Admitting: Internal Medicine

## 2016-06-28 NOTE — Telephone Encounter (Signed)
I contacted the patient and advised of MD's message. Patient voiced understanding.

## 2016-06-28 NOTE — Telephone Encounter (Signed)
She can use any LT4 25 mcg.

## 2016-06-28 NOTE — Telephone Encounter (Signed)
Please advise on how to proceed. Thanks 

## 2016-06-28 NOTE — Telephone Encounter (Signed)
Pt is calling and requesting a call back from [place] today,  Please advise

## 2016-06-28 NOTE — Telephone Encounter (Signed)
Tirosint is too expensive please let her know what can be done, please advise Pt does still have levothyroxine at the house still

## 2016-06-28 NOTE — Telephone Encounter (Signed)
See message and please advise, Thanks!  

## 2016-07-25 ENCOUNTER — Ambulatory Visit (INDEPENDENT_AMBULATORY_CARE_PROVIDER_SITE_OTHER): Payer: Medicare Other | Admitting: Internal Medicine

## 2016-07-25 ENCOUNTER — Encounter: Payer: Self-pay | Admitting: Internal Medicine

## 2016-07-25 VITALS — BP 112/70 | HR 80 | Temp 98.2°F | Resp 16 | Ht 66.5 in | Wt 165.0 lb

## 2016-07-25 DIAGNOSIS — R6889 Other general symptoms and signs: Secondary | ICD-10-CM

## 2016-07-25 DIAGNOSIS — I10 Essential (primary) hypertension: Secondary | ICD-10-CM | POA: Diagnosis not present

## 2016-07-25 DIAGNOSIS — R7303 Prediabetes: Secondary | ICD-10-CM

## 2016-07-25 DIAGNOSIS — E782 Mixed hyperlipidemia: Secondary | ICD-10-CM

## 2016-07-25 DIAGNOSIS — Z23 Encounter for immunization: Secondary | ICD-10-CM

## 2016-07-25 DIAGNOSIS — H8113 Benign paroxysmal vertigo, bilateral: Secondary | ICD-10-CM

## 2016-07-25 DIAGNOSIS — Z0001 Encounter for general adult medical examination with abnormal findings: Secondary | ICD-10-CM

## 2016-07-25 DIAGNOSIS — Z6825 Body mass index (BMI) 25.0-25.9, adult: Secondary | ICD-10-CM

## 2016-07-25 DIAGNOSIS — Z91148 Patient's other noncompliance with medication regimen for other reason: Secondary | ICD-10-CM

## 2016-07-25 DIAGNOSIS — Z9114 Patient's other noncompliance with medication regimen: Secondary | ICD-10-CM

## 2016-07-25 DIAGNOSIS — Z0000 Encounter for general adult medical examination without abnormal findings: Secondary | ICD-10-CM

## 2016-07-25 DIAGNOSIS — M26609 Unspecified temporomandibular joint disorder, unspecified side: Secondary | ICD-10-CM

## 2016-07-25 DIAGNOSIS — Z79899 Other long term (current) drug therapy: Secondary | ICD-10-CM | POA: Diagnosis not present

## 2016-07-25 DIAGNOSIS — E890 Postprocedural hypothyroidism: Secondary | ICD-10-CM

## 2016-07-25 DIAGNOSIS — H31011 Macula scars of posterior pole (postinflammatory) (post-traumatic), right eye: Secondary | ICD-10-CM

## 2016-07-25 DIAGNOSIS — E559 Vitamin D deficiency, unspecified: Secondary | ICD-10-CM

## 2016-07-25 DIAGNOSIS — G4700 Insomnia, unspecified: Secondary | ICD-10-CM

## 2016-07-25 LAB — CBC WITH DIFFERENTIAL/PLATELET
BASOS ABS: 50 {cells}/uL (ref 0–200)
BASOS PCT: 1 %
EOS ABS: 50 {cells}/uL (ref 15–500)
Eosinophils Relative: 1 %
HEMATOCRIT: 36.2 % (ref 35.0–45.0)
HEMOGLOBIN: 11.8 g/dL (ref 11.7–15.5)
LYMPHS ABS: 1600 {cells}/uL (ref 850–3900)
Lymphocytes Relative: 32 %
MCH: 28.4 pg (ref 27.0–33.0)
MCHC: 32.6 g/dL (ref 32.0–36.0)
MCV: 87 fL (ref 80.0–100.0)
MONO ABS: 450 {cells}/uL (ref 200–950)
MONOS PCT: 9 %
MPV: 8.7 fL (ref 7.5–12.5)
NEUTROS ABS: 2850 {cells}/uL (ref 1500–7800)
Neutrophils Relative %: 57 %
PLATELETS: 350 10*3/uL (ref 140–400)
RBC: 4.16 MIL/uL (ref 3.80–5.10)
RDW: 14.5 % (ref 11.0–15.0)
WBC: 5 10*3/uL (ref 3.8–10.8)

## 2016-07-25 LAB — HEPATIC FUNCTION PANEL
ALBUMIN: 4.3 g/dL (ref 3.6–5.1)
ALK PHOS: 89 U/L (ref 33–130)
ALT: 12 U/L (ref 6–29)
AST: 21 U/L (ref 10–35)
BILIRUBIN TOTAL: 0.4 mg/dL (ref 0.2–1.2)
Bilirubin, Direct: 0.1 mg/dL (ref <=0.2)
Indirect Bilirubin: 0.3 mg/dL (ref 0.2–1.2)
Total Protein: 6.7 g/dL (ref 6.1–8.1)

## 2016-07-25 LAB — BASIC METABOLIC PANEL WITH GFR
BUN: 10 mg/dL (ref 7–25)
CHLORIDE: 106 mmol/L (ref 98–110)
CO2: 22 mmol/L (ref 20–31)
Calcium: 9.1 mg/dL (ref 8.6–10.4)
Creat: 0.77 mg/dL (ref 0.50–0.99)
GFR, Est African American: >89 mL/min (ref >=60)
GFR, Est Non African American: 79 mL/min (ref >=60)
Glucose, Bld: 78 mg/dL (ref 65–99)
POTASSIUM: 4.3 mmol/L (ref 3.5–5.3)
Sodium: 141 mmol/L (ref 135–146)

## 2016-07-25 LAB — LIPID PANEL
CHOL/HDL RATIO: 2.6 ratio (ref <=5.0)
Cholesterol: 300 mg/dL — ABNORMAL HIGH (ref 125–200)
HDL: 116 mg/dL (ref >=46)
LDL Cholesterol: 171 mg/dL — ABNORMAL HIGH (ref <130)
Triglycerides: 63 mg/dL (ref <150)
VLDL: 13 mg/dL (ref <30)

## 2016-07-25 LAB — HEMOGLOBIN A1C
HEMOGLOBIN A1C: 5.1 % (ref <5.7)
MEAN PLASMA GLUCOSE: 100 mg/dL

## 2016-07-25 MED ORDER — SUMATRIPTAN SUCCINATE 6 MG/0.5ML ~~LOC~~ SOTJ: SUBCUTANEOUS | 4 refills | Status: DC

## 2016-07-25 MED ORDER — BUTALBITAL-APAP-CAFFEINE 50-325-40 MG PO TABS: 1.0000 | ORAL_TABLET | ORAL | 0 refills | Status: DC | PRN

## 2016-07-25 NOTE — Progress Notes (Signed)
MEDICARE ANNUAL WELLNESS VISIT AND FOLLOW UP  Assessment:    1. Need for prophylactic vaccination and inoculation against influenza  - Flu vaccine HIGH DOSE PF (Fluzone High dose)  2. Essential hypertension -cont meds -well controlled -dash diet -monitor at home -exercise as tolerated  3. Hyperlipidemia -cont cholesterol medication - Lipid panel  4. Vitamin D deficiency -cont Vit D  5. Prediabetes -cont diet and exercise - Hemoglobin A1c  6. Medication management  - CBC with Differential/Platelet - BASIC METABOLIC PANEL WITH GFR - Hepatic function panel  7. Postablative hypothyroidism -cont following with endocrinology  8. Benign paroxysmal positional vertigo due to bilateral vestibular disorder -cont meclizine prn  9. TMJ (temporomandibular joint syndrome) -avoid gum chewing -cont antiinfalmatory or tylenol prn  10. BMI 25.28,  adult -well controlled  11. Macular scar of right eye -followed by opthalmology  12. Insomnia, unspecified type -cont exercise -sleep hygiene     Over 30 minutes of exam, counseling, chart review, and critical decision making was performed  Future Appointments Date Time Provider [place]  08/07/2016 8:00 AM [facility] None  08/26/2016 9:15 AM Philemon Kingdom, MD [facility] None  02/04/2017 2:00 PM [name], MD [facility] None    Plan:   During the course of the visit the patient was educated and counseled about appropriate screening and preventive services including:    Pneumococcal vaccine   Influenza vaccine  Td vaccine  Prevnar 13  Screening electrocardiogram  Screening mammography  Bone densitometry screening  Colorectal cancer screening  Diabetes screening  Glaucoma screening  Nutrition counseling   Advanced directives: given info/requested copies   Subjective:   Laura Maynard is a 70 y.o. female who presents for Medicare Annual Wellness Visit and 3 month  follow up on hypertension, prediabetes, hyperlipidemia, vitamin D def.   Her blood pressure has been controlled at home, today their BP is BP: 112/70 She does not workout. She denies chest pain, shortness of breath, dizziness.  She is on cholesterol medication and denies myalgias. Her cholesterol is at goal. The cholesterol last visit was:  Lab Results  Component Value Date   CHOL 288 (H) 04/22/2016   HDL 136 04/22/2016   LDLCALC 140 (H) 04/22/2016   TRIG 62 04/22/2016   CHOLHDL 2.1 04/22/2016     Lab Results  Component Value Date   HGBA1C 5.5 04/22/2016   Last GFR Lab Results  Component Value Date   GFRNONAA 84 04/22/2016   Lab Results  Component Value Date   GFRAA >89 04/22/2016   Patient is on Vitamin D supplement. Lab Results  Component Value Date   VD25OH 12 (L) 01/16/2016     Patient reprots that she is going to see an orthopedist for her back.  She reports that the prednisone didn't  Help much.    She reports that she is having issues with her fioricet.  Medicare declined to pay for it.   She reports that she is back on tirosint.  She reports that they will not cover the medication.  She reports that she cannot afford this.  She is now back on levothyroxine.  She is seeing Dr. Cruzita Lederer.     Medication Review Current Outpatient Prescriptions on File Prior to Visit  Medication Sig Dispense Refill  . aspirin 81 MG tablet Take 81 mg by mouth daily.      . butalbital-acetaminophen-caffeine (FIORICET, ESGIC) 50-325-40 MG tablet Take 1-2 tablets by mouth every 4 (four) hours as needed. Migraines/headaches 30 tablet  0  . loratadine (CLARITIN) 10 MG tablet Take 10 mg by mouth daily as needed for allergies.   2  . meclizine (ANTIVERT) 25 MG tablet Take 1 tablet 3 x d if needed for Dizziness / Vertigo 100 tablet 999  . naproxen sodium (ANAPROX) 220 MG tablet Take 440 mg by mouth 2 (two) times daily as needed (pain).    . SUMAtriptan Succinate 6 MG/0.5ML SOTJ Inject  subcutaneous into skin as needed for migraines 6 Device 0  . TIROSINT 25 MCG CAPS Take 1 capsule every morning, fasting 45 capsule 2  . [DISCONTINUED] amitriptyline (ELAVIL) 25 MG tablet 1-2 at night for headache (Patient not taking: Reported on 11/07/2015) 60 tablet 0   No current facility-administered medications on file prior to visit.     Allergies: Allergies  Allergen Reactions  . Penicillins Hives  . Pravastatin     Myalgia  . Sulfa Antibiotics Hives  . Zetia [Ezetimibe]     Myalgia    Current Problems (verified) has Hypertension; Prediabetes; Vitamin D deficiency; Hyperlipidemia; Medication management; Poor compliance with medication; Chorioretinal scar, macular; Insomnia; Benign paroxysmal positional vertigo; TMJ (temporomandibular joint syndrome); Non compliance w medication regimen; Postablative hypothyroidism; and BMI 25.28,  adult on her problem list.  Screening Tests Immunization History  Administered Date(s) Administered  . Influenza Split 06/30/2013  . Influenza, High Dose Seasonal PF 06/26/2015  . Pneumococcal Conjugate-13 09/14/2014  . Pneumococcal Polysaccharide-23 10/16/2011  . Td 10/16/2011  . Zoster 01/14/2007    Preventative care: Last colonoscopy: 2006 Last mammogram: 2016 Last pap smear/pelvic exam:     Prior vaccinations: TD or Tdap: 2013  Influenza: 2017  Pneumococcal:  2013 Prevnar13:  2015 Shingles/Zostavax: 2008  Names of Other Physician/Practitioners you currently use: 1. [facility]- here for primary care 2. Dr. , eye doctor, last visit    3. Dr. Zenaida Niece , dentist, last visit 2017 Patient Care Team: [name], MD as PCP - General (Internal Medicine) Vanessa Kick, MD (Dermatology) Philemon Kingdom, MD as Consulting Physician (Internal Medicine) Marcial Pacas, MD as Consulting Physician (Neurology) Gatha Mayer, MD as Consulting Physician (Gastroenterology) Rozetta Nunnery, MD as Consulting  Physician (Otolaryngology) Gaynelle Arabian, MD as Consulting Physician (Orthopedic Surgery)  Surgical: She  has a past surgical history that includes Eye surgery (1964); Back surgery (1970); Nasal septum surgery (1998); Total abdominal hysterectomy (2000); Knee arthroscopy (2002); and Colonoscopy (2006; 03/28/11). Family Her family history includes COPD in her sister; Diabetes in her brother; Emphysema in her father; Lung cancer (age of onset: 60) in her sister; Stroke in her mother; Throat cancer (age of onset: 44) in her brother. Social history  She reports that she has never smoked. She has never used smokeless tobacco. She reports that she does not drink alcohol or use drugs.  MEDICARE WELLNESS OBJECTIVES: Physical activity:   Cardiac risk factors:   Depression/mood screen:   Depression screen [facility] 2/9 01/16/2016  Decreased Interest 0  Down, Depressed, Hopeless 0  PHQ - 2 Score 0    ADLs:  In your present state of health, do you have any difficulty performing the following activities: 01/16/2016  Hearing? N  Vision? N  Difficulty concentrating or making decisions? N  Walking or climbing stairs? N  Dressing or bathing? N  Doing errands, shopping? N  Some recent data might be hidden     Cognitive Testing  Alert? Yes  Normal Appearance?Yes  Oriented to person? Yes  Place? Yes   Time? Yes  Recall of three objects?  Yes  Can perform simple calculations? Yes  Displays appropriate judgment?Yes  Can read the correct time from a watch face?Yes  EOL planning:     Objective:   Today's Vitals   07/25/16 0844  BP: 112/70  Pulse: 80  Resp: 16  Temp: 98.2 F (36.8 C)  TempSrc: Temporal  Weight: 165 lb (74.8 kg)  Height: 5' 6.5" (1.689 m)   Wt Readings from Last 3 Encounters:  07/25/16 165 lb (74.8 kg)  05/24/16 157 lb (71.2 kg)  04/22/16 158 lb (71.7 kg)    Body mass index is 26.23 kg/m.  General appearance: alert, no distress, WD/WN,  female HEENT: normocephalic,  sclerae anicteric, TMs pearly, nares patent, no discharge or erythema, pharynx normal Oral cavity: MMM, no lesions Neck: supple, no lymphadenopathy, no thyromegaly, no masses Heart: RRR, normal S1, S2, no murmurs Lungs: CTA bilaterally, no wheezes, rhonchi, or rales Abdomen: +bs, soft, non tender, non distended, no masses, no hepatomegaly, no splenomegaly Musculoskeletal: nontender, no swelling, no obvious deformity Extremities: no edema, no cyanosis, no clubbing Pulses: 2+ symmetric, upper and lower extremities, normal cap refill Neurological: alert, oriented x 3, CN2-12 intact, strength normal upper extremities and lower extremities, sensation normal throughout, DTRs 2+ throughout, no cerebellar signs, gait normal Psychiatric: normal affect, behavior normal, pleasant  Breast: defer Gyn: defer Rectal: defer   Medicare Attestation I have personally reviewed: The patient's medical and social history Their use of alcohol, tobacco or illicit drugs Their current medications and supplements The patient's functional ability including ADLs,fall risks, home safety risks, cognitive, and hearing and visual impairment Diet and physical activities Evidence for depression or mood disorders  The patient's weight, height, BMI, and visual acuity have been recorded in the chart.  I have made referrals, counseling, and provided education to the patient based on review of the above and I have provided the patient with a written personalized care plan for preventive services.     Starlyn Skeans, PA-C   07/25/2016

## 2016-07-30 ENCOUNTER — Other Ambulatory Visit: Payer: Self-pay | Admitting: *Deleted

## 2016-07-30 MED ORDER — ROSUVASTATIN CALCIUM 10 MG PO TABS: 10.0000 mg | ORAL_TABLET | Freq: Every day | ORAL | 3 refills | Status: DC

## 2016-08-07 ENCOUNTER — Other Ambulatory Visit: Payer: Medicare Other

## 2016-08-08 ENCOUNTER — Other Ambulatory Visit: Payer: Medicare Other

## 2016-08-12 ENCOUNTER — Other Ambulatory Visit (INDEPENDENT_AMBULATORY_CARE_PROVIDER_SITE_OTHER): Payer: Medicare Other

## 2016-08-12 DIAGNOSIS — E890 Postprocedural hypothyroidism: Secondary | ICD-10-CM

## 2016-08-12 LAB — TSH: TSH: 11.77 u[IU]/mL — ABNORMAL HIGH (ref 0.35–4.50)

## 2016-08-12 LAB — T4, FREE: FREE T4: 0.65 ng/dL (ref 0.60–1.60)

## 2016-08-13 ENCOUNTER — Other Ambulatory Visit: Payer: Self-pay

## 2016-08-13 MED ORDER — TIROSINT 50 MCG PO CAPS: 50.0000 ug | ORAL_CAPSULE | Freq: Every day | ORAL | 2 refills | Status: DC

## 2016-08-16 ENCOUNTER — Other Ambulatory Visit: Payer: Self-pay

## 2016-08-16 ENCOUNTER — Telehealth: Payer: Self-pay | Admitting: Internal Medicine

## 2016-08-16 MED ORDER — LEVOTHYROXINE SODIUM 50 MCG PO TABS: 50.0000 ug | ORAL_TABLET | Freq: Every day | ORAL | 3 refills | Status: DC

## 2016-08-16 NOTE — Telephone Encounter (Signed)
Sent!

## 2016-08-16 NOTE — Telephone Encounter (Signed)
Okay to send levothyroxine tablets and take off Tirosint from her medication list. Please send the 50 g dose.

## 2016-08-16 NOTE — Telephone Encounter (Signed)
Pt called in and said that the wrong medication was sent into the pharmacy, she needs the levothyroxine sent into the pharmacy because she cannot afford the other medication.  Please correct and give Pt a call when submitted.

## 2016-08-26 ENCOUNTER — Encounter: Payer: Self-pay | Admitting: Internal Medicine

## 2016-08-26 ENCOUNTER — Ambulatory Visit (INDEPENDENT_AMBULATORY_CARE_PROVIDER_SITE_OTHER): Payer: Medicare Other | Admitting: Internal Medicine

## 2016-08-26 VITALS — BP 122/70 | HR 89 | Wt 162.0 lb

## 2016-08-26 DIAGNOSIS — E890 Postprocedural hypothyroidism: Secondary | ICD-10-CM

## 2016-08-26 NOTE — Progress Notes (Signed)
Patient ID: Laura Maynard, female   DOB: 1945/11/11, 70 y.o.   MRN: [id number]   HPI  Laura Maynard is a 70 y.o.-year-old female, returning for f/u for postablative hypothyroidism post toxic multinodular goiter (TMNG) RAI tx. Last visit 3 mo ago.  She had to stop Tirosing since last visit 2/2 price >> now on generic LT4.  Reviewed hx: Pt had screening labs in 01/2014 and was found to have a low TSH. This was repeated 2x and the TSH continued to decrease.   Thyroid Uptake and scan (05/20/2014) >> uptake 48.5% and scan c/w TMNG.  We started MMI 5 mg bid.  Had RAI tx on 06/09/2014.   After RAI, she developed hypothyroidism >> started LT4 50 mcg daily. She has leg cramps that resolve after stopping LT4, but TSH started to increase >> at last visit we tried to change to Tirosint >> could not afford this >> LT4 25 >> 50 mcg daily (dose increased 08/12/2016).  She is taking the LT4: - am - fasting - with water - eats 1h later - no Ca, MVI, Fe, PPI  I reviewed pt's thyroid tests: Lab Results  Component Value Date   TSH 11.77 (H) 08/12/2016   TSH 24.92 (H) 06/26/2016   TSH 3.46 04/22/2016   TSH 1.59 02/20/2016   TSH 0.16 (L) 01/16/2016   TSH 7.23 (H) 11/09/2015   TSH 3.53 08/31/2015   TSH 0.15 (L) 07/06/2015   TSH 0.18 (L) 05/31/2015   TSH 3.81 04/12/2015   FREET4 0.65 08/12/2016   FREET4 0.44 (L) 06/26/2016   FREET4 0.79 02/20/2016   FREET4 0.83 11/09/2015   FREET4 0.77 08/31/2015   FREET4 1.05 07/06/2015   FREET4 1.01 05/31/2015   FREET4 0.90 04/12/2015   FREET4 0.69 12/14/2014   FREET4 0.38 (L) 11/08/2014    Pt denies feeling nodules in neck, hoarseness, dysphagia/odynophagia, SOB with lying down; she c/o: - + leg cramps - improved - no weight gain or loss - no fatigue - no insomnia - no weight loss - no heat intolerance - no tremors - no anxiety - no hair loss  - no dry skin - no hyperdefecation, no constipation, no diarrhea  She also has a history of HTN, HL,  preDM, vit D def., anemia.  She has a h/o vit D def.  ROS: Constitutional: see HPI Eyes: no blurry vision, no xerophthalmia ENT: no sore throat, no nodules palpated in throat, no dysphagia/odynophagia, no hoarseness Cardiovascular: no CP/SOB/palpitations/leg swelling Respiratory: occasional cough/no SOB Gastrointestinal: no N/V/D/C/heartburn Musculoskeletal: no muscle aches/joint aches Skin: no rashes,  + hair loss  Neurological: no tremors/numbness/tingling/dizziness  I reviewed pt's medications, allergies, PMH, social hx, family hx, and changes were documented in the history of present illness. Otherwise, unchanged from my initial visit note.  PE: BP 122/70   Pulse 89   Wt 162 lb (73.5 kg)   SpO2 99%   BMI 25.76 kg/m  Body mass index is 25.76 kg/m. Wt Readings from Last 3 Encounters:  08/26/16 162 lb (73.5 kg)  07/25/16 165 lb (74.8 kg)  05/24/16 157 lb (71.2 kg)   Constitutional: normal weight, in NAD Eyes: surgical pupil R eye, EOMI ENT: moist mucous membranes, no thyromegaly, no cervical lymphadenopathy Cardiovascular: RRR, no MRG, no LE edema Respiratory: CTA B Gastrointestinal: abdomen soft, NT, ND, BS+ Musculoskeletal: no deformities, strength intact in all 4 Skin: moist, warm, no rashes Neurological: no tremor with outstretched hands, DTR normal in all 4  ASSESSMENT: 1. Hypothyroidism after  RAI tx   2. H/o Toxic MNG  PLAN:  1. And 2. Patient with h/o thyrotoxicosis 2/2 TMNG, previously with some thyrotoxic sxs: weight loss, anxiety, insomnia; now all resolved after the RAI tx. She developped hypothyroidism after RAI tx >> now on LT4 50 mcg daily. She initially had severe leg cramps >> she actually stopped LT4 >> I tried to send Tirosint, but too expensive >> now back on LT4 >> leg cramps not as bad despite also starting statins 2 weeks ago (Crestor). - We again discussed that her thyroid is still functioning after her radioactive iodine treatment, however, but  not 100% - We discussed how to take the LT4 correctly: every day, with water, >30 minutes before breakfast, separated by >4 hours from acid reflux medications, calcium, iron, multivitamins. She is taking it correctly - will check TSH, fT4 in 1 mo (6 weeks on the new, 50 mcg, LT4 dose) - RTC in 6 months, but in 1 mo for labs.  Orders Placed This Encounter  Procedures  . T4, free  . TSH   Philemon Kingdom, MD PhD [facility] Endocrinology

## 2016-08-26 NOTE — Patient Instructions (Addendum)
Please continue Levothyroxine 50 mcg daily.  Take the thyroid hormone every day, with water, at least 30 minutes before breakfast, separated by at least 4 hours from: - acid reflux medications - calcium - iron - multivitamins  Please come back for labs in 4 weeks.  Please return in 6 months.

## 2016-10-02 ENCOUNTER — Other Ambulatory Visit (INDEPENDENT_AMBULATORY_CARE_PROVIDER_SITE_OTHER): Payer: Medicare Other

## 2016-10-02 DIAGNOSIS — E890 Postprocedural hypothyroidism: Secondary | ICD-10-CM

## 2016-10-02 LAB — TSH: TSH: 1.31 u[IU]/mL (ref 0.35–4.50)

## 2016-10-02 LAB — T4, FREE: FREE T4: 0.87 ng/dL (ref 0.60–1.60)

## 2016-10-03 ENCOUNTER — Telehealth: Payer: Self-pay

## 2016-10-03 NOTE — Telephone Encounter (Signed)
Called patient and notified her of results, no questions at this time.

## 2016-10-05 ENCOUNTER — Encounter: Payer: Self-pay | Admitting: *Deleted

## 2016-11-05 ENCOUNTER — Encounter: Payer: Self-pay | Admitting: Internal Medicine

## 2016-11-05 ENCOUNTER — Ambulatory Visit (INDEPENDENT_AMBULATORY_CARE_PROVIDER_SITE_OTHER): Payer: Medicare Other | Admitting: Internal Medicine

## 2016-11-05 VITALS — BP 110/64 | HR 88 | Temp 97.4°F | Resp 16 | Ht 66.5 in | Wt 161.8 lb

## 2016-11-05 DIAGNOSIS — E782 Mixed hyperlipidemia: Secondary | ICD-10-CM | POA: Diagnosis not present

## 2016-11-05 DIAGNOSIS — E559 Vitamin D deficiency, unspecified: Secondary | ICD-10-CM

## 2016-11-05 DIAGNOSIS — M26623 Arthralgia of bilateral temporomandibular joint: Secondary | ICD-10-CM

## 2016-11-05 DIAGNOSIS — R7303 Prediabetes: Secondary | ICD-10-CM

## 2016-11-05 DIAGNOSIS — I10 Essential (primary) hypertension: Secondary | ICD-10-CM

## 2016-11-05 DIAGNOSIS — Z79899 Other long term (current) drug therapy: Secondary | ICD-10-CM

## 2016-11-05 LAB — HEPATIC FUNCTION PANEL
ALBUMIN: 4.3 g/dL (ref 3.6–5.1)
ALT: 12 U/L (ref 6–29)
AST: 17 U/L (ref 10–35)
Alkaline Phosphatase: 84 U/L (ref 33–130)
Bilirubin, Direct: 0.1 mg/dL (ref <=0.2)
Indirect Bilirubin: 0.2 mg/dL (ref 0.2–1.2)
TOTAL PROTEIN: 6.5 g/dL (ref 6.1–8.1)
Total Bilirubin: 0.3 mg/dL (ref 0.2–1.2)

## 2016-11-05 LAB — LIPID PANEL
CHOLESTEROL: 208 mg/dL — AB (ref <200)
HDL: 108 mg/dL (ref >50)
LDL Cholesterol: 83 mg/dL (ref <100)
Total CHOL/HDL Ratio: 1.9 Ratio (ref <5.0)
Triglycerides: 84 mg/dL (ref <150)
VLDL: 17 mg/dL (ref <30)

## 2016-11-05 LAB — CBC WITH DIFFERENTIAL/PLATELET
BASOS ABS: 54 {cells}/uL (ref 0–200)
Basophils Relative: 1 %
EOS PCT: 2 %
Eosinophils Absolute: 108 cells/uL (ref 15–500)
HEMATOCRIT: 35.9 % (ref 35.0–45.0)
HEMOGLOBIN: 11.5 g/dL — AB (ref 11.7–15.5)
LYMPHS ABS: 2322 {cells}/uL (ref 850–3900)
Lymphocytes Relative: 43 %
MCH: 27.2 pg (ref 27.0–33.0)
MCHC: 32 g/dL (ref 32.0–36.0)
MCV: 84.9 fL (ref 80.0–100.0)
MPV: 8.7 fL (ref 7.5–12.5)
Monocytes Absolute: 594 cells/uL (ref 200–950)
Monocytes Relative: 11 %
NEUTROS PCT: 43 %
Neutro Abs: 2322 cells/uL (ref 1500–7800)
Platelets: 340 10*3/uL (ref 140–400)
RBC: 4.23 MIL/uL (ref 3.80–5.10)
RDW: 14 % (ref 11.0–15.0)
WBC: 5.4 10*3/uL (ref 3.8–10.8)

## 2016-11-05 LAB — BASIC METABOLIC PANEL WITH GFR
BUN: 13 mg/dL (ref 7–25)
CALCIUM: 9.1 mg/dL (ref 8.6–10.4)
CO2: 30 mmol/L (ref 20–31)
CREATININE: 0.83 mg/dL (ref 0.50–0.99)
Chloride: 105 mmol/L (ref 98–110)
GFR, Est African American: 83 mL/min (ref >=60)
GFR, Est Non African American: 72 mL/min (ref >=60)
GLUCOSE: 89 mg/dL (ref 65–99)
Potassium: 4.1 mmol/L (ref 3.5–5.3)
SODIUM: 143 mmol/L (ref 135–146)

## 2016-11-05 LAB — HEMOGLOBIN A1C
Hgb A1c MFr Bld: 5.2 % (ref <5.7)
MEAN PLASMA GLUCOSE: 103 mg/dL

## 2016-11-05 LAB — TSH: TSH: 3.77 mIU/L

## 2016-11-05 LAB — MAGNESIUM: MAGNESIUM: 2 mg/dL (ref 1.5–2.5)

## 2016-11-05 MED ORDER — PREDNISONE 20 MG PO TABS: ORAL_TABLET | ORAL | 0 refills | Status: DC

## 2016-11-05 NOTE — Progress Notes (Signed)
[facility] [name], M.D.        Uvaldo Bristle. [name], P.A.-C       Starlyn Skeans, P.A.-C  [facility]                [address] [place], [place] [id number] Telephone [phone] Telefax [phone] ______________________________________________________________________     This very nice 71 y.o. DBF presents for  follow up with Hypertension, Hyperlipidemia, Pre-Diabetes, Hypothyroidism and Vitamin D Deficiency. Patient also has had major issues with recommended compliance which she attributes to her medicine phobia.      Today she also c/o's of Bilat ear pains.      Patient is treated for HTN & BP has been controlled at home. Today's BP 9is at goal - 110/64. Patient has had no complaints of any cardiac type chest pain, palpitations, dyspnea/orthopnea/PND, dizziness, claudication, or dependent edema.     Hyperlipidemia is not controlled with diet & meds in the past , but now she alleges she is taking low dose Crestor alternate days w/o complaints of  myalgias or other med SE's. Last Lipids were not at goal (before restarting Crestor): Lab Results  Component Value Date   CHOL 300 (H) 07/25/2016   HDL 116 07/25/2016   LDLCALC 171 (H) 07/25/2016   TRIG 63 07/25/2016   CHOLHDL 2.6 07/25/2016      Also, the patient has history of PreDiabetes (A1c 5.7% in 2012) and has had no symptoms of reactive hypoglycemia, diabetic polys, paresthesias or visual blurring.  Last A1c was at goal: Lab Results  Component Value Date   HGBA1C 5.1 07/25/2016      In Sept 2015, patient was treated by Dr Cruzita Lederer with I-131 for toxic multi-nodular goiter and patient alleges compliance with her thyroid replacement therapy.      Further, the patient also has history of Vitamin D Deficiency ("10" in 2010) and does not supplements vitamin D as repeatedly advised. Last vitamin D was not at goal: Lab Results  Component Value  Date   VD25OH 12 (L) 01/16/2016   Current Outpatient Prescriptions on File Prior to Visit  Medication Sig  . aspirin 81 MG tablet Take 81 mg by mouth daily.    . butalbital-acetaminophen-caffeine (FIORICET, ESGIC) 50-325-40 MG tablet Take 1-2 tablets by mouth every 4 (four) hours as needed. Migraines/headaches  . levothyroxine (SYNTHROID, LEVOTHROID) 50 MCG tablet Take 1 tablet (50 mcg total) by mouth daily.  Marland Kitchen loratadine (CLARITIN) 10 MG tablet Take 10 mg by mouth daily as needed for allergies.   Marland Kitchen meclizine (ANTIVERT) 25 MG tablet Take 1 tablet 3 x d if needed for Dizziness / Vertigo  . naproxen sodium (ANAPROX) 220 MG tablet Take 440 mg by mouth 2 (two) times daily as needed (pain).  . rosuvastatin (CRESTOR) 10 MG tablet Take 1 tablet (10 mg total) by mouth at bedtime.  . SUMAtriptan Succinate 6 MG/0.5ML SOTJ Inject subcutaneous into skin as needed for migraines  . [DISCONTINUED] amitriptyline (ELAVIL) 25 MG tablet 1-2 at night for headache (Patient not taking: Reported on 11/07/2015)   No current facility-administered medications on file prior to visit.    Allergies  Allergen Reactions  . Penicillins Hives  . Pravastatin     Myalgia  . Sulfa Antibiotics Hives  . Zetia [Ezetimibe]     Myalgia   PMHx:   Past Medical History:  Diagnosis  Date  . Anemia   . Blind right eye    legally  . Cataract   . Hyperlipidemia   . Hypertension   . Migraine   . Neuropathy ([place])   . Prediabetes   . Sarcoidosis, lung ([place]) 15 YRS AGO   NO TREATMENTS  . 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 Vitamin D deficiency    Immunization History  Administered Date(s) Administered  . Influenza Split 06/30/2013  . Influenza, High Dose Seasonal PF 06/26/2015, 07/25/2016  . Pneumococcal Conjugate-13 09/14/2014  . Pneumococcal Polysaccharide-23 10/16/2011  . Td 10/16/2011  . Zoster 01/14/2007   Past Surgical History:  Procedure Laterality Date  . BACK SURGERY  1970  . COLONOSCOPY  2006; 03/28/11    2 small adenomas; normal  . EYE SURGERY  1964   right eye  . KNEE ARTHROSCOPY  2002  . NASAL SEPTUM SURGERY  1998  . TOTAL ABDOMINAL HYSTERECTOMY  2000   FHx:    Reviewed / unchanged  SHx:    Reviewed / unchanged  Systems Review:  Constitutional: Denies fever, chills, wt changes, headaches, insomnia, fatigue, night sweats, change in appetite. Eyes: Denies redness, blurred vision, diplopia, discharge, itchy, watery eyes.  ENT: Denies discharge, congestion, post nasal drip, epistaxis, sore throat, earache, hearing loss, dental pain, tinnitus, vertigo, sinus pain, snoring.  CV: Denies chest pain, palpitations, irregular heartbeat, syncope, dyspnea, diaphoresis, orthopnea, PND, claudication or edema. Respiratory: denies cough, dyspnea, DOE, pleurisy, hoarseness, laryngitis, wheezing.  Gastrointestinal: Denies dysphagia, odynophagia, heartburn, reflux, water brash, abdominal pain or cramps, nausea, vomiting, bloating, diarrhea, constipation, hematemesis, melena, hematochezia  or hemorrhoids. Genitourinary: Denies dysuria, frequency, urgency, nocturia, hesitancy, discharge, hematuria or flank pain. Musculoskeletal: Denies arthralgias, myalgias, stiffness, jt. swelling, pain, limping or strain/sprain.  Skin: Denies pruritus, rash, hives, warts, acne, eczema or change in skin lesion(s). Neuro: No weakness, tremor, incoordination, spasms, paresthesia or pain. Psychiatric: Denies confusion, memory loss or sensory loss. Endo: Denies change in weight, skin or hair change.  Heme/Lymph: No excessive bleeding, bruising or enlarged lymph nodes.  Physical Exam  BP 110/64   Pulse 88   Temp 97.4 F (36.3 C)   Resp 16   Ht 5' 6.5" (1.689 m)   Wt 161 lb 12.8 oz (73.4 kg)   BMI 25.72 kg/m   Appears well nourished and in no distress.  Eyes: PERRLA, EOMs, conjunctiva no swelling or erythema. Sinuses: No frontal/maxillary tenderness ENT/Mouth: EAC's clear, TM's nl w/o erythema, bulging. (+)  tender bilat TMJ's. Nares clear w/o erythema, swelling, exudates. Oropharynx clear without erythema or exudates. Oral hygiene is good. Tongue normal, non obstructing. Hearing intact.  Neck: Supple. Thyroid nl. Car 2+/2+ without bruits, nodes or JVD. Chest: Respirations nl with BS clear & equal w/o rales, rhonchi, wheezing or stridor.  Cor: Heart sounds normal w/ regular rate and rhythm without sig. murmurs, gallops, clicks, or rubs. Peripheral pulses normal and equal  without edema.  Abdomen: Soft & bowel sounds normal. Non-tender w/o guarding, rebound, hernias, masses, or organomegaly.  Lymphatics: Unremarkable.  Musculoskeletal: Full ROM all peripheral extremities, joint stability, 5/5 strength, and normal gait.  Skin: Warm, dry without exposed rashes, lesions or ecchymosis apparent.  Neuro: Cranial nerves intact, reflexes equal bilaterally. Sensory-motor testing grossly intact. Tendon reflexes grossly intact.  Pysch: Alert & oriented x 3.  Insight and judgement nl & appropriate. No ideations.  Assessment and Plan:  1. Essential hypertension  - Continue medication, monitor blood pressure at home.  - Continue DASH diet. Reminder to go to  the ER if any CP,  SOB, nausea, dizziness, severe HA, changes vision/speech,  left arm numbness and tingling and jaw pain.  - CBC with Differential/Platelet - BASIC METABOLIC PANEL WITH GFR - TSH  2. Hyperlipidemia  - Continue diet/meds, exercise,& lifestyle modifications.  - Continue monitor periodic cholesterol/liver & renal functions   - Hepatic function panel - Lipid panel - TSH  3. Prediabetes  - Continue diet, exercise, lifestyle modifications.  - Monitor appropriate labs.  - Hemoglobin A1c - Insulin, random  4. Vitamin D deficiency  - Continue supplementation.  - VITAMIN D 25 Hydroxy   5. Medication management  - CBC with Differential/Platelet - BASIC METABOLIC PANEL WITH GFR - Hepatic function panel - Magnesium - Lipid  panel - TSH - VITAMIN D 25 Hydroxy   6. TMJD  - Rx Prednisone pulse taper       Recommended regular exercise, BP monitoring, weight control, and discussed med and SE's. Recommended labs to assess and monitor clinical status. Further disposition pending results of labs. Over 30 minutes of exam, counseling, chart review was performed

## 2016-11-05 NOTE — Patient Instructions (Addendum)
Recommend Adult Low Dose Aspirin or  coated  Aspirin 81 mg daily  To reduce risk of Colon Cancer 20 %,  Skin Cancer 26 % ,  Melanoma 46%  and  Pancreatic cancer 60% +++++++++++++++++++++++++ Vitamin D goal  Your Vitamin D Level was 12   is between 70-100.  Please make sure that you are taking your Vitamin D as directed.  It is very important as a natural anti-inflammatory  helping hair, skin, and nails, as well as reducing stroke and heart attack risk.  It helps your bones and helps with mood. It also decreases numerous cancer risks so please take it as directed.  Low Vit D is associated with a 200-300% higher risk for CANCER  and 200-300% higher risk for HEART   ATTACK  &  STROKE.   .....................................Marland Kitchen It is also associated with higher death rate at younger ages,  autoimmune diseases like Rheumatoid arthritis, Lupus, Multiple Sclerosis.    Also many other serious conditions, like depression, Alzheimer's Dementia, infertility, muscle aches, fatigue, fibromyalgia - just to name a few. ++++++++++++++++++++ Recommend the book "The END of DIETING" by Dr Excell Seltzer  & the book "The END of DIABETES " by Dr Excell Seltzer At [email or web address] - get book & Audio CD's    Being diabetic has a  300% increased risk for heart attack, stroke, cancer, and alzheimer- type vascular dementia. It is very important that you work harder with diet by avoiding all foods that are white. Avoid white rice (brown & wild rice is OK), white potatoes (sweetpotatoes in moderation is OK), White bread or wheat bread or anything made out of white flour like bagels, donuts, rolls, buns, biscuits, cakes, pastries, cookies, pizza crust, and pasta (made from white flour & egg whites) - vegetarian pasta or spinach or wheat pasta is OK. Multigrain breads like Arnold's or Pepperidge [employer], or multigrain sandwich thins or flatbreads.  Diet, exercise and weight loss can reverse and cure diabetes in the early stages.   Diet, exercise and weight loss is very important in the control and prevention of complications of diabetes which affects every system in your body, ie. Brain - dementia/stroke, eyes - glaucoma/blindness, heart - heart attack/heart failure, kidneys - dialysis, stomach - gastric paralysis, intestines - malabsorption, nerves - severe painful neuritis, circulation - gangrene & loss of a leg(s), and finally cancer and Alzheimers.    I recommend avoid fried & greasy foods,  sweets/candy, white rice (brown or wild rice or Quinoa is OK), white potatoes (sweet potatoes are OK) - anything made from white flour - bagels, doughnuts, rolls, buns, biscuits,white and wheat breads, pizza crust and traditional pasta made of white flour & egg white(vegetarian pasta or spinach or wheat pasta is OK).  Multi-grain bread is OK - like multi-grain flat bread or sandwich thins. Avoid alcohol in excess. Exercise is also important.    Eat all the vegetables you want - avoid meat, especially red meat and dairy - especially cheese.  Cheese is the most concentrated form of trans-fats which is the worst thing to clog up our arteries. Veggie cheese is OK which can be found in the fresh produce section at [employer] or [employer] or [employer]  +++++++++++++++++++++ DASH Eating Plan  DASH stands for "Dietary Approaches to Stop Hypertension."   The DASH eating plan is a healthy eating plan that has been shown to reduce high blood pressure (hypertension). Additional health benefits may include reducing the risk of type 2 diabetes mellitus, heart disease,  and stroke. The DASH eating plan may also help with weight loss. WHAT DO I NEED TO KNOW ABOUT THE DASH EATING PLAN? For the DASH eating plan, you will follow these general guidelines:  Choose foods with a percent daily value for sodium of less than 5% (as listed on the food label).  Use salt-free seasonings or herbs instead of table salt or sea salt.  Check with your health  care provider or pharmacist before using salt substitutes.  Eat lower-sodium products, often labeled as "lower sodium" or "no salt added."  Eat fresh foods.  Eat more vegetables, fruits, and low-fat dairy products.  Choose whole grains. Look for the word "whole" as the first word in the ingredient list.  Choose fish   Limit sweets, desserts, sugars, and sugary drinks.  Choose heart-healthy fats.  Eat veggie cheese   Eat more home-cooked food and less restaurant, buffet, and fast food.  Limit fried foods.  Cook foods using methods other than frying.  Limit canned vegetables. If you do use them, rinse them well to decrease the sodium.  When eating at a restaurant, ask that your food be prepared with less salt, or no salt if possible.                      WHAT FOODS CAN I EAT? Read Dr Fara Olden Fuhrman's books on The End of Dieting & The End of Diabetes  Grains Whole grain or whole wheat bread. Brown rice. Whole grain or whole wheat pasta. Quinoa, bulgur, and whole grain cereals. Low-sodium cereals. Corn or whole wheat flour tortillas. Whole grain cornbread. Whole grain crackers. Low-sodium crackers.  Vegetables Fresh or frozen vegetables (raw, steamed, roasted, or grilled). Low-sodium or reduced-sodium tomato and vegetable juices. Low-sodium or reduced-sodium tomato sauce and paste. Low-sodium or reduced-sodium canned vegetables.   Fruits All fresh, canned (in natural juice), or frozen fruits.  Protein Products  All fish and seafood.  Dried beans, peas, or lentils. Unsalted nuts and seeds. Unsalted canned beans.  Dairy Low-fat dairy products, such as skim or 1% milk, 2% or reduced-fat cheeses, low-fat ricotta or cottage cheese, or plain low-fat yogurt. Low-sodium or reduced-sodium cheeses.  Fats and Oils Tub margarines without trans fats. Light or reduced-fat mayonnaise and salad dressings (reduced sodium). Avocado. Safflower, olive, or canola oils. Natural peanut or almond  butter.  Other Unsalted popcorn and pretzels. The items listed above may not be a complete list of recommended foods or beverages. Contact your dietitian for more options.  +++++++++++++++  WHAT FOODS ARE NOT RECOMMENDED? Grains/ White flour or wheat flour White bread. White pasta. White rice. Refined cornbread. Bagels and croissants. Crackers that contain trans fat.  Vegetables  Creamed or fried vegetables. Vegetables in a . Regular canned vegetables. Regular canned tomato sauce and paste. Regular tomato and vegetable juices.  Fruits Dried fruits. Canned fruit in light or heavy syrup. Fruit juice.  Meat and Other Protein Products Meat in general - RED meat & White meat.  Fatty cuts of meat. Ribs, chicken wings, all processed meats as bacon, sausage, bologna, salami, fatback, hot dogs, bratwurst and packaged luncheon meats.  Dairy Whole or 2% milk, cream, half-and-half, and cream cheese. Whole-fat or sweetened yogurt. Full-fat cheeses or blue cheese. Non-dairy creamers and whipped toppings. Processed cheese, cheese spreads, or cheese curds.  Condiments Onion and garlic salt, seasoned salt, table salt, and sea salt. Canned and packaged gravies. Worcestershire sauce. Tartar sauce. Barbecue sauce. Teriyaki sauce. Soy sauce, including reduced  sodium. Steak sauce. Fish sauce. Oyster sauce. Cocktail sauce. Horseradish. Ketchup and mustard. Meat flavorings and tenderizers. Bouillon cubes. Hot sauce. Tabasco sauce. Marinades. Taco seasonings. Relishes.  Fats and Oils Butter, stick margarine, lard, shortening and bacon fat. Coconut, palm kernel, or palm oils. Regular salad dressings.  Pickles and olives. Salted popcorn and pretzels.  The items listed above may not be a complete list of foods and beverages to avoid.

## 2016-11-06 LAB — INSULIN, RANDOM: Insulin: 16.7 u[IU]/mL (ref 2.0–19.6)

## 2016-11-06 LAB — VITAMIN D 25 HYDROXY (VIT D DEFICIENCY, FRACTURES): Vit D, 25-Hydroxy: 8 ng/mL — ABNORMAL LOW (ref 30–100)

## 2016-11-15 ENCOUNTER — Ambulatory Visit (INDEPENDENT_AMBULATORY_CARE_PROVIDER_SITE_OTHER): Payer: Medicare Other | Admitting: Internal Medicine

## 2016-11-15 ENCOUNTER — Encounter: Payer: Self-pay | Admitting: Internal Medicine

## 2016-11-15 VITALS — BP 118/74 | HR 68 | Temp 97.7°F | Resp 16 | Ht 66.5 in | Wt 161.0 lb

## 2016-11-15 DIAGNOSIS — J3089 Other allergic rhinitis: Secondary | ICD-10-CM | POA: Diagnosis not present

## 2016-11-15 MED ORDER — AZELASTINE HCL 0.1 % NA SOLN: 2.0000 | Freq: Two times a day (BID) | NASAL | 2 refills | Status: DC

## 2016-11-15 MED ORDER — PROMETHAZINE-DM 6.25-15 MG/5ML PO SYRP: ORAL_SOLUTION | ORAL | 1 refills | Status: DC

## 2016-11-15 NOTE — Patient Instructions (Signed)
Please use astelin twice daily.  Please use 2 sprays in each nostril.  Please continue to do 2 sprays per nostril of flonase at nighttime.  Please continue your claritin daily.  Please use phenergan dm cough syrup for both cough and congestion.  You can take 25-50 mg of benadryl at bedtime as needed for drainage and cough.    Try using saline nasal spray or a netti pot to help clear out drainage in your nose.  Please call if you have no improvement in the next week.

## 2016-11-15 NOTE — Progress Notes (Signed)
HPI  Patient presents to the office for evaluation of sore throat and sweating.  It has been going on for 1 days.  Patient reports dry minimal coughing.  They also endorse change in voice, postnasal drip and sore throat which is scratchy.  .  They have tried prednisone.  They report that nothing has worked.  They denies other sick contacts.  She has not sick contacts that she is aware of.    Review of Systems  Constitutional: Positive for malaise/fatigue. Negative for chills and fever.  HENT: Positive for congestion, ear pain, hearing loss and sore throat.   Respiratory: Positive for cough. Negative for sputum production, shortness of breath and wheezing.   Cardiovascular: Negative for chest pain, palpitations and leg swelling.  Neurological: Positive for headaches.    PE:  Vitals:   11/15/16 0952  BP: 118/74  Pulse: 68  Resp: 16  Temp: 97.7 F (36.5 C)    General:  Alert and non-toxic, WDWN, NAD HEENT: NCAT, PERLA, EOM normal, no occular discharge or erythema.  Nasal mucosal edema with sinus tenderness to palpation.  Oropharynx clear with minimal oropharyngeal edema and erythema.  Mucous membranes moist and pink. Neck:  Cervical adenopathy Chest:  RRR no MRGs.  Lungs clear to auscultation A&P with no wheezes rhonchi or rales.   Abdomen: +BS x 4 quadrants, soft, non-tender, no guarding, rigidity, or rebound. Skin: warm and dry no rash Neuro: A&Ox4, CN II-XII grossly intact  Assessment and Plan:   1. Acute non-seasonal allergic rhinitis, unspecified trigger -astelin -finish remaining prednisone -cont zyrtec -cont flonase -phenergan dm as needed for cough and congestion

## 2017-02-04 ENCOUNTER — Ambulatory Visit (INDEPENDENT_AMBULATORY_CARE_PROVIDER_SITE_OTHER): Payer: Medicare Other | Admitting: Internal Medicine

## 2017-02-04 ENCOUNTER — Encounter: Payer: Self-pay | Admitting: Internal Medicine

## 2017-02-04 VITALS — BP 118/78 | HR 52 | Temp 97.5°F | Resp 16 | Ht 66.0 in | Wt 165.4 lb

## 2017-02-04 DIAGNOSIS — Z1212 Encounter for screening for malignant neoplasm of rectum: Secondary | ICD-10-CM

## 2017-02-04 DIAGNOSIS — Z0001 Encounter for general adult medical examination with abnormal findings: Secondary | ICD-10-CM

## 2017-02-04 DIAGNOSIS — F428 Other obsessive-compulsive disorder: Secondary | ICD-10-CM

## 2017-02-04 DIAGNOSIS — Z0000 Encounter for general adult medical examination without abnormal findings: Secondary | ICD-10-CM | POA: Diagnosis not present

## 2017-02-04 DIAGNOSIS — Z23 Encounter for immunization: Secondary | ICD-10-CM | POA: Diagnosis not present

## 2017-02-04 DIAGNOSIS — I10 Essential (primary) hypertension: Secondary | ICD-10-CM | POA: Diagnosis not present

## 2017-02-04 DIAGNOSIS — R7303 Prediabetes: Secondary | ICD-10-CM

## 2017-02-04 DIAGNOSIS — E890 Postprocedural hypothyroidism: Secondary | ICD-10-CM

## 2017-02-04 DIAGNOSIS — Z136 Encounter for screening for cardiovascular disorders: Secondary | ICD-10-CM

## 2017-02-04 DIAGNOSIS — E782 Mixed hyperlipidemia: Secondary | ICD-10-CM

## 2017-02-04 DIAGNOSIS — E559 Vitamin D deficiency, unspecified: Secondary | ICD-10-CM

## 2017-02-04 DIAGNOSIS — F429 Obsessive-compulsive disorder, unspecified: Secondary | ICD-10-CM | POA: Insufficient documentation

## 2017-02-04 DIAGNOSIS — Z79899 Other long term (current) drug therapy: Secondary | ICD-10-CM

## 2017-02-04 NOTE — Progress Notes (Signed)
[facility] [name], M.D.      Laura Maynard. [name], P.A.-[facility]                [address] [place], [place] [address] Telephone [phone] Telefax [phone]  Annual Screening/Preventative Visit & Comprehensive Evaluation &  Examination     This very nice 71 y.o. DBF presents for a Screening/Preventative Visit & comprehensive evaluation and management of multiple medical co-morbidities.  Patient has been followed for HTN, Prediabetes, Hyperlipidemia, Hypothyroidism and Vitamin D Deficiency. Patuient has hx/o poor medicine compliance due to a medicine phobia.      Labile HTN predates since 2012 and she has been followed expectantly.  Patient's BP has been controlled at home and patient denies any cardiac symptoms as chest pain, palpitations, shortness of breath, dizziness or ankle swelling. Today's BP is at goal -  118/78.      Patient's hyperlipidemia is controlled with diet and medications. Patient denies myalgias or other medication SE's. Last lipids were at goal with very high HLD 108: Lab Results  Component Value Date   CHOL 208 (H) 11/05/2016   HDL 108 11/05/2016   LDLCALC 83 11/05/2016   TRIG 84 11/05/2016   CHOLHDL 1.9 11/05/2016      Patient has prediabetes predating since 2012 with A1c 5.7% and patient denies reactive hypoglycemic symptoms, visual blurring, diabetic polys, or paresthesias. Last A1c was at goal: Lab Results  Component Value Date   HGBA1C 5.2 11/05/2016      Patient was treated by Dr Cruzita Lederer with I-131 for toxic MNG in Sept 2015 and patient reports compliance with her thyroid replacement therapy. Finally, patient has history of Vitamin D Deficiency of "10" in 2010 and her levels have been consistently low consequent of her poor compliance and last Vitamin D was again extremely low: Lab Results  Component Value Date   VD25OH 8 (L) 11/05/2016   Current  Outpatient Prescriptions on File Prior to Visit  Medication Sig  . aspirin 81 MG tablet Take 81 mg by mouth daily.    Marland Kitchen azelastine (ASTELIN) 0.1 % nasal spray Place 2 sprays into both nostrils 2 (two) times daily. Use in each nostril as directed  . butalbital-acetaminophen-caffeine (FIORICET, ESGIC) 50-325-40 MG tablet Take 1-2 tablets by mouth every 4 (four) hours as needed. Migraines/headaches  . levothyroxine (SYNTHROID, LEVOTHROID) 50 MCG tablet Take 1 tablet (50 mcg total) by mouth daily.  Marland Kitchen loratadine (CLARITIN) 10 MG tablet Take 10 mg by mouth daily as needed for allergies.   Marland Kitchen meclizine (ANTIVERT) 25 MG tablet Take 1 tablet 3 x d if needed for Dizziness / Vertigo  . naproxen sodium (ANAPROX) 220 MG tablet Take 440 mg by mouth 2 (two) times daily as needed (pain).  . rosuvastatin (CRESTOR) 10 MG tablet Take 1 tablet (10 mg total) by mouth at bedtime.  . [DISCONTINUED] amitriptyline (ELAVIL) 25 MG tablet 1-2 at night for headache (Patient not taking: Reported on 11/07/2015)   No current facility-administered medications on file prior to visit.    Allergies  Allergen Reactions  . Penicillins Hives  . Pravastatin     Myalgia  . Sulfa Antibiotics Hives  . Zetia [Ezetimibe]     Myalgia   Past Medical History:  Diagnosis Date  . Blind right eye    legally  . Hypertension   . Migraine   .  Prediabetes   . Sarcoidosis, lung ([place]) 15 YRS AGO   NO TREATMENTS  . 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 Vitamin D deficiency    Health Maintenance  Topic Date Due  . Hepatitis C Screening  [birth year]  . DEXA SCAN  01/07/2012  . COLONOSCOPY  03/27/2016  . PNA vac Low Risk Adult (2 of 2 - PPSV23) 10/15/2016  . INFLUENZA VACCINE  04/30/2017  . MAMMOGRAM  05/31/2018  . TETANUS/TDAP  10/15/2021   Immunization History  Administered Date(s) Administered  . Influenza Split 06/30/2013  . Influenza, High Dose Seasonal PF 06/26/2015, 07/25/2016  . Pneumococcal Conjugate-13 09/14/2014  .  Pneumococcal Polysaccharide-23 10/16/2011  . Td 10/16/2011  . Zoster 01/14/2007   Past Surgical History:  Procedure Laterality Date  . BACK SURGERY  1970  . COLONOSCOPY  2006; 03/28/11   2 small adenomas; normal  . EYE SURGERY  1964   right eye  . KNEE ARTHROSCOPY  2002  . NASAL SEPTUM SURGERY  1998  . TOTAL ABDOMINAL HYSTERECTOMY  2000   Family History  Problem Relation Age of Onset  . Lung cancer Sister 50  . Throat cancer Brother 22  . Colon cancer Neg Hx   . COPD Sister   . Diabetes Brother   . Stroke Mother   . Emphysema Father    Social History  Substance Use Topics  . Smoking status: Never Smoker  . Smokeless tobacco: Never Used  . Alcohol use No     Comment: RARE    ROS Constitutional: Denies fever, chills, weight loss/gain, headaches, insomnia,  night sweats, and change in appetite. Does c/o fatigue. Eyes: Denies redness, blurred vision, diplopia, discharge, itchy, watery eyes.  ENT: Denies discharge, congestion, post nasal drip, epistaxis, sore throat, earache, hearing loss, dental pain, Tinnitus, Vertigo, Sinus pain, snoring.  Cardio: Denies chest pain, palpitations, irregular heartbeat, syncope, dyspnea, diaphoresis, orthopnea, PND, claudication, edema Respiratory: denies cough, dyspnea, DOE, pleurisy, hoarseness, laryngitis, wheezing.  Gastrointestinal: Denies dysphagia, heartburn, reflux, water brash, pain, cramps, nausea, vomiting, bloating, diarrhea, constipation, hematemesis, melena, hematochezia, jaundice, hemorrhoids Genitourinary: Denies dysuria, frequency, urgency, nocturia, hesitancy, discharge, hematuria, flank pain Breast: Breast lumps, nipple discharge, bleeding.  Musculoskeletal: Denies arthralgia, myalgia, stiffness, Jt. Swelling, pain, limp, and strain/sprain. Denies falls. Skin: Denies puritis, rash, hives, warts, acne, eczema, changing in skin lesion Neuro: No weakness, tremor, incoordination, spasms, paresthesia, pain Psychiatric: Denies  confusion, memory loss, sensory loss. Denies Depression. Endocrine: Denies change in weight, skin, hair change, nocturia, and paresthesia, diabetic polys, visual blurring, hyper / hypo glycemic episodes.  Heme/Lymph: No excessive bleeding, bruising, enlarged lymph nodes.  Physical Exam  BP 118/78   Pulse (!) 52   Temp 97.5 F (36.4 C)   Resp 16   Ht 5\' 6"  (1.676 m)   Wt 165 lb 6.4 oz (75 kg)   BMI 26.70 kg/m   General Appearance: Well nourished, well groomed and in no apparent distress.  Eyes: PERRLA, EOMs, conjunctiva no swelling or erythema, normal fundi and vessels. Sinuses: No frontal/maxillary tenderness ENT/Mouth: EACs patent / TMs  nl. Nares clear without erythema, swelling, mucoid exudates. Oral hygiene is good. No erythema, swelling, or exudate. Tongue normal, non-obstructing. Tonsils not swollen or erythematous. Hearing normal.  Neck: Supple, thyroid normal. No bruits, nodes or JVD. Respiratory: Respiratory effort normal.  BS equal and clear bilateral without rales, rhonci, wheezing or stridor. Cardio: Heart sounds are normal with regular rate and rhythm and no murmurs, rubs or gallops. Peripheral pulses are normal and equal  bilaterally without edema. No aortic or femoral bruits. Chest: symmetric with normal excursions and percussion. Breasts: Symmetric, without lumps, nipple discharge, retractions, or fibrocystic changes.  Abdomen: Flat, soft with bowel sounds active. Nontender, no guarding, rebound, hernias, masses, or organomegaly.  Lymphatics: Non tender without lymphadenopathy.  Genitourinary:  Musculoskeletal: Full ROM all peripheral extremities, joint stability, 5/5 strength, and normal gait. Skin: Warm and dry without rashes, lesions, cyanosis, clubbing or  ecchymosis.  Neuro: Cranial nerves intact, reflexes equal bilaterally. Normal muscle tone, no cerebellar symptoms. Sensation intact.  Pysch: Alert and oriented X 3, normal affect, Insight and Judgment appropriate.    Assessment and Plan  1. Annual Preventative Screening Examination  2. Essential hypertension  - EKG 12-Lead - Urinalysis, Routine w reflex microscopic - Microalbumin / creatinine urine ratio - CBC with Differential/Platelet - BASIC METABOLIC PANEL WITH GFR - Magnesium  3. Hyperlipidemia, mixed  - Hepatic function panel - Lipid panel  4. Prediabetes  - Hemoglobin A1c - Insulin, random  5. Vitamin D deficiency  - VITAMIN D 25 Hydroxy   6. Postablative hypothyroidism  - TSH  7. Screening for rectal cancer  - POC Hemoccult Bld/Stl   8. Screening for ischemic heart disease   9. Medication management  - Urinalysis, Routine w reflex microscopic - Microalbumin / creatinine urine ratio - CBC with Differential/Platelet - BASIC METABOLIC PANEL WITH GFR - Hepatic function panel - Magnesium - Lipid panel - Hemoglobin A1c - Insulin, random - VITAMIN D 25 Hydroxy  - TSH  10. Need for prophylactic vaccination against Streptococcus pneumoniae (pneumococcus)  - Pneumococcal polysaccharide vaccine 23-valent greater than or equal to 2yo subcutaneous/IM       Patient was counseled in prudent diet to achieve/maintain BMI less than 25 for weight control, BP monitoring, regular exercise and medications. Discussed med's effects and SE's. Screening labs and tests as requested with regular follow-up as recommended. Over 40 minutes of exam, counseling, chart review and high complex critical decision making was performed.

## 2017-02-04 NOTE — Patient Instructions (Signed)

## 2017-02-05 ENCOUNTER — Other Ambulatory Visit: Payer: Self-pay | Admitting: Internal Medicine

## 2017-02-05 DIAGNOSIS — R319 Hematuria, unspecified: Secondary | ICD-10-CM

## 2017-02-05 LAB — HEPATIC FUNCTION PANEL
ALBUMIN: 4.4 g/dL (ref 3.6–5.1)
ALK PHOS: 82 U/L (ref 33–130)
ALT: 12 U/L (ref 6–29)
AST: 23 U/L (ref 10–35)
BILIRUBIN DIRECT: 0.1 mg/dL (ref <=0.2)
BILIRUBIN TOTAL: 0.3 mg/dL (ref 0.2–1.2)
Indirect Bilirubin: 0.2 mg/dL (ref 0.2–1.2)
Total Protein: 6.7 g/dL (ref 6.1–8.1)

## 2017-02-05 LAB — CBC WITH DIFFERENTIAL/PLATELET
BASOS PCT: 1 %
Basophils Absolute: 65 cells/uL (ref 0–200)
EOS ABS: 65 {cells}/uL (ref 15–500)
EOS PCT: 1 %
HCT: 35.3 % (ref 35.0–45.0)
Hemoglobin: 11.6 g/dL — ABNORMAL LOW (ref 11.7–15.5)
LYMPHS PCT: 30 %
Lymphs Abs: 1950 cells/uL (ref 850–3900)
MCH: 28 pg (ref 27.0–33.0)
MCHC: 32.9 g/dL (ref 32.0–36.0)
MCV: 85.1 fL (ref 80.0–100.0)
MONOS PCT: 8 %
MPV: 9 fL (ref 7.5–12.5)
Monocytes Absolute: 520 cells/uL (ref 200–950)
NEUTROS ABS: 3900 {cells}/uL (ref 1500–7800)
Neutrophils Relative %: 60 %
PLATELETS: 347 10*3/uL (ref 140–400)
RBC: 4.15 MIL/uL (ref 3.80–5.10)
RDW: 14.4 % (ref 11.0–15.0)
WBC: 6.5 10*3/uL (ref 3.8–10.8)

## 2017-02-05 LAB — MICROALBUMIN / CREATININE URINE RATIO
CREATININE, URINE: 143 mg/dL (ref 20–320)
MICROALB UR: 0.8 mg/dL
Microalb Creat Ratio: 6 mcg/mg creat (ref <30)

## 2017-02-05 LAB — URINALYSIS, ROUTINE W REFLEX MICROSCOPIC
BILIRUBIN URINE: NEGATIVE
Glucose, UA: NEGATIVE
KETONES UR: NEGATIVE
Leukocytes, UA: NEGATIVE
NITRITE: NEGATIVE
Protein, ur: NEGATIVE
Specific Gravity, Urine: 1.022 (ref 1.001–1.035)
pH: 6 (ref 5.0–8.0)

## 2017-02-05 LAB — BASIC METABOLIC PANEL WITH GFR
BUN: 10 mg/dL (ref 7–25)
CHLORIDE: 108 mmol/L (ref 98–110)
CO2: 25 mmol/L (ref 20–31)
Calcium: 9.1 mg/dL (ref 8.6–10.4)
Creat: 0.81 mg/dL (ref 0.60–0.93)
GFR, EST AFRICAN AMERICAN: 85 mL/min (ref >=60)
GFR, Est Non African American: 74 mL/min (ref >=60)
Glucose, Bld: 105 mg/dL — ABNORMAL HIGH (ref 65–99)
POTASSIUM: 3.8 mmol/L (ref 3.5–5.3)
SODIUM: 143 mmol/L (ref 135–146)

## 2017-02-05 LAB — URINALYSIS, MICROSCOPIC ONLY
BACTERIA UA: NONE SEEN [HPF]
Casts: NONE SEEN [LPF]
Crystals: NONE SEEN [HPF]
SQUAMOUS EPITHELIAL / LPF: NONE SEEN [HPF] (ref <=5)
WBC, UA: NONE SEEN WBC/HPF (ref <=5)
Yeast: NONE SEEN [HPF]

## 2017-02-05 LAB — LIPID PANEL
CHOL/HDL RATIO: 1.9 ratio (ref <5.0)
Cholesterol: 231 mg/dL — ABNORMAL HIGH (ref <200)
HDL: 121 mg/dL (ref >50)
LDL CALC: 97 mg/dL (ref <100)
TRIGLYCERIDES: 65 mg/dL (ref <150)
VLDL: 13 mg/dL (ref <30)

## 2017-02-05 LAB — TSH: TSH: 3.96 m[IU]/L

## 2017-02-05 LAB — HEMOGLOBIN A1C
Hgb A1c MFr Bld: 5.1 % (ref <5.7)
Mean Plasma Glucose: 100 mg/dL

## 2017-02-05 LAB — MAGNESIUM: MAGNESIUM: 2.3 mg/dL (ref 1.5–2.5)

## 2017-02-05 LAB — VITAMIN D 25 HYDROXY (VIT D DEFICIENCY, FRACTURES): VIT D 25 HYDROXY: 20 ng/mL — AB (ref 30–100)

## 2017-02-05 LAB — INSULIN, RANDOM: Insulin: 13.7 u[IU]/mL (ref 2.0–19.6)

## 2017-02-07 ENCOUNTER — Other Ambulatory Visit: Payer: Medicare Other

## 2017-02-07 DIAGNOSIS — R319 Hematuria, unspecified: Secondary | ICD-10-CM

## 2017-02-08 LAB — URINALYSIS, ROUTINE W REFLEX MICROSCOPIC
BILIRUBIN URINE: NEGATIVE
GLUCOSE, UA: NEGATIVE
KETONES UR: NEGATIVE
Leukocytes, UA: NEGATIVE
NITRITE: NEGATIVE
PH: 5 (ref 5.0–8.0)
Protein, ur: NEGATIVE
Specific Gravity, Urine: 1.012 (ref 1.001–1.035)

## 2017-02-08 LAB — URINALYSIS, MICROSCOPIC ONLY
Bacteria, UA: NONE SEEN [HPF]
CASTS: NONE SEEN [LPF]
CRYSTALS: NONE SEEN [HPF]
Squamous Epithelial / LPF: NONE SEEN [HPF] (ref <=5)
WBC, UA: NONE SEEN WBC/HPF (ref <=5)
YEAST: NONE SEEN [HPF]

## 2017-02-08 LAB — URINE CULTURE: Organism ID, Bacteria: NO GROWTH

## 2017-02-18 LAB — COLOGUARD

## 2017-02-25 ENCOUNTER — Ambulatory Visit (INDEPENDENT_AMBULATORY_CARE_PROVIDER_SITE_OTHER): Payer: Medicare Other | Admitting: Internal Medicine

## 2017-02-25 ENCOUNTER — Encounter: Payer: Self-pay | Admitting: Internal Medicine

## 2017-02-25 VITALS — BP 124/80 | HR 85 | Wt 164.0 lb

## 2017-02-25 DIAGNOSIS — E890 Postprocedural hypothyroidism: Secondary | ICD-10-CM

## 2017-02-25 DIAGNOSIS — E559 Vitamin D deficiency, unspecified: Secondary | ICD-10-CM | POA: Diagnosis not present

## 2017-02-25 NOTE — Progress Notes (Signed)
Patient ID: [name], female   DOB: 1946/07/31, 71 y.o.   MRN: [id number]   HPI  MASSA PE is a 70 y.o.-year-old female, returning for f/u for postablative hypothyroidism post toxic multinodular goiter (TMNG) RAI tx. Last visit 3 mo ago.  Reviewed hx: Pt had screening labs in 01/2014 and was found to have a low TSH. This was repeated 2x and the TSH continued to decrease.   Thyroid Uptake and scan (05/20/2014) >> uptake 48.5% and scan c/w TMNG.  We started MMI 5 mg bid.  Had RAI tx on 06/09/2014.   After RAI treatment, she developed post-ablative hypothyroidism >> we started levothyroxine 50 g daily. She developed leg cramps, which resolved after stopping levothyroxine but her TSH started to increase, so we change to Tirosint. Unfortunately, she could not afford this and we had to restart levothyroxine at the lower dose, subsequent to be increased back to 50 g daily (on 08/12/2016). She still experienced leg cramps after restart, but milder.   Pt is on levothyroxine 50 mcg daily, taken: - in am - fasting - >30 min from b'fast - no Ca, Fe, MVI, PPIs - not on Biotin  I reviewed pt's thyroid tests: Lab Results  Component Value Date   TSH 3.96 02/04/2017   TSH 3.77 11/05/2016   TSH 1.31 10/02/2016   TSH 11.77 (H) 08/12/2016   TSH 24.92 (H) 06/26/2016   TSH 3.46 04/22/2016   TSH 1.59 02/20/2016   TSH 0.16 (L) 01/16/2016   TSH 7.23 (H) 11/09/2015   TSH 3.53 08/31/2015   FREET4 0.87 10/02/2016   FREET4 0.65 08/12/2016   FREET4 0.44 (L) 06/26/2016   FREET4 0.79 02/20/2016   FREET4 0.83 11/09/2015   FREET4 0.77 08/31/2015   FREET4 1.05 07/06/2015   FREET4 1.01 05/31/2015   FREET4 0.90 04/12/2015   FREET4 0.69 12/14/2014    Pt denies: - feeling nodules in neck - hoarseness - dysphagia - choking - SOB with lying down  She c/o: - + leg cramps - still has full leg cramps, at night  She also has a history of HTN, HL, preDM, vit D def.,  anemia.  ROS: Constitutional: + weight gain, + fatigue, no subjective hyperthermia, no subjective hypothermia, + nocturia Eyes: no blurry vision, no xerophthalmia ENT: no sore throat, no nodules palpated in throat, no dysphagia, no odynophagia, no hoarseness Cardiovascular: no CP/no SOB/no palpitations/no leg swelling Respiratory: no cough/no SOB/no wheezing Gastrointestinal: no N/no V/no D/no C/no acid reflux Musculoskeletal: + muscle aches/ + joint aches Skin: no rashes, + hair loss Neurological: no tremors/no numbness/no tingling/no dizziness  I reviewed pt's medications, allergies, PMH, social hx, family hx, and changes were documented in the history of present illness. Otherwise, unchanged from my initial visit note.  PE: BP 124/80 (BP Location: Left Arm, Patient Position: Sitting)   Pulse 85   Wt 164 lb (74.4 kg)   SpO2 98%   BMI 26.47 kg/m  Body mass index is 26.47 kg/m. Wt Readings from Last 3 Encounters:  02/25/17 164 lb (74.4 kg)  02/04/17 165 lb 6.4 oz (75 kg)  11/15/16 161 lb (73 kg)   Constitutional: normal weight, in NAD Eyes: surgical pupil R eye, EOMI, no exophthalmos ENT: moist mucous membranes, no thyromegaly, no cervical lymphadenopathy Cardiovascular: RRR, No MRG Respiratory: CTA B Gastrointestinal: abdomen soft, NT, ND, BS+ Musculoskeletal: no deformities, strength intact in all 4 Skin: moist, warm, no rashes Neurological: no tremor with outstretched hands, DTR normal in all 4  ASSESSMENT: 1. Hypothyroidism after RAI tx   2. H/o Toxic MNG  3. vitamin D deficiency  PLAN:  1. And 2.  Patient with history of thyrotoxicosis due to toxic multinodular goiter, previously with some thyrotoxic symptoms: Weight loss, anxiety, insomnia, now all resolved after RAI treatment. She developed hypothyroidism after the treatment and is now on levothyroxine 50 g daily. She initially had severe leg cramps so we had to back off the dose of levothyroxine. We also tried  Tirosint, but this was too expensive. She still has leg cramps, but was also found recently to have a very low vitamin D and she is reticent to start supplementation. I explained that levothyroxine and can all potentiate leg cramps in the presence of low vitamin D. I advised her to start at least 5000 g Units of vitamin D daily. I will have her back in 2 months for recheck. At that time, we'll also check TFTs and may need to increase the levothyroxine dose if the TSH is still the upper limit of normal - will check TFTs in 2 months - I will see her back in 6 months  3.  vitamin D deficiency  - Start 5000 units daily and check level in 2 months    Philemon Kingdom, MD PhD [facility]

## 2017-02-25 NOTE — Patient Instructions (Addendum)
Please start 5000 units vitamin D daily.  Continue Levothyroxine 50 mcg daily.  Take the thyroid hormone every day, with water, at least 30 minutes before breakfast, separated by at least 4 hours from: - acid reflux medications - calcium - iron - multivitamins  Please come back for labs in 2 months, and for an appt in 6 months.

## 2017-03-13 ENCOUNTER — Encounter: Payer: Self-pay | Admitting: Internal Medicine

## 2017-04-28 ENCOUNTER — Other Ambulatory Visit: Payer: Self-pay

## 2017-04-28 ENCOUNTER — Other Ambulatory Visit (INDEPENDENT_AMBULATORY_CARE_PROVIDER_SITE_OTHER): Payer: Medicare Other

## 2017-04-28 ENCOUNTER — Telehealth: Payer: Self-pay

## 2017-04-28 DIAGNOSIS — E890 Postprocedural hypothyroidism: Secondary | ICD-10-CM

## 2017-04-28 DIAGNOSIS — E559 Vitamin D deficiency, unspecified: Secondary | ICD-10-CM

## 2017-04-28 LAB — VITAMIN D 25 HYDROXY (VIT D DEFICIENCY, FRACTURES): VITD: 23.88 ng/mL — ABNORMAL LOW (ref 30.00–100.00)

## 2017-04-28 LAB — T4, FREE: Free T4: 0.91 ng/dL (ref 0.60–1.60)

## 2017-04-28 LAB — TSH: TSH: 5.51 u[IU]/mL — AB (ref 0.35–4.50)

## 2017-04-28 MED ORDER — LEVOTHYROXINE SODIUM 75 MCG PO TABS: 75.0000 ug | ORAL_TABLET | Freq: Every day | ORAL | 3 refills | Status: DC

## 2017-04-28 NOTE — Telephone Encounter (Signed)
-----   Message from Philemon Kingdom, MD sent at 04/28/2017  4:10 PM EDT ----- Almyra Free, can you please call pt: TSH is slightly high, so we'll need to increase the dose of her levothyroxine to 75 g daily. She needs to return for a TSH and a free T4 in 2 months (can you please order?) Her vitamin D level is still low. Did she miss any vitamin D doses? If not, can she increased the dose to 7000 units daily and get another level when she comes back in 2 months  (can you please order?)

## 2017-04-28 NOTE — Telephone Encounter (Signed)
LVM, gave lab results. Gave call back number if any questions or concerns.  

## 2017-04-29 ENCOUNTER — Telehealth: Payer: Self-pay

## 2017-04-29 NOTE — Telephone Encounter (Signed)
Dustin, What do you mean by this message?

## 2017-04-29 NOTE — Telephone Encounter (Signed)
Called and LVM advising patient that it was okay to take one pill and a half in order to equal 75 mcg. Left call back number if any questions.

## 2017-04-29 NOTE — Telephone Encounter (Signed)
Patient called back expressing frustration. States that she has to go to the hospital bc she has patients. Advised that per Almyra Free, Dr Cruzita Lederer has been in a room all morning and has not had a chance to respond. She stated ok to leave vm. Patient does not have a cell phone.

## 2017-04-29 NOTE — Telephone Encounter (Signed)
Patient called in states that she has the 50 mcg levothyroxine pills at home, and wants to know if she can take one pill and half another so she does not waste the ones she has.

## 2017-05-02 ENCOUNTER — Other Ambulatory Visit: Payer: Self-pay | Admitting: *Deleted

## 2017-05-02 DIAGNOSIS — R42 Dizziness and giddiness: Secondary | ICD-10-CM

## 2017-05-02 MED ORDER — MECLIZINE HCL 25 MG PO TABS: ORAL_TABLET | ORAL | 1 refills | Status: DC

## 2017-05-22 NOTE — Progress Notes (Signed)
3 MONTH FOLLOW UP  Assessment and Plan:   Essential hypertension - continue medications, DASH diet, exercise and monitor at home. Call if greater than 130/80.  - CBC with Differential - BASIC METABOLIC PANEL WITH GFR - Hepatic function panel  Toxic multinodular goiter Continue follow up with Dr. Darnell Level Getting TSH checked there  Prediabetes Discussed general issues about diabetes pathophysiology and management., Educational material distributed., Suggested low cholesterol diet., Encouraged aerobic exercise., Discussed foot care., Reminded to get yearly retinal exam. - Hemoglobin A1c  Vitamin D deficiency - Vit D  25 hydroxy (rtn osteoporosis monitoring) Try the wafer/liquid but lesser dose, has HA/nauses with vitamin D pill   Hyperlipidemia -continue medications, check lipids, decrease fatty foods, increase activity.  - Lipid panel   Medication management - Magnesium  Future Appointments Date Time Provider [place]  07/29/2017 10:15 AM Elayne Snare, MD [facility] None  08/27/2017 9:30 AM [name], MD [facility] None  08/28/2017 9:30 AM Philemon Kingdom, MD [facility] None  03/04/2018 2:00 PM [name], MD [facility] None    Subjective:   Laura Maynard is a 71 y.o. AAF who presents for 3 month follow up on hypertension, prediabetes, hyperlipidemia, vitamin D def.    Her blood pressure has been controlled at home, today their BP is BP: 110/74 She does workout when she can, goes rec center 2 x week. She denies chest pain, shortness of breath, dizziness.  She has more leaking incontinence, stress incontinence.  She is on cholesterol medication, on crestor 2 x a week and denies myalgias. Her cholesterol is at goal. The cholesterol last visit was:   Lab Results  Component Value Date   CHOL 231 (H) 02/04/2017   HDL 121 02/04/2017   LDLCALC 97 02/04/2017   TRIG 65 02/04/2017   CHOLHDL 1.9 02/04/2017   She has been working on diet and exercise for  prediabetes, and denies polydipsia, polyuria and visual disturbances. Last A1C in the office was:  Lab Results  Component Value Date   HGBA1C 5.1 02/04/2017   Patient is on Vitamin D supplement. Lab Results  Component Value Date   VD25OH 23.88 (L) 04/28/2017     She had low TSH due to toxic MNG s/p RAI tx and follows with Dr. Cruzita Lederer, was increase to 75mg  daily.    Lab Results  Component Value Date   TSH 5.51 (H) 04/28/2017   Had a diagnosis of sarcoid in 1994 however most recent CT of chest did not show any evidence of it.  BMI is Body mass index is 26.15 kg/m., she is working on diet and exercise. Wt Readings from Last 3 Encounters:  05/26/17 162 lb (73.5 kg)  02/25/17 164 lb (74.4 kg)  02/04/17 165 lb 6.4 oz (75 kg)     Medication Review Current Outpatient Prescriptions on File Prior to Visit  Medication Sig Dispense Refill  . aspirin 81 MG tablet Take 81 mg by mouth daily.      Marland Kitchen azelastine (ASTELIN) 0.1 % nasal spray Place 2 sprays into both nostrils 2 (two) times daily. Use in each nostril as directed 30 mL 2  . butalbital-acetaminophen-caffeine (FIORICET, ESGIC) 50-325-40 MG tablet Take 1-2 tablets by mouth every 4 (four) hours as needed. Migraines/headaches 60 tablet 0  . levothyroxine (SYNTHROID, LEVOTHROID) 75 MCG tablet Take 1 tablet (75 mcg total) by mouth daily. 90 tablet 3  . loratadine (CLARITIN) 10 MG tablet Take 10 mg by mouth daily as needed for allergies.   2  .  meclizine (ANTIVERT) 25 MG tablet Take 1 tablet 3 x d if needed for Dizziness / Vertigo 100 tablet 1  . naproxen sodium (ANAPROX) 220 MG tablet Take 440 mg by mouth 2 (two) times daily as needed (pain).    . rosuvastatin (CRESTOR) 10 MG tablet Take 1 tablet (10 mg total) by mouth at bedtime. 30 tablet 3  . [DISCONTINUED] amitriptyline (ELAVIL) 25 MG tablet 1-2 at night for headache (Patient not taking: Reported on 11/07/2015) 60 tablet 0   No current facility-administered medications on file prior to  visit.     Current Problems (verified) Patient Active Problem List   Diagnosis Date Noted  . Medication Phobia 02/04/2017  . BMI 25.28,  adult 07/27/2015  . Postablative hypothyroidism 07/06/2015  . Benign paroxysmal positional vertigo 09/14/2014  . TMJ (temporomandibular joint syndrome) 09/14/2014  . Poor compliance with medication 05/29/2014  . Hyperlipidemia 02/08/2014  . Hypertension   . Prediabetes   . Vitamin D deficiency   . Chorioretinal scar, 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   Allergies  Allergen Reactions  . Penicillins Hives  . Pravastatin     Myalgia  . Sulfa Antibiotics Hives  . Zetia [Ezetimibe]     Myalgia   Surgical History: reviewed and unchanged Family History: reviewed and unchanged Social History: reviewed and unchanged  Review of Systems  Constitutional: Negative.   HENT: Negative.   Eyes: Negative.   Respiratory: Negative.   Cardiovascular: Negative.   Gastrointestinal: Negative.   Genitourinary: Negative.   Musculoskeletal: Negative.   Skin: Negative.      Objective:   Blood pressure 110/74, pulse 82, temperature (!) 97.5 F (36.4 C), resp. rate 14, height 5\' 6"  (1.676 m), weight 162 lb (73.5 kg), SpO2 98 %. Body mass index is 26.15 kg/m.  General appearance: alert, no distress, WD/WN,  female HEENT: normocephalic, sclerae anicteric, TMs pearly, nares patent, no discharge or erythema, pharynx normal, + TMJ tenderness, + maxillary sinus tenderness.  Oral cavity: MMM, no lesions Neck: supple, no lymphadenopathy, no thyromegaly, no masses Heart: RRR, normal S1, S2, no murmurs Lungs: CTA bilaterally, no wheezes, rhonchi, or rales Abdomen: +bs, soft, non tender, non distended, no masses, no hepatomegaly, no splenomegaly Musculoskeletal: nontender, no swelling, no obvious deformity Extremities: no edema, no cyanosis, no clubbing Pulses: 2+ symmetric, upper and lower extremities, normal cap refill Neurological: alert, oriented x 3, CN2-12 intact,  strength normal upper extremities and lower extremities, sensation normal throughout, DTRs 2+ throughout, no cerebellar signs, gait normal Psychiatric: normal affect, behavior normal, pleasant    Vicie Mutters, PA-C   05/26/2017

## 2017-05-26 ENCOUNTER — Ambulatory Visit (INDEPENDENT_AMBULATORY_CARE_PROVIDER_SITE_OTHER): Payer: Medicare Other | Admitting: Physician Assistant

## 2017-05-26 ENCOUNTER — Encounter: Payer: Self-pay | Admitting: Physician Assistant

## 2017-05-26 VITALS — BP 110/74 | HR 82 | Temp 97.5°F | Resp 14 | Ht 66.0 in | Wt 162.0 lb

## 2017-05-26 DIAGNOSIS — E782 Mixed hyperlipidemia: Secondary | ICD-10-CM

## 2017-05-26 DIAGNOSIS — I10 Essential (primary) hypertension: Secondary | ICD-10-CM | POA: Diagnosis not present

## 2017-05-26 DIAGNOSIS — E890 Postprocedural hypothyroidism: Secondary | ICD-10-CM

## 2017-05-26 DIAGNOSIS — R7303 Prediabetes: Secondary | ICD-10-CM | POA: Diagnosis not present

## 2017-05-26 DIAGNOSIS — Z23 Encounter for immunization: Secondary | ICD-10-CM

## 2017-05-26 NOTE — Patient Instructions (Addendum)
  Vitamin D goal is between 60-80  Please make sure that you are taking your Vitamin D as directed.   It is very important as a natural anti-inflammatory   helping hair, skin, and nails, as well as reducing stroke and heart attack risk.   It helps your bones and helps with mood.  We want you on at least 5000 IU daily  It also decreases numerous cancer risks so please take it as directed.   Low Vit D is associated with a 200-300% higher risk for CANCER   and 200-300% higher risk for HEART   ATTACK  &  STROKE.    .....................................Marland Kitchen  It is also associated with higher death rate at younger ages,   autoimmune diseases like Rheumatoid arthritis, Lupus, Multiple Sclerosis.     Also many other serious conditions, like depression, Alzheimer's  Dementia, infertility, muscle aches, fatigue, fibromyalgia - just to name a few.  +++++++++++++++++++  Can get liquid vitamin D from [facility] here in [place] at  [facility] alternatives [address] [place], [place] [address] Or you can try earth fare   Try to pee every 2 hours Do not drink liquids 2 hours before bed    Suggest seeing GYN

## 2017-05-27 NOTE — Progress Notes (Signed)
LVM for pt to return office call for LAB results.

## 2017-05-28 NOTE — Progress Notes (Signed)
Pt aware of lab results & voiced understanding of those results.

## 2017-05-29 ENCOUNTER — Telehealth: Payer: Self-pay | Admitting: Internal Medicine

## 2017-05-29 LAB — BASIC METABOLIC PANEL WITH GFR
BUN: 11 mg/dL (ref 7–25)
CO2: 30 mmol/L (ref 20–32)
CREATININE: 0.83 mg/dL (ref 0.60–0.93)
Calcium: 9.2 mg/dL (ref 8.6–10.4)
Chloride: 108 mmol/L (ref 98–110)
GFR, EST AFRICAN AMERICAN: 83 mL/min/{1.73_m2} (ref >=60)
GFR, EST NON AFRICAN AMERICAN: 71 mL/min/{1.73_m2} (ref >=60)
Glucose, Bld: 92 mg/dL (ref 65–99)
Potassium: 4.4 mmol/L (ref 3.5–5.3)
Sodium: 142 mmol/L (ref 135–146)

## 2017-05-29 LAB — CBC WITH DIFFERENTIAL/PLATELET
BASOS ABS: 51 {cells}/uL (ref 0–200)
Basophils Relative: 1.1 %
EOS ABS: 69 {cells}/uL (ref 15–500)
Eosinophils Relative: 1.5 %
HCT: 34.6 % — ABNORMAL LOW (ref 35.0–45.0)
HEMOGLOBIN: 11.3 g/dL — AB (ref 11.7–15.5)
LYMPHS ABS: 1495 {cells}/uL (ref 850–3900)
MCH: 27.2 pg (ref 27.0–33.0)
MCHC: 32.7 g/dL (ref 32.0–36.0)
MCV: 83.4 fL (ref 80.0–100.0)
MPV: 9.6 fL (ref 7.5–12.5)
Monocytes Relative: 9.3 %
NEUTROS ABS: 2558 {cells}/uL (ref 1500–7800)
NEUTROS PCT: 55.6 %
Platelets: 349 10*3/uL (ref 140–400)
RBC: 4.15 10*6/uL (ref 3.80–5.10)
RDW: 13.1 % (ref 11.0–15.0)
Total Lymphocyte: 32.5 %
WBC mixed population: 428 cells/uL (ref 200–950)
WBC: 4.6 10*3/uL (ref 3.8–10.8)

## 2017-05-29 LAB — HEPATIC FUNCTION PANEL
AG RATIO: 1.7 (calc) (ref 1.0–2.5)
ALBUMIN MSPROF: 4.1 g/dL (ref 3.6–5.1)
ALT: 15 U/L (ref 6–29)
AST: 22 U/L (ref 10–35)
Alkaline phosphatase (APISO): 78 U/L (ref 33–130)
BILIRUBIN INDIRECT: 0.3 mg/dL (ref 0.2–1.2)
Bilirubin, Direct: 0.1 mg/dL (ref 0.0–0.2)
GLOBULIN: 2.4 g/dL (ref 1.9–3.7)
TOTAL PROTEIN: 6.5 g/dL (ref 6.1–8.1)
Total Bilirubin: 0.4 mg/dL (ref 0.2–1.2)

## 2017-05-29 LAB — LIPID PANEL
CHOL/HDL RATIO: 2.1 (calc) (ref <5.0)
Cholesterol: 238 mg/dL — ABNORMAL HIGH (ref <200)
HDL: 115 mg/dL (ref >50)
LDL CHOLESTEROL (CALC): 109 mg/dL — AB
NON-HDL CHOLESTEROL (CALC): 123 mg/dL (ref <130)
TRIGLYCERIDES: 48 mg/dL (ref <150)

## 2017-05-29 NOTE — Telephone Encounter (Signed)
For me, she is due for thyroid tests and vitamin D in another month.

## 2017-05-29 NOTE — Telephone Encounter (Signed)
Patient scheduled for 06/23/2017 for labs. No other information to give at this time.

## 2017-05-29 NOTE — Telephone Encounter (Signed)
Patient received a call for her to schedule another lab visit. Patient is scheduled. Call patient to advise if this is not what she was supposed to do.

## 2017-05-29 NOTE — Telephone Encounter (Signed)
Routing to you °

## 2017-05-29 NOTE — Telephone Encounter (Signed)
See message. I could not locate any new information to relay to the patient? Thanks!

## 2017-06-16 ENCOUNTER — Telehealth: Payer: Self-pay | Admitting: Internal Medicine

## 2017-06-16 NOTE — Telephone Encounter (Signed)
Patient states that she recently increased her levothyroxine was increased 75 mcg. She has a lot of itching that started about 2 weeks ago and really increased today. She is wondering if this is an effect of the medication.

## 2017-06-16 NOTE — Telephone Encounter (Signed)
It could be from the dye in the 75 g tablet. She could take 1.5 tablets of the 50 g, which does not have any dyes. She may need a refill of the 50 g tab. Also, she has an appointment with Dr. Dwyane Dee on 07/29/2017, then one with me in 07/2017. I think we should probably canceled along with Dr. Dwyane Dee, but please check with her to see if she wanted to change providers.

## 2017-06-17 ENCOUNTER — Other Ambulatory Visit: Payer: Self-pay

## 2017-06-17 MED ORDER — LEVOTHYROXINE SODIUM 50 MCG PO TABS: ORAL_TABLET | ORAL | 3 refills | Status: DC

## 2017-06-17 NOTE — Telephone Encounter (Signed)
Called patient and advised to take the 50 mcg dose tablet 1 and 1/2 tablet daily to equal 75 mcg. Patient understood, asked what to take for the itching I recommended a benadryl and to half it if it makes her sleepy until the dye gets out of her system. Patient understood and had no questions at this time.

## 2017-06-17 NOTE — Telephone Encounter (Signed)
Called patient and advised to take the 50 mcg dose tablet 1 and 1/2 tablet daily to equal 75 mcg. Patient understood, asked what to take for the itching I recommended a benadryl and to half it if it makes her sleepy until the dye gets out of her system. Patient understood and had no questions at this time. Also cancelling appointment with Dr.Kumar as it was placed by mistake.

## 2017-06-19 ENCOUNTER — Ambulatory Visit (INDEPENDENT_AMBULATORY_CARE_PROVIDER_SITE_OTHER): Payer: Medicare Other | Admitting: Internal Medicine

## 2017-06-19 VITALS — BP 118/74 | HR 76 | Temp 97.3°F | Resp 16 | Ht 66.0 in | Wt 162.0 lb

## 2017-06-19 DIAGNOSIS — L299 Pruritus, unspecified: Secondary | ICD-10-CM | POA: Diagnosis not present

## 2017-06-19 NOTE — Patient Instructions (Signed)
Pruritus  Pruritus is an itching feeling. There are many different conditions and factors that can make your skin itchy. Dry skin is one of the most common causes of itching. Most cases of itching do not require medical attention. Itchy skin can turn into a rash.  Follow these instructions at home:  Watch your pruritus for any changes. Take these steps to help with your condition:  Skin Care  · Moisturize your skin as needed. A moisturizer that contains petroleum jelly is best for keeping moisture in your skin.  · Take or apply medicines only as directed by your health care provider. This may include:  ? Corticosteroid cream.  ? Anti-itch lotions.  ? Oral anti-histamines.  · Apply cool compresses to the affected areas.  · Try taking a bath with:  ? Epsom salts. Follow the instructions on the packaging. You can get these at your local pharmacy or grocery store.  ? Baking soda. Pour a small amount into the bath as directed by your health care provider.  ? Colloidal oatmeal. Follow the instructions on the packaging. You can get this at your local pharmacy or grocery store.  · Try applying baking soda paste to your skin. Stir water into baking soda until it reaches a paste-like consistency.  · Do not scratch your skin.  · Avoid hot showers or baths, which can make itching worse. A cold shower may help with itching as long as you use a moisturizer after.  · Avoid scented soaps, detergents, and perfumes. Use gentle soaps, detergents, perfumes, and other cosmetic products.  General instructions  · Avoid wearing tight clothes.  · Keep a journal to help track what causes your itch. Write down:  ? What you eat.  ? What cosmetic products you use.  ? What you drink.  ? What you wear. This includes jewelry.  · Use a humidifier. This keeps the air moist, which helps to prevent dry skin.  Contact a health care provider if:  · The itching does not go away after several days.  · You sweat at night.  · You have weight loss.  · You  are unusually thirsty.  · You urinate more than normal.  · You are more tired than normal.  · You have abdominal pain.  · Your skin tingles.  · You feel weak.  · Your skin or the whites of your eyes look yellow (jaundice).  · Your skin feels numb.  This information is not intended to replace advice given to you by your health care provider. Make sure you discuss any questions you have with your health care provider.  Document Released: 05/29/2011 Document Revised: 02/22/2016 Document Reviewed: 09/12/2014  Elsevier Interactive Patient Education © 2018 [employer]vier Inc[employer]

## 2017-06-21 ENCOUNTER — Encounter: Payer: Self-pay | Admitting: Internal Medicine

## 2017-06-21 MED ORDER — BUTALBITAL-APAP-CAFFEINE 50-325-40 MG PO TABS: 1.0000 | ORAL_TABLET | ORAL | 0 refills | Status: DC | PRN

## 2017-06-21 MED ORDER — PREDNISONE 20 MG PO TABS: ORAL_TABLET | ORAL | 0 refills | Status: DC

## 2017-06-21 NOTE — Progress Notes (Signed)
   Subjective:    Patient ID: Laura Maynard, female    DOB: Jul 07, 1946, 71 y.o.   MRN: [id number]  HPI  Patient c/o generalized itching w/o rash evident. Denies any recent meds, or exposures, etc.   Medication Sig  . aspirin 81 MG tablet Take 81 mg by mouth daily.    Marland Kitchen azelastine (ASTELIN) 0.1 % nasal spray Place 2 sprays into both nostrils 2 (two) times daily. Use in each nostril as directed  . butalbital-acetaminophen-caffeine (FIORICET, ESGIC) 50-325-40 MG tablet Take 1-2 tablets by mouth every 4 (four) hours as needed. Migraines/headaches  . levothyroxine (SYNTHROID, LEVOTHROID) 50 MCG tablet Please take 1 and 1/2 tablet (equal 75 mcg) daily.  Marland Kitchen loratadine (CLARITIN) 10 MG tablet Take 10 mg by mouth daily as needed for allergies.   Marland Kitchen meclizine (ANTIVERT) 25 MG tablet Take 1 tablet 3 x d if needed for Dizziness / Vertigo  . naproxen sodium (ANAPROX) 220 MG tablet Take 440 mg by mouth 2 (two) times daily as needed (pain).  . rosuvastatin (CRESTOR) 10 MG tablet Take 1 tablet (10 mg total) by mouth at bedtime.   Allergies  Allergen Reactions  . Penicillins Hives  . Pravastatin     Myalgia  . Sulfa Antibiotics Hives  . Zetia [Ezetimibe]     Myalgia    Past Medical History:  Diagnosis Date  . Blind right eye    legally  . Hypertension   . Migraine   . Prediabetes   . Sarcoidosis, lung ([place]) 15 YRS AGO   NO TREATMENTS  . 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 Vitamin D deficiency      Review of Systems  10 point systems review negative except as above.    Objective:   Physical Exam  BP 118/74   Pulse 76   Temp (!) 97.3 F (36.3 C)   Resp 16   Ht 5\' 6"  (1.676 m)   Wt 162 lb (73.5 kg)   BMI 26.15 kg/m   HEENT - WNL. Neck - supple.  Chest - Clear equal BS. Cor - Nl HS. RRR w/o sig MGR. PP 1(+). No edema. MS- FROM w/o deformities.  Gait Nl. Neuro -  Nl w/o focal abnormalities.  Skin - No rash is evident except 1 possible hive if the RUE     Assessment & Plan:    1. Pruritus  - Recc use Cetirizine in lieu of Loratadine  - Rx Prednisone 20 mg #20 tabs - taper

## 2017-06-23 ENCOUNTER — Other Ambulatory Visit (INDEPENDENT_AMBULATORY_CARE_PROVIDER_SITE_OTHER): Payer: Medicare Other

## 2017-06-23 DIAGNOSIS — E559 Vitamin D deficiency, unspecified: Secondary | ICD-10-CM | POA: Diagnosis not present

## 2017-06-23 DIAGNOSIS — E890 Postprocedural hypothyroidism: Secondary | ICD-10-CM

## 2017-06-23 LAB — VITAMIN D 25 HYDROXY (VIT D DEFICIENCY, FRACTURES): VITD: 16.8 ng/mL — ABNORMAL LOW (ref 30.00–100.00)

## 2017-06-23 LAB — T4, FREE: Free T4: 1.11 ng/dL (ref 0.60–1.60)

## 2017-06-23 LAB — TSH: TSH: 0.16 u[IU]/mL — AB (ref 0.35–4.50)

## 2017-06-24 ENCOUNTER — Telehealth: Payer: Self-pay

## 2017-06-24 NOTE — Telephone Encounter (Signed)
Called patient and gave lab results. Patient has not been taking the vitamin D, because of the itching, they were trying to see what could be causing this so patient stoped taking everything but the thyroid med, prednisone, and now zyrtec. Patient was very upset on the phone regarding the itching. I advised that we just changed her last week, and gave tips on how to take care of her skin while the itching is there. Patient then was very thankful. Wanted you to be aware.

## 2017-06-24 NOTE — Telephone Encounter (Signed)
-----   Message from Philemon Kingdom, MD sent at 06/23/2017  5:05 PM EDT ----- Almyra Free, can you please call pt: TSH is low, however, she just started Prednisone, which can artificially lower her TSH >> we need to wait until next visit and recheck. Vit D is lower than at last check despite increasing the dose to 7000 units daily  - did she miss any doses? If not, may need to increase the dose to 10,000 units daily. Will recheck when she comes back in 07/2017.

## 2017-06-24 NOTE — Telephone Encounter (Signed)
Called and advised patient of which brand to get for the Vitamin D medication. Patient understood and would go get that. Patient will begin to take the 7000 units again, and we will see if it gets better; she was nervous to jump to 10,000 units.

## 2017-06-26 ENCOUNTER — Telehealth: Payer: Self-pay | Admitting: *Deleted

## 2017-06-26 MED ORDER — CIPROFLOXACIN HCL 250 MG PO TABS: 250.0000 mg | ORAL_TABLET | Freq: Two times a day (BID) | ORAL | 0 refills | Status: AC

## 2017-06-26 NOTE — Telephone Encounter (Signed)
Patient called and states she started having urinary frequency and pressure on 06/25/2017.  She also states she is taking Zyrtec and Prednisone from her 06/19/2017 ov and her itching has improved.  Per Dr Melford Aase, send in an RX for Cipro 250 mg.  Patient is aware.

## 2017-07-29 ENCOUNTER — Ambulatory Visit: Payer: Medicare Other | Admitting: Endocrinology

## 2017-08-26 NOTE — Progress Notes (Deleted)
FOLLOW UP  Assessment and Plan:   Hypertension Well controlled with current medications  Monitor blood pressure at home; patient to call if consistently greater than 130/80 Continue DASH diet.   Reminder to go to the ER if any CP, SOB, nausea, dizziness, severe HA, changes vision/speech, left arm numbness and tingling and jaw pain.  Cholesterol Well contorlled; continue medication  Continue low cholesterol diet and exercise.  Check lipid panel.   History of prediabetes Recent A1Cs well controlled in normal range; defer checking A1C today Continue diet and exercise.   Vitamin D Def/ osteoporosis prevention Continue supplementation Check Vit D level  Hypothyroidism Managed by Dr. Cruzita Lederer; levels monitored here  reminded to take on an empty stomach 30-25mins before food.  check TSH level  Continue diet and meds as discussed. Further disposition pending results of labs. Discussed med's effects and SE's.   Over 30 minutes of exam, counseling, chart review, and critical decision making was performed.   Future Appointments  Date Time Provider [place]  08/27/2017  9:30 AM Liane Comber, NP [facility] None  08/28/2017  9:30 AM Philemon Kingdom, MD [facility] None  03/04/2018  2:00 PM [name], MD [facility] None    ----------------------------------------------------------------------------------------------------------------------  HPI 71 y.o. female  presents for 3 month follow up on hypertension, cholesterol, diabetes, weight and vitamin D deficiency.   Not on vitamin D?  BMI is There is no height or weight on file to calculate BMI., she {HAS HAS BZJ:69678} been working on diet and exercise. Wt Readings from Last 3 Encounters:  06/19/17 162 lb (73.5 kg)  05/26/17 162 lb (73.5 kg)  02/25/17 164 lb (74.4 kg)   Her blood pressure {HAS HAS NOT:18834} been controlled at home, today their BP is    She {DOES_DOES LFY:10175} workout. She denies chest pain,  shortness of breath, dizziness.   She is on cholesterol medication and denies myalgias. Her cholesterol is at goal. The cholesterol last visit was:   Lab Results  Component Value Date   CHOL 238 (H) 05/26/2017   HDL 115 05/26/2017   LDLCALC 97 02/04/2017   TRIG 48 05/26/2017   CHOLHDL 2.1 05/26/2017    She {Has/has not:18111} been working on diet and exercise for prediabetes, and denies {Symptoms; diabetes w/o none:19199}. Last A1C in the office was:  Lab Results  Component Value Date   HGBA1C 5.1 02/04/2017   Patient is on Vitamin D supplement.   Lab Results  Component Value Date   VD25OH 16.80 (L) 06/23/2017     She is on thyroid medication. Her medication was changed since last visit by Dr. Cruzita Lederer   Lab Results  Component Value Date   TSH 0.16 (L) 06/23/2017  .  Current Medications:  Current Outpatient Medications on File Prior to Visit  Medication Sig  . aspirin 81 MG tablet Take 81 mg by mouth daily.    Marland Kitchen azelastine (ASTELIN) 0.1 % nasal spray Place 2 sprays into both nostrils 2 (two) times daily. Use in each nostril as directed  . butalbital-acetaminophen-caffeine (FIORICET, ESGIC) 50-325-40 MG tablet Take 1-2 tablets by mouth every 4 (four) hours as needed. Migraines/headaches  . levothyroxine (SYNTHROID, LEVOTHROID) 50 MCG tablet Please take 1 and 1/2 tablet (equal 75 mcg) daily.  Marland Kitchen loratadine (CLARITIN) 10 MG tablet Take 10 mg by mouth daily as needed for allergies.   Marland Kitchen meclizine (ANTIVERT) 25 MG tablet Take 1 tablet 3 x d if needed for Dizziness / Vertigo  . naproxen sodium (ANAPROX) 220 MG  tablet Take 440 mg by mouth 2 (two) times daily as needed (pain).  . predniSONE (DELTASONE) 20 MG tablet 1 tab 3 x day for 3 days, then 1 tab 2 x day for 3 days, then 1 tab 1 x day for 5 days  . rosuvastatin (CRESTOR) 10 MG tablet Take 1 tablet (10 mg total) by mouth at bedtime.  . [DISCONTINUED] amitriptyline (ELAVIL) 25 MG tablet 1-2 at night for headache (Patient not taking:  Reported on 11/07/2015)   No current facility-administered medications on file prior to visit.      Allergies:  Allergies  Allergen Reactions  . Penicillins Hives  . Pravastatin     Myalgia  . Sulfa Antibiotics Hives  . Zetia [Ezetimibe]     Myalgia     Medical History:  Past Medical History:  Diagnosis Date  . Blind right eye    legally  . Hypertension   . Migraine   . Prediabetes   . Sarcoidosis, lung ([place]) 15 YRS AGO   NO TREATMENTS  . 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 Vitamin D deficiency    Family history- Reviewed and unchanged Social history- Reviewed and unchanged   Review of Systems:  ROS    Physical Exam: There were no vitals taken for this visit. Wt Readings from Last 3 Encounters:  06/19/17 162 lb (73.5 kg)  05/26/17 162 lb (73.5 kg)  02/25/17 164 lb (74.4 kg)   General Appearance: Well nourished, in no apparent distress. Eyes: PERRLA, EOMs, conjunctiva no swelling or erythema Sinuses: No Frontal/maxillary tenderness ENT/Mouth: Ext aud canals clear, TMs without erythema, bulging. No erythema, swelling, or exudate on post pharynx.  Tonsils not swollen or erythematous. Hearing normal.  Neck: Supple, thyroid normal.  Respiratory: Respiratory effort normal, BS equal bilaterally without rales, rhonchi, wheezing or stridor.  Cardio: RRR with no MRGs. Brisk peripheral pulses without edema.  Abdomen: Soft, + BS.  Non tender, no guarding, rebound, hernias, masses. Lymphatics: Non tender without lymphadenopathy.  Musculoskeletal: Full ROM, 5/5 strength, {PSY - GAIT AND STATION:22860} gait Skin: Warm, dry without rashes, lesions, ecchymosis.  Neuro: Cranial nerves intact. No cerebellar symptoms.  Psych: Awake and oriented X 3, normal affect, Insight and Judgment appropriate.    Izora Ribas, NP 1:33 PM [facility]

## 2017-08-27 ENCOUNTER — Ambulatory Visit: Payer: Self-pay | Admitting: Adult Health

## 2017-08-28 ENCOUNTER — Encounter: Payer: Self-pay | Admitting: Internal Medicine

## 2017-08-28 ENCOUNTER — Ambulatory Visit (INDEPENDENT_AMBULATORY_CARE_PROVIDER_SITE_OTHER): Payer: Medicare Other | Admitting: Internal Medicine

## 2017-08-28 VITALS — BP 128/80 | HR 83 | Wt 163.4 lb

## 2017-08-28 DIAGNOSIS — E559 Vitamin D deficiency, unspecified: Secondary | ICD-10-CM | POA: Diagnosis not present

## 2017-08-28 DIAGNOSIS — E042 Nontoxic multinodular goiter: Secondary | ICD-10-CM | POA: Diagnosis not present

## 2017-08-28 DIAGNOSIS — E890 Postprocedural hypothyroidism: Secondary | ICD-10-CM | POA: Diagnosis not present

## 2017-08-28 LAB — TSH: TSH: 0.29 u[IU]/mL — AB (ref 0.35–4.50)

## 2017-08-28 LAB — T4, FREE: FREE T4: 1.14 ng/dL (ref 0.60–1.60)

## 2017-08-28 MED ORDER — LEVOTHYROXINE SODIUM 50 MCG PO TABS: ORAL_TABLET | ORAL | 3 refills | Status: DC

## 2017-08-28 NOTE — Patient Instructions (Addendum)
Please stop at the lab.  Continue Levothyroxine 75 mcg daily.  Take the thyroid hormone every day, with water, at least 30 minutes before breakfast, separated by at least 4 hours from: - acid reflux medications - calcium - iron - multivitamins  Restart vitamin D 5000 units daily.  Please come back for a follow-up appointment in 6 months.

## 2017-08-28 NOTE — Progress Notes (Signed)
Patient ID: Laura Maynard, female   DOB: 1946-04-07, 71 y.o.   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 Last visit 6 mo ago.  Her brother just died on Thanksgiving day.  She is tearful in the office today.  She had itching in 05/2017 >> better after Prednisone and Zyrtec. Still on Zyrtec.  She stopped her vitamin D supplement around the time of her itching.    She is frustrated that we had to change her LT4 dose few times.  Reviewed history: Pt had screening labs in 01/2014 and was found to have a low TSH. This was repeated 2x and the TSH continued to decrease.   Thyroid Uptake and scan (05/20/2014) >> uptake 48.5% and scan c/w TMNG.  We started MMI 5 mg bid.  Had RAI tx on 06/09/2014.   After RAI treatment, she developed post-ablative hypothyroidism >> we started levothyroxine 50 g daily. She developed leg cramps, which resolved after stopping levothyroxine but her TSH started to increase, so we change to Tirosint. Unfortunately, she could not afford this and we had to restart levothyroxine at the lower dose, subsequent to be increased back to 50 g daily (on 08/12/2016).  We then increase to 75 mcg daily (now on 1.5 tablets of the 50 mcg tablet due to itching with  the previous dose).  Pt is on levothyroxine 75 mcg daily, taken: - in am - fasting - at least 30 min from b'fast - no Ca, Fe, MVI, PPIs - not on Biotin  I reviewed pt's thyroid tests-latest level was low, however, this was drawn while on prednisone: Lab Results  Component Value Date   TSH 0.16 (L) 06/23/2017   TSH 5.51 (H) 04/28/2017   TSH 3.96 02/04/2017   TSH 3.77 11/05/2016   TSH 1.31 10/02/2016   TSH 11.77 (H) 08/12/2016   TSH 24.92 (H) 06/26/2016   TSH 3.46 04/22/2016   TSH 1.59 02/20/2016   TSH 0.16 (L) 01/16/2016   FREET4 1.11 06/23/2017   FREET4 0.91 04/28/2017   FREET4 0.87  10/02/2016   FREET4 0.65 08/12/2016   FREET4 0.44 (L) 06/26/2016   FREET4 0.79 02/20/2016   FREET4 0.83 11/09/2015   FREET4 0.77 08/31/2015   FREET4 1.05 07/06/2015   FREET4 1.01 05/31/2015    Pt denies: - feeling nodules in neck - hoarseness - dysphagia - choking - SOB with lying down  She also has a history of HTN, HL, preDM, anemia.  She also has vitamin D deficiency: Lab Results  Component Value Date   VD25OH 16.80 (L) 06/23/2017   VD25OH 23.88 (L) 04/28/2017   VD25OH 20 (L) 02/04/2017   VD25OH 8 (L) 11/05/2016   VD25OH 12 (L) 01/16/2016   VD25OH 13 (L) 07/27/2015   VD25OH 15 (L) 04/19/2015   VD25OH 20 (L) 12/27/2014   VD25OH 19 (L) 09/14/2014   VD25OH 33 05/30/2014   We initially started 5000 units of vitamin D daily >> stopped when itching started.  ROS: Constitutional: no weight gain/no weight loss, no fatigue, no subjective hyperthermia, no subjective hypothermia Eyes: no blurry vision, no xerophthalmia ENT: no sore throat, + see HPI Cardiovascular: no CP/no SOB/no palpitations/no leg swelling Respiratory: no cough/no SOB/no wheezing Gastrointestinal: no N/no V/no D/no C/no acid reflux Musculoskeletal: no muscle aches/no joint aches Skin: no rashes, no hair loss Neurological: no tremors/no numbness/no tingling/no dizziness  I reviewed pt's medications,  allergies, PMH, social hx, family hx, and changes were documented in the history of present illness. Otherwise, unchanged from my initial visit note.  PE: BP 128/80   Pulse 83   Wt 163 lb 6.4 oz (74.1 kg)   SpO2 98%   BMI 26.37 kg/m  Body mass index is 26.37 kg/m. Wt Readings from Last 3 Encounters:  08/28/17 163 lb 6.4 oz (74.1 kg)  06/19/17 162 lb (73.5 kg)  05/26/17 162 lb (73.5 kg)   Constitutional: overweight, in NAD Eyes: PERRLA, EOMI, no exophthalmos ENT: moist mucous membranes, no thyromegaly, no cervical lymphadenopathy Cardiovascular: RRR, No MRG Respiratory: CTA B Gastrointestinal:  abdomen soft, NT, ND, BS+ Musculoskeletal: no deformities, strength intact in all 4 Skin: moist, warm, no rashes Neurological: no tremor with outstretched hands, DTR normal in all 4  ASSESSMENT: 1. Hypothyroidism after RAI tx   2. MNG  3. vitamin D deficiency  PLAN:  1. And 2.  Patient with history of thyrotoxicosis due to toxic multinodular goiter, resolved after RAI treatment.  She developed post ablative hypothyroidism, for which we started levothyroxine.  She initially had severe leg cramps from this but she was also found to have vitamin D deficiency and was started on supplementation.  We tried Tirosint but this was too expensive for her.  At last visit, she was on levothyroxine 75 mcg daily but developed itching so we changed to 1.5 tablet of the 50 mcg daily.  Itching is resolved.  We discussed about continuing on this dose. - latest thyroid labs reviewed with pt >> TSH was low, however, this was obtained while on prednisone - Patient is asymptomatic on this dose - we discussed about taking the thyroid hormone every day, with water, >30 minutes before breakfast, separated by >4 hours from acid reflux medications, calcium, iron, multivitamins. Pt. is taking it correctly.  She does not miss doses. - will check thyroid tests today: TSH and fT4 - If labs are abnormal, she will need to return for repeat TFTs in 1.5 months - OTW, RTC in 6 mo   3.  vitamin D deficiency  - Very low level at last check - She stopped her vitamin D supplement 2 months ago.   - I advised her to restart 5000 units daily.  She tells me that she had headaches after this before.  I advised her to get the gluten-free formulation.  Upon questioning, she can also use vitamin D drops.  Office Visit on 08/28/2017  Component Date Value Ref Range Status  . TSH 08/28/2017 0.29* 0.35 - 4.50 uIU/mL Final  . Free T4 08/28/2017 1.14  0.60 - 1.60 ng/dL Final   Comment: Specimens from patients who are undergoing biotin  therapy and /or ingesting biotin supplements may contain high levels of biotin.  The higher biotin concentration in these specimens interferes with this Free T4 assay.  Specimens that contain high levels  of biotin may cause false high results for this Free T4 assay.  Please interpret results in light of the total clinical presentation of the patient.     TSH still slightly low >> will ask her to alternate 50 with 75 mcg LT4 qod. Repeat TFTs in 6 weeks.  Philemon Kingdom, MD PhD [facility]

## 2017-09-01 ENCOUNTER — Other Ambulatory Visit: Payer: Self-pay | Admitting: *Deleted

## 2017-09-01 ENCOUNTER — Telehealth: Payer: Self-pay | Admitting: Internal Medicine

## 2017-09-01 MED ORDER — ROSUVASTATIN CALCIUM 10 MG PO TABS: 10.0000 mg | ORAL_TABLET | Freq: Every day | ORAL | 0 refills | Status: DC

## 2017-09-01 NOTE — Telephone Encounter (Signed)
Pt is aware and will take the 1.5 tablets

## 2017-09-01 NOTE — Telephone Encounter (Signed)
Patient returning call. Please call patient at ph# [phone] leave message on voice mail if unavailable

## 2017-09-01 NOTE — Telephone Encounter (Signed)
Pt would like to know why she needs to take the 49mcg when she has been taking 61mcg daily for "a long time" she would like it explained to her further, please advise

## 2017-09-01 NOTE — Telephone Encounter (Signed)
I explained to her extensively at the time of the visit that the 75 mcg tablet of levothyroxine is the one that could have caused her itching.  I would suggest to stay on 1.5 tablets of the 50 mcg (75 mcg total) daily, since the 50 mcg tablet does not have any coloring agents and is less likely to cause itching.   However, if she wants to retry the 75 mcg tablet, you can send this to her pharmacy.

## 2017-10-14 ENCOUNTER — Telehealth: Payer: Self-pay

## 2017-10-14 ENCOUNTER — Other Ambulatory Visit (INDEPENDENT_AMBULATORY_CARE_PROVIDER_SITE_OTHER): Payer: Medicare Other

## 2017-10-14 ENCOUNTER — Other Ambulatory Visit: Payer: Self-pay

## 2017-10-14 DIAGNOSIS — E890 Postprocedural hypothyroidism: Secondary | ICD-10-CM | POA: Diagnosis not present

## 2017-10-14 LAB — T4, FREE: Free T4: 0.95 ng/dL (ref 0.60–1.60)

## 2017-10-14 LAB — TSH: TSH: 0.75 u[IU]/mL (ref 0.35–4.50)

## 2017-10-14 NOTE — Telephone Encounter (Signed)
Pt decided she did not need a refill at this time

## 2017-10-14 NOTE — Telephone Encounter (Signed)
LMTCB

## 2017-10-14 NOTE — Telephone Encounter (Signed)
-----   Message from Philemon Kingdom, MD sent at 10/14/2017 11:41 AM EST ----- Loma Sousa, can you please call pt: TFTs now great! We can continue the current LT4 dose.

## 2017-12-02 ENCOUNTER — Other Ambulatory Visit: Payer: Self-pay | Admitting: Internal Medicine

## 2017-12-29 HISTORY — PX: CATARACT EXTRACTION: SUR2

## 2018-01-14 DIAGNOSIS — H2512 Age-related nuclear cataract, left eye: Secondary | ICD-10-CM | POA: Insufficient documentation

## 2018-01-14 DIAGNOSIS — H25012 Cortical age-related cataract, left eye: Secondary | ICD-10-CM | POA: Insufficient documentation

## 2018-01-15 DIAGNOSIS — H2701 Aphakia, right eye: Secondary | ICD-10-CM | POA: Insufficient documentation

## 2018-02-02 ENCOUNTER — Telehealth: Payer: Self-pay | Admitting: Internal Medicine

## 2018-02-02 NOTE — Telephone Encounter (Signed)
Patient stated that she has been feeling lightheaded, she think it is the medication, she would like to get her labs drawn. Please advise   Leave a message if she do not answer

## 2018-02-03 NOTE — Telephone Encounter (Addendum)
Spoke to patient.  Lightheaded/dizziness x2weeks Intermittently.  Requesting to have all blood work drawn to determine cause of dizziness Advised pt to contact PCP.

## 2018-02-05 ENCOUNTER — Ambulatory Visit: Payer: Medicare Other | Admitting: Physician Assistant

## 2018-02-05 ENCOUNTER — Encounter: Payer: Self-pay | Admitting: Physician Assistant

## 2018-02-05 VITALS — BP 122/66 | HR 77 | Temp 97.3°F | Resp 14 | Ht 66.0 in | Wt 165.8 lb

## 2018-02-05 DIAGNOSIS — E042 Nontoxic multinodular goiter: Secondary | ICD-10-CM | POA: Diagnosis not present

## 2018-02-05 DIAGNOSIS — Z0001 Encounter for general adult medical examination with abnormal findings: Secondary | ICD-10-CM | POA: Diagnosis not present

## 2018-02-05 DIAGNOSIS — R6889 Other general symptoms and signs: Secondary | ICD-10-CM | POA: Diagnosis not present

## 2018-02-05 DIAGNOSIS — R200 Anesthesia of skin: Secondary | ICD-10-CM

## 2018-02-05 DIAGNOSIS — Z6826 Body mass index (BMI) 26.0-26.9, adult: Secondary | ICD-10-CM | POA: Diagnosis not present

## 2018-02-05 DIAGNOSIS — Z91148 Patient's other noncompliance with medication regimen for other reason: Secondary | ICD-10-CM

## 2018-02-05 DIAGNOSIS — R5383 Other fatigue: Secondary | ICD-10-CM | POA: Diagnosis not present

## 2018-02-05 DIAGNOSIS — M26609 Unspecified temporomandibular joint disorder, unspecified side: Secondary | ICD-10-CM | POA: Diagnosis not present

## 2018-02-05 DIAGNOSIS — Z79899 Other long term (current) drug therapy: Secondary | ICD-10-CM

## 2018-02-05 DIAGNOSIS — Z9114 Patient's other noncompliance with medication regimen: Secondary | ICD-10-CM | POA: Diagnosis not present

## 2018-02-05 DIAGNOSIS — E559 Vitamin D deficiency, unspecified: Secondary | ICD-10-CM

## 2018-02-05 DIAGNOSIS — I10 Essential (primary) hypertension: Secondary | ICD-10-CM

## 2018-02-05 DIAGNOSIS — H8113 Benign paroxysmal vertigo, bilateral: Secondary | ICD-10-CM | POA: Diagnosis not present

## 2018-02-05 DIAGNOSIS — Z0000 Encounter for general adult medical examination without abnormal findings: Secondary | ICD-10-CM

## 2018-02-05 DIAGNOSIS — E890 Postprocedural hypothyroidism: Secondary | ICD-10-CM | POA: Diagnosis not present

## 2018-02-05 DIAGNOSIS — F428 Other obsessive-compulsive disorder: Secondary | ICD-10-CM

## 2018-02-05 DIAGNOSIS — R202 Paresthesia of skin: Secondary | ICD-10-CM

## 2018-02-05 DIAGNOSIS — E782 Mixed hyperlipidemia: Secondary | ICD-10-CM

## 2018-02-05 DIAGNOSIS — M6281 Muscle weakness (generalized): Secondary | ICD-10-CM

## 2018-02-05 DIAGNOSIS — Z87898 Personal history of other specified conditions: Secondary | ICD-10-CM

## 2018-02-05 NOTE — Patient Instructions (Addendum)
[facility] Address: [address] [place], [place] [address]  Phone: [phone]  [facility] [place], go to radiology and give them your name. They will have the order and take you back. You do not any paper work, I should get the result back today or tomorrow. This order is good for a year.   Will go to the ER if worsening headache, changes vision/speech, imbalance, weakness.    Peripheral Neuropathy Peripheral neuropathy is a type of nerve damage. It affects nerves that carry signals between the spinal cord and other parts of the body. These are called peripheral nerves. With peripheral neuropathy, one nerve or a group of nerves may be damaged. What are the causes? Many things can damage peripheral nerves. For some people with peripheral neuropathy, the cause is unknown. Some causes include:  Diabetes. This is the most common cause of peripheral neuropathy.  Injury to a nerve.  Pressure or stress on a nerve that lasts a long time.  Too little vitamin B. Alcoholism can lead to this.  Infections.  Autoimmune diseases, such as multiple sclerosis and systemic lupus erythematosus.  Inherited nerve diseases.  Some medicines, such as cancer drugs.  Toxic substances, such as lead and mercury.  Too little blood flowing to the legs.  Kidney disease.  Thyroid disease.  What are the signs or symptoms? Different people have different symptoms. The symptoms you have will depend on which of your nerves is damaged. Common symptoms include:  Loss of feeling (numbness) in the feet and hands.  Tingling in the feet and hands.  Pain that burns.  Very sensitive skin.  Weakness.  Not being able to move a part of the body (paralysis).  Muscle twitching.  Clumsiness or poor coordination.  Loss of balance.  Not being able to control your bladder.  Feeling dizzy.  Sexual problems.  How is this diagnosed? Peripheral neuropathy is a symptom, not a  disease. Finding the cause of peripheral neuropathy can be hard. To figure that out, your health care provider will take a medical history and do a physical exam. A neurological exam will also be done. This involves checking things affected by your brain, spinal cord, and nerves (nervous system). For example, your health care provider will check your reflexes, how you move, and what you can feel. Other types of tests may also be ordered, such as:  Blood tests.  A test of the fluid in your spinal cord.  Imaging tests, such as CT scans or an MRI.  Electromyography (EMG). This test checks the nerves that control muscles.  Nerve conduction velocity tests. These tests check how fast messages pass through your nerves.  Nerve biopsy. A small piece of nerve is removed. It is then checked under a microscope.  How is this treated?  Medicine is often used to treat peripheral neuropathy. Medicines may include: ? Pain-relieving medicines. Prescription or over-the-counter medicine may be suggested. ? Antiseizure medicine. This may be used for pain. ? Antidepressants. These also may help ease pain from neuropathy. ? Lidocaine. This is a numbing medicine. You might wear a patch or be given a shot. ? Mexiletine. This medicine is typically used to help control irregular heart rhythms.  Surgery. Surgery may be needed to relieve pressure on a nerve or to destroy a nerve that is causing pain.  Physical therapy to help movement.  Assistive devices to help movement. Follow these instructions at home:  Only take over-the-counter or prescription medicines as directed  by your health care provider. Follow the instructions carefully for any given medicines. Do not take any other medicines without first getting approval from your health care provider.  If you have diabetes, work closely with your health care provider to keep your blood sugar under control.  If you have numbness in your feet: ? Check every day  for signs of injury or infection. Watch for redness, warmth, and swelling. ? Wear padded socks and comfortable shoes. These help protect your feet.  Do not do things that put pressure on your damaged nerve.  Do not smoke. Smoking keeps blood from getting to damaged nerves.  Avoid or limit alcohol. Too much alcohol can cause a lack of B vitamins. These vitamins are needed for healthy nerves.  Develop a good support system. Coping with peripheral neuropathy can be stressful. Talk to a mental health specialist or join a support group if you are struggling.  Follow up with your health care provider as directed. Contact a health care provider if:  You have new signs or symptoms of peripheral neuropathy.  You are struggling emotionally from dealing with peripheral neuropathy.  You have a fever. Get help right away if:  You have an injury or infection that is not healing.  You feel very dizzy or begin vomiting.  You have chest pain.  You have trouble breathing. This information is not intended to replace advice given to you by your health care provider. Make sure you discuss any questions you have with your health care provider. Document Released: 09/06/2002 Document Revised: 02/22/2016 Document Reviewed: 05/24/2013 Elsevier Interactive Patient Education  2017 [employer].

## 2018-02-05 NOTE — Progress Notes (Signed)
MEDICARE ANNUAL WELLNESS VISIT AND FOLLOW UP  Assessment:    Essential hypertension - continue medications, DASH diet, exercise and monitor at home. Call if greater than 130/80.  -     COMPLETE METABOLIC PANEL WITH GFR -     CBC with Differential/Platelet -     TSH -     Urinalysis, Routine w reflex microscopic  History of prediabetes Monitor  Postablative hypothyroidism Will check TSH, follows with Dr. Darnell Level, continue med the same at this time -     TSH  Hyperlipidemia -continue medications, check lipids, decrease fatty foods, increase activity.   TMJ (temporomandibular joint syndrome) Monitor  Benign paroxysmal positional vertigo due to bilateral vestibular disorder Monitor  Multinodular goiter Will check TSH, follows with Dr. Darnell Level, continue med the same at this time  Vitamin D deficiency -     VITAMIN D 25 Hydroxy (Vit-D Deficiency, Fractures)  Poor compliance with medication No emphasized compliance with medications and appointments, patient expressed understanding  Other obsessive-compulsive disorders Patient is tearful about her brother passing in Nov Declines medications, given counseling numbers and number for hospice counseling  Encounter for Medicare annual wellness exam 1 year  Medication management -     Magnesium  BMI 26.0-26.9,adult  Overweight  - long discussion about weight loss, diet, and exercise -recommended diet heavy in fruits and veggies and low in animal meats, cheeses, and dairy products  Fatigue, unspecified type Check labs Patient is tearful with passing of brother in Nov- declines meds- suggested counseling -     Urinalysis, Routine w reflex microscopic -     Iron,Total/Total Iron Binding Cap -     Folate RBC -     Vitamin B12 -     RPR -     Sedimentation rate -     ANA -     Anti-DNA antibody, double-stranded  Muscle weakness -     Acetylcholine receptor, blocking -     Myasthenia gravis panel 2 -     ANA -     Anti-DNA  antibody, double-stranded  Numbness and tingling in both hands Normal neuro ? From cervical spine versus carpal tunnel, + tinels Will refer to neuro for evaluation/EMG Get cervical neck xray Wear brace at night for carpal tunnel in the mean time Will go to the ER if headache, changes vision/speech, imbalance, weakness. -     Iron,Total/Total Iron Binding Cap -     Folate RBC -     Vitamin B12 -     RPR -     Sedimentation rate -     ANA -     Anti-DNA antibody, double-stranded -     DG Cervical Spine Complete; Future     Over 30 minutes of exam, counseling, chart review, and critical decision making was performed  Future Appointments  Date Time Provider [place]  02/24/2018  9:15 AM Philemon Kingdom, MD [id number] None  03/04/2018  2:00 PM [name], MD [facility] None    Plan:   During the course of the visit the patient was educated and counseled about appropriate screening and preventive services including:    Pneumococcal vaccine   Influenza vaccine  Td vaccine  Prevnar 13  Screening electrocardiogram  Screening mammography  Bone densitometry screening  Colorectal cancer screening  Diabetes screening  Glaucoma screening  Nutrition counseling   Advanced directives: given info/requested copies   Subjective:   Laura Maynard is a 72 y.o. female who presents for Medicare Annual  Wellness Visit and 3 month follow up on hypertension, prediabetes, hyperlipidemia, vitamin D def.  She complains of hands and feet numbness, dizziness and fatigue, comes and goes. She states it feels like it felt like when she had thyroid.   Fatigue can be when she wakes up and all day, she admits to being inactive. She did cut hedges 2 weeks ago without SOB,CP, dizziness, or hand numbness.  She has bilateral hand numbness, states whole hand, no weakness/dropping anything, worse with prolonged use. She does have neck pain, worse on right side.   She will have  abnormal sensation bilateral legs, worse with driving or using them such as talking on the phone. No numbness in the AM. She does have pain down bilateral legs. Patient denies fever, hematuria, weakness and saddle anesthesia. She has had incontinence x several months.    CT head 11/2015 normal MRI lumbar 2014-  Slightly abnormal MRI scan of the lumbar spine showing mild spondylolisthesis at L3-L4 and facet degenerative changes at L3-4 and L4-5 with mild bilateral foraminal narrowing but without definite root impingement  Her blood pressure has been controlled at home, today their BP is BP: 122/66 She does not workout. She denies chest pain, shortness of breath, dizziness.  She is on cholesterol medication and denies myalgias. Her cholesterol is at goal. The cholesterol last visit was:   Lab Results  Component Value Date   CHOL 238 (H) 05/26/2017   HDL 115 05/26/2017   LDLCALC 109 (H) 05/26/2017   TRIG 48 05/26/2017   CHOLHDL 2.1 05/26/2017     Lab Results  Component Value Date   HGBA1C 5.1 02/04/2017   Last GFR Lab Results  Component Value Date   GFRAA 83 05/26/2017   Patient is on Vitamin D supplement. Lab Results  Component Value Date   VD25OH 16.80 (L) 06/23/2017     She is on thyroid medication, follows with Dr. Cruzita Lederer. Her medication was not changed last visit.   Lab Results  Component Value Date   TSH 0.75 10/14/2017  .  BMI is Body mass index is 26.76 kg/m., she is working on diet and exercise. Wt Readings from Last 3 Encounters:  02/05/18 165 lb 12.8 oz (75.2 kg)  08/28/17 163 lb 6.4 oz (74.1 kg)  06/19/17 162 lb (73.5 kg)    Medication Review  Current Outpatient Medications (Endocrine & Metabolic):  .  levothyroxine (SYNTHROID, LEVOTHROID) 50 MCG tablet, Please take alternate 1 with 1.5 tablets every other day  Current Outpatient Medications (Cardiovascular):  .  rosuvastatin (CRESTOR) 10 MG tablet, Take 1 tablet (10 mg total) by mouth at bedtime. (Patient  not taking: Reported on 02/05/2018)   Current Outpatient Medications (Analgesics):  .  aspirin 81 MG tablet, Take 81 mg by mouth daily.   .  butalbital-acetaminophen-caffeine (FIORICET, ESGIC) 50-325-40 MG tablet, Take 1 tablet 4 x / day if needed for headache .  naproxen sodium (ANAPROX) 220 MG tablet, Take 440 mg by mouth 2 (two) times daily as needed (pain).   Current Outpatient Medications (Other):  .  meclizine (ANTIVERT) 25 MG tablet, Take 1 tablet 3 x d if needed for Dizziness / Vertigo  Allergies: Allergies  Allergen Reactions  . Penicillins Hives  . Pravastatin     Myalgia  . Sulfa Antibiotics Hives  . Zetia [Ezetimibe]     Myalgia    Current Problems (verified) has Hypertension; History of prediabetes; Vitamin D deficiency; Hyperlipidemia; Poor compliance with medication; Benign paroxysmal positional vertigo; TMJ (temporomandibular  joint syndrome); Postablative hypothyroidism; Medication Phobia; and Multinodular goiter on their problem list.  Screening Tests Immunization History  Administered Date(s) Administered  . Influenza Split 06/30/2013  . Influenza, High Dose Seasonal PF 06/26/2015, 07/25/2016, 05/26/2017  . Pneumococcal Conjugate-13 09/14/2014  . Pneumococcal Polysaccharide-23 10/16/2011, 02/04/2017  . Td 10/16/2011  . Zoster 01/14/2007    Preventative care: Last colonoscopy: 2012 cologuard 01/2017 Last mammogram: 05/2017 will get in Sept  Prior vaccinations: TD or Tdap: 2013  Influenza: 2017  Pneumococcal:  2013 Prevnar13:  2015 Shingles/Zostavax: 2008  Names of Other Physician/Practitioners you currently use: 1. [facility]- here for primary care 2. Dr Anastasio Champion. , eye doctor, last visit   April 2019 3. Dr. Zenaida Niece , dentist, last visit 2019 Patient Care Team: [name], MD as PCP - General (Internal Medicine) Vanessa Kick, MD (Dermatology) Philemon Kingdom, MD as Consulting Physician (Internal  Medicine) Marcial Pacas, MD as Consulting Physician (Neurology) Gatha Mayer, MD as Consulting Physician (Gastroenterology) Rozetta Nunnery, MD as Consulting Physician (Otolaryngology) Gaynelle Arabian, MD as Consulting Physician (Orthopedic Surgery) Dr. Ulanda Edison  Surgical: She  has a past surgical history that includes Eye surgery (1964); Back surgery (1970); Nasal septum surgery (1998); Total abdominal hysterectomy (2000); Knee arthroscopy (2002); and Colonoscopy (2006; 03/28/11). Family Her family history includes COPD in her sister; Diabetes in her brother; Emphysema in her father; Lung cancer (age of onset: 13) in her sister; Stroke in her mother; Throat cancer (age of onset: 62) in her brother. Social history  She reports that she has never smoked. She has never used smokeless tobacco. She reports that she does not drink alcohol or use drugs.  MEDICARE WELLNESS OBJECTIVES: Physical activity: Current Exercise Habits: The patient does not participate in regular exercise at present Cardiac risk factors: Cardiac Risk Factors include: advanced age (>48men, >22 women);dyslipidemia;hypertension;sedentary lifestyle Depression/mood screen:   Depression screen [facility] 2/9 02/05/2018  Decreased Interest 0  Down, Depressed, Hopeless 0  PHQ - 2 Score 0    ADLs:  In your present state of health, do you have any difficulty performing the following activities: 02/05/2018  Hearing? N  Vision? N  Difficulty concentrating or making decisions? N  Walking or climbing stairs? N  Dressing or bathing? N  Doing errands, shopping? N  Some recent data might be hidden     Cognitive Testing  Alert? Yes  Normal Appearance?Yes  Oriented to person? Yes  Place? Yes   Time? Yes  Recall of three objects?  Yes  Can perform simple calculations? Yes  Displays appropriate judgment?Yes  Can read the correct time from a watch face?Yes  EOL planning: Does Patient Have a Medical Advance Directive?: Yes Type of  Advance Directive: Healthcare Power of Attorney, Living will Copy of [place] in Chart?: No - copy requested   Objective:   Today's Vitals   02/05/18 1021  BP: 122/66  Pulse: 77  Resp: 14  Temp: (!) 97.3 F (36.3 C)  SpO2: 99%  Weight: 165 lb 12.8 oz (75.2 kg)  Height: 5\' 6"  (1.676 m)  PainSc: 0-No pain   Wt Readings from Last 3 Encounters:  02/05/18 165 lb 12.8 oz (75.2 kg)  08/28/17 163 lb 6.4 oz (74.1 kg)  06/19/17 162 lb (73.5 kg)    Body mass index is 26.76 kg/m.  General appearance: alert, no distress, WD/WN,  female HEENT: normocephalic, sclerae anicteric, TMs pearly, nares patent, no discharge or erythema, pharynx normal Oral cavity: MMM, no lesions  Neck: supple, no lymphadenopathy, no thyromegaly, no masses Heart: RRR, normal S1, S2, no murmurs Lungs: CTA bilaterally, no wheezes, rhonchi, or rales Abdomen: +bs, soft, non tender, non distended, no masses, no hepatomegaly, no splenomegaly Musculoskeletal: nontender C7, + pain with neck flexion, no swelling, no obvious deformity Extremities: no edema, no cyanosis, no clubbing Pulses: 2+ symmetric, upper and lower extremities, normal cap refill Neurological: alert, oriented x 3, CN2-12 intact, strength normal upper extremities and lower extremities, + tinel's sign bilateral hands, sensation normal throughout, DTRs 2+ throughout, no cerebellar signs, gait normal Psychiatric: normal affect, behavior normal, pleasant  Breast: defer Gyn: defer Rectal: defer   Medicare Attestation I have personally reviewed: The patient's medical and social history Their use of alcohol, tobacco or illicit drugs Their current medications and supplements The patient's functional ability including ADLs,fall risks, home safety risks, cognitive, and hearing and visual impairment Diet and physical activities Evidence for depression or mood disorders  The patient's weight, height, BMI, and visual acuity have been  recorded in the chart.  I have made referrals, counseling, and provided education to the patient based on review of the above and I have provided the patient with a written personalized care plan for preventive services.     Vicie Mutters, PA-C   02/05/2018

## 2018-02-11 LAB — CBC WITH DIFFERENTIAL/PLATELET
Basophils Absolute: 41 cells/uL (ref 0–200)
Basophils Relative: 0.7 %
EOS PCT: 1 %
Eosinophils Absolute: 58 cells/uL (ref 15–500)
HEMATOCRIT: 35.8 % (ref 35.0–45.0)
Hemoglobin: 11.8 g/dL (ref 11.7–15.5)
LYMPHS ABS: 1641 {cells}/uL (ref 850–3900)
MCH: 27.3 pg (ref 27.0–33.0)
MCHC: 33 g/dL (ref 32.0–36.0)
MCV: 82.7 fL (ref 80.0–100.0)
MONOS PCT: 9.2 %
MPV: 9.3 fL (ref 7.5–12.5)
NEUTROS PCT: 60.8 %
Neutro Abs: 3526 cells/uL (ref 1500–7800)
Platelets: 391 10*3/uL (ref 140–400)
RBC: 4.33 10*6/uL (ref 3.80–5.10)
RDW: 12.9 % (ref 11.0–15.0)
Total Lymphocyte: 28.3 %
WBC mixed population: 534 cells/uL (ref 200–950)
WBC: 5.8 10*3/uL (ref 3.8–10.8)

## 2018-02-11 LAB — SEDIMENTATION RATE: SED RATE: 14 mm/h (ref 0–30)

## 2018-02-11 LAB — MYASTHENIA GRAVIS PANEL 2
ACHR Blocking Abs: <15 % Inhibition (ref <15)
Acetylchol Modul Ab: 7 % Inhibition

## 2018-02-11 LAB — COMPLETE METABOLIC PANEL WITH GFR
AG RATIO: 2 (calc) (ref 1.0–2.5)
ALBUMIN MSPROF: 4.4 g/dL (ref 3.6–5.1)
ALT: 16 U/L (ref 6–29)
AST: 30 U/L (ref 10–35)
Alkaline phosphatase (APISO): 98 U/L (ref 33–130)
BILIRUBIN TOTAL: 0.4 mg/dL (ref 0.2–1.2)
BUN: 12 mg/dL (ref 7–25)
CHLORIDE: 106 mmol/L (ref 98–110)
CO2: 27 mmol/L (ref 20–32)
Calcium: 9.5 mg/dL (ref 8.6–10.4)
Creat: 0.76 mg/dL (ref 0.60–0.93)
GFR, EST AFRICAN AMERICAN: 91 mL/min/{1.73_m2} (ref >=60)
GFR, Est Non African American: 79 mL/min/{1.73_m2} (ref >=60)
Globulin: 2.2 g/dL (calc) (ref 1.9–3.7)
Glucose, Bld: 87 mg/dL (ref 65–99)
POTASSIUM: 4.4 mmol/L (ref 3.5–5.3)
Sodium: 141 mmol/L (ref 135–146)
TOTAL PROTEIN: 6.6 g/dL (ref 6.1–8.1)

## 2018-02-11 LAB — VITAMIN D 25 HYDROXY (VIT D DEFICIENCY, FRACTURES): VIT D 25 HYDROXY: 12 ng/mL — AB (ref 30–100)

## 2018-02-11 LAB — URINALYSIS, ROUTINE W REFLEX MICROSCOPIC
BACTERIA UA: NONE SEEN /HPF
Bilirubin Urine: NEGATIVE
GLUCOSE, UA: NEGATIVE
HYALINE CAST: NONE SEEN /LPF
KETONES UR: NEGATIVE
LEUKOCYTES UA: NEGATIVE
Nitrite: NEGATIVE
PROTEIN: NEGATIVE
Specific Gravity, Urine: 1.022 (ref 1.001–1.03)
Squamous Epithelial / LPF: NONE SEEN /HPF (ref <=5)
WBC UA: NONE SEEN /HPF (ref 0–5)

## 2018-02-11 LAB — IRON, TOTAL/TOTAL IRON BINDING CAP
%SAT: 16 % (calc) (ref 11–50)
Iron: 59 ug/dL (ref 45–160)
TIBC: 365 mcg/dL (calc) (ref 250–450)

## 2018-02-11 LAB — ANTI-DNA ANTIBODY, DOUBLE-STRANDED: ds DNA Ab: <1 IU/mL

## 2018-02-11 LAB — MAGNESIUM: MAGNESIUM: 2.4 mg/dL (ref 1.5–2.5)

## 2018-02-11 LAB — FOLATE RBC: RBC Folate: 898 ng/mL RBC (ref >280)

## 2018-02-11 LAB — RPR: RPR: NONREACTIVE

## 2018-02-11 LAB — TSH: TSH: 2.69 mIU/L (ref 0.40–4.50)

## 2018-02-11 LAB — ANTI-NUCLEAR AB-TITER (ANA TITER): ANA Titer 1: 1:80 {titer} — ABNORMAL HIGH

## 2018-02-11 LAB — ANA: Anti Nuclear Antibody(ANA): POSITIVE — AB

## 2018-02-11 LAB — VITAMIN B12: Vitamin B-12: 488 pg/mL (ref 200–1100)

## 2018-02-20 ENCOUNTER — Telehealth: Payer: Self-pay | Admitting: Internal Medicine

## 2018-02-20 NOTE — Telephone Encounter (Signed)
Patient is unsure why she needs to be seen by Dr Laura Maynard if her PCP is taking test for the same thing and monitoring thyroid. She is also unsure why she has an appt on the 28th if the pcp was sending our doctor the information and blood work she had done. Patient would like a call back to discuss this  Please advise

## 2018-02-24 ENCOUNTER — Ambulatory Visit: Payer: Medicare Other | Admitting: Internal Medicine

## 2018-02-24 NOTE — Telephone Encounter (Addendum)
I called pt- she states the first message below was incorrect as to what she was inquiring about and she thought the labs that Dr. Idell Pickles office drew on 02/05/18 would be reviewed by Dr. Cruzita Lederer.  I advised her that the ordering provider would be who will review/advise on labs or med changes. I did go over the provider's notes on the 02/05/18 result note. (See 02/05/18 labs)  She is scheduled to see PCP for a CPE on 03/04/18.  She is going to consult with him on when she needs to f/u with Dr. Cruzita Lederer further. Patient wanted to make you aware that she still wants you to provide her with care regarding her thyroid, she just didn't want repeat testing done by both offices at this time.

## 2018-02-24 NOTE — Telephone Encounter (Signed)
I agree with the plan. Her TFTs were normal.

## 2018-02-24 NOTE — Telephone Encounter (Signed)
Do you still need to see her?

## 2018-02-24 NOTE — Progress Notes (Deleted)
Patient ID: Laura Maynard, female   DOB: [birth year], 72 y.o.   MRN: [id number]   HPI  Laura Maynard is a 72 y.o.-year-old female, returning for f/u for postablative hypothyroidism after toxic multinodular goiter (TMNG) RAI tx. Last visit 6 months ago.  Reviewed history: Pt had screening labs in 01/2014 and was found to have a low TSH. This was repeated 2x and the TSH continued to decrease.   Thyroid Uptake and scan (05/20/2014) >> uptake 48.5% and scan c/w TMNG.  We started MMI 5 mg bid.  Had RAI tx on 06/09/2014.   After RAI treatment, she developed post-ablative hypothyroidism:  We initially started levothyroxine 50 mcg daily.  She developed leg cramps, which resolved after stopping levothyroxine, but her TSH started to increase, so we tried to [place].  In the meantime, her vitamin D was found low and this was presumed to be the cause of her leg cramps.  This was not affordable so we had to restart levothyroxine at a lower dose and then increased back to 50 mcg daily.  She developed itching in 05/2017 so we changed to only 50 mcg tablets levothyroxine, which are free of dyes and preservatives and subsequently increase the dose to 50 alternating with 75 mcg daily  Pt is currently on levothyroxine 50 (1 tab) alternating with 75 (1.5 tab) mcg daily, taken: - in am - fasting - at least 30 min from b'fast - no Ca, Fe, MVI, PPIs - not on Biotin  Reviewed patient's TFTs: Lab Results  Component Value Date   TSH 2.69 02/05/2018   TSH 0.75 10/14/2017   TSH 0.29 (L) 08/28/2017   TSH 0.16 (L) 06/23/2017   TSH 5.51 (H) 04/28/2017   TSH 3.96 02/04/2017   TSH 3.77 11/05/2016   TSH 1.31 10/02/2016   TSH 11.77 (H) 08/12/2016   TSH 24.92 (H) 06/26/2016   FREET4 0.95 10/14/2017   FREET4 1.14 08/28/2017   FREET4 1.11 06/23/2017   FREET4 0.91 04/28/2017   FREET4 0.87 10/02/2016   FREET4 0.65 08/12/2016   FREET4 0.44 (L) 06/26/2016   FREET4 0.79 02/20/2016   FREET4 0.83 11/09/2015    FREET4 0.77 08/31/2015    Pt denies: - feeling nodules in neck - hoarseness - dysphagia - choking - SOB with lying down  She also has a history of HTN, anemia, HL.  Vitamin D deficiency: Lab Results  Component Value Date   VD25OH 12 (L) 02/05/2018   VD25OH 16.80 (L) 06/23/2017   VD25OH 23.88 (L) 04/28/2017   VD25OH 20 (L) 02/04/2017   VD25OH 8 (L) 11/05/2016   VD25OH 12 (L) 01/16/2016   VD25OH 13 (L) 07/27/2015   VD25OH 15 (L) 04/19/2015   VD25OH 20 (L) 12/27/2014   VD25OH 19 (L) 09/14/2014   We initially started 5000 units vitamin D daily, but she stopped this when itching started..  She did not restart afterwards so at last visit her vitamin D was still low.  I advised her to start 5000 units daily.  She did not do so.  Latest vitamin D level obtained earlier this month was very low, at 12.  ROS: Constitutional: no weight gain/no weight loss, no fatigue, no subjective hyperthermia, no subjective hypothermia Eyes: no blurry vision, no xerophthalmia ENT: no sore throat, + see HPI Cardiovascular: no CP/no SOB/no palpitations/no leg swelling Respiratory: no cough/no SOB/no wheezing Gastrointestinal: no N/no V/no D/no C/no acid reflux Musculoskeletal: no muscle aches/no joint aches Skin: no rashes, no hair loss Neurological: no tremors/no  numbness/no tingling/no dizziness  I reviewed pt's medications, allergies, PMH, social hx, family hx, and changes were documented in the history of present illness. Otherwise, unchanged from my initial visit note.  PE: There were no vitals taken for this visit. There is no height or weight on file to calculate BMI. Wt Readings from Last 3 Encounters:  02/05/18 165 lb 12.8 oz (75.2 kg)  08/28/17 163 lb 6.4 oz (74.1 kg)  06/19/17 162 lb (73.5 kg)   Constitutional: overweight, in NAD Eyes: PERRLA, EOMI, no exophthalmos ENT: moist mucous membranes, no thyromegaly, no cervical lymphadenopathy Cardiovascular: RRR, No MRG Respiratory: CTA  B Gastrointestinal: abdomen soft, NT, ND, BS+ Musculoskeletal: no deformities, strength intact in all 4 Skin: moist, warm, no rashes Neurological: no tremor with outstretched hands, DTR normal in all 4   ASSESSMENT: 1. Hypothyroidism after RAI tx   2. MNG  3. vitamin D deficiency  4. H/o prediabetes  PLAN:  1. And 2.  Patient with history of thyrotoxicosis due to toxic multinodular goiter, now resolved post RAI treatment.  She developed post ablative hypothyroidism for which she is now on levothyroxine.  She could not afford Tirosint.  She developed itching on levothyroxine 75 mcg tablet so we changed to 1.5 tablets of the 50 mcg daily.  On this, itching has resolved.  She is not alternating 75 with 50 mcg every other day. - latest thyroid labs reviewed with pt >> normal earlier this month - pt feels good on this dose. - we discussed about taking the thyroid hormone every day, with water, >30 minutes before breakfast, separated by >4 hours from acid reflux medications, calcium, iron, multivitamins. Pt. is taking it correctly. - She has no neck compression symptoms  3.  vitamin D deficiency  - She had a very low level in the past - At last visit, she was off her vitamin supplement for 2 months and I advised her to restart 5000 units daily.  She was telling me at last visit that she had headaches after h vitamin D replacement in the past.  I advised her to get the gluten-free formulation and also could try vitamin D drops.  She did not restart.  As a consequence, her latest vitamin D level was very low, at 12, 3 weeks ago.  4. H/o prediabetes -This has resolved: Lab Results  Component Value Date   HGBA1C 5.1 02/04/2017   I will see the patient back in a year.  Philemon Kingdom, MD PhD [facility]

## 2018-02-24 NOTE — Telephone Encounter (Signed)
She can continue to see her PCP for this, in that case.

## 2018-02-25 NOTE — Telephone Encounter (Addendum)
Pt informed of below. She will call our office back when she is ready to schedule with Dr. Cruzita Lederer.

## 2018-03-04 ENCOUNTER — Ambulatory Visit: Payer: Medicare Other | Admitting: Internal Medicine

## 2018-03-04 VITALS — BP 116/76 | HR 76 | Temp 97.7°F | Resp 16 | Ht 66.0 in | Wt 165.0 lb

## 2018-03-04 DIAGNOSIS — Z79899 Other long term (current) drug therapy: Secondary | ICD-10-CM | POA: Diagnosis not present

## 2018-03-04 DIAGNOSIS — R7309 Other abnormal glucose: Secondary | ICD-10-CM

## 2018-03-04 DIAGNOSIS — E559 Vitamin D deficiency, unspecified: Secondary | ICD-10-CM | POA: Diagnosis not present

## 2018-03-04 DIAGNOSIS — R0989 Other specified symptoms and signs involving the circulatory and respiratory systems: Secondary | ICD-10-CM | POA: Diagnosis not present

## 2018-03-04 DIAGNOSIS — E782 Mixed hyperlipidemia: Secondary | ICD-10-CM | POA: Diagnosis not present

## 2018-03-04 LAB — LIPID PANEL
CHOLESTEROL: 279 mg/dL — AB (ref <200)
HDL: 111 mg/dL (ref >50)
LDL Cholesterol (Calc): 152 mg/dL (calc) — ABNORMAL HIGH
Non-HDL Cholesterol (Calc): 168 mg/dL (calc) — ABNORMAL HIGH (ref <130)
Total CHOL/HDL Ratio: 2.5 (calc) (ref <5.0)
Triglycerides: 56 mg/dL (ref <150)

## 2018-03-04 NOTE — Progress Notes (Signed)
This very nice 72 y.o.  DBF presents for  follow up with HTN, HLD, Pre-Diabetes, Hypothyroisism and Vitamin D Deficiency. Patient was here 1 month ago for a Medicare Wellness Visit 1 month ago and had full labs done at that time.      Patient is followed expectantly for labile HTN circa 2012 & BP has been controlled at home. Today's BP is at goal -  116/76. Patient has had no complaints of any cardiac type chest pain, palpitations, dyspnea / orthopnea / PND, dizziness, claudication, or dependent edema.     Hyperlipidemia is not controlled with diet as she is reticent to take meds for Cholesterol and has stopped her Crestor. Last Lipids were not at goal: Lab Results  Component Value Date   CHOL 238 (H) 05/26/2017   HDL 115 05/26/2017   LDLCALC 109 (H) 05/26/2017   TRIG 48 05/26/2017   CHOLHDL 2.1 05/26/2017      Patient was treated for toxic MNG w/I-131 by Dr Cruzita Lederer in 9/2015and patient is on thyroid replacement.      Also, the patient has history of PreDiabetes (A1c 5.7%/2012) and has had no symptoms of reactive hypoglycemia, diabetic polys, paresthesias or visual blurring.  Last A1c was normal & at goal: Lab Results  Component Value Date   HGBA1C 5.1 02/04/2017      Further, the patient also has history of Vitamin D Deficiency ("10"/2010) and does not supplement vitamin D as repeatedly advised. Last vitamin D was   Lab Results  Component Value Date   VD25OH 12 (L) 02/05/2018   Current Outpatient Medications on File Prior to Visit  Medication Sig  . butalbital-acetaminophen-caffeine (FIORICET, ESGIC) 50-325-40 MG tablet Take 1 tablet 4 x / day if needed for headache  . levothyroxine (SYNTHROID, LEVOTHROID) 50 MCG tablet Please take alternate 1 with 1.5 tablets every other day  . meclizine (ANTIVERT) 25 MG tablet Take 1 tablet 3 x d if needed for Dizziness / Vertigo  . naproxen sodium (ANAPROX) 220 MG tablet Take 440 mg by mouth 2 (two) times daily as needed (pain).  .  [DISCONTINUED] amitriptyline (ELAVIL) 25 MG tablet 1-2 at night for headache (Patient not taking: Reported on 11/07/2015)   No current facility-administered medications on file prior to visit.    Allergies  Allergen Reactions  . Penicillins Hives  . Pravastatin     Myalgia  . Sulfa Antibiotics Hives  . Zetia [Ezetimibe]     Myalgia   PMHx:   Past Medical History:  Diagnosis Date  . Blind right eye    legally  . 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 Hypertension   . Migraine   . Prediabetes   . Sarcoidosis, lung ([place]) 15 YRS AGO   NO TREATMENTS  . 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 Vitamin D deficiency    Immunization History  Administered Date(s) Administered  . Influenza Split 06/30/2013  . Influenza, High Dose Seasonal PF 06/26/2015, 07/25/2016, 05/26/2017  . Pneumococcal Conjugate-13 09/14/2014  . Pneumococcal Polysaccharide-23 10/16/2011, 02/04/2017  . Td 10/16/2011  . Zoster 01/14/2007   Past Surgical History:  Procedure Laterality Date  . BACK SURGERY  1970  . COLONOSCOPY  2006; 03/28/11   2 small adenomas; normal  . EYE SURGERY  1964   right eye  . KNEE ARTHROSCOPY  2002  . NASAL SEPTUM SURGERY  1998  . TOTAL ABDOMINAL HYSTERECTOMY  2000   FHx:    Reviewed / unchanged  SHx:  Reviewed / unchanged   Systems Review:  Constitutional: Denies fever, chills, wt changes, headaches, insomnia, fatigue, night sweats, change in appetite. Eyes: Denies redness, blurred vision, diplopia, discharge, itchy, watery eyes.  ENT: Denies discharge, congestion, post nasal drip, epistaxis, sore throat, earache, hearing loss, dental pain, tinnitus, vertigo, sinus pain, snoring.  CV: Denies chest pain, palpitations, irregular heartbeat, syncope, dyspnea, diaphoresis, orthopnea, PND, claudication or edema. Respiratory: denies cough, dyspnea, DOE, pleurisy, hoarseness, laryngitis, wheezing.  Gastrointestinal: Denies dysphagia, odynophagia, heartburn, reflux, water brash,  abdominal pain or cramps, nausea, vomiting, bloating, diarrhea, constipation, hematemesis, melena, hematochezia  or hemorrhoids. Genitourinary: Denies dysuria, frequency, urgency, nocturia, hesitancy, discharge, hematuria or flank pain. Musculoskeletal: Denies arthralgias, myalgias, stiffness, jt. swelling, pain, limping or strain/sprain.  Skin: Denies pruritus, rash, hives, warts, acne, eczema or change in skin lesion(s). Neuro: No weakness, tremor, incoordination, spasms, paresthesia or pain. Psychiatric: Denies confusion, memory loss or sensory loss. Endo: Denies change in weight, skin or hair change.  Heme/Lymph: No excessive bleeding, bruising or enlarged lymph nodes.  Physical Exam  BP 116/76   Pulse 76   Temp 97.7 F (36.5 C)   Resp 16   Ht 5\' 6"  (1.676 m)   Wt 165 lb (74.8 kg)   BMI 26.63 kg/m   Appears  well nourished, well groomed  and in no distress.  Eyes: PERRLA, EOMs, conjunctiva no swelling or erythema. Sinuses: No frontal/maxillary tenderness ENT/Mouth: EAC's clear, TM's nl w/o erythema, bulging. Nares clear w/o erythema, swelling, exudates. Oropharynx clear without erythema or exudates. Oral hygiene is good. Tongue normal, non obstructing. Hearing intact.  Neck: Supple. Thyroid not palpable. Car 2+/2+ without bruits, nodes or JVD. Chest: Respirations nl with BS clear & equal w/o rales, rhonchi, wheezing or stridor.  Cor: Heart sounds normal w/ regular rate and rhythm without sig. murmurs, gallops, clicks or rubs. Peripheral pulses normal and equal  without edema.  Abdomen: Soft & bowel sounds normal. Non-tender w/o guarding, rebound, hernias, masses or organomegaly.  Lymphatics: Unremarkable.  Musculoskeletal: Full ROM all peripheral extremities, joint stability, 5/5 strength and normal gait.  Skin: Warm, dry without exposed rashes, lesions or ecchymosis apparent.  Neuro: Cranial nerves intact, reflexes equal bilaterally. Sensory-motor testing grossly intact. Tendon  reflexes grossly intact.  Pysch: Alert & oriented x 3.  Insight and judgement nl & appropriate. No ideations.  Assessment and Plan:  1. Labile hypertension - Continue  monitor blood pressures.  - Continue DASH diet.  Reminder to go to the ER if any CP,  SOB, nausea, dizziness, severe HA, changes vision/speech.  2. Hyperlipidemia, mixed - Continue diet/meds, exercise,& lifestyle modifications.  - Continue monitor periodic cholesterol/liver & renal functions   - Lipid panel  3. Abnormal glucose - Continue diet, exercise, lifestyle modifications.  - Monitor appropriate labs.  4. Vitamin D deficiency - Continue supplementation.         Discussed  regular exercise, BP monitoring, weight control to achieve/maintain BMI less than 25 and discussed med and SE's. Recommended labs to assess and monitor clinical status with further disposition pending results of labs. Over 30 minutes of exam, counseling, chart review was performed.

## 2018-03-04 NOTE — Patient Instructions (Signed)

## 2018-03-05 ENCOUNTER — Other Ambulatory Visit: Payer: Self-pay | Admitting: Internal Medicine

## 2018-03-05 MED ORDER — ROSUVASTATIN CALCIUM 40 MG PO TABS: ORAL_TABLET | ORAL | 5 refills | Status: DC

## 2018-03-08 ENCOUNTER — Encounter: Payer: Self-pay | Admitting: Internal Medicine

## 2018-03-12 DIAGNOSIS — Z961 Presence of intraocular lens: Secondary | ICD-10-CM | POA: Insufficient documentation

## 2018-03-15 ENCOUNTER — Other Ambulatory Visit: Payer: Self-pay | Admitting: Internal Medicine

## 2018-04-17 ENCOUNTER — Institutional Professional Consult (permissible substitution): Payer: Medicare Other | Admitting: Diagnostic Neuroimaging

## 2018-05-30 ENCOUNTER — Other Ambulatory Visit: Payer: Self-pay | Admitting: Internal Medicine

## 2018-06-02 NOTE — Telephone Encounter (Signed)
Okay to refill the correct dose.  Of note, she has 2 doses on her medication list.  Please delete the one that is not correct.

## 2018-06-02 NOTE — Telephone Encounter (Signed)
Per previous message it seemed that pt is following up with her PCP for this, Is this okay to refill?

## 2018-06-04 ENCOUNTER — Ambulatory Visit: Payer: Self-pay | Admitting: Internal Medicine

## 2018-06-10 ENCOUNTER — Ambulatory Visit: Payer: Medicare Other | Admitting: Diagnostic Neuroimaging

## 2018-06-10 ENCOUNTER — Encounter: Payer: Self-pay | Admitting: Diagnostic Neuroimaging

## 2018-06-10 VITALS — BP 119/72 | HR 83 | Ht 66.0 in | Wt 166.2 lb

## 2018-06-10 DIAGNOSIS — R200 Anesthesia of skin: Secondary | ICD-10-CM

## 2018-06-10 DIAGNOSIS — R202 Paresthesia of skin: Secondary | ICD-10-CM

## 2018-06-10 NOTE — Progress Notes (Signed)
[facility]  PATIENT: Laura Maynard DOB: 1945-10-30  REFERRING CLINICIAN: Essie Christine HISTORY FROM: patient  REASON FOR VISIT: new consult    HISTORICAL  CHIEF COMPLAINT:  Chief Complaint  Patient presents with  . New Patient (Initial Visit)    Last seen by you 03/2014 for migraines.   . Numbness, Tingling hands    Vicie Mutters, [place]  . Extremity Weakness    HISTORY OF PRESENT ILLNESS:   NEW HPI (06/10/18): 72 year old female here for evaluation of numbness, weakness, tingling, dizziness and blurred vision. For past 3 months patient has had intermittent and progressive numbness and tingling episodes.  These can affect hands or feet.  Symptoms can last 5 minutes at a time.  No specific triggering or aggravating factors. Headaches are stable.  Patient uses Fioricet 4 times per month as needed.  UPDATE 04/04/14: Patient presents for a new symptom, visual disturbance. 72 year old female with history of migraine headaches, pulmonary sarcoidosis, hypercholesterolemia, who is been having intermittent visual disturbance in right visual field for past 2 years. Patient describes seeing a flash of "C - shape" for just one second. This happens up to 10 times per day. She can't tell for sure if this is coming from her right or left eye. The last 2 weeks she is seen a similar smaller visual disturbance in her left visual field. Patient is seen her eye doctor who found no specific intraocular pathology or explanation. These episodes are not associated with her typical migraines. Patient continues to have one migraine every 3-4 months. He seemed to be fairly well-controlled sumatriptan.  Patient also had episode of dizziness, nausea, vomiting, migraine, in 2013, went to the emergency room had CT and MRI which are unremarkable. Patient has been using meclizine as needed for vertigo spells.  PRIOR HPI (05/08/10): 72 year old right-handed female with history of hypercholesterolemia,  pulmonary sarcoidosis (not active), migraine headaches presenting for evaluation of worsening headaches. Patient has a long history of migraine headaches from her teenage years. Initially headaches were unilateral, throbbing, associated with nausea and vomiting as well as photophobia and phonophobia. No aura or visual symptoms. She hasn't noted specific triggers related to strong perfume or specific kinds of foods in the past. These would trigger migraine headaches during the daytime. She typically had one severe migraine headache every 3 months. She was seeking treatment by visiting the emergency room or her primary care doctor and receiving various injections. 3 months ago she started on Sumavel dosepro which did work to [employer] the severe migraine attacks.  In addition she was using Fioricet tabs p.r.n. mild migraine headaches. She has been on Fioricet for many years. In the last 2 months she has increased the frequency of Fioricet up to 3 days per week (one tab per day).  In the past 2 months patient has had mild unilateral headaches which she wakes up with, without nausea or vomiting, for which she takes a Fioricet and within 20 minutes her headaches go away. Patient was diagnoses with pulmonary sarcoidosis in the 1990s (incidental finding on CXR, then biopsy proven), treated with steroids for approximately one year. Followup chest x-ray showed no signs of active disease.  No current pulmonary symptoms.  No current tx for sarcoidosis.  REVIEW OF SYSTEMS: Full 14 system review of systems performed and negative with exception of: Sleepiness headache dizziness feeling hot blurred vision joint pain decreased energy allergies runny nose.  ALLERGIES: Allergies  Allergen Reactions  . Penicillins Hives  . Pravastatin  Myalgia  . Sulfa Antibiotics Hives  . Zetia [Ezetimibe]     Myalgia    HOME MEDICATIONS: Outpatient Medications Prior to Visit  Medication Sig Dispense Refill  . Acetaminophen  (TYLENOL) 325 MG CAPS Tylenol 325 mg capsule  Take by oral route.    . Aspirin-Acetaminophen-Caffeine (GOODY HEADACHE PO) Take by mouth. Takes prn    . butalbital-acetaminophen-caffeine (FIORICET, ESGIC) 50-325-40 MG tablet Take 1 tablet 4 x / day if needed for headache 20 tablet 0  . cetirizine-pseudoephedrine (ZYRTEC-D) 5-120 MG tablet Take by mouth.    . levothyroxine (SYNTHROID, LEVOTHROID) 50 MCG tablet TAKE 1 AND ONE-HALF TABLETS BY MOUTH DAILY (Patient taking differently: TAKE 1 AND ONE-HALF TABLETS BY MOUTH DAILY.  Pt states alternating 7mcg and 3mcg every other day.) 90 tablet 0  . meclizine (ANTIVERT) 25 MG tablet Take 1 tablet 3 x d if needed for Dizziness / Vertigo 100 tablet 1  . naproxen sodium (ANAPROX) 220 MG tablet Take 440 mg by mouth 2 (two) times daily as needed (pain).    . rosuvastatin (CRESTOR) 40 MG tablet Take 1/2 to 1 tablet daily or as directed for Cholesterol (Patient taking differently: Take 1/2 to 1 tablet daily or as directed for Cholesterol.  Taking 3 x a week.) 30 tablet 5   No facility-administered medications prior to visit.     PAST MEDICAL HISTORY: Past Medical History:  Diagnosis Date  . Blind right eye    legally  . 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 High cholesterol   . Hypertension   . Hypothyroidism   . Migraine   . Prediabetes   . Sarcoidosis, lung ([place]) 15 YRS AGO   NO TREATMENTS  . 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 Vertigo   . Vitamin D deficiency     PAST SURGICAL HISTORY: Past Surgical History:  Procedure Laterality Date  . BACK SURGERY  1970  . CATARACT EXTRACTION Left   . COLONOSCOPY  2006; 03/28/11   2 small adenomas; normal  . EYE SURGERY  1964   right eye  . KNEE ARTHROSCOPY  2002  . NASAL SEPTUM SURGERY  1998  . TOTAL ABDOMINAL HYSTERECTOMY  2000    FAMILY HISTORY: Family History  Problem Relation Age of Onset  . Lung cancer Sister 53  . Throat cancer Brother 30  . Stroke Mother   . Emphysema Father     . COPD Sister   . Diabetes Brother   . Colon cancer Neg Hx     SOCIAL HISTORY: Social History   Socioeconomic History  . Marital status: Divorced    Spouse name: Not on file  . Number of children: 0  . Years of education: college  . Highest education level: Not on file  Occupational History    Comment: Retired  Scientific laboratory technician  . Financial resource strain: Not on file  . Food insecurity:    Worry: Not on file    Inability: Not on file  . Transportation needs:    Medical: Not on file    Non-medical: Not on file  Tobacco Use  . Smoking status: Never Smoker  . Smokeless tobacco: Never Used  Substance and Sexual Activity  . Alcohol use: Never    Alcohol/week: 1.0 standard drinks    Types: 1 Standard drinks or equivalent per week    Frequency: Never    Comment: RARE  . Drug use: Never  . Sexual activity: Not on file  Lifestyle  . Physical activity:  Days per week: Not on file    Minutes per session: Not on file  . Stress: Not on file  Relationships  . Social connections:    Talks on phone: Not on file    Gets together: Not on file    Attends religious service: Not on file    Active member of club or organization: Not on file    Attends meetings of clubs or organizations: Not on file    Relationship status: Not on file  . Intimate partner violence:    Fear of current or ex partner: Not on file    Emotionally abused: Not on file    Physically abused: Not on file    Forced sexual activity: Not on file  Other Topics Concern  . Not on file  Social History Narrative   Patient is retired and lives at home alone. Patient  has college education.   Caffeine- 3 cups of caffeine daily.  One soda daily.   Right handed.     PHYSICAL EXAM  GENERAL EXAM/CONSTITUTIONAL: Vitals:  Vitals:   06/10/18 0838  BP: 119/72  Pulse: 83  Weight: 166 lb 3.2 oz (75.4 kg)  Height: 5\' 6"  (1.676 m)     Body mass index is 26.83 kg/m. Wt Readings from Last 3 Encounters:   06/10/18 166 lb 3.2 oz (75.4 kg)  03/04/18 165 lb (74.8 kg)  02/05/18 165 lb 12.8 oz (75.2 kg)     Patient is in no distress; well developed, nourished and groomed; neck is supple  CARDIOVASCULAR:  Examination of carotid arteries is normal; no carotid bruits  Regular rate and rhythm, no murmurs  Examination of peripheral vascular system by observation and palpation is normal  EYES:  Ophthalmoscopic exam --> RIGHT EYE POST-SURGICAL PUPIL, NO REACTION; LEFT PUPIL PINPOINT NO REACTION  Visual Acuity Screening   Right eye Left eye Both eyes  Without correction:     With correction: 0 20/20   Comments: Legally blind in R eye.      MUSCULOSKELETAL:  Gait, strength, tone, movements noted in Neurologic exam below  NEUROLOGIC: MENTAL STATUS:  No flowsheet data found.  awake, alert, oriented to person, place and time  recent and remote memory intact  normal attention and concentration  language fluent, comprehension intact, naming intact  fund of knowledge appropriate  CRANIAL NERVE:   2nd - fundoscopic exam --> RIGHT EYE POST-SURGICAL PUPIL, NO REACTION; LEFT PUPIL PINPOINT NO REACTION  2nd, 3rd, 4th, 6th - pupils POST-SURGICAL; visual fields full to confrontation ON LEFT EYE; RIGHT EYE DECR VISION (ON LIGHT PERCEPTION AND MOVEMENT), extraocular muscles intact, no nystagmus  5th - facial sensation symmetric  7th - facial strength symmetric  8th - hearing intact  9th - palate elevates symmetrically, uvula midline  11th - shoulder shrug symmetric  12th - tongue protrusion midline  MOTOR:   normal bulk and tone, full strength in the BUE, BLE  SENSORY:   normal and symmetric to light touch, vibration and pinprick; DECR TEMP IN FEET  BORDERLINE PHALENS AND TINELS (LEFT THUMB)  COORDINATION:   finger-nose-finger, fine finger movements normal  REFLEXES:   deep tendon reflexes TRACE and symmetric; ABSENT AT ANKLES  GAIT/STATION:   narrow based  gait     DIAGNOSTIC DATA (LABS, IMAGING, TESTING) - I reviewed patient records, labs, notes, testing and imaging myself where available.  Lab Results  Component Value Date   WBC 5.8 02/05/2018   HGB 11.8 02/05/2018   HCT 35.8 02/05/2018  MCV 82.7 02/05/2018   PLT 391 02/05/2018      Component Value Date/Time   NA 141 02/05/2018 1138   K 4.4 02/05/2018 1138   CL 106 02/05/2018 1138   CO2 27 02/05/2018 1138   GLUCOSE 87 02/05/2018 1138   BUN 12 02/05/2018 1138   CREATININE 0.76 02/05/2018 1138   CALCIUM 9.5 02/05/2018 1138   PROT 6.6 02/05/2018 1138   ALBUMIN 4.4 02/04/2017 1550   AST 30 02/05/2018 1138   ALT 16 02/05/2018 1138   ALKPHOS 82 02/04/2017 1550   BILITOT 0.4 02/05/2018 1138   GFRNONAA 79 02/05/2018 1138   GFRAA 91 02/05/2018 1138   Lab Results  Component Value Date   CHOL 279 (H) 03/04/2018   HDL 111 03/04/2018   LDLCALC 152 (H) 03/04/2018   TRIG 56 03/04/2018   CHOLHDL 2.5 03/04/2018   Lab Results  Component Value Date   HGBA1C 5.1 02/04/2017   Lab Results  Component Value Date   VITAMINB12 488 02/05/2018   Lab Results  Component Value Date   TSH 2.69 02/05/2018    11/07/15 CT head [I reviewed images myself and agree with interpretation. -VRP]  - No acute intracranial pathology.   ASSESSMENT AND PLAN  72 y.o. year old female here with new onset intermittent numbness and tingling in the hands and left foot.  We will proceed with further testing.  Dx:  1. Hand numbness   2. Numbness and tingling of both feet      PLAN:  HAND NUMBNESS (intermittent) - check EMG/NCS (eval for carpal tunnel syndrome)  LEG / FOOT NUMBNESS  - check EMG/NCS (eval for neuropathy, lumbar radiculopathy)  POSITIVE ANA - follow up positive ANA with PCP or rheumatology (patient also has history of pulmonary sarcoidosis)  Orders Placed This Encounter  Procedures  . NCV with EMG(electromyography)   Return for for NCV/EMG.    Penni Bombard, MD  6/70/1410, 3:01 AM Certified in Neurology, Neurophysiology and Neuroimaging  [facility] [address], [place],  [address] [phone]

## 2018-06-10 NOTE — Patient Instructions (Signed)
HAND NUMBNESS (intermittent) - check EMG/NCS (eval for carpal tunnel syndrome)  LEG / FOOT NUMBNESS  - check EMG/NCS (eval for neuropathy)  POSITIVE ANA - follow up positive ANA with PCP or rheumatology

## 2018-06-14 ENCOUNTER — Encounter: Payer: Self-pay | Admitting: Internal Medicine

## 2018-06-14 DIAGNOSIS — Z8249 Family history of ischemic heart disease and other diseases of the circulatory system: Secondary | ICD-10-CM | POA: Insufficient documentation

## 2018-06-14 NOTE — Progress Notes (Signed)
This very nice 72 y.o. DBF presents for 3 month follow up with HTN, HLD, Pre-Diabetes and Vitamin D Deficiency.      Patient is followed expectantly for labile HTN (2012) & BP has been controlled at home. Today's BP is at goal - 118/72. Patient has had no complaints of any cardiac type chest pain, palpitations, dyspnea / orthopnea / PND, dizziness, claudication, or dependent edema.     Hyperlipidemia is not controlled with diet & meds. Patient denies myalgias or other med SE's. Last Lipids were  Lab Results  Component Value Date   CHOL 279 (H) 03/04/2018   HDL 111 03/04/2018   LDLCALC 152 (H) 03/04/2018   TRIG 56 03/04/2018   CHOLHDL 2.5 03/04/2018      Also, the patient has history of PreDiabetes (A1c 5.7%/2012)  and has had no symptoms of reactive hypoglycemia, diabetic polys, paresthesias or visual blurring.  Last A1c was Normal & at goal: Lab Results  Component Value Date   HGBA1C 5.1 02/04/2017        Patient was treated w/I-131 for toxic MNG  by Dr Cruzita Lederer in 05/2014 and patient is followed by her for thyroid replacement.      Further, the patient also has history of Vitamin D Deficiency ("10"/2010) and she does not supplements vitamin D as repeatedly recommended. Last vitamin D was still very low: Lab Results  Component Value Date   VD25OH 12 (L) 02/05/2018   Current Outpatient Medications on File Prior to Visit  Medication Sig  . Acetaminophen (TYLENOL) 325 MG CAPS Tylenol 325 mg capsule  Take by oral route.  . Aspirin-Acetaminophen-Caffeine (GOODY HEADACHE PO) Take by mouth. Takes prn  . butalbital-acetaminophen-caffeine (FIORICET, ESGIC) 50-325-40 MG tablet Take 1 tablet 4 x / day if needed for headache  . cetirizine-pseudoephedrine (ZYRTEC-D) 5-120 MG tablet Take by mouth.  . levothyroxine (SYNTHROID, LEVOTHROID) 50 MCG tablet TAKE 1 AND ONE-HALF TABLETS BY MOUTH DAILY (Patient taking differently: TAKE 1 AND ONE-HALF TABLETS BY MOUTH DAILY.  Pt states alternating  41mcg and 11mcg every other day.)  . meclizine (ANTIVERT) 25 MG tablet Take 1 tablet 3 x d if needed for Dizziness / Vertigo  . naproxen sodium (ANAPROX) 220 MG tablet Take 440 mg by mouth 2 (two) times daily as needed (pain).  . rosuvastatin (CRESTOR) 40 MG tablet Take 1/2 to 1 tablet daily or as directed for Cholesterol (Patient taking differently: Take 1/2 to 1 tablet daily or as directed for Cholesterol.  Taking 3 x a week.)   No current facility-administered medications on file prior to visit.    Allergies  Allergen Reactions  . Penicillins Hives  . Pravastatin     Myalgia  . Sulfa Antibiotics Hives  . Zetia [Ezetimibe]     Myalgia   PMHx:   Past Medical History:  Diagnosis Date  . Blind right eye    legally  . 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 High cholesterol   . Hypertension   . Hypothyroidism   . Migraine   . Prediabetes   . Sarcoidosis, lung ([place]) 15 YRS AGO   NO TREATMENTS  . 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 Vertigo   . Vitamin D deficiency    Immunization History  Administered Date(s) Administered  . Influenza Split 06/30/2013  . Influenza, High Dose Seasonal PF 06/26/2015, 07/25/2016, 05/26/2017  . Pneumococcal Conjugate-13 09/14/2014  . Pneumococcal Polysaccharide-23 10/16/2011, 02/04/2017  . Td 10/16/2011  . Zoster 01/14/2007   Past Surgical  History:  Procedure Laterality Date  . BACK SURGERY  1970  . CATARACT EXTRACTION Left   . COLONOSCOPY  2006; 03/28/11   2 small adenomas; normal  . EYE SURGERY  1964   right eye  . KNEE ARTHROSCOPY  2002  . NASAL SEPTUM SURGERY  1998  . TOTAL ABDOMINAL HYSTERECTOMY  2000   FHx:    Reviewed / unchanged  SHx:    Reviewed / unchanged   Systems Review:  Constitutional: Denies fever, chills, wt changes, headaches, insomnia, fatigue, night sweats, change in appetite. Eyes: Denies redness, blurred vision, diplopia, discharge, itchy, watery eyes.  ENT: Denies discharge, congestion, post nasal drip,  epistaxis, sore throat, earache, hearing loss, dental pain, tinnitus, vertigo, sinus pain, snoring.  CV: Denies chest pain, palpitations, irregular heartbeat, syncope, dyspnea, diaphoresis, orthopnea, PND, claudication or edema. Respiratory: denies cough, dyspnea, DOE, pleurisy, hoarseness, laryngitis, wheezing.  Gastrointestinal: Denies dysphagia, odynophagia, heartburn, reflux, water brash, abdominal pain or cramps, nausea, vomiting, bloating, diarrhea, constipation, hematemesis, melena, hematochezia  or hemorrhoids. Genitourinary: Denies dysuria, frequency, urgency, nocturia, hesitancy, discharge, hematuria or flank pain. Musculoskeletal: Denies arthralgias, myalgias, stiffness, jt. swelling, pain, limping or strain/sprain.  Skin: Denies pruritus, rash, hives, warts, acne, eczema or change in skin lesion(s). Neuro: No weakness, tremor, incoordination, spasms, paresthesia or pain. Psychiatric: Denies confusion, memory loss or sensory loss. Endo: Denies change in weight, skin or hair change.  Heme/Lymph: No excessive bleeding, bruising or enlarged lymph nodes.  Physical Exam  BP 118/72   Pulse 84   Temp (!) 97.5 F (36.4 C)   Resp 16   Ht 5\' 6"  (1.676 m)   Wt 166 lb (75.3 kg)   BMI 26.79 kg/m   Appears  well nourished, well groomed  and in no distress.  Eyes: PERRLA, EOMs, conjunctiva no swelling or erythema. Sinuses: No frontal/maxillary tenderness ENT/Mouth: EAC's clear, TM's nl w/o erythema, bulging. Nares clear w/o erythema, swelling, exudates. Oropharynx clear without erythema or exudates. Oral hygiene is good. Tongue normal, non obstructing. Hearing intact.  Neck: Supple. Thyroid not palpable. Car 2+/2+ without bruits, nodes or JVD. Chest: Respirations nl with BS clear & equal w/o rales, rhonchi, wheezing or stridor.  Cor: Heart sounds normal w/ regular rate and rhythm without sig. murmurs, gallops, clicks or rubs. Peripheral pulses normal and equal  without edema.  Abdomen:  Soft & bowel sounds normal. Non-tender w/o guarding, rebound, hernias, masses or organomegaly.  Lymphatics: Unremarkable.  Musculoskeletal: Full ROM all peripheral extremities, joint stability, 5/5 strength and normal gait.  Skin: Warm, dry without exposed rashes, lesions or ecchymosis apparent.  Neuro: Cranial nerves intact, reflexes equal bilaterally. Sensory-motor testing grossly intact. Tendon reflexes grossly intact.  Pysch: Alert & oriented x 3.  Insight and judgement nl & appropriate. No ideations.  Assessment and Plan:  1. Labile hypertension  - Continue medication, monitor blood pressure at home.  - Continue DASH diet.  Reminder to go to the ER if any CP,  SOB, nausea, dizziness, severe HA, changes vision/speech.  - CBC with Differential/Platelet - COMPLETE METABOLIC PANEL WITH GFR - Magnesium  2. Hyperlipidemia, mixed  - Continue diet/meds, exercise,& lifestyle modifications.  - Continue monitor periodic cholesterol/liver & renal functions   - Lipid panel  3. Abnormal glucose  - Continue diet, exercise, lifestyle modifications.  - Monitor appropriate labs.  - Hemoglobin A1c - Insulin, random  4. Vitamin D deficiency  - Continue supplementation.  - VITAMIN D 25 Hydroxyl  5. Prediabetes  - Hemoglobin A1c - Insulin, random  6. Postablative hypothyroidism  - TSH  7. Medication management  - CBC with Differential/Platelet - COMPLETE METABOLIC PANEL WITH GFR - Magnesium - Lipid panel - Hemoglobin A1c - Insulin, random - VITAMIN D 25 Hydroxyl       Discussed  regular exercise, BP monitoring, weight control to achieve/maintain BMI less than 25 and discussed med and SE's. Recommended labs to assess and monitor clinical status with further disposition pending results of labs. Over 30 minutes of exam, counseling, chart review was performed.

## 2018-06-14 NOTE — Patient Instructions (Signed)

## 2018-06-15 ENCOUNTER — Ambulatory Visit (HOSPITAL_COMMUNITY)
Admission: RE | Admit: 2018-06-15 | Discharge: 2018-06-15 | Disposition: A | Discharge status: Home / Self Care | Payer: Medicare Other | Source: Ambulatory Visit | Attending: Internal Medicine | Admitting: Internal Medicine

## 2018-06-15 ENCOUNTER — Ambulatory Visit: Payer: Medicare Other | Admitting: Internal Medicine

## 2018-06-15 ENCOUNTER — Other Ambulatory Visit: Payer: Self-pay | Admitting: Internal Medicine

## 2018-06-15 VITALS — BP 118/72 | HR 84 | Temp 97.5°F | Resp 16 | Ht 66.0 in | Wt 166.0 lb

## 2018-06-15 DIAGNOSIS — R0989 Other specified symptoms and signs involving the circulatory and respiratory systems: Secondary | ICD-10-CM

## 2018-06-15 DIAGNOSIS — R7309 Other abnormal glucose: Secondary | ICD-10-CM

## 2018-06-15 DIAGNOSIS — Z79899 Other long term (current) drug therapy: Secondary | ICD-10-CM

## 2018-06-15 DIAGNOSIS — E559 Vitamin D deficiency, unspecified: Secondary | ICD-10-CM

## 2018-06-15 DIAGNOSIS — E782 Mixed hyperlipidemia: Secondary | ICD-10-CM

## 2018-06-15 DIAGNOSIS — E890 Postprocedural hypothyroidism: Secondary | ICD-10-CM

## 2018-06-15 DIAGNOSIS — I10 Essential (primary) hypertension: Secondary | ICD-10-CM | POA: Insufficient documentation

## 2018-06-15 DIAGNOSIS — R7303 Prediabetes: Secondary | ICD-10-CM

## 2018-06-15 DIAGNOSIS — D869 Sarcoidosis, unspecified: Secondary | ICD-10-CM | POA: Insufficient documentation

## 2018-06-15 MED ORDER — BUTALBITAL-APAP-CAFFEINE 50-325-40 MG PO TABS: ORAL_TABLET | ORAL | 0 refills | Status: DC

## 2018-06-16 LAB — COMPLETE METABOLIC PANEL WITH GFR
AG RATIO: 2 (calc) (ref 1.0–2.5)
ALBUMIN MSPROF: 4.5 g/dL (ref 3.6–5.1)
ALKALINE PHOSPHATASE (APISO): 86 U/L (ref 33–130)
ALT: 12 U/L (ref 6–29)
AST: 21 U/L (ref 10–35)
BILIRUBIN TOTAL: 0.3 mg/dL (ref 0.2–1.2)
BUN: 16 mg/dL (ref 7–25)
CO2: 29 mmol/L (ref 20–32)
Calcium: 9.5 mg/dL (ref 8.6–10.4)
Chloride: 106 mmol/L (ref 98–110)
Creat: 0.82 mg/dL (ref 0.60–0.93)
GFR, Est African American: 83 mL/min/{1.73_m2} (ref >=60)
GFR, Est Non African American: 72 mL/min/{1.73_m2} (ref >=60)
Globulin: 2.3 g/dL (calc) (ref 1.9–3.7)
Glucose, Bld: 86 mg/dL (ref 65–99)
POTASSIUM: 4.4 mmol/L (ref 3.5–5.3)
SODIUM: 141 mmol/L (ref 135–146)
Total Protein: 6.8 g/dL (ref 6.1–8.1)

## 2018-06-16 LAB — CBC WITH DIFFERENTIAL/PLATELET
BASOS PCT: 0.8 %
Basophils Absolute: 38 cells/uL (ref 0–200)
EOS ABS: 61 {cells}/uL (ref 15–500)
Eosinophils Relative: 1.3 %
HCT: 36.6 % (ref 35.0–45.0)
HEMOGLOBIN: 11.9 g/dL (ref 11.7–15.5)
Lymphs Abs: 1932 cells/uL (ref 850–3900)
MCH: 27.4 pg (ref 27.0–33.0)
MCHC: 32.5 g/dL (ref 32.0–36.0)
MCV: 84.1 fL (ref 80.0–100.0)
MPV: 9.2 fL (ref 7.5–12.5)
Monocytes Relative: 10.2 %
NEUTROS ABS: 2190 {cells}/uL (ref 1500–7800)
Neutrophils Relative %: 46.6 %
Platelets: 379 10*3/uL (ref 140–400)
RBC: 4.35 10*6/uL (ref 3.80–5.10)
RDW: 13.1 % (ref 11.0–15.0)
Total Lymphocyte: 41.1 %
WBC: 4.7 10*3/uL (ref 3.8–10.8)
WBCMIX: 479 {cells}/uL (ref 200–950)

## 2018-06-16 LAB — HEMOGLOBIN A1C
Hgb A1c MFr Bld: 5.3 % of total Hgb (ref <5.7)
Mean Plasma Glucose: 105 (calc)
eAG (mmol/L): 5.8 (calc)

## 2018-06-16 LAB — INSULIN, RANDOM: INSULIN: 6.1 u[IU]/mL (ref 2.0–19.6)

## 2018-06-16 LAB — LIPID PANEL
Cholesterol: 264 mg/dL — ABNORMAL HIGH (ref <200)
HDL: 112 mg/dL (ref >50)
LDL CHOLESTEROL (CALC): 136 mg/dL — AB
Non-HDL Cholesterol (Calc): 152 mg/dL (calc) — ABNORMAL HIGH (ref <130)
Total CHOL/HDL Ratio: 2.4 (calc) (ref <5.0)
Triglycerides: 70 mg/dL (ref <150)

## 2018-06-16 LAB — TSH: TSH: 1.93 m[IU]/L (ref 0.40–4.50)

## 2018-06-16 LAB — MAGNESIUM: MAGNESIUM: 2.2 mg/dL (ref 1.5–2.5)

## 2018-06-30 ENCOUNTER — Ambulatory Visit (INDEPENDENT_AMBULATORY_CARE_PROVIDER_SITE_OTHER): Payer: Medicare Other

## 2018-06-30 DIAGNOSIS — Z23 Encounter for immunization: Secondary | ICD-10-CM | POA: Diagnosis not present

## 2018-07-13 LAB — HM MAMMOGRAPHY

## 2018-07-16 ENCOUNTER — Encounter: Payer: Self-pay | Admitting: *Deleted

## 2018-07-16 ENCOUNTER — Encounter: Payer: Medicare Other | Admitting: Diagnostic Neuroimaging

## 2018-08-12 ENCOUNTER — Other Ambulatory Visit: Payer: Self-pay | Admitting: Internal Medicine

## 2018-09-17 ENCOUNTER — Encounter: Payer: Self-pay | Admitting: Internal Medicine

## 2018-09-17 ENCOUNTER — Encounter: Payer: Self-pay | Admitting: Physician Assistant

## 2018-09-17 ENCOUNTER — Ambulatory Visit (INDEPENDENT_AMBULATORY_CARE_PROVIDER_SITE_OTHER): Payer: Medicare Other | Admitting: Internal Medicine

## 2018-09-17 VITALS — BP 122/68 | HR 88 | Temp 97.0°F | Resp 16 | Ht 66.0 in | Wt 168.2 lb

## 2018-09-17 DIAGNOSIS — Z0000 Encounter for general adult medical examination without abnormal findings: Secondary | ICD-10-CM

## 2018-09-17 DIAGNOSIS — R7303 Prediabetes: Secondary | ICD-10-CM

## 2018-09-17 DIAGNOSIS — Z1212 Encounter for screening for malignant neoplasm of rectum: Secondary | ICD-10-CM

## 2018-09-17 DIAGNOSIS — Z1211 Encounter for screening for malignant neoplasm of colon: Secondary | ICD-10-CM

## 2018-09-17 DIAGNOSIS — R7309 Other abnormal glucose: Secondary | ICD-10-CM

## 2018-09-17 DIAGNOSIS — E782 Mixed hyperlipidemia: Secondary | ICD-10-CM

## 2018-09-17 DIAGNOSIS — E890 Postprocedural hypothyroidism: Secondary | ICD-10-CM

## 2018-09-17 DIAGNOSIS — E559 Vitamin D deficiency, unspecified: Secondary | ICD-10-CM

## 2018-09-17 DIAGNOSIS — I10 Essential (primary) hypertension: Secondary | ICD-10-CM

## 2018-09-17 DIAGNOSIS — Z136 Encounter for screening for cardiovascular disorders: Secondary | ICD-10-CM | POA: Diagnosis not present

## 2018-09-17 DIAGNOSIS — R0989 Other specified symptoms and signs involving the circulatory and respiratory systems: Secondary | ICD-10-CM

## 2018-09-17 DIAGNOSIS — Z0001 Encounter for general adult medical examination with abnormal findings: Secondary | ICD-10-CM

## 2018-09-17 DIAGNOSIS — Z8249 Family history of ischemic heart disease and other diseases of the circulatory system: Secondary | ICD-10-CM

## 2018-09-17 DIAGNOSIS — Z79899 Other long term (current) drug therapy: Secondary | ICD-10-CM

## 2018-09-17 NOTE — Patient Instructions (Signed)

## 2018-09-17 NOTE — Progress Notes (Signed)
[facility] [name], M.D.     Uvaldo Bristle. [name], P.A.-C Liane Comber, [place] [address] [place] [address] Telephone [phone] Telefax [phone] Annual Screening/Preventative Visit & Comprehensive Evaluation &  Examination     This very nice 72 y.o. DBF presents for a Screening /Preventative Visit & comprehensive evaluation and management of multiple medical co-morbidities.  Patient has been followed for HTN, HLD, Prediabetes  and Vitamin D Deficiency. 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       Patient has been  followed with Labile HTN predates circa 2012. Patient's BP has been controlled at home and patient denies any cardiac symptoms as chest pain, palpitations, shortness of breath, dizziness or ankle swelling. Today's BP is at goal - 122/68.      Patient's hyperlipidemia is not controlled with diet and she refuses medications for lipids. Patient denies myalgias or other medication SE's. Last lipids were not at goal: Lab Results  Component Value Date   CHOL 264 (H) 06/15/2018   HDL 112 06/15/2018   LDLCALC 136 (H) 06/15/2018   TRIG 70 06/15/2018   CHOLHDL 2.4 06/15/2018      Patient has hx/o Toxic MNG and was treated by Dr Cruzita Lederer in Sept 2015 with RAI-131* and she alleges thyroid med compliance.      Patient has hx/o prediabetes (A1c 5.7% / 2012) and patient denies reactive hypoglycemic symptoms, visual blurring, diabetic polys or paresthesias. Last A1c was at goal: Lab Results  Component Value Date   HGBA1C 5.3 06/15/2018      Finally, patient has history of Vitamin D Deficiency ("10" / 2010,  "8" / 2018) and last Vitamin D was still very low despite recommendations to supplement Vit D: Lab Results  Component Value Date   VD25OH 12 (L) 02/05/2018   Current Outpatient Medications on File Prior to Visit  Medication Sig  . Acetaminophen 325 MG CAPS  Tylenol 325 mg capsule    . FIORICET50-325-40 MG tablet Take 1 tablet 4 x /day ONLY if needed for severe Headache  . ZYRTEC-D 5-120 MG tablet Take daily  . levothyroxine  50 MCG tablet Alternating 21mcg and 76mcg every other day.  . meclizine25 MG tablet Take 1 tablet 3 x d if needed for Dizziness / Vertigo  . naproxen  220 MG tablet Take 440 mg by mouth 2 times daily as needed   . rosuvastatin  40 MG tablet Patient not taking: Reported on 09/17/2018   Allergies  Allergen Reactions  . Penicillins Hives  . Pravastatin     Myalgia  . Sulfa Antibiotics Hives  . Zetia [Ezetimibe]     Myalgia   Past Medical History:  Diagnosis Date  . Blind right eye    legally  . 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 High cholesterol   . Hypertension   . Hypothyroidism   . Migraine   . Prediabetes   . Sarcoidosis, lung ([place]) 15 YRS AGO   NO TREATMENTS  . 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 Vertigo   . Vitamin D deficiency    Health Maintenance  Topic Date Due  . Hepatitis C Screening  [birth year]  . DEXA SCAN  01/07/2012  . MAMMOGRAM  07/14/2019  . Fecal DNA (Cologuard)  02/18/2020  . TETANUS/TDAP  10/15/2021  . INFLUENZA VACCINE  Completed  . PNA vac Low Risk Adult  Completed   Immunization History  Administered Date(s) Administered  . Influenza  Split 06/30/2013  . Influenza, High Dose Seasonal PF 06/26/2015, 07/25/2016, 05/26/2017, 06/30/2018  . Pneumococcal Conjugate-13 09/14/2014  . Pneumococcal Polysaccharide-23 10/16/2011, 02/04/2017  . Td 10/16/2011  . Zoster 01/14/2007   - Has repeatedly refused Colonoscopies - Cologard 02/17/2017 and f/u due 3 years in May 2021.  - Last MGM - 07/13/2018 Past Surgical History:  Procedure Laterality Date  . BACK SURGERY  1970  . CATARACT EXTRACTION Left   . COLONOSCOPY  2006; 03/28/11   2 small adenomas; normal  . EYE SURGERY  1964   right eye  . KNEE ARTHROSCOPY  2002  . NASAL SEPTUM SURGERY  1998  . TOTAL ABDOMINAL  HYSTERECTOMY  2000   Family History  Problem Relation Age of Onset  . Lung cancer Sister 62  . Throat cancer Brother 51  . Stroke Mother   . Emphysema Father   . COPD Sister   . Diabetes Brother   . Colon cancer Neg Hx    Social History   Tobacco Use  . Smoking status: Never Smoker  . Smokeless tobacco: Never Used  Substance Use Topics  . Alcohol use: Never    Alcohol/week: 1.0 standard drinks    Types: 1 Standard drinks or equivalent per week    Frequency: Never    Comment: RARE  . Drug use: Never    ROS Constitutional: Denies fever, chills, weight loss/gain, headaches, insomnia,  night sweats, and change in appetite. Does c/o fatigue. Eyes: Denies redness, blurred vision, diplopia, discharge, itchy, watery eyes.  ENT: Denies discharge, congestion, post nasal drip, epistaxis, sore throat, earache, hearing loss, dental pain, Tinnitus, Vertigo, Sinus pain, snoring.  Cardio: Denies chest pain, palpitations, irregular heartbeat, syncope, dyspnea, diaphoresis, orthopnea, PND, claudication, edema Respiratory: denies cough, dyspnea, DOE, pleurisy, hoarseness, laryngitis, wheezing.  Gastrointestinal: Denies dysphagia, heartburn, reflux, water brash, pain, cramps, nausea, vomiting, bloating, diarrhea, constipation, hematemesis, melena, hematochezia, jaundice, hemorrhoids Genitourinary: Denies dysuria, frequency, urgency, nocturia, hesitancy, discharge, hematuria, flank pain Breast: Breast lumps, nipple discharge, bleeding.  Musculoskeletal: Denies arthralgia, myalgia, stiffness, Jt. Swelling, pain, limp, and strain/sprain. Denies falls. Skin: Denies puritis, rash, hives, warts, acne, eczema, changing in skin lesion Neuro: No weakness, tremor, incoordination, spasms, paresthesia, pain Psychiatric: Denies confusion, memory loss, sensory loss. Denies Depression. Endocrine: Denies change in weight, skin, hair change, nocturia, and paresthesia, diabetic polys, visual blurring, hyper / hypo  glycemic episodes.  Heme/Lymph: No excessive bleeding, bruising, enlarged lymph nodes.  Physical Exam  BP 122/68   Pulse 88   Temp (!) 97 F (36.1 C)   Resp 16   Ht 5\' 6"  (1.676 m)   Wt 168 lb 3.2 oz (76.3 kg)   BMI 27.15 kg/m   General Appearance: Mildly overweight, well groomed and in no apparent distress.  Eyes: PERRLA, EOMs, conjunctiva no swelling or erythema, normal fundi and vessels. Sinuses: No frontal/maxillary tenderness ENT/Mouth: EACs patent / TMs  nl. Nares clear without erythema, swelling, mucoid exudates. Oral hygiene is good. No erythema, swelling, or exudate. Tongue normal, non-obstructing. Tonsils not swollen or erythematous. Hearing normal.  Neck: Supple, thyroid not palpable. No bruits, nodes or JVD. Respiratory: Respiratory effort normal.  BS equal and clear bilateral without rales, rhonci, wheezing or stridor. Cardio: Heart sounds are normal with regular rate and rhythm and no murmurs, rubs or gallops. Peripheral pulses are normal and equal bilaterally without edema. No aortic or femoral bruits. Chest: symmetric with normal excursions and percussion. Breasts:deferred to recent MGM Abdomen: Flat, soft with bowel sounds active. Nontender, no guarding,  rebound, hernias, masses, or organomegaly.  Lymphatics: Non tender without lymphadenopathy.  Musculoskeletal: Full ROM all peripheral extremities, joint stability, 5/5 strength, and normal gait. Skin: Warm and dry without rashes, lesions, cyanosis, clubbing or  ecchymosis.  Neuro: Cranial nerves intact, reflexes equal bilaterally. Normal muscle tone, no cerebellar symptoms. Sensation intact.  Pysch: Alert and oriented x 3: normal affect; insight and Judgment questionable.   Assessment and Plan  1. Annual Preventative Screening Examination  2. Labile hypertension  - EKG 12-Lead - Urinalysis, Routine w reflex microscopic - Microalbumin / creatinine urine ratio - CBC with Differential/Platelet - COMPLETE  METABOLIC PANEL WITH GFR - Magnesium - TSH  3. Hyperlipidemia, mixed  - EKG 12-Lead - Lipid panel - TSH  4. Abnormal glucose  - EKG 12-Lead - Hemoglobin A1c - Insulin, random  5. Vitamin D deficiency  - VITAMIN D 25 Hydroxyl  6. Prediabetes  - EKG 12-Lead - Hemoglobin A1c - Insulin, random  7. Postablative hypothyroidism  - TSH  8. Screening for colorectal cancer  - POC Hemoccult Bld/Stl  9. Screening for ischemic heart disease  - EKG 12-Lead  10. FH: hypertension   11. Medication management  - Urinalysis, Routine w reflex microscopic - Microalbumin / creatinine urine ratio - CBC with Differential/Platelet - COMPLETE METABOLIC PANEL WITH GFR - Magnesium - Lipid panel - TSH - Hemoglobin A1c - Insulin, random - VITAMIN D 25 Hydroxyl        Patient was counseled in prudent diet to achieve/maintain BMI less than 25 for weight control, BP monitoring, regular exercise and medications. Discussed med's effects and SE's. Screening labs and tests as requested with regular follow-up as recommended. Over 40 minutes of exam, counseling, chart review and high complex critical decision making was performed.

## 2018-09-18 LAB — CBC WITH DIFFERENTIAL/PLATELET
ABSOLUTE MONOCYTES: 542 {cells}/uL (ref 200–950)
BASOS PCT: 1.1 %
Basophils Absolute: 63 cells/uL (ref 0–200)
Eosinophils Absolute: 80 cells/uL (ref 15–500)
Eosinophils Relative: 1.4 %
HCT: 37.1 % (ref 35.0–45.0)
Hemoglobin: 12.1 g/dL (ref 11.7–15.5)
LYMPHS ABS: 2035 {cells}/uL (ref 850–3900)
MCH: 27.4 pg (ref 27.0–33.0)
MCHC: 32.6 g/dL (ref 32.0–36.0)
MCV: 83.9 fL (ref 80.0–100.0)
MPV: 9.5 fL (ref 7.5–12.5)
Monocytes Relative: 9.5 %
Neutro Abs: 2981 cells/uL (ref 1500–7800)
Neutrophils Relative %: 52.3 %
PLATELETS: 418 10*3/uL — AB (ref 140–400)
RBC: 4.42 10*6/uL (ref 3.80–5.10)
RDW: 13 % (ref 11.0–15.0)
TOTAL LYMPHOCYTE: 35.7 %
WBC: 5.7 10*3/uL (ref 3.8–10.8)

## 2018-09-18 LAB — LIPID PANEL
CHOLESTEROL: 296 mg/dL — AB (ref <200)
HDL: 109 mg/dL (ref >50)
LDL Cholesterol (Calc): 167 mg/dL (calc) — ABNORMAL HIGH
Non-HDL Cholesterol (Calc): 187 mg/dL (calc) — ABNORMAL HIGH (ref <130)
TRIGLYCERIDES: 93 mg/dL (ref <150)
Total CHOL/HDL Ratio: 2.7 (calc) (ref <5.0)

## 2018-09-18 LAB — COMPLETE METABOLIC PANEL WITH GFR
AG Ratio: 1.8 (calc) (ref 1.0–2.5)
ALT: 10 U/L (ref 6–29)
AST: 18 U/L (ref 10–35)
Albumin: 4.4 g/dL (ref 3.6–5.1)
Alkaline phosphatase (APISO): 104 U/L (ref 33–130)
BILIRUBIN TOTAL: 0.3 mg/dL (ref 0.2–1.2)
BUN: 12 mg/dL (ref 7–25)
CALCIUM: 9.7 mg/dL (ref 8.6–10.4)
CHLORIDE: 106 mmol/L (ref 98–110)
CO2: 30 mmol/L (ref 20–32)
Creat: 0.79 mg/dL (ref 0.60–0.93)
GFR, Est African American: 87 mL/min/{1.73_m2} (ref >=60)
GFR, Est Non African American: 75 mL/min/{1.73_m2} (ref >=60)
GLUCOSE: 99 mg/dL (ref 65–99)
Globulin: 2.4 g/dL (calc) (ref 1.9–3.7)
Potassium: 4.3 mmol/L (ref 3.5–5.3)
Sodium: 142 mmol/L (ref 135–146)
Total Protein: 6.8 g/dL (ref 6.1–8.1)

## 2018-09-18 LAB — HEMOGLOBIN A1C
Hgb A1c MFr Bld: 5.3 % of total Hgb (ref <5.7)
Mean Plasma Glucose: 105 (calc)
eAG (mmol/L): 5.8 (calc)

## 2018-09-18 LAB — INSULIN, RANDOM: Insulin: 14.3 u[IU]/mL (ref 2.0–19.6)

## 2018-09-18 LAB — MICROALBUMIN / CREATININE URINE RATIO
Creatinine, Urine: 112 mg/dL (ref 20–275)
MICROALB UR: 0.7 mg/dL
MICROALB/CREAT RATIO: 6 ug/mg{creat} (ref <30)

## 2018-09-18 LAB — URINALYSIS, ROUTINE W REFLEX MICROSCOPIC
BACTERIA UA: NONE SEEN /HPF
Bilirubin Urine: NEGATIVE
Glucose, UA: NEGATIVE
HYALINE CAST: NONE SEEN /LPF
Ketones, ur: NEGATIVE
Nitrite: NEGATIVE
Protein, ur: NEGATIVE
Specific Gravity, Urine: 1.016 (ref 1.001–1.03)
Squamous Epithelial / LPF: NONE SEEN /HPF (ref <=5)
WBC UA: NONE SEEN /HPF (ref 0–5)

## 2018-09-18 LAB — TSH: TSH: 4.71 mIU/L — ABNORMAL HIGH (ref 0.40–4.50)

## 2018-09-18 LAB — MAGNESIUM: Magnesium: 2.4 mg/dL (ref 1.5–2.5)

## 2018-09-18 LAB — VITAMIN D 25 HYDROXY (VIT D DEFICIENCY, FRACTURES): Vit D, 25-Hydroxy: 6 ng/mL — ABNORMAL LOW (ref 30–100)

## 2018-09-19 ENCOUNTER — Other Ambulatory Visit: Payer: Self-pay | Admitting: Internal Medicine

## 2018-09-19 DIAGNOSIS — E782 Mixed hyperlipidemia: Secondary | ICD-10-CM

## 2018-09-20 ENCOUNTER — Encounter: Payer: Self-pay | Admitting: Internal Medicine

## 2018-10-13 ENCOUNTER — Other Ambulatory Visit: Payer: Self-pay

## 2018-10-13 DIAGNOSIS — Z1211 Encounter for screening for malignant neoplasm of colon: Secondary | ICD-10-CM

## 2018-10-13 DIAGNOSIS — Z1212 Encounter for screening for malignant neoplasm of rectum: Principal | ICD-10-CM

## 2018-10-13 LAB — POC HEMOCCULT BLD/STL (HOME/3-CARD/SCREEN)
Card #2 Fecal Occult Blod, POC: NEGATIVE
FECAL OCCULT BLD: NEGATIVE
Fecal Occult Blood, POC: NEGATIVE

## 2018-10-14 DIAGNOSIS — Z1211 Encounter for screening for malignant neoplasm of colon: Secondary | ICD-10-CM | POA: Diagnosis not present

## 2018-10-29 ENCOUNTER — Ambulatory Visit: Payer: Medicare Other | Admitting: Internal Medicine

## 2018-10-29 ENCOUNTER — Encounter: Payer: Self-pay | Admitting: Internal Medicine

## 2018-10-29 VITALS — BP 120/70 | HR 60 | Ht 66.0 in | Wt 167.0 lb

## 2018-10-29 DIAGNOSIS — Z87898 Personal history of other specified conditions: Secondary | ICD-10-CM | POA: Diagnosis not present

## 2018-10-29 DIAGNOSIS — E042 Nontoxic multinodular goiter: Secondary | ICD-10-CM | POA: Diagnosis not present

## 2018-10-29 DIAGNOSIS — E890 Postprocedural hypothyroidism: Secondary | ICD-10-CM | POA: Diagnosis not present

## 2018-10-29 LAB — T4, FREE: Free T4: 0.95 ng/dL (ref 0.60–1.60)

## 2018-10-29 LAB — TSH: TSH: 2.79 u[IU]/mL (ref 0.35–4.50)

## 2018-10-29 MED ORDER — LEVOTHYROXINE SODIUM 50 MCG PO TABS: ORAL_TABLET | ORAL | 3 refills | Status: DC

## 2018-10-29 NOTE — Patient Instructions (Signed)
Please stop at the lab.  Continue Levothyroxine 75 alternating with 50 mcg every other day.  Take the thyroid hormone every day, with water, at least 30 minutes before breakfast, separated by at least 4 hours from: - acid reflux medications - calcium - iron - multivitamins  Please come back for a follow-up appointment in 6 months.

## 2018-10-29 NOTE — Progress Notes (Signed)
Patient ID: Laura Maynard, female   DOB: 1946-05-10, 73 y.o.   MRN: [id number]   HPI  Laura Maynard is a 73 y.o.-year-old female, returning for f/u for postablative hypothyroidism after toxic multinodular goiter (TMNG) RAI tx. Last visit 1 year and 2 months ago.  Reviewed history: Pt had screening labs in 01/2014 and was found to have a low TSH. This was repeated 2x and the TSH continued to decrease.   Thyroid Uptake and scan (05/20/2014) >> uptake 48.5% and scan c/w TMNG.  We started MMI 5 mg bid.  Had RAI tx on 06/09/2014.   After RAI treatment, she developed post-ablative hypothyroidism >> we started levothyroxine 50 g daily. She developed leg cramps, which resolved after stopping levothyroxine but her TSH started to increase, so we change to Tirosint. Unfortunately, she could not afford this and we had to restart levothyroxine at the lower dose, subsequent to be increased back to 50 g daily (on 08/12/2016).  Her TSH became suppressed after increased the dose to 75 mcg daily.  She had itching with a 75 mcg tablet so we switched to only 50 mcg tablets.  Pt is now on levothyroxine 50 mcg alternating with 75 mcg every other day, taken: - in am - with water - fasting - at least 1h from b'fast - no Ca, Fe, MVI, PPIs - not on Biotin  Review TFTs: Lab Results  Component Value Date   TSH 4.71 (H) 09/17/2018   TSH 1.93 06/15/2018   TSH 2.69 02/05/2018   TSH 0.75 10/14/2017   TSH 0.29 (L) 08/28/2017   TSH 0.16 (L) 06/23/2017   TSH 5.51 (H) 04/28/2017   TSH 3.96 02/04/2017   TSH 3.77 11/05/2016   TSH 1.31 10/02/2016   FREET4 0.95 10/14/2017   FREET4 1.14 08/28/2017   FREET4 1.11 06/23/2017   FREET4 0.91 04/28/2017   FREET4 0.87 10/02/2016   FREET4 0.65 08/12/2016   FREET4 0.44 (L) 06/26/2016   FREET4 0.79 02/20/2016   FREET4 0.83 11/09/2015   FREET4 0.77 08/31/2015    Pt denies: - feeling nodules in neck - hoarseness - dysphagia - choking - SOB with lying down  She  also has a history of HTN, HL, anemia.  Vitamin D deficiency -severe: Lab Results  Component Value Date   VD25OH 6 (L) 09/17/2018   VD25OH 12 (L) 02/05/2018   VD25OH 16.80 (L) 06/23/2017   VD25OH 23.88 (L) 04/28/2017   VD25OH 20 (L) 02/04/2017   VD25OH 8 (L) 11/05/2016   VD25OH 12 (L) 01/16/2016   VD25OH 13 (L) 07/27/2015   VD25OH 15 (L) 04/19/2015   VD25OH 20 (L) 12/27/2014   She was on 5000 units vitamin D daily but not compliant due to headaches, itching.  She started Vitamin D 1000 units daily now.  ROS: Constitutional: + weight gain/no weight loss, + fatigue, no subjective hyperthermia, no subjective hypothermia, + nocturia Eyes: no blurry vision, no xerophthalmia ENT: no sore throat, + see HPI Cardiovascular: no CP/no SOB/no palpitations/no leg swelling Respiratory: no cough/no SOB/no wheezing Gastrointestinal: no N/no V/no D/no C/no acid reflux Musculoskeletal: + muscle aches/+ joint aches Skin: no rashes, + hair loss Neurological: no tremors/no numbness/no tingling/no dizziness  Past Medical History:  Diagnosis Date  . Blind right eye    legally  . 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 High cholesterol   . Hypertension   . Hypothyroidism   . Migraine   . Prediabetes   . Sarcoidosis, lung ([place]) 15 YRS AGO  NO TREATMENTS  . 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 Vertigo   . Vitamin D deficiency    Past Surgical History:  Procedure Laterality Date  . BACK SURGERY  1970  . CATARACT EXTRACTION Left   . COLONOSCOPY  2006; 03/28/11   2 small adenomas; normal  . EYE SURGERY  1964   right eye  . KNEE ARTHROSCOPY  2002  . NASAL SEPTUM SURGERY  1998  . TOTAL ABDOMINAL HYSTERECTOMY  2000   Social History   Socioeconomic History  . Marital status: Divorced    Spouse name: Not on file  . Number of children: 0  . Years of education: college  . Highest education level: Not on file  Occupational History    Comment: Retired  Scientific laboratory technician  . Financial  resource strain: Not on file  . Food insecurity:    Worry: Not on file    Inability: Not on file  . Transportation needs:    Medical: Not on file    Non-medical: Not on file  Tobacco Use  . Smoking status: Never Smoker  . Smokeless tobacco: Never Used  Substance and Sexual Activity  . Alcohol use: Never    Alcohol/week: 1.0 standard drinks    Types: 1 Standard drinks or equivalent per week    Frequency: Never    Comment: RARE  . Drug use: Never  . Sexual activity: Not on file  Lifestyle  . Physical activity:    Days per week: Not on file    Minutes per session: Not on file  . Stress: Not on file  Relationships  . Social connections:    Talks on phone: Not on file    Gets together: Not on file    Attends religious service: Not on file    Active member of club or organization: Not on file    Attends meetings of clubs or organizations: Not on file    Relationship status: Not on file  . Intimate partner violence:    Fear of current or ex partner: Not on file    Emotionally abused: Not on file    Physically abused: Not on file    Forced sexual activity: Not on file  Other Topics Concern  . Not on file  Social History Narrative   Patient is retired and lives at home alone. Patient  has college education.   Caffeine- 3 cups of caffeine daily.  One soda daily.   Right handed.   Current Outpatient Medications on File Prior to Visit  Medication Sig Dispense Refill  . Acetaminophen (TYLENOL) 325 MG CAPS Tylenol 325 mg capsule  Take by oral route.    . butalbital-acetaminophen-caffeine (FIORICET, ESGIC) 50-325-40 MG tablet TAKE 1 TABLET BY MOUTH FOUR TIMES DAILY ONLY IF NEEDED FOR SEVERE HEADACHE 20 tablet 0  . cetirizine-pseudoephedrine (ZYRTEC-D) 5-120 MG tablet Take by mouth.    . meclizine (ANTIVERT) 25 MG tablet Take 1 tablet 3 x d if needed for Dizziness / Vertigo 100 tablet 1  . naproxen sodium (ANAPROX) 220 MG tablet Take 440 mg by mouth 2 (two) times daily as needed  (pain).    . rosuvastatin (CRESTOR) 40 MG tablet Take 1/2 to 1 tablet daily or as directed for Cholesterol (Patient not taking: Reported on 10/29/2018) 30 tablet 5   No current facility-administered medications on file prior to visit.    Allergies  Allergen Reactions  . Penicillins Hives  . Pravastatin     Myalgia  . Sulfa Antibiotics Hives  .  Zetia [Ezetimibe]     Myalgia   Family History  Problem Relation Age of Onset  . Lung cancer Sister 56  . Throat cancer Brother 18  . Stroke Mother   . Emphysema Father   . COPD Sister   . Diabetes Brother   . Colon cancer Neg Hx      PE: BP 120/70   Pulse 60   Ht 5\' 6"  (1.676 m) Comment: measured  Wt 167 lb (75.8 kg)   SpO2 94%   BMI 26.95 kg/m  Body mass index is 26.95 kg/m. Wt Readings from Last 3 Encounters:  10/29/18 167 lb (75.8 kg)  09/17/18 168 lb 3.2 oz (76.3 kg)  06/15/18 166 lb (75.3 kg)   Constitutional: normal weight, in NAD Eyes: PERRLA, EOMI, no exophthalmos ENT: moist mucous membranes, no thyromegaly, no cervical lymphadenopathy Cardiovascular: RRR, No MRG Respiratory: CTA B Gastrointestinal: abdomen soft, NT, ND, BS+ Musculoskeletal: no deformities, strength intact in all 4 Skin: moist, warm, no rashes Neurological: no tremor with outstretched hands, DTR normal in all 4  ASSESSMENT: 1. Hypothyroidism after RAI tx   2. MNG  3. vitamin D deficiency  PLAN:  1. And 2.  Patient with history of thyrotoxicosis due to toxic multinodular goiter, resolved after her RAI treatment.  She did develop post ablative hypothyroidism for which we started levothyroxine.  She initially had severe leg cramps with this but she was also found to have a very low vitamin D and was started on supplementation.  We tried to use Tirosint but this was too expensive for her.  She developed itching on the 75 mcg levothyroxine tablet so we switched to 1.5 tablets of the 50 mcg daily which does not have preservatives or other excipients.   Her itching resolved on this. -In the past, she wanted to switch to only seeing her PCP for hypothyroidism, but she now returns after more than a year absence after a TSH returned higher than normal, at 4.7, on 09/17/2018. - she continues on LT4 50 alternating with 75 Mcg daily - pt feels good on this dose. - we discussed about taking the thyroid hormone every day, with water, >30 minutes before breakfast, separated by >4 hours from acid reflux medications, calcium, iron, multivitamins. Pt. is taking it correctly. - will check thyroid tests today: TSH and fT4 - If labs are abnormal, she will need to return for repeat TFTs in 1.5 months  3.  vitamin D deficiency  -She continues to have extremely low vitamin D levels.  Latest level was from 08/2018 and this was 6! -At last visit I advised her to restart 5000 units vitamin D daily.  She was telling me that she had headaches with this before.  I advised her to get the gluten-free formulation.  Needs refills.  Office Visit on 10/29/2018  Component Date Value Ref Range Status  . TSH 10/29/2018 2.79  0.35 - 4.50 uIU/mL Final  . Free T4 10/29/2018 0.95  0.60 - 1.60 ng/dL Final   Comment: Specimens from patients who are undergoing biotin therapy and /or ingesting biotin supplements may contain high levels of biotin.  The higher biotin concentration in these specimens interferes with this Free T4 assay.  Specimens that contain high levels  of biotin may cause false high results for this Free T4 assay.  Please interpret results in light of the total clinical presentation of the patient.     Labs are normal therefore, I would advise the patient to continue the  current dose of levothyroxine.  Philemon Kingdom, MD PhD [facility] End[facility]

## 2018-10-30 ENCOUNTER — Telehealth: Payer: Self-pay

## 2018-10-30 NOTE — Telephone Encounter (Signed)
-----   Message from Philemon Kingdom, MD sent at 10/29/2018  5:33 PM EST ----- [name], can you please call pt: Labs are normal now, therefore, she can continue the current dose of levothyroxine.  I refilled her prescription.  We will need to check labs in 6 months, as discussed.  Labs are in.

## 2018-10-30 NOTE — Telephone Encounter (Signed)
Called patient, phone rang several times them I got a recording asking me to input a remote access code.  Will try again later, if same issue then I will mail the information.

## 2018-11-06 NOTE — Telephone Encounter (Signed)
Tried calling again, and not able to get through.

## 2018-11-10 NOTE — Telephone Encounter (Signed)
Still unable to contact by phone, letter sent.

## 2018-12-17 ENCOUNTER — Telehealth: Payer: Self-pay | Admitting: *Deleted

## 2018-12-17 NOTE — Telephone Encounter (Signed)
   Primary Cardiologist:  No primary care provider on file.  DR HILTY - [facility] Patient contacted.  History reviewed.  No symptoms to suggest any unstable cardiac conditions.  Based on discussion, with current pandemic situation, we will be postponing this appointment for Ms Laura Maynard.  If symptoms change, she has been instructed to contact our office.  Routing to C19 CANCEL pool for tracking (P CV DIV CV19 CANCEL).  Raiford Simmonds, RN  12/17/2018 10:41 AM      Patient request  To be the on of the first to be schedule " I HAD TO WAIT FOR 2 MONTHS  FOR THIS APPOINTMENT"   .

## 2018-12-22 ENCOUNTER — Ambulatory Visit: Payer: Medicare Other | Admitting: Internal Medicine

## 2018-12-28 NOTE — Progress Notes (Signed)
Virtual Visit via Telephone Note  I connected with Laura Maynard on 12/29/18 at  8:45 AM EDT by telephone and verified that I am speaking with the correct person using two identifiers.   I discussed the limitations, risks, security and privacy concerns of performing an evaluation and management service by telephone and the availability of in person appointments. I also discussed with the patient that there may be a patient responsible charge related to this service. The patient expressed understanding and agreed to proceed.   Follow Up Instructions:    I discussed the assessment and treatment plan with the patient. The patient was provided an opportunity to ask questions and all were answered. The patient agreed with the plan and demonstrated an understanding of the instructions.   The patient was advised to call back or seek an in-person evaluation if the symptoms worsen or if the condition fails to improve as anticipated.  I provided 40 minutes of non-face-to-face time during this encounter.   Izora Ribas, NP      MEDICARE ANNUAL WELLNESS VISIT AND FOLLOW UP  Assessment:    Encounter for Medicare annual wellness exam 73 year  Essential hypertension - continue medications, DASH diet, exercise and monitor at home. Call if greater than 130/80.  -     COMPLETE METABOLIC PANEL WITH GFR -     CBC with Differential/Platelet -     TSH -     Urinalysis, Routine w reflex microscopic  History of prediabetes Monitor  Postablative hypothyroidism  follows with Dr. Darnell Level, continue med the same at this time -     TSH  Hyperlipidemia -continue medications, check lipids, decrease fatty foods, increase activity.  -Newly followed by lipid clinic for consideration of PCSK9  TMJ (temporomandibular joint syndrome) Monitor  Benign paroxysmal positional vertigo due to bilateral vestibular disorder Monitor  Multinodular goiter  follows with Dr. Darnell Level, continue med the same at this  time  Vitamin D deficiency -     VITAMIN D 25 Hydroxy (Vit-D Deficiency, Fractures)  Poor compliance with medication No emphasized compliance with medications and appointments, patient expressed understanding  Other obsessive-compulsive disorders Patient reports improved symptoms  Monitor with routine screening  Medication management -     Magnesium  BMI 26.0-26.9,adult - long discussion about weight loss, diet, and exercise -recommended diet heavy in fruits and veggies and low in animal meats, cheeses, and dairy products  Allergic rhinosinusitis Sent in zyrtec D BID, alternately discussed getting certirizine and pseudoephedrine separately, recommended saline irrigations, flonase increase H20, allergy hygiene explained. Encouraged to call back if sx not improving, or with fever/chills  ALL LABS DEFERRED IN LIGHT OF TELEVISIT FOR COVID 19  Over 30 minutes of exam, counseling, chart review, and critical decision making was performed  Future Appointments  Date Time Provider [place]  03/31/2019  9:30 AM [name], MD [facility] None  04/22/2019  9:00 AM Philemon Kingdom, MD [facility] None  10/14/2019  2:00 PM [name], MD [facility] None    Plan:   During the course of the visit the patient was educated and counseled about appropriate screening and preventive services including:    Pneumococcal vaccine   Influenza vaccine  Td vaccine  Prevnar 13  Screening electrocardiogram  Screening mammography  Bone densitometry screening  Colorectal cancer screening  Diabetes screening  Glaucoma screening  Nutrition counseling   Advanced directives: given info/requested copies   Subjective:   Laura Maynard is a 73 y.o. female who presents for  Medicare Annual Wellness Visit and 3 month follow up on hypertension, glucose management, hyperlipidemia, vitamin D def.  She reports new sinus pressure with pain radiating into upper jaw 3 days  intermittently. She also endorses sneezing, nasal congestion, itching, and new scratchy throat and dry cough. Denies vision changes, dizziness, ear pressure or pain. Denies any travel or sick contacts, she has been distancing at home for coronavirus. She is taking zyrtec D which is very helpful, but getting OTC and cannot afford. She reports this is typical for her annual allergies with sinusitis. She has not tried saline irrigations, topical steroid.   She is getting knee injections by Dr. Wynelle Link for bilateral knee arthritis and doing fairly.   BMI is Body mass index is 26.63 kg/m., she has not been working on diet and exercise. She admits to minimal fruit, 1 serving of vegetables daily. 72 bottles of 16 ounces.  Wt Readings from Last 3 Encounters:  12/29/18 165 lb (74.8 kg)  10/29/18 167 lb (75.8 kg)  09/17/18 168 lb 3.2 oz (76.3 kg)   Her blood pressure has been controlled at home, today their BP is   N/A no cuff at home  She does not workout. She denies chest pain, shortness of breath, dizziness.    She is not on cholesterol medication (adamandtly declines all medication for lipids, pending visit with lipid clinic) and denies myalgias. Her cholesterol is not at goal. The cholesterol last visit was:   Lab Results  Component Value Date   CHOL 296 (H) 09/17/2018   HDL 109 09/17/2018   LDLCALC 167 (H) 09/17/2018   TRIG 93 09/17/2018   CHOLHDL 2.7 09/17/2018     Lab Results  Component Value Date   HGBA1C 5.3 09/17/2018  Patient is newly on Vitamin D supplement  Lab Results  Component Value Date   VD25OH 6 (L) 09/17/2018    hx/o Toxic MNG and was treated by Dr Cruzita Lederer in Sept 2015 with RAI-131*  She is on thyroid medication, follows with Dr. Cruzita Lederer. Her medication was not changed last visit.   Lab Results  Component Value Date   TSH 2.79 10/29/2018     Lab Results  Component Value Date   GFRNONAA 75 09/17/2018      Medication Review  Current Outpatient Medications  (Endocrine & Metabolic):  .  levothyroxine (SYNTHROID, LEVOTHROID) 50 MCG tablet, TAKE 1 AND ONE-HALF TABLETS BY MOUTH DAILY.  Pt states alternating 52mcg and 75mcg every other day.  Current Outpatient Medications (Cardiovascular):  .  rosuvastatin (CRESTOR) 40 MG tablet, Take 1/2 to 1 tablet daily or as directed for Cholesterol (Patient not taking: Reported on 10/29/2018)  Current Outpatient Medications (Respiratory):  .  cetirizine-pseudoephedrine (ZYRTEC-D) 5-120 MG tablet, Take by mouth.  Current Outpatient Medications (Analgesics):  Marland Kitchen  Acetaminophen (TYLENOL) 325 MG CAPS, Tylenol 325 mg capsule  Take by oral route. .  butalbital-acetaminophen-caffeine (FIORICET, ESGIC) 50-325-40 MG tablet, TAKE 1 TABLET BY MOUTH FOUR TIMES DAILY ONLY IF NEEDED FOR SEVERE HEADACHE .  naproxen sodium (ANAPROX) 220 MG tablet, Take 440 mg by mouth 2 (two) times daily as needed (pain).   Current Outpatient Medications (Other):  .  meclizine (ANTIVERT) 25 MG tablet, Take 1 tablet 3 x d if needed for Dizziness / Vertigo  Allergies: Allergies  Allergen Reactions  . Penicillins Hives  . Pravastatin     Myalgia  . Sulfa Antibiotics Hives  . Zetia [Ezetimibe]     Myalgia    Current Problems (verified) has Labile  hypertension; History of prediabetes; Vitamin D deficiency; Hyperlipidemia, mixed; Poor compliance with medication; Benign paroxysmal positional vertigo; TMJ (temporomandibular joint syndrome); Postablative hypothyroidism; BMI 26.0-26.9,adult; Medication Phobia; Multinodular goiter; FH: hypertension; and Abnormal glucose on their problem list.  Screening Tests Immunization History  Administered Date(s) Administered  . Influenza Split 06/30/2013  . Influenza, High Dose Seasonal PF 06/26/2015, 07/25/2016, 05/26/2017, 06/30/2018  . Pneumococcal Conjugate-13 09/14/2014  . Pneumococcal Polysaccharide-23 10/16/2011, 02/04/2017  . Td 10/16/2011  . Zoster 01/14/2007    Preventative care: Last  colonoscopy: 2012 cologuard 01/2017 Last mammogram: 06/2018  DEXA: declines  Prior vaccinations: TD or Tdap: 2013  Influenza: 2019  Pneumococcal:  2013 Prevnar13:  2015 Shingles/Zostavax: 2008  Names of Other Physician/Practitioners you currently use: 1. [facility]- here for primary care 2. Dr Anastasio Champion. , eye doctor, last visit   April 2019, had cataract 3. Dr. Clifton James, dentist, last visit 2019, q43m  Patient Care Team: [name], MD as PCP - General (Internal Medicine) Vanessa Kick, MD (Dermatology) Philemon Kingdom, MD as Consulting Physician (Internal Medicine) Marcial Pacas, MD as Consulting Physician (Neurology) Gatha Mayer, MD as Consulting Physician (Gastroenterology) Rozetta Nunnery, MD as Consulting Physician (Otolaryngology) Gaynelle Arabian, MD as Consulting Physician (Orthopedic Surgery) Dr. Ulanda Edison  Surgical: She  has a past surgical history that includes Eye surgery (1964); Back surgery (1970); Nasal septum surgery (1998); Total abdominal hysterectomy (2000); Knee arthroscopy (2002); Colonoscopy (2006; 03/28/11); and Cataract extraction (Left). Family Her family history includes COPD in her sister; Diabetes in her brother; Emphysema in her father; Lung cancer (age of onset: 24) in her sister; Stroke in her mother; Throat cancer (age of onset: 61) in her brother. Social history  She reports that she has never smoked. She has never used smokeless tobacco. She reports that she does not drink alcohol or use drugs.  MEDICARE WELLNESS OBJECTIVES: Physical activity: Current Exercise Habits: The patient does not participate in regular exercise at present, Exercise limited by: orthopedic condition(s) Cardiac risk factors: Cardiac Risk Factors include: advanced age (>7men, >68 women);dyslipidemia;hypertension;sedentary lifestyle Depression/mood screen:   Depression screen [facility] 2/9 12/29/2018  Decreased Interest 0  Down,  Depressed, Hopeless 0  PHQ - 2 Score 0    ADLs:  In your present state of health, do you have any difficulty performing the following activities: 12/29/2018 09/20/2018  Hearing? N N  Vision? N N  Difficulty concentrating or making decisions? N N  Walking or climbing stairs? N N  Dressing or bathing? N N  Doing errands, shopping? N N  Some recent data might be hidden     Cognitive Testing  Alert? Yes  Normal Appearance?Yes  Oriented to person? Yes  Place? Yes   Time? Yes  Recall of three objects?  Yes  Can perform simple calculations? Yes  Displays appropriate judgment?Yes  Can read the correct time from a watch face?Yes  EOL planning: Does Patient Have a Medical Advance Directive?: Yes Type of Advance Directive: Healthcare Power of Attorney, Living will Does patient want to make changes to medical advance directive?: No - Patient declined Copy of [place] in Chart?: No - copy requested   Objective:   Today's Vitals   12/29/18 0845  Temp: (!) 97.4 F (36.3 C)  Weight: 165 lb (74.8 kg)   Wt Readings from Last 3 Encounters:  12/29/18 165 lb (74.8 kg)  10/29/18 167 lb (75.8 kg)  09/17/18 168 lb 3.2 oz (76.3 kg)    Body mass index  is 26.63 kg/m.  General: no apparent distress EENT: normal hearing, no hoarseness or cough for duration of visit Respiratory: No apparent respiratory distress, no audible wheezing, speaks in complete sentences Neuro: A&O Psych: pleasant, insight and judgement appropriate    Medicare Attestation I have personally reviewed: The patient's medical and social history Their use of alcohol, tobacco or illicit drugs Their current medications and supplements The patient's functional ability including ADLs,fall risks, home safety risks, cognitive, and hearing and visual impairment Diet and physical activities Evidence for depression or mood disorders  The patient's weight, height, BMI, and visual acuity have been recorded in  the chart.  I have made referrals, counseling, and provided education to the patient based on review of the above and I have provided the patient with a written personalized care plan for preventive services.     Izora Ribas, NP   12/29/2018

## 2018-12-29 ENCOUNTER — Encounter: Payer: Self-pay | Admitting: Adult Health

## 2018-12-29 ENCOUNTER — Other Ambulatory Visit: Payer: Self-pay

## 2018-12-29 ENCOUNTER — Ambulatory Visit: Payer: Medicare Other | Admitting: Adult Health

## 2018-12-29 VITALS — Temp 97.4°F | Wt 165.0 lb

## 2018-12-29 DIAGNOSIS — E890 Postprocedural hypothyroidism: Secondary | ICD-10-CM | POA: Diagnosis not present

## 2018-12-29 DIAGNOSIS — Z6826 Body mass index (BMI) 26.0-26.9, adult: Secondary | ICD-10-CM

## 2018-12-29 DIAGNOSIS — E782 Mixed hyperlipidemia: Secondary | ICD-10-CM | POA: Diagnosis not present

## 2018-12-29 DIAGNOSIS — Z79899 Other long term (current) drug therapy: Secondary | ICD-10-CM

## 2018-12-29 DIAGNOSIS — Z9114 Patient's other noncompliance with medication regimen: Secondary | ICD-10-CM

## 2018-12-29 DIAGNOSIS — R6889 Other general symptoms and signs: Secondary | ICD-10-CM

## 2018-12-29 DIAGNOSIS — E559 Vitamin D deficiency, unspecified: Secondary | ICD-10-CM

## 2018-12-29 DIAGNOSIS — F428 Other obsessive-compulsive disorder: Secondary | ICD-10-CM

## 2018-12-29 DIAGNOSIS — J302 Other seasonal allergic rhinitis: Secondary | ICD-10-CM

## 2018-12-29 DIAGNOSIS — Z87898 Personal history of other specified conditions: Secondary | ICD-10-CM | POA: Diagnosis not present

## 2018-12-29 DIAGNOSIS — Z0001 Encounter for general adult medical examination with abnormal findings: Secondary | ICD-10-CM | POA: Diagnosis not present

## 2018-12-29 DIAGNOSIS — M26609 Unspecified temporomandibular joint disorder, unspecified side: Secondary | ICD-10-CM

## 2018-12-29 DIAGNOSIS — R7309 Other abnormal glucose: Secondary | ICD-10-CM

## 2018-12-29 DIAGNOSIS — H8113 Benign paroxysmal vertigo, bilateral: Secondary | ICD-10-CM

## 2018-12-29 DIAGNOSIS — Z0000 Encounter for general adult medical examination without abnormal findings: Secondary | ICD-10-CM

## 2018-12-29 DIAGNOSIS — R0989 Other specified symptoms and signs involving the circulatory and respiratory systems: Secondary | ICD-10-CM

## 2018-12-29 DIAGNOSIS — E042 Nontoxic multinodular goiter: Secondary | ICD-10-CM

## 2018-12-29 MED ORDER — CETIRIZINE-PSEUDOEPHEDRINE ER 5-120 MG PO TB12: 1.0000 | ORAL_TABLET | Freq: Two times a day (BID) | ORAL | 2 refills | Status: DC

## 2019-01-18 ENCOUNTER — Telehealth: Payer: Self-pay

## 2019-01-18 NOTE — Telephone Encounter (Signed)
Patient states that she has been taking zyrtec but it hasn't helped her sinuses. Now it feels like it has moved into her ear, hurts to touch her ear. Symptoms began on Saturday.  Please advise.

## 2019-01-19 NOTE — Telephone Encounter (Signed)
Left detailed message on voicemail.  

## 2019-02-16 ENCOUNTER — Other Ambulatory Visit: Payer: Self-pay | Admitting: Internal Medicine

## 2019-02-16 MED ORDER — BUTALBITAL-APAP-CAFFEINE 50-325-40 MG PO TABS: ORAL_TABLET | ORAL | 0 refills | Status: DC

## 2019-03-03 ENCOUNTER — Ambulatory Visit (INDEPENDENT_AMBULATORY_CARE_PROVIDER_SITE_OTHER): Payer: Medicare Other | Admitting: Adult Health

## 2019-03-03 ENCOUNTER — Other Ambulatory Visit: Payer: Self-pay

## 2019-03-03 ENCOUNTER — Encounter: Payer: Self-pay | Admitting: Adult Health

## 2019-03-03 VITALS — BP 104/64 | HR 92 | Temp 97.9°F | Ht 66.0 in | Wt 168.0 lb

## 2019-03-03 DIAGNOSIS — Z9109 Other allergy status, other than to drugs and biological substances: Secondary | ICD-10-CM | POA: Diagnosis not present

## 2019-03-03 DIAGNOSIS — H60503 Unspecified acute noninfective otitis externa, bilateral: Secondary | ICD-10-CM | POA: Diagnosis not present

## 2019-03-03 DIAGNOSIS — K1120 Sialoadenitis, unspecified: Secondary | ICD-10-CM | POA: Diagnosis not present

## 2019-03-03 MED ORDER — HYDROCORTISONE-ACETIC ACID 1-2 % OT SOLN: 3.0000 [drp] | Freq: Two times a day (BID) | OTIC | 1 refills | Status: DC | PRN

## 2019-03-03 MED ORDER — DOXYCYCLINE HYCLATE 100 MG PO CAPS: ORAL_CAPSULE | ORAL | 0 refills | Status: DC

## 2019-03-03 MED ORDER — PREDNISONE 20 MG PO TABS: ORAL_TABLET | ORAL | 0 refills | Status: DC

## 2019-03-03 NOTE — Patient Instructions (Signed)
Call back if not any better     Parotitis  Parotitis is inflammation of one or both of your parotid glands. These glands produce saliva. They are found on each side of your face, below and in front of your earlobes. The saliva that they produce comes out of tiny openings (ducts) inside your cheeks. Parotitis may cause sudden swelling and pain (acute parotitis). It can also cause repeated episodes of swelling and pain or continued swelling that may or may not be painful (chronic parotitis). What are the causes? This condition may be caused by:  Infections from bacteria.  Infections from viruses, such as mumps or HIV.  Blockage (obstruction) of saliva flow through the parotid glands. This can be from a stone, scar tissue, or a tumor.  Diseases that cause your body's defense system (immune system) to attack healthy cells in your salivary glands. These are called autoimmune diseases. What increases the risk? You are more likely to develop this condition if:  You are 6 years old or older.  You do not drink enough fluids (are dehydrated).  You drink too much alcohol.  You have: ? A dry mouth. ? Poor dental hygiene. ? Diabetes. ? Gout. ? A long-term illness.  You have had radiation treatments to the head and neck.  You take certain medicines. What are the signs or symptoms? Symptoms of this condition depend on the cause. Symptoms may include:  Swelling under and in front of the ear. This may get worse after eating.  Redness of the skin over the parotid gland.  Pain and tenderness over the parotid gland. This may get worse after eating.  Fever or chills.  Pus coming from the ducts inside the mouth.  Dry mouth.  A bad taste in the mouth. How is this diagnosed? This condition may be diagnosed based on:  Your medical history.  A physical exam.  Tests to find the cause of the parotitis. These may include: ? Doing blood tests to check for an autoimmune disease or  infections from a virus. ? Taking a fluid sample from the parotid gland and testing it for infection. ? Injecting the ducts of the parotid gland with a dye and then taking X-rays (sialogram). ? Having other imaging tests of the gland, such as X-rays, ultrasound, MRI, or CT scan. ? Checking the opening of the gland for a stone or obstruction. ? Placing a needle into the gland to remove tissue for a biopsy (fine needle aspiration). How is this treated? Treatment for this condition depends on the cause. Treatment may include:  Antibiotic medicine for a bacterial infection.  Drinking more fluids.  Removing a stone or obstruction.  Treating an underlying disease that is causing parotitis.  Surgery to drain an infection, remove a growth, or remove the whole gland (parotidectomy). Treatment may not be needed if parotid swelling goes away with home care. Follow these instructions at home: Medicines   Take over-the-counter and prescription medicines only as told by your health care provider.  If you were prescribed an antibiotic medicine, take it as told by your health care provider. Do not stop taking the antibiotic even if you start to feel better. Managing pain and swelling  If directed, apply heat to the affected area as often as told by your health care provider. Use the heat source that your health care provider recommends, such as a moist heat pack or a heating pad. To apply the heat: ? Place a towel between your skin and the  heat source. ? Leave the heat on for 20-30 minutes. ? Remove the heat if your skin turns bright red. This is especially important if you are unable to feel pain, heat, or cold. You may have a greater risk of getting burned.  Gargle with a salt-water mixture 3-4 times a day or as needed. To make a salt-water mixture, completely dissolve -1 tsp (3-6 g) of salt in 1 cup (237 mL) of warm water.  Gently massage the parotid glands as told by your health care  provider. General instructions   Drink enough fluid to keep your urine pale yellow.  Keep your mouth clean and moist.  Try sucking on sour candy. This may help to make your mouth less dry by stimulating the flow of saliva.  Maintain good oral health. ? Brush your teeth at least two times a day. ? Floss your teeth every day. ? See your dentist regularly.  Do not use any products that contain nicotine or tobacco, such as cigarettes, e-cigarettes, and chewing tobacco. If you need help quitting, ask your health care provider.  Do not drink alcohol.  Keep all follow-up visits as told by your health care provider. This is important. Contact a health care provider if:  You have a fever or chills.  You have new symptoms.  Your symptoms get worse.  Your symptoms do not improve with treatment. Get help right away if:  You have difficulty breathing or swallowing because of the swollen gland. Summary  Parotitis is inflammation of one or both of your parotid glands.  Symptoms include pain and swelling under and in front of the ear. They may also include a fever and a bad taste in your mouth.  This condition may be treated with antibiotics, increasing fluids, or surgery.  In some cases, parotitis may go away on its own without treatment.  You should drink plenty of fluids, maintain good oral hygiene, and avoid tobacco products. This information is not intended to replace advice given to you by your health care provider. Make sure you discuss any questions you have with your health care provider. Document Released: 03/08/2002 Document Revised: 04/14/2018 Document Reviewed: 04/14/2018 Elsevier Interactive Patient Education  2019 [employer].

## 2019-03-03 NOTE — Progress Notes (Signed)
Assessment and Plan:  Laura Maynard was seen today for adenopathy.  Diagnoses and all orders for this visit:  Acute noninfective otitis externa of both ears, unspecified type -     acetic acid-hydrocortisone (VOSOL-HC) OTIC solution; Place 3 drops into both ears 2 (two) times daily as needed. As needed to ear canals after getting ears wet to prevent infection.  Parotiditis Hx of remote similar; has seen Dr. Lucia Gaskins; no further workup was recommened Follow up if not improving -     predniSONE (DELTASONE) 20 MG tablet; 2 tablets daily for 3 days, 1 tablet daily for 4 days. -     doxycycline (VIBRAMYCIN) 100 MG capsule; Take 1 capsule twice daily with food  Further disposition pending results of labs. Discussed med's effects and SE's.   Over 30 minutes of exam, counseling, chart review, and critical decision making was performed.   Future Appointments  Date Time Provider [place]  03/31/2019  9:30 AM [name], MD [facility] None  04/22/2019  9:00 AM Philemon Kingdom, MD [facility] None  05/24/2019  8:00 AM Pixie Casino, MD [facility]  10/14/2019  2:00 PM [name], MD [facility] None  01/13/2020  9:00 AM Liane Comber, NP [facility] None    ------------------------------------------------------------------------------------------------------------------   HPI 73 y.o.female with hx of allergies presents for evaluation of bilateral ear pressure ongoing for 5 days. She reports she washed her hair the day prior, historically very commonly will have ear irritation after getting wet.   She reports bilateral ear fullness/pressure persistent. Denies sharp/stabbing.   Denies changes in hearing, denies discharge  She endorses running nose consistent with baseline; rarely congested (uses saline/flonase). Denies sore throat/coughing, fever/chills. Reports ongoing intermittent sinus headaches consistent with baseline/unchanged. Takes zyrtec-D, may take aleve PRN.    Past Medical History:  Diagnosis Date  . Blind right eye    legally  . 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 High cholesterol   . Hypertension   . Hypothyroidism   . Migraine   . Prediabetes   . Sarcoidosis, lung ([place]) 15 YRS AGO   NO TREATMENTS  . 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 Vertigo   . Vitamin D deficiency      Allergies  Allergen Reactions  . Penicillins Hives  . Pravastatin     Myalgia  . Sulfa Antibiotics Hives  . Zetia [Ezetimibe]     Myalgia    Current Outpatient Medications on File Prior to Visit  Medication Sig  . Acetaminophen (TYLENOL) 325 MG CAPS as needed.   . butalbital-acetaminophen-caffeine (FIORICET) 50-325-40 MG tablet Take 1 tablet up to 4 x /day if needed for Severe Headache  . cetirizine-pseudoephedrine (ZYRTEC-D) 5-120 MG tablet Take 1 tablet by mouth 2 (two) times daily. (Patient taking differently: Take 1 tablet by mouth 2 (two) times daily. Takes one tablet daily)  . levothyroxine (SYNTHROID, LEVOTHROID) 50 MCG tablet TAKE 1 AND ONE-HALF TABLETS BY MOUTH DAILY.  Pt states alternating 83mcg and 20mcg every other day.  . meclizine (ANTIVERT) 25 MG tablet Take 1 tablet 3 x d if needed for Dizziness / Vertigo  . naproxen sodium (ANAPROX) 220 MG tablet Take 440 mg by mouth 2 (two) times daily as needed (pain).  . rosuvastatin (CRESTOR) 40 MG tablet Take 1/2 to 1 tablet daily or as directed for Cholesterol (Patient not taking: Reported on 10/29/2018)   No current facility-administered medications on file prior to visit.     ROS: all negative except above.   Physical Exam:  BP 104/64  Pulse 92   Temp 97.9 F (36.6 C)   Ht 5\' 6"  (1.676 m)   Wt 168 lb (76.2 kg)   SpO2 96%   BMI 27.12 kg/m   General Appearance: Well nourished, in no apparent distress. Eyes: PERRLA, conjunctiva no swelling or erythema Sinuses: No Frontal/maxillary tenderness ENT/Mouth: Ext aud canals clear, TMs without erythema, bulging. No erythema, swelling,  or exudate on post pharynx.  Tonsils not swollen or erythematous. Hearing normal. She has mildly enlarged and tenderness of left parotid Neck: Supple, thyroid normal.  Respiratory: Respiratory effort normal, BS equal bilaterally without rales, rhonchi, wheezing or stridor.  Cardio: RRR with no MRGs. Brisk peripheral pulses without edema.  Lymphatics: Non tender without lymphadenopathy.  Musculoskeletal: No TMJ tenderness bil, no crepitus, no dislocation Skin: Warm, dry without rashes, lesions, ecchymosis.  Neuro: Cranial nerves intact.  Psych: Awake and oriented X 3, normal affect, Insight and Judgment appropriate.     Izora Ribas, NP 2:51 PM [facility]

## 2019-03-05 ENCOUNTER — Other Ambulatory Visit: Payer: Medicare Other | Admitting: Internal Medicine

## 2019-03-05 DIAGNOSIS — H60503 Unspecified acute noninfective otitis externa, bilateral: Secondary | ICD-10-CM | POA: Diagnosis not present

## 2019-03-05 DIAGNOSIS — T7840XA Allergy, unspecified, initial encounter: Secondary | ICD-10-CM

## 2019-03-05 DIAGNOSIS — K1120 Sialoadenitis, unspecified: Secondary | ICD-10-CM | POA: Diagnosis not present

## 2019-03-05 MED ORDER — AZITHROMYCIN 250 MG PO TABS: ORAL_TABLET | ORAL | 1 refills | Status: DC

## 2019-03-05 MED ORDER — PREDNISONE 20 MG PO TABS: ORAL_TABLET | ORAL | 0 refills | Status: DC

## 2019-03-05 NOTE — Progress Notes (Signed)
THIS ENCOUNTER IS A VIRTUAL VISIT DUE TO COVID-19 - PATIENT WAS NOT SEEN IN THE OFFICE.  PATIENT HAS CONSENTED TO VIRTUAL VISIT / TELEMEDICINE VISIT   Virtual Visit via telephone Note  I connected with  Laura Maynard   on 03/05/2019 03/05/2019  by telephone.  I verified that I am speaking with the correct person using two identifiers.    I discussed the limitations of evaluation and management by telemedicine and the availability of in person appointments. The patient expressed understanding and agreed to proceed.  History of Present Illness:    This very nice 73 y.o. DBF with  HTN, HLD, Hypothyroidism, Prediabetes  and Vitamin D Deficiency was seen 3 days ago and dx'd with Otitis & Parotiditis and treated with Doxycycline and 3 day Prednisone course. Today she developed a morbilliform rash of her face  Suspected Tetracycline allergy and is advised to stop. She still has discomfort in her ears and cheeks.   Medications  Current Outpatient Medications (Endocrine & Metabolic):  .  levothyroxine (SYNTHROID, LEVOTHROID) 50 MCG tablet, TAKE 1 AND ONE-HALF TABLETS BY MOUTH DAILY.  Pt states alternating 59mcg and 56mcg every other day. .  predniSONE (DELTASONE) 20 MG tablet, 1 tab 3 x day for 3 days, then 1 tab 2 x day for 3 days, then 1 tab 1 x day for 5 days  Current Outpatient Medications (Cardiovascular):  .  rosuvastatin (CRESTOR) 40 MG tablet, Take 1/2 to 1 tablet daily or as directed for Cholesterol (Patient not taking: Reported on 10/29/2018)  Current Outpatient Medications (Respiratory):  .  cetirizine-pseudoephedrine (ZYRTEC-D) 5-120 MG tablet, Take 1 tablet by mouth 2 (two) times daily. (Patient taking differently: Take 1 tablet by mouth 2 (two) times daily. Takes one tablet daily)  Current Outpatient Medications (Analgesics):  Marland Kitchen  Acetaminophen (TYLENOL) 325 MG CAPS, as needed.  .  butalbital-acetaminophen-caffeine (FIORICET) 50-325-40 MG tablet, Take 1 tablet up to 4 x /day if needed  for Severe Headache .  naproxen sodium (ANAPROX) 220 MG tablet, Take 440 mg by mouth 2 (two) times daily as needed (pain).   Current Outpatient Medications (Other):  .  acetic acid-hydrocortisone (VOSOL-HC) OTIC solution, Place 3 drops into both ears 2 (two) times daily as needed. As needed to ear canals after getting ears wet to prevent infection. Marland Kitchen  azithromycin (ZITHROMAX) 250 MG tablet, Take 2 tablets (500 mg) on  Day 1,  followed by 1 tablet (250 mg) once daily on Days 2 through 5. .  doxycycline (VIBRAMYCIN) 100 MG capsule, Take 1 capsule twice daily with food .  meclizine (ANTIVERT) 25 MG tablet, Take 1 tablet 3 x d if needed for Dizziness / Vertigo  Problem list She has Labile hypertension; History of prediabetes; Vitamin D deficiency; Hyperlipidemia, mixed; Poor compliance with medication; Benign paroxysmal positional vertigo; TMJ (temporomandibular joint syndrome); Postablative hypothyroidism; BMI 26.0-26.9,adult; Medication Phobia; Multinodular goiter; FH: hypertension; and Abnormal glucose on their problem list.   Observations/Objective:  General : Well sounding patient in no apparent distress HEENT: no hoarseness, no cough for duration of visit Lungs: speaks in complete sentences, no audible wheezing, no apparent distress Neurological: alert, oriented x 3 Psychiatric: pleasant, judgement appropriate   Assessment and Plan:  1. Allergic reaction, initial encounter / Allergy to Doxycycline  - predniSONE (DELTASONE) 20 MG tablet; 1 tab 3 x day for 3 days, then 1 tab 2 x day for 3 days, then 1 tab 1 x day for 5 days  Dispense: 20 tablet; Refill: 0  2. Acute noninfective otitis externa of both ears, unspecified type  - azithromycin (ZITHROMAX) 250 MG tablet; Take 2 tablets (500 mg) on  Day 1,  followed by 1 tablet (250 mg) once daily on Days 2 through 5.  Dispense: 6 each; Refill: 1  3. Parotiditis  - azithromycin (ZITHROMAX) 250 MG tablet; Take 2 tablets (500 mg) on  Day 1,   followed by 1 tablet (250 mg) once daily on Days 2 through 5.  Dispense: 6 each; Refill: 1  Follow Up Instructions:  I discussed the assessment and treatment plan with the patient. The patient was provided an opportunity to ask questions and all were answered. The patient agreed with the plan and demonstrated an understanding of the instructions.   The patient was advised to call back or seek an in-person evaluation if the symptoms worsen or if the condition fails to improve as anticipated.  I provided 15 minutes of non-face-to-face time during this encounter.  Kirtland Bouchard, MD

## 2019-03-24 ENCOUNTER — Encounter: Payer: Self-pay | Admitting: Internal Medicine

## 2019-03-31 ENCOUNTER — Ambulatory Visit: Payer: Self-pay | Admitting: Internal Medicine

## 2019-04-19 ENCOUNTER — Other Ambulatory Visit: Payer: Self-pay

## 2019-04-21 DIAGNOSIS — K118 Other diseases of salivary glands: Secondary | ICD-10-CM | POA: Insufficient documentation

## 2019-04-22 ENCOUNTER — Encounter: Payer: Self-pay | Admitting: Internal Medicine

## 2019-04-22 ENCOUNTER — Other Ambulatory Visit: Payer: Self-pay

## 2019-04-22 ENCOUNTER — Ambulatory Visit (INDEPENDENT_AMBULATORY_CARE_PROVIDER_SITE_OTHER): Payer: Medicare Other | Admitting: Internal Medicine

## 2019-04-22 VITALS — BP 118/72 | HR 83 | Ht 66.0 in | Wt 168.0 lb

## 2019-04-22 DIAGNOSIS — E559 Vitamin D deficiency, unspecified: Secondary | ICD-10-CM | POA: Diagnosis not present

## 2019-04-22 DIAGNOSIS — E890 Postprocedural hypothyroidism: Secondary | ICD-10-CM

## 2019-04-22 DIAGNOSIS — E042 Nontoxic multinodular goiter: Secondary | ICD-10-CM

## 2019-04-22 LAB — T4, FREE: Free T4: 0.89 ng/dL (ref 0.60–1.60)

## 2019-04-22 LAB — VITAMIN D 25 HYDROXY (VIT D DEFICIENCY, FRACTURES): VITD: 24.26 ng/mL — ABNORMAL LOW (ref 30.00–100.00)

## 2019-04-22 LAB — TSH: TSH: 3.11 u[IU]/mL (ref 0.35–4.50)

## 2019-04-22 NOTE — Patient Instructions (Addendum)
Please stop at the lab.  Continue vit D 1000 units daily.  Continue Levothyroxine 75 alternating with 50 mcg every other day.  Take the thyroid hormone every day, with water, at least 30 minutes before breakfast, separated by at least 4 hours from: - acid reflux medications - calcium - iron - multivitamins  Please come back for a follow-up appointment in 6 months.

## 2019-04-22 NOTE — Progress Notes (Signed)
Patient ID: Laura Maynard, female   DOB: [birth year], 73 y.o.   MRN: [id number]   HPI  Laura Maynard is a 73 y.o.-year-old female, returning for f/u for postablative hypothyroidism after toxic multinodular goiter (TMNG) RAI tx. Last visit 6 months ago.  Reviewed history: Pt had screening labs in 01/2014 and was found to have a low TSH. This was repeated 2x and the TSH continued to decrease.   Thyroid Uptake and scan (05/20/2014) >> uptake 48.5% and scan c/w TMNG.  We started MMI 5 mg bid.  Had RAI tx on 06/09/2014.   After RAI treatment, she developed post-ablative hypothyroidism >> we started levothyroxine 50 g daily. She developed leg cramps, which resolved after stopping levothyroxine but her TSH started to increase, so we change to Tirosint. Unfortunately, she could not afford this and we had to restart levothyroxine at the lower dose, subsequent to be increased back to 50 g daily (on 08/12/2016).  Her TSH became suppressed after increased the dose to 75 mcg daily.  She had itching point the 75 mcg tablet so we switched to only 50 mcg tablets.  She is tolerating this well.  Pt is on levothyroxine 50 alternating with 75 mcg every other day, taken: - in am - fasting - at least 1h from b'fast - no Ca, Fe, MVI, PPIs - not on Biotin  Review TFTs: Lab Results  Component Value Date   TSH 2.79 10/29/2018   TSH 4.71 (H) 09/17/2018   TSH 1.93 06/15/2018   TSH 2.69 02/05/2018   TSH 0.75 10/14/2017   TSH 0.29 (L) 08/28/2017   TSH 0.16 (L) 06/23/2017   TSH 5.51 (H) 04/28/2017   TSH 3.96 02/04/2017   TSH 3.77 11/05/2016   FREET4 0.95 10/29/2018   FREET4 0.95 10/14/2017   FREET4 1.14 08/28/2017   FREET4 1.11 06/23/2017   FREET4 0.91 04/28/2017   FREET4 0.87 10/02/2016   FREET4 0.65 08/12/2016   FREET4 0.44 (L) 06/26/2016   FREET4 0.79 02/20/2016   FREET4 0.83 11/09/2015    Pt denies: - feeling nodules in neck - hoarseness - dysphagia - choking - SOB with lying down  She  also has a history of HTN, HL, and anemia.  Vitamin D deficiency -severe: Lab Results  Component Value Date   VD25OH 6 (L) 09/17/2018   VD25OH 12 (L) 02/05/2018   VD25OH 16.80 (L) 06/23/2017   VD25OH 23.88 (L) 04/28/2017   VD25OH 20 (L) 02/04/2017   VD25OH 8 (L) 11/05/2016   VD25OH 12 (L) 01/16/2016   VD25OH 13 (L) 07/27/2015   VD25OH 15 (L) 04/19/2015   VD25OH 20 (L) 12/27/2014   She was previously on 5000 units vitamin D daily but was not compliant with this due to headaches and itching.  Before last visit, she started vitamin D 1000 units daily.  As well.  ROS: Constitutional: no weight gain/no weight loss, no fatigue, no subjective hyperthermia, no subjective hypothermia, + nocturia Eyes: no blurry vision, no xerophthalmia ENT: no sore throat,  + see HPI Cardiovascular: no CP/no SOB/no palpitations/no leg swelling Respiratory: no cough/no SOB/no wheezing Gastrointestinal: no N/no V/no D/no C/no acid reflux Musculoskeletal: + muscle aches/+ joint aches Skin: no rashes, + hair loss Neurological: no tremors/no numbness/no tingling/no dizziness  I reviewed pt's medications, allergies, PMH, social hx, family hx, and changes were documented in the history of present illness. Otherwise, unchanged from my initial visit note.  Past Medical History:  Diagnosis Date  . Blind right eye  legally  . 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 High cholesterol   . Hypertension   . Hypothyroidism   . Migraine   . Prediabetes   . Sarcoidosis, lung ([place]) 15 YRS AGO   NO TREATMENTS  . 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 Vertigo   . Vitamin D deficiency    Past Surgical History:  Procedure Laterality Date  . BACK SURGERY  1970  . CATARACT EXTRACTION Left 12/2017  . COLONOSCOPY  2006; 03/28/11   2 small adenomas; normal  . EYE SURGERY  1964   right eye  . KNEE ARTHROSCOPY  2002  . NASAL SEPTUM SURGERY  1998  . TOTAL ABDOMINAL HYSTERECTOMY  2000   Social History    Socioeconomic History  . Marital status: Divorced    Spouse name: Not on file  . Number of children: 0  . Years of education: college  . Highest education level: Not on file  Occupational History    Comment: Retired  Scientific laboratory technician  . Financial resource strain: Not on file  . Food insecurity    Worry: Not on file    Inability: Not on file  . Transportation needs    Medical: Not on file    Non-medical: Not on file  Tobacco Use  . Smoking status: Never Smoker  . Smokeless tobacco: Never Used  Substance and Sexual Activity  . Alcohol use: Never    Alcohol/week: 1.0 standard drinks    Types: 1 Standard drinks or equivalent per week    Frequency: Never    Comment: RARE  . Drug use: Never  . Sexual activity: Not on file  Lifestyle  . Physical activity    Days per week: Not on file    Minutes per session: Not on file  . Stress: Not on file  Relationships  . Social Herbalist on phone: Not on file    Gets together: Not on file    Attends religious service: Not on file    Active member of club or organization: Not on file    Attends meetings of clubs or organizations: Not on file    Relationship status: Not on file  . Intimate partner violence    Fear of current or ex partner: Not on file    Emotionally abused: Not on file    Physically abused: Not on file    Forced sexual activity: Not on file  Other Topics Concern  . Not on file  Social History Narrative   Patient is retired and lives at home alone. Patient  has college education.   Caffeine- 3 cups of caffeine daily.  One soda daily.   Right handed.   Current Outpatient Medications on File Prior to Visit  Medication Sig Dispense Refill  . Acetaminophen (TYLENOL) 325 MG CAPS as needed.     Marland Kitchen acetic acid-hydrocortisone (VOSOL-HC) OTIC solution Place 3 drops into both ears 2 (two) times daily as needed. As needed to ear canals after getting ears wet to prevent infection. 10 mL 1  . azithromycin (ZITHROMAX)  250 MG tablet Take 2 tablets (500 mg) on  Day 1,  followed by 1 tablet (250 mg) once daily on Days 2 through 5. 6 each 1  . butalbital-acetaminophen-caffeine (FIORICET) 50-325-40 MG tablet Take 1 tablet up to 4 x /day if needed for Severe Headache 20 tablet 0  . cetirizine-pseudoephedrine (ZYRTEC-D) 5-120 MG tablet Take 1 tablet by mouth 2 (two) times daily. (Patient taking differently: Take 1  tablet by mouth 2 (two) times daily. Takes one tablet daily) 60 tablet 2  . doxycycline (VIBRAMYCIN) 100 MG capsule Take 1 capsule twice daily with food 20 capsule 0  . levothyroxine (SYNTHROID, LEVOTHROID) 50 MCG tablet TAKE 1 AND ONE-HALF TABLETS BY MOUTH DAILY.  Pt states alternating 58mcg and 80mcg every other day. 150 tablet 3  . meclizine (ANTIVERT) 25 MG tablet Take 1 tablet 3 x d if needed for Dizziness / Vertigo 100 tablet 1  . naproxen sodium (ANAPROX) 220 MG tablet Take 440 mg by mouth 2 (two) times daily as needed (pain).    . predniSONE (DELTASONE) 20 MG tablet 1 tab 3 x day for 3 days, then 1 tab 2 x day for 3 days, then 1 tab 1 x day for 5 days 20 tablet 0  . rosuvastatin (CRESTOR) 40 MG tablet Take 1/2 to 1 tablet daily or as directed for Cholesterol (Patient not taking: Reported on 10/29/2018) 30 tablet 5   No current facility-administered medications on file prior to visit.    Allergies  Allergen Reactions  . Penicillins Hives  . Pravastatin     Myalgia  . Sulfa Antibiotics Hives  . Zetia [Ezetimibe]     Myalgia   Family History  Problem Relation Age of Onset  . Lung cancer Sister 34  . Throat cancer Brother 34  . Stroke Mother   . Emphysema Father   . COPD Sister   . Diabetes Brother   . Colon cancer Neg Hx    PE: BP 118/72   Pulse 83   Ht 5\' 6"  (1.676 m)   Wt 168 lb (76.2 kg)   SpO2 98%   BMI 27.12 kg/m  There is no height or weight on file to calculate BMI. Wt Readings from Last 3 Encounters:  04/22/19 168 lb (76.2 kg)  03/03/19 168 lb (76.2 kg)  12/29/18 165 lb  (74.8 kg)   Constitutional: normal weight, in NAD Eyes: PERRLA, EOMI, no exophthalmos ENT: moist mucous membranes, no thyromegaly, no cervical lymphadenopathy Cardiovascular: RRR, No MRG Respiratory: CTA B Gastrointestinal: abdomen soft, NT, ND, BS+ Musculoskeletal: no deformities, strength intact in all 4 Skin: moist, warm, no rashes Neurological: no tremor with outstretched hands, DTR normal in all 4  ASSESSMENT: 1. Hypothyroidism after RAI tx  -In the past, she wanted to just see PCP for the hypothyroidism, but she returns 6 months ago and would like to continue to follow with me  2. MNG  3. vitamin D deficiency  PLAN:  1. And 2.  Patient with history of thyrotoxicosis due to toxic multinodular goiter, resolved after RAI treatment.  Now on levothyroxine she initially had severe leg cramps with this but she was also found to have a very low vitamin D and was started on supplementation.  We tried to use Tirosint (liquid levothyroxine) but this was too expensive for her.  She also developed itching on the 75 mcg levothyroxine tablet so she is not taking only 50 mcg tablets, which are free of preservatives.  No more itching since switching the tablets. - She denies neck compression symptoms - latest thyroid labs reviewed with pt >> normal 09/2018 - she continues on LT4 50 alternating with 75 mcg every other day - pt feels good on this dose. - we discussed about taking the thyroid hormone every day, with water, >30 minutes before breakfast, separated by >4 hours from acid reflux medications, calcium, iron, multivitamins. Pt. is taking it correctly. - will check thyroid  tests today: TSH and fT4 - If labs are abnormal, she will need to return for repeat TFTs in 1.5 months - Otherwise, we will see her back in 6 mo >> per her preference  3.  vitamin D deficiency  -She had a very low level of vitamin D at last visit.  Unfortunately, she could not tolerate 5000 units vitamin D daily I  advised her to get the gluten-free formulation. -Before last visit, she just added 1000 units vitamin D, which she continues  Today - recheck level today  Component     Latest Ref Rng & Units 04/22/2019  TSH     0.35 - 4.50 uIU/mL 3.11  T4,Free(Direct)     0.60 - 1.60 ng/dL 0.89  VITD     30.00 - 100.00 ng/mL 24.26 (L)   TFTs are normal, vitamin D is much improved, but still lower than target.  I will advise her to try to take 2000 units of vitamin D daily.  Philemon Kingdom, MD PhD [facility]

## 2019-04-23 ENCOUNTER — Telehealth: Payer: Self-pay

## 2019-04-23 NOTE — Telephone Encounter (Signed)
-----   Message from Philemon Kingdom, MD sent at 04/23/2019  8:32 AM EDT ----- [name], can you please call pt: TFTs are normal so please continue the current dose of levothyroxine. Vitamin D is much improved, but still lower than target.  Please advise her to try to take 2000 units of vitamin D daily (to double up on the dose she is taking now, if tolerated).

## 2019-04-23 NOTE — Telephone Encounter (Signed)
Notified patient of message from Dr. Gherghe, patient expressed understanding and agreement. No further questions.  

## 2019-04-28 ENCOUNTER — Encounter: Payer: Self-pay | Admitting: Adult Health

## 2019-04-28 ENCOUNTER — Other Ambulatory Visit: Payer: Self-pay

## 2019-04-28 ENCOUNTER — Ambulatory Visit (INDEPENDENT_AMBULATORY_CARE_PROVIDER_SITE_OTHER): Payer: Medicare Other | Admitting: Adult Health

## 2019-04-28 VITALS — BP 124/66 | HR 107 | Temp 97.7°F | Ht 66.0 in | Wt 168.0 lb

## 2019-04-28 DIAGNOSIS — S61209A Unspecified open wound of unspecified finger without damage to nail, initial encounter: Secondary | ICD-10-CM

## 2019-04-28 NOTE — Progress Notes (Signed)
Assessment and Plan:  Laura Maynard was seen today for finger injury.  Diagnoses and all orders for this visit:  Open wound of finger, initial encounter Reassured patient; no signs of infection; no need for oral antibiotic continue monitoring and regular dressing changes Applied dressing of triple antibiotic, gauze, tegaderm; addition supplies given and that she may purchase similar at pharmacy Monitor for redness, increasing pain, warmth, fever/chills, purulent discharge and call office if she has any of these; on call phone over the weekend if needed  Further disposition pending results of labs. Discussed med's effects and SE's.   Over 15 minutes of exam, counseling, chart review, and critical decision making was performed.   Future Appointments  Date Time Provider [place]  05/13/2019  9:30 AM [name], MD [facility] None  05/24/2019  8:00 AM Pixie Casino, MD [facility]  10/14/2019  2:00 PM [name], MD [facility] None  10/26/2019  9:00 AM Philemon Kingdom, MD [facility] None  01/13/2020  9:00 AM Liane Comber, NP [facility] None    ------------------------------------------------------------------------------------------------------------------   HPI Ht 5\' 6"  (1.676 m)   Wt 168 lb (76.2 kg)   BMI 27.12 kg/m   73 y.o.female presents for evaluation of wound sustained to left 2nd digit approx 1 week ago when she "pinched" it in a door. Has been keeping covered, concerned not healing well due to having to wash hands so frequently. Denies significant discharge, fever/chills, increasing pain. Does endorse some local tenderness. She is quite anxious regarding possible infection as she lives by herself.   Td UTD last in 2013   Past Medical History:  Diagnosis Date  . Blind right eye    legally  . 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 High cholesterol   . Hypertension   . Hypothyroidism   . Migraine   . Prediabetes   . Sarcoidosis, lung  ([place]) 15 YRS AGO   NO TREATMENTS  . 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 Vertigo   . Vitamin D deficiency      Allergies  Allergen Reactions  . Penicillins Hives  . Pravastatin     Myalgia  . Sulfa Antibiotics Hives  . Zetia [Ezetimibe]     Myalgia    Current Outpatient Medications on File Prior to Visit  Medication Sig  . Acetaminophen (TYLENOL) 325 MG CAPS as needed.   Marland Kitchen acetic acid-hydrocortisone (VOSOL-HC) OTIC solution Place 3 drops into both ears 2 (two) times daily as needed. As needed to ear canals after getting ears wet to prevent infection.  . butalbital-acetaminophen-caffeine (FIORICET) 50-325-40 MG tablet Take 1 tablet up to 4 x /day if needed for Severe Headache  . cetirizine-pseudoephedrine (ZYRTEC-D) 5-120 MG tablet Take 1 tablet by mouth 2 (two) times daily. (Patient taking differently: Take 1 tablet by mouth as needed. Takes one tablet daily)  . levothyroxine (SYNTHROID, LEVOTHROID) 50 MCG tablet TAKE 1 AND ONE-HALF TABLETS BY MOUTH DAILY.  Pt states alternating 48mcg and 17mcg every other day.  . naproxen sodium (ANAPROX) 220 MG tablet Take 440 mg by mouth 2 (two) times daily as needed (pain).  . rosuvastatin (CRESTOR) 40 MG tablet Take 1/2 to 1 tablet daily or as directed for Cholesterol   No current facility-administered medications on file prior to visit.     ROS: all negative except above.   Physical Exam:  Ht 5\' 6"  (1.676 m)   Wt 168 lb (76.2 kg)   BMI 27.12 kg/m   General Appearance: Well nourished, in no apparent distress. Eyes:  PERRL, conjunctiva no swelling or erythema ENT/Mouth: Hearing normal.  Neck: Supple Respiratory: Respiratory effort normal Cardio: RRR with no MRGs. Brisk peripheral pulses without edema.  Musculoskeletal: Full ROM, no bony tenderness, normal gait.   Skin: Warm, dry without rashes, lesions, ecchymosis. She has open wound to left 2nd digit ventral surface, approx 0.5 x 0.8 cm area, without significant discharge;  non-erythematous, no edema; mildly tender. Blanches quickly distally.  Neuro: Sensation intact.  Psych: Awake and oriented X 3, anxious affect, Insight and Judgment appropriate.     Izora Ribas, NP 8:50 AM [facility]

## 2019-04-28 NOTE — Patient Instructions (Addendum)
Clean wound, apply triple antibiotic ointment, gauze, then tegaderm/clear dressing  Monitor for redness, white discharge, increasing pain, swelling, fever/chills   Wound Care, Adult Taking care of your wound properly can help to prevent pain, infection, and scarring. It can also help your wound to heal more quickly. How to care for your wound Wound care      Follow instructions from your health care provider about how to take care of your wound. Make sure you: ? Wash your hands with soap and water before you change the bandage (dressing). If soap and water are not available, use hand sanitizer. ? Change your dressing as told by your health care provider. ? Leave stitches (sutures), skin glue, or adhesive strips in place. These skin closures may need to stay in place for 2 weeks or longer. If adhesive strip edges start to loosen and curl up, you may trim the loose edges. Do not remove adhesive strips completely unless your health care provider tells you to do that.  Check your wound area every day for signs of infection. Check for: ? Redness, swelling, or pain. ? Fluid or blood. ? Warmth. ? Pus or a bad smell.  Ask your health care provider if you should clean the wound with mild soap and water. Doing this may include: ? Using a clean towel to pat the wound dry after cleaning it. Do not rub or scrub the wound. ? Applying a cream or ointment. Do this only as told by your health care provider. ? Covering the incision with a clean dressing.  Ask your health care provider when you can leave the wound uncovered.  Keep the dressing dry until your health care provider says it can be removed. Do not take baths, swim, use a hot tub, or do anything that would put the wound underwater until your health care provider approves. Ask your health care provider if you can take showers. You may only be allowed to take sponge baths. Medicines   If you were prescribed an antibiotic medicine, cream,  or ointment, take or use the antibiotic as told by your health care provider. Do not stop taking or using the antibiotic even if your condition improves.  Take over-the-counter and prescription medicines only as told by your health care provider. If you were prescribed pain medicine, take it 30 or more minutes before you do any wound care or as told by your health care provider. General instructions  Return to your normal activities as told by your health care provider. Ask your health care provider what activities are safe.  Do not scratch or pick at the wound.  Do not use any products that contain nicotine or tobacco, such as cigarettes and e-cigarettes. These may delay wound healing. If you need help quitting, ask your health care provider.  Keep all follow-up visits as told by your health care provider. This is important.  Eat a diet that includes protein, vitamin A, vitamin C, and other nutrient-rich foods to help the wound heal. ? Foods rich in protein include meat, dairy, beans, nuts, and other sources. ? Foods rich in vitamin A include carrots and dark green, leafy vegetables. ? Foods rich in vitamin C include citrus, tomatoes, and other fruits and vegetables. ? Nutrient-rich foods have protein, carbohydrates, fat, vitamins, or minerals. Eat a variety of healthy foods including vegetables, fruits, and whole grains. Contact a health care provider if:  You received a tetanus shot and you have swelling, severe pain, redness, or bleeding at  the injection site.  Your pain is not controlled with medicine.  You have redness, swelling, or pain around the wound.  You have fluid or blood coming from the wound.  Your wound feels warm to the touch.  You have pus or a bad smell coming from the wound.  You have a fever or chills.  You are nauseous or you vomit.  You are dizzy. Get help right away if:  You have a red streak going away from your wound.  The edges of the wound open up  and separate.  Your wound is bleeding, and the bleeding does not stop with gentle pressure.  You have a rash.  You faint.  You have trouble breathing. Summary  Always wash your hands with soap and water before changing your bandage (dressing).  To help with healing, eat foods that are rich in protein, vitamin A, vitamin C, and other nutrients.  Check your wound every day for signs of infection. Contact your health care provider if you suspect that your wound is infected. This information is not intended to replace advice given to you by your health care provider. Make sure you discuss any questions you have with your health care provider. Document Released: 06/25/2008 Document Revised: 01/04/2019 Document Reviewed: 04/02/2016 Elsevier Patient Education  2020 [employer].

## 2019-05-12 ENCOUNTER — Telehealth: Payer: Self-pay | Admitting: Internal Medicine

## 2019-05-12 NOTE — Telephone Encounter (Signed)
LM to discuss appointment. Patient is scheduled for NEW LIPID consult on 05/24/2019. This is a VIRTUAL CLINIC day. Per appointment notes, patient wants IN OFFICE appointment.   When she calls back, she is OK to stay virtual if she chooses. If she prefers in office, first available is 06/29/19

## 2019-05-13 ENCOUNTER — Other Ambulatory Visit: Payer: Self-pay

## 2019-05-13 ENCOUNTER — Encounter: Payer: Self-pay | Admitting: Internal Medicine

## 2019-05-13 ENCOUNTER — Ambulatory Visit (INDEPENDENT_AMBULATORY_CARE_PROVIDER_SITE_OTHER): Payer: Medicare Other | Admitting: Internal Medicine

## 2019-05-13 ENCOUNTER — Other Ambulatory Visit: Payer: Self-pay | Admitting: Internal Medicine

## 2019-05-13 VITALS — BP 116/74 | HR 76 | Temp 97.9°F | Resp 16 | Ht 66.0 in | Wt 168.4 lb

## 2019-05-13 DIAGNOSIS — E782 Mixed hyperlipidemia: Secondary | ICD-10-CM | POA: Diagnosis not present

## 2019-05-13 DIAGNOSIS — R7309 Other abnormal glucose: Secondary | ICD-10-CM

## 2019-05-13 DIAGNOSIS — R0989 Other specified symptoms and signs involving the circulatory and respiratory systems: Secondary | ICD-10-CM | POA: Diagnosis not present

## 2019-05-13 DIAGNOSIS — E559 Vitamin D deficiency, unspecified: Secondary | ICD-10-CM | POA: Diagnosis not present

## 2019-05-13 DIAGNOSIS — F40298 Other specified phobia: Secondary | ICD-10-CM

## 2019-05-13 DIAGNOSIS — Z79899 Other long term (current) drug therapy: Secondary | ICD-10-CM

## 2019-05-13 DIAGNOSIS — E890 Postprocedural hypothyroidism: Secondary | ICD-10-CM

## 2019-05-13 NOTE — Progress Notes (Signed)
History of Present Illness:      This very nice 73 y.o. DBF  presents for 6 month follow up with HTN, HLD, Pre-Diabetes and Vitamin D Deficiency. Patient has long hx/o poor medication compliance and medicine phobia.       Patient is followed expectantly for labile HTN (2012) & BP has been controlled at home. Today's BP is at goal -  116/74. Patient has had no complaints of any cardiac type chest pain, palpitations, dyspnea / orthopnea / PND, dizziness, claudication, or dependent edema.      Hyperlipidemia is not controlled with diet & she has been reticent to take med for Cholesterol. Last Lipids were not at goal: Lab Results  Component Value Date   CHOL 296 (H) 09/17/2018   HDL 109 09/17/2018   LDLCALC 167 (H) 09/17/2018   TRIG 93 09/17/2018   CHOLHDL 2.7 09/17/2018       Also, the patient has history of PreDiabetes (A1c 5.7% / 2012)  and has had no symptoms of reactive hypoglycemia, diabetic polys, paresthesias or visual blurring.  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       Further, the patient also has history of Vitamin D Deficiency ("10" / 2010 and "8" / 2018)   and she alleges that she supplements vitamin D. Last vitamin D done recently by Dr Cruzita Lederer was still low: Lab Results  Component Value Date   VD25OH 24.26 (L) 04/22/2019   Current Outpatient Medications on File Prior to Visit  Medication Sig  . Acetaminophen (TYLENOL) 325 MG CAPS as needed.   Marland Kitchen acetic acid-hydrocortisone (VOSOL-HC) OTIC solution Place 3 drops into both ears 2 (two) times daily as needed. As needed to ear canals after getting ears wet to prevent infection.  . butalbital-acetaminophen-caffeine (FIORICET) 50-325-40 MG tablet Take 1 tablet up to 4 x /day if needed for Severe Headache  . cetirizine-pseudoephedrine (ZYRTEC-D) 5-120 MG tablet  Take 1 tablet by mouth 2 (two) times daily. (Patient taking differently: Take 1 tablet by mouth as needed. Takes one tablet daily)  . cholecalciferol (VITAMIN D3) 25 MCG (1000 UT) tablet Take 1,000 Units by mouth daily.  Marland Kitchen levothyroxine (SYNTHROID, LEVOTHROID) 50 MCG tablet TAKE 1 AND ONE-HALF TABLETS BY MOUTH DAILY.  Pt states alternating 38mcg and 59mcg every other day.  . naproxen sodium (ANAPROX) 220 MG tablet Take 440 mg by mouth 2 (two) times daily as needed (pain).  . rosuvastatin (CRESTOR) 40 MG tablet Take 1/2 to 1 tablet daily or as directed for Cholesterol (Patient not taking: Reported on 05/13/2019)   No current facility-administered medications on file prior to visit.    Allergies  Allergen Reactions  . Doxycycline Itching    itching  . Penicillins Hives  . Pravastatin     Myalgia  . Sulfa Antibiotics Hives  . Zetia [Ezetimibe]     Myalgia   PMHx:   Past Medical History:  Diagnosis Date  . Blind right eye    legally  . 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 High cholesterol   . Hypertension   . Hypothyroidism   . Migraine   . Prediabetes   . Sarcoidosis, lung ([place]) 15 YRS AGO   NO TREATMENTS  . 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 Vertigo   .  Vitamin D deficiency    Immunization History  Administered Date(s) Administered  . Influenza Split 06/30/2013  . Influenza, High Dose Seasonal PF 06/26/2015, 07/25/2016, 05/26/2017, 06/30/2018  . Pneumococcal Conjugate-13 09/14/2014  . Pneumococcal Polysaccharide-23 10/16/2011, 02/04/2017  . Td 10/16/2011  . Zoster 01/14/2007   Past Surgical History:  Procedure Laterality Date  . BACK SURGERY  1970  . CATARACT EXTRACTION Left 12/2017  . COLONOSCOPY  2006; 03/28/11   2 small adenomas; normal  . EYE SURGERY  1964   right eye  . KNEE ARTHROSCOPY  2002  . NASAL SEPTUM SURGERY  1998  . TOTAL ABDOMINAL HYSTERECTOMY  2000   FHx:    Reviewed / unchanged  SHx:    Reviewed / unchanged   Systems  Review:  Constitutional: Denies fever, chills, wt changes, headaches, insomnia, fatigue, night sweats, change in appetite. Eyes: Denies redness, blurred vision, diplopia, discharge, itchy, watery eyes.  ENT: Denies discharge, congestion, post nasal drip, epistaxis, sore throat, earache, hearing loss, dental pain, tinnitus, vertigo, sinus pain, snoring.  CV: Denies chest pain, palpitations, irregular heartbeat, syncope, dyspnea, diaphoresis, orthopnea, PND, claudication or edema. Respiratory: denies cough, dyspnea, DOE, pleurisy, hoarseness, laryngitis, wheezing.  Gastrointestinal: Denies dysphagia, odynophagia, heartburn, reflux, water brash, abdominal pain or cramps, nausea, vomiting, bloating, diarrhea, constipation, hematemesis, melena, hematochezia  or hemorrhoids. Genitourinary: Denies dysuria, frequency, urgency, nocturia, hesitancy, discharge, hematuria or flank pain. Musculoskeletal: Denies arthralgias, myalgias, stiffness, jt. swelling, pain, limping or strain/sprain.  Skin: Denies pruritus, rash, hives, warts, acne, eczema or change in skin lesion(s). Neuro: No weakness, tremor, incoordination, spasms, paresthesia or pain. Psychiatric: Denies confusion, memory loss or sensory loss. Endo: Denies change in weight, skin or hair change.  Heme/Lymph: No excessive bleeding, bruising or enlarged lymph nodes.  Physical Exam  BP 116/74   Pulse 76   Temp 97.9 F (36.6 C)   Resp 16   Ht 5\' 6"  (1.676 m)   Wt 168 lb 6.4 oz (76.4 kg)   BMI 27.18 kg/m   Appears  well nourished, well groomed  and in no distress.  Eyes: PERRLA, EOMs, conjunctiva no swelling or erythema. Sinuses: No frontal/maxillary tenderness ENT/Mouth: EAC's clear, TM's nl w/o erythema, bulging. Nares clear w/o erythema, swelling, exudates. Oropharynx clear without erythema or exudates. Oral hygiene is good. Tongue normal, non obstructing. Hearing intact.  Neck: Supple. Thyroid not palpable. Car 2+/2+ without bruits,  nodes or JVD. Chest: Respirations nl with BS clear & equal w/o rales, rhonchi, wheezing or stridor.  Cor: Heart sounds normal w/ regular rate and rhythm without sig. murmurs, gallops, clicks or rubs. Peripheral pulses normal and equal  without edema.  Abdomen: Soft & bowel sounds normal. Non-tender w/o guarding, rebound, hernias, masses or organomegaly.  Lymphatics: Unremarkable.  Musculoskeletal: Full ROM all peripheral extremities, joint stability, 5/5 strength and normal gait.  Skin: Warm, dry without exposed rashes, lesions or ecchymosis apparent.  Neuro: Cranial nerves intact, reflexes equal bilaterally. Sensory-motor testing grossly intact. Tendon reflexes grossly intact.  Pysch: Alert & oriented x 3.  Insight and judgement nl & appropriate. No ideations.  Assessment and Plan:  1. Labile hypertension  - Continue medication, monitor blood pressure at home.  - Continue DASH diet.  Reminder to go to the ER if any CP,  SOB, nausea, dizziness, severe HA, changes vision/speech.  - CBC with Differential/Platelet - COMPLETE METABOLIC PANEL WITH GFR - Magnesium  2. Hyperlipidemia, mixed  - Continue diet/meds, exercise,& lifestyle modifications.  - Continue monitor periodic cholesterol/liver & renal functions   -  Lipid panel  3. Abnormal glucose  - Continue diet, exercise  - Lifestyle modifications.  - Monitor appropriate labs.  - Hemoglobin A1c - Insulin, random  4. Vitamin D deficiency  - Continue supplementation.  5. Fear of side effects of medication  6. Postablative hypothyroidism  7. Medication management  - CBC with Differential/Platelet - COMPLETE METABOLIC PANEL WITH GFR - Magnesium - Lipid panel - Hemoglobin A1c - Insulin, random     Discussed  regular exercise, BP monitoring, weight control to achieve/maintain BMI less than 25 and discussed med and SE's. Recommended labs to assess and monitor clinical status with further disposition pending results of  labs.  I discussed the assessment and treatment plan with the patient. The patient was provided an opportunity to ask questions and all were answered. The patient agreed with the plan and demonstrated an understanding of the instructions.  I provided over 30 minutes of exam, counseling, chart review and  complex critical decision making.  Kirtland Bouchard, MD

## 2019-05-13 NOTE — Patient Instructions (Signed)

## 2019-05-14 LAB — COMPLETE METABOLIC PANEL WITH GFR
AG Ratio: 2 (calc) (ref 1.0–2.5)
ALT: 10 U/L (ref 6–29)
AST: 19 U/L (ref 10–35)
Albumin: 4.3 g/dL (ref 3.6–5.1)
Alkaline phosphatase (APISO): 90 U/L (ref 37–153)
BUN: 10 mg/dL (ref 7–25)
CO2: 28 mmol/L (ref 20–32)
Calcium: 9.6 mg/dL (ref 8.6–10.4)
Chloride: 107 mmol/L (ref 98–110)
Creat: 0.77 mg/dL (ref 0.60–0.93)
GFR, Est African American: 89 mL/min/{1.73_m2} (ref >=60)
GFR, Est Non African American: 77 mL/min/{1.73_m2} (ref >=60)
Globulin: 2.2 g/dL (calc) (ref 1.9–3.7)
Glucose, Bld: 91 mg/dL (ref 65–99)
Potassium: 4 mmol/L (ref 3.5–5.3)
Sodium: 142 mmol/L (ref 135–146)
Total Bilirubin: 0.4 mg/dL (ref 0.2–1.2)
Total Protein: 6.5 g/dL (ref 6.1–8.1)

## 2019-05-14 LAB — CBC WITH DIFFERENTIAL/PLATELET
Absolute Monocytes: 450 cells/uL (ref 200–950)
Basophils Absolute: 50 cells/uL (ref 0–200)
Basophils Relative: 1 %
Eosinophils Absolute: 150 cells/uL (ref 15–500)
Eosinophils Relative: 3 %
HCT: 36.3 % (ref 35.0–45.0)
Hemoglobin: 11.7 g/dL (ref 11.7–15.5)
Lymphs Abs: 1870 cells/uL (ref 850–3900)
MCH: 27.3 pg (ref 27.0–33.0)
MCHC: 32.2 g/dL (ref 32.0–36.0)
MCV: 84.6 fL (ref 80.0–100.0)
MPV: 9.6 fL (ref 7.5–12.5)
Monocytes Relative: 9 %
Neutro Abs: 2480 cells/uL (ref 1500–7800)
Neutrophils Relative %: 49.6 %
Platelets: 354 10*3/uL (ref 140–400)
RBC: 4.29 10*6/uL (ref 3.80–5.10)
RDW: 13.3 % (ref 11.0–15.0)
Total Lymphocyte: 37.4 %
WBC: 5 10*3/uL (ref 3.8–10.8)

## 2019-05-14 LAB — HEMOGLOBIN A1C
Hgb A1c MFr Bld: 5.3 % of total Hgb (ref <5.7)
Mean Plasma Glucose: 105 (calc)
eAG (mmol/L): 5.8 (calc)

## 2019-05-14 LAB — LIPID PANEL
Cholesterol: 269 mg/dL — ABNORMAL HIGH (ref <200)
HDL: 99 mg/dL (ref >=50)
LDL Cholesterol (Calc): 155 mg/dL (calc) — ABNORMAL HIGH
Non-HDL Cholesterol (Calc): 170 mg/dL (calc) — ABNORMAL HIGH (ref <130)
Total CHOL/HDL Ratio: 2.7 (calc) (ref <5.0)
Triglycerides: 57 mg/dL (ref <150)

## 2019-05-14 LAB — MAGNESIUM: Magnesium: 2.3 mg/dL (ref 1.5–2.5)

## 2019-05-14 LAB — INSULIN, RANDOM: Insulin: 2.2 u[IU]/mL

## 2019-05-19 NOTE — Telephone Encounter (Signed)
Spoke with patient about virtual 05/24/19 visit with MD for lipid clinic. She expressed that she does not want her first appointment with MD to be a virtual visit and only wants an in-office appointment. She states she has been waiting for almost a year for an appointment and states "I guess he just does not want to see me". Explained to patient that clinicians do not have control over which days are in-office and which are virtual and changes have been made d/t COVID19. Apologized that she was r/s for this visit on a virtual day, when she states she had previously expressed she only wanted to be seen in-office.   Patient has been r/s for 1st available in-office visit on a day convenient for her (first available was 11/3) but she was scheduled on 11/4 due to election day. Notified her I will contact her if there is an in-office lipid clinic opening sooner.

## 2019-05-24 ENCOUNTER — Ambulatory Visit: Payer: Medicare Other | Admitting: Internal Medicine

## 2019-05-26 ENCOUNTER — Ambulatory Visit (INDEPENDENT_AMBULATORY_CARE_PROVIDER_SITE_OTHER): Payer: Medicare Other | Admitting: Internal Medicine

## 2019-05-26 ENCOUNTER — Other Ambulatory Visit: Payer: Self-pay

## 2019-05-26 ENCOUNTER — Encounter: Payer: Self-pay | Admitting: Internal Medicine

## 2019-05-26 VITALS — BP 120/80 | HR 83 | Temp 97.2°F | Ht 66.0 in | Wt 168.6 lb

## 2019-05-26 DIAGNOSIS — Z789 Other specified health status: Secondary | ICD-10-CM

## 2019-05-26 DIAGNOSIS — E782 Mixed hyperlipidemia: Secondary | ICD-10-CM | POA: Diagnosis not present

## 2019-05-26 NOTE — Progress Notes (Signed)
LIPID CLINIC CONSULT NOTE  Chief Complaint:  Manage dyslipidemia  Primary Care Physician: [name], MD  Primary Cardiologist:  No primary care provider on file.  HPI:  Laura Maynard is a 73 y.o. female who is being seen today for the evaluation of dyslipidemia at the request of [name], MD. This is a pleasant 74 year old female who is kindly referred by Dr. Melford Aase for evaluation of dyslipidemia.  According to her she has had a longstanding history of dyslipidemia and is been on multiple therapies including the statins (pravastatin, atorvastatin (and ezetimibe, for which she has had significant side effects including myalgias.  Recently she had a lipid profile which showed total cholesterol 269, triglycerides 57, HDL 99 and LDL 155.  I reviewed her chart and noted that she has no history of coronary disease and few cardiovascular risk factors other than age.  She has no history of hypertension, diabetes and no significant premature coronary disease in her family.  She does have hypothyroidism on low-dose levothyroxine.  Chart review indicated she had a CT angios in 2014 which showed no evidence of pulmonary embolism or pulmonary sarcoidosis.  Also there was no mention of coronary calcification.  PMHx:  Past Medical History:  Diagnosis Date  . Blind right eye    legally  . 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 High cholesterol   . Hypertension   . Hypothyroidism   . Migraine   . Prediabetes   . Sarcoidosis, lung ([place]) 15 YRS AGO   NO TREATMENTS  . 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 Vertigo   . Vitamin D deficiency     Past Surgical History:  Procedure Laterality Date  . BACK SURGERY  1970  . CATARACT EXTRACTION Left 12/2017  . COLONOSCOPY  2006; 03/28/11   2 small adenomas; normal  . EYE SURGERY  1964   right eye  . KNEE ARTHROSCOPY  2002  . NASAL SEPTUM SURGERY  1998  . TOTAL ABDOMINAL HYSTERECTOMY  2000    FAMHx:  Family History  Problem  Relation Age of Onset  . Lung cancer Sister 58  . Throat cancer Brother 84  . Stroke Mother   . Emphysema Father   . COPD Sister   . Diabetes Brother   . Colon cancer Neg Hx     SOCHx:   reports that she has never smoked. She has never used smokeless tobacco. She reports that she does not drink alcohol or use drugs.  ALLERGIES:  Allergies  Allergen Reactions  . Doxycycline Itching    itching  . Penicillins Hives  . Pravastatin     Myalgia  . Sulfa Antibiotics Hives  . Zetia [Ezetimibe]     Myalgia    ROS: Pertinent items noted in HPI and remainder of comprehensive ROS otherwise negative.  HOME MEDS: Current Outpatient Medications on File Prior to Visit  Medication Sig Dispense Refill  . Acetaminophen (TYLENOL) 325 MG CAPS as needed.     Marland Kitchen acetic acid-hydrocortisone (VOSOL-HC) OTIC solution Place 3 drops into both ears 2 (two) times daily as needed. As needed to ear canals after getting ears wet to prevent infection. 10 mL 1  . butalbital-acetaminophen-caffeine (FIORICET) 50-325-40 MG tablet Take 1 tablet up to 4 x /day if needed for Severe Headache 20 tablet 0  . cetirizine-pseudoephedrine (ZYRTEC-D) 5-120 MG tablet Take 1 tablet by mouth 2 (two) times daily. (Patient taking differently: Take 1 tablet by mouth as needed. Takes one tablet daily) 60 tablet 2  .  cholecalciferol (VITAMIN D3) 25 MCG (1000 UT) tablet Take 1,000 Units by mouth daily.    Marland Kitchen levothyroxine (SYNTHROID, LEVOTHROID) 50 MCG tablet TAKE 1 AND ONE-HALF TABLETS BY MOUTH DAILY.  Pt states alternating 68mcg and 110mcg every other day. 150 tablet 3  . naproxen sodium (ANAPROX) 220 MG tablet Take 440 mg by mouth 2 (two) times daily as needed (pain).     No current facility-administered medications on file prior to visit.     LABS/IMAGING: No results found for this or any previous visit (from the past 48 hour(s)). No results found.  LIPID PANEL:    Component Value Date/Time   CHOL 269 (H) 05/13/2019 0926    TRIG 57 05/13/2019 0926   HDL 99 05/13/2019 0926   CHOLHDL 2.7 05/13/2019 0926   VLDL 13 02/04/2017 1550   LDLCALC 155 (H) 05/13/2019 0926    WEIGHTS: Wt Readings from Last 3 Encounters:  05/26/19 168 lb 9.6 oz (76.5 kg)  05/13/19 168 lb 6.4 oz (76.4 kg)  04/28/19 168 lb (76.2 kg)    VITALS: BP 120/80   Pulse 83   Temp (!) 97.2 F (36.2 C)   Ht 5\' 6"  (1.676 m)   Wt 168 lb 9.6 oz (76.5 kg)   SpO2 99%   BMI 27.21 kg/m   EXAM: General appearance: alert and no distress Neck: no carotid bruit, no JVD and thyroid not enlarged, symmetric, no tenderness/mass/nodules Lungs: clear to auscultation bilaterally Heart: regular rate and rhythm, S1, S2 normal, no murmur, click, rub or gallop Abdomen: soft, non-tender; bowel sounds normal; no masses,  no organomegaly Extremities: extremities normal, atraumatic, no cyanosis or edema Pulses: 2+ and symmetric Skin: Skin color, texture, turgor normal. No rashes or lesions Neurologic: Grossly normal Psych: Pleasant  EKG: Deferred  ASSESSMENT: 1. Mixed dyslipidemia 2. Statin intolerance  PLAN: 1.   [name] is noted to have a mixed dyslipidemia with an elevated LDL cholesterol but a very high HDL cholesterol.  If she has functional HDL, this may effectively offset her elevated LDL and explain why she has had no significant premature onset coronary disease.  I think it is reasonable to reassess her cardiovascular risk further since she is low to intermediate risk based on traditional risk factors.  I recommend coronary artery calcium scoring.  If her score is 0 or low, then I would not necessarily recommend statin therapy at this time.  In addition we did discuss her diet at length today and provide some dietary recommendations.  She eats somewhat of an unusual diet which is not balanced, but generally it tends to be more vegetarian.  We will follow-up after her calcium score and provide more recommendations at that time.  Thanks again for  the kind referral.  Pixie Casino, MD, FACC, [place] Director of the [facility] Diplomate of the American Board of Clinical Lipidology Attending Cardiologist  Direct Dial: [phone]  Fax: [phone]  Website:  [email or web address] 05/26/2019, 12:59 PM

## 2019-05-26 NOTE — Patient Instructions (Signed)
Medication Instructions:  NO CHANGES If you need a refill on your cardiac medications before your next appointment, please call your pharmacy.   Lab work: FASTING lab work in 6 months (prior to next visit with Dr. Debara Pickett) If you have labs (blood work) drawn today and your tests are completely normal, you will receive your results only by: Marland Kitchen MyChart Message (if you have MyChart) OR . A paper copy in the mail If you have any lab test that is abnormal or we need to change your treatment, we will call you to review the results.  Testing/Procedures: Dr. Debara Pickett has ordered a CT coronary calcium score. This test is done at [address]. This is $150 out of pocket.   Coronary CalciumScan A coronary calcium scan is an imaging test used to look for deposits of calcium and other fatty materials (plaques) in the inner lining of the blood vessels of the heart (coronary arteries). These deposits of calcium and plaques can partly clog and narrow the coronary arteries without producing any symptoms or warning signs. This puts a person at risk for a heart attack. This test can detect these deposits before symptoms develop. Tell a health care provider about:  Any allergies you have.  All medicines you are taking, including vitamins, herbs, eye drops, creams, and over-the-counter medicines.  Any problems you or family members have had with anesthetic medicines.  Any blood disorders you have.  Any surgeries you have had.  Any medical conditions you have.  Whether you are pregnant or may be pregnant. What are the risks? Generally, this is a safe procedure. However, problems may occur, including:  Harm to a pregnant woman and her unborn baby. This test involves the use of radiation. Radiation exposure can be dangerous to a pregnant woman and her unborn baby. If you are pregnant, you generally should not have this procedure done.  Slight increase in the risk of cancer. This is because  of the radiation involved in the test. What happens before the procedure? No preparation is needed for this procedure. What happens during the procedure?  You will undress and remove any jewelry around your neck or chest.  You will put on a hospital gown.  Sticky electrodes will be placed on your chest. The electrodes will be connected to an electrocardiogram (ECG) machine to record a tracing of the electrical activity of your heart.  A CT scanner will take pictures of your heart. During this time, you will be asked to lie still and hold your breath for 2-3 seconds while a picture of your heart is being taken. The procedure may vary among health care providers and hospitals. What happens after the procedure?  You can get dressed.  You can return to your normal activities.  It is up to you to get the results of your test. Ask your health care provider, or the department that is doing the test, when your results will be ready. Summary  A coronary calcium scan is an imaging test used to look for deposits of calcium and other fatty materials (plaques) in the inner lining of the blood vessels of the heart (coronary arteries).  Generally, this is a safe procedure. Tell your health care provider if you are pregnant or may be pregnant.  No preparation is needed for this procedure.  A CT scanner will take pictures of your heart.  You can return to your normal activities after the scan is done. This information is not intended  to replace advice given to you by your health care provider. Make sure you discuss any questions you have with your health care provider. Document Released: 03/14/2008 Document Revised: 08/05/2016 Document Reviewed: 08/05/2016 Elsevier Interactive Patient Education  2017 Elsevier Inc.    Follow-Up: Dr. Debara Pickett recommends that you schedule a follow up visit with him the in the [place] [facility] in 6 months. Please have fasting blood work about 1 week prior to this visit  and he will review the blood work results with you at your appointment.

## 2019-05-27 DIAGNOSIS — M170 Bilateral primary osteoarthritis of knee: Secondary | ICD-10-CM | POA: Insufficient documentation

## 2019-06-10 ENCOUNTER — Other Ambulatory Visit: Payer: Self-pay

## 2019-06-10 ENCOUNTER — Ambulatory Visit: Payer: Medicare Other

## 2019-06-10 VITALS — Temp 97.4°F

## 2019-06-10 DIAGNOSIS — Z23 Encounter for immunization: Secondary | ICD-10-CM

## 2019-06-10 NOTE — Progress Notes (Signed)
Patient presents to the office for HD Flu Vaccine. Vaccine administered to Left Deltoid withoutanycomplication. Temperature taken and recorded 

## 2019-06-22 ENCOUNTER — Ambulatory Visit (INDEPENDENT_AMBULATORY_CARE_PROVIDER_SITE_OTHER)
Admission: RE | Admit: 2019-06-22 | Discharge: 2019-06-22 | Disposition: A | Discharge status: Home / Self Care | Payer: Medicare Other | Source: Ambulatory Visit | Attending: Internal Medicine | Admitting: Internal Medicine

## 2019-06-22 ENCOUNTER — Other Ambulatory Visit: Payer: Self-pay

## 2019-06-22 DIAGNOSIS — E782 Mixed hyperlipidemia: Secondary | ICD-10-CM

## 2019-06-23 ENCOUNTER — Inpatient Hospital Stay: Admission: RE | Admit: 2019-06-23 | Payer: Medicare Other | Source: Ambulatory Visit

## 2019-06-25 ENCOUNTER — Encounter: Payer: Self-pay | Admitting: Internal Medicine

## 2019-06-25 ENCOUNTER — Telehealth: Payer: Self-pay | Admitting: Internal Medicine

## 2019-06-25 ENCOUNTER — Telehealth: Payer: Self-pay

## 2019-06-25 DIAGNOSIS — E782 Mixed hyperlipidemia: Secondary | ICD-10-CM

## 2019-06-25 DIAGNOSIS — Z789 Other specified health status: Secondary | ICD-10-CM

## 2019-06-25 MED ORDER — NEXLETOL 180 MG PO TABS: 1.0000 | ORAL_TABLET | Freq: Every day | ORAL | 11 refills | Status: DC

## 2019-06-25 NOTE — Telephone Encounter (Signed)
Patient called w/results. Agrees w/MD plan. Copy of results/MD interpretation & recommendations mailed to patient with a lab order, per her request. She will call to follow up in lipid clinic.

## 2019-06-25 NOTE — Telephone Encounter (Signed)
Prior Auth started for Nexletol 180mg 

## 2019-06-25 NOTE — Telephone Encounter (Signed)
-----   Message from Pixie Casino, MD sent at 06/24/2019 11:02 AM EDT ----- I would let her know that there is evidence of CAD, but not extensive. Nonetheless, this means that even though her HDL is high, the elevated LDL should be treated. Since she has been statin intolerant, would recommend we try Nexletol 180 mg daily. Repeat lipid in 3 months.  Dr. Lemmie Evens

## 2019-06-28 NOTE — Telephone Encounter (Signed)
Nexletol is denied for not meeting the following prior authorization requirement(s). Medication authorization requires the following: (1) you have one of the following low-density lipoprotein cholesterol (LDL-C) values while on maximally tolerated statin therapy within the last 120 days: (A) LDL-C greater than or equal to 70 mg/dL with atherosclerotic cardiovascular disease; OR (B) LDL-C greater than or equal to 100 mg/dL without atherosclerotic cardiovascular disease; AND (2) one of the following: (A) You have been receiving at least 12 weeks of generic ezetimibe therapy as adjunct to maximally tolerated statin therapy; OR (B) you have a history of contraindication or intolerance to ezetimibe. Reviewed by: CPO, Pharm.D.  Appeals: [employer] Appeals and Grievance Department [address] [place] [address] Fax: [phone]

## 2019-06-30 ENCOUNTER — Other Ambulatory Visit: Payer: Self-pay | Admitting: Internal Medicine

## 2019-06-30 MED ORDER — BUTALBITAL-APAP-CAFFEINE 50-325-40 MG PO TABS: ORAL_TABLET | ORAL | 0 refills | Status: DC

## 2019-07-05 NOTE — Telephone Encounter (Signed)
Please submit appeal - according to denial, she has 1B) - LDL>100 (155) without ASCVD and 2B) history of intolerance to ezetimibe.  This should be approved.  Dr Lemmie Evens

## 2019-07-08 MED ORDER — NEXLETOL 180 MG PO TABS: 1.0000 | ORAL_TABLET | Freq: Every day | ORAL | 11 refills | Status: DC

## 2019-07-08 NOTE — Addendum Note (Signed)
Addended by: Fidel Levy on: 07/08/2019 04:02 PM   Modules accepted: Orders

## 2019-07-08 NOTE — Telephone Encounter (Signed)
Patient's insurance has approved coverage of Nexletol after appeal submission from 06/25/2019 - 01/06/2020

## 2019-07-08 NOTE — Telephone Encounter (Signed)
Appeal letter composed & faxed to [phone] with MD note, CT calcium score report, denial letter from [employer]

## 2019-07-08 NOTE — Telephone Encounter (Signed)
YAY

## 2019-07-09 NOTE — Telephone Encounter (Signed)
Patient aware med is approved. She will get co-pay amount from pharmacy. If not affordable, she was provided info on [employer]wellfoundation.org co-pay assistance program.

## 2019-07-22 LAB — HM MAMMOGRAPHY

## 2019-07-26 ENCOUNTER — Encounter: Payer: Self-pay | Admitting: Internal Medicine

## 2019-08-04 ENCOUNTER — Ambulatory Visit: Payer: Medicare Other | Admitting: Internal Medicine

## 2019-08-11 ENCOUNTER — Telehealth: Payer: Self-pay | Admitting: Internal Medicine

## 2019-08-11 NOTE — Telephone Encounter (Signed)
New Message     Pt is wondering if she needs to come before her app for her Labs or if she can come the same day    Pt c/o medication issue:  1. Name of Medication: Nexletol 180 mg   2. How are you currently taking this medication (dosage and times per day)? Not taking   3. Are you having a reaction (difficulty breathing--STAT)? No   4. What is your medication issue? Pt has not started the medication she says the cost is too much

## 2019-08-11 NOTE — Telephone Encounter (Signed)
Spoke with pt who states that Dr. Debara Pickett prescribed Nexletol 180 mg for her and she has not yet taken it d/t cost. She wanted to know if she should still come in for lab work and her f/u appt in February 2021 since she has not taken the Nexletol. Pt also wanted results of her calcium score. Reviewed the following calcium score result note with pt:  Result Notes for CT CARDIAC SCORING  Notes recorded by Fidel Levy, RN on 06/25/2019 at 10:57 AM EDT  Patient aware of results. See phone note  ------   Notes recorded by Pixie Casino, MD on 06/24/2019 at 11:02 AM EDT  I would let her know that there is evidence of CAD, but not extensive. Nonetheless, this means that even though her HDL is high, the elevated LDL should be treated. Since she has been statin intolerant, would recommend we try Nexletol 180 mg daily. Repeat lipid in 3 months.     Reviewed 9/25 telephone encounter and inquired if pt had completed [email or web address] co-pay assistance program online. Pt states she does not have access to Internet and attempted to complete co-pay assistance program from over the phone but felt uncomfortable giving out the information requested over the phone. She states she told the woman she was speaking with to contact [facility] office to get the info requested. Pt also states she happened to read the orange sheet attached to her lab slip and noticed that it advised her not to eat or drink after midnight. Pt states she has never not eaten before labs and has always had breakfast. Pt questioned if this could affect the results of her lipid lab work. Informed pt that restricting pt diet after midnight for labs such as lipids helps to avoid inaccurate results. Pt would like to know if lab work should be repeated sooner than February 2021. Informed pt that triage nurse would route encounter to Dr. Debara Pickett nurse Sheral Apley, RN who is with Dr. Debara Pickett today and she will f/u with her on plan after consulting Dr.  Debara Pickett

## 2019-08-12 NOTE — Telephone Encounter (Signed)
Patient reports she cannot afford Nexletol @ $68/month  She would like to know if she could half the tablets or take every other day, she can afford the make the medication last 2 months instead of 1.   She did not complete application for [employer] co-pay assistance as she did not want to give out SS# or income info over the phone and has no way to complete application online.   Will route to MD to advise on medication question

## 2019-08-13 NOTE — Telephone Encounter (Signed)
Patient aware of MD notes below. Med list updated.

## 2019-08-13 NOTE — Telephone Encounter (Signed)
There is no data on 1/2 dose nexletol as to effectiveness. I guess she could try to take them every other day. Not optimal, but would stretch it out some. Maybe the cost will go down as of January.  Dr Lemmie Evens

## 2019-09-29 ENCOUNTER — Other Ambulatory Visit: Payer: Self-pay | Admitting: Internal Medicine

## 2019-09-29 MED ORDER — BUTALBITAL-APAP-CAFFEINE 50-325-40 MG PO TABS: ORAL_TABLET | ORAL | 0 refills | Status: DC

## 2019-10-14 ENCOUNTER — Encounter: Payer: Self-pay | Admitting: Internal Medicine

## 2019-10-22 ENCOUNTER — Other Ambulatory Visit: Payer: Self-pay

## 2019-10-26 ENCOUNTER — Other Ambulatory Visit: Payer: Self-pay

## 2019-10-26 ENCOUNTER — Encounter: Payer: Self-pay | Admitting: Internal Medicine

## 2019-10-26 ENCOUNTER — Ambulatory Visit: Payer: Medicare Other | Admitting: Internal Medicine

## 2019-10-26 VITALS — BP 138/70 | HR 110 | Ht 65.25 in | Wt 168.0 lb

## 2019-10-26 DIAGNOSIS — E042 Nontoxic multinodular goiter: Secondary | ICD-10-CM

## 2019-10-26 DIAGNOSIS — E890 Postprocedural hypothyroidism: Secondary | ICD-10-CM

## 2019-10-26 DIAGNOSIS — E559 Vitamin D deficiency, unspecified: Secondary | ICD-10-CM

## 2019-10-26 LAB — T4, FREE: Free T4: 0.96 ng/dL (ref 0.60–1.60)

## 2019-10-26 LAB — VITAMIN D 25 HYDROXY (VIT D DEFICIENCY, FRACTURES): VITD: 27.83 ng/mL — ABNORMAL LOW (ref 30.00–100.00)

## 2019-10-26 LAB — TSH: TSH: 3.41 u[IU]/mL (ref 0.35–4.50)

## 2019-10-26 NOTE — Patient Instructions (Signed)
Please stop at the lab.  Continue vit D 1000 units daily.  Continue Levothyroxine 75 alternating with 50 mcg every other day.  Take the thyroid hormone every day, with water, at least 30 minutes before breakfast, separated by at least 4 hours from: - acid reflux medications - calcium - iron - multivitamins  Please come back for a follow-up appointment in 6 months.

## 2019-10-26 NOTE — Progress Notes (Signed)
Patient ID: Laura Maynard, female   DOB: [birth year], 74 y.o.   MRN: [id number]  This visit occurred during the SARS-CoV-2 public health emergency.  Safety protocols were in place, including screening questions prior to the visit, additional usage of staff PPE, and extensive cleaning of exam room while observing appropriate contact time as indicated for disinfecting solutions.   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 Last visit 6 months ago.  Reviewed and addended history: Pt had screening labs in 01/2014 and was found to have a low TSH. This was repeated 2x and the TSH continued to decrease.   Thyroid Uptake and scan (05/20/2014) >> uptake 48.5% and scan c/w TMNG.  We started MMI 5 mg bid.  Had RAI tx on 06/09/2014.   After RAI treatment, she developed post-ablative hypothyroidism >> we started levothyroxine 50 g daily. She developed leg cramps, which resolved after stopping levothyroxine but her TSH started to increase, so we change to Tirosint. Unfortunately, she could not afford this and we had to restart levothyroxine at the lower dose, subsequent to be increased back to 50 g daily (on 08/12/2016).  Her TSH became suppressed after increased the dose to 75 mcg daily.    She had itching with a 75 mcg tablet so we switched to only 50 mcg tablets, which are white.  She is tolerating these well.    Pt continues on levothyroxine 50 alternating with 75 mcg every other day: - in am - fasting - + coffee + sweet and low - at least 1h from b'fast - no Ca, Fe, MVI, PPIs - not on Biotin  Reviewed her TFTs: Lab Results  Component Value Date   TSH 3.11 04/22/2019   TSH 2.79 10/29/2018   TSH 4.71 (H) 09/17/2018   TSH 1.93 06/15/2018   TSH 2.69 02/05/2018   TSH 0.75 10/14/2017   TSH 0.29 (L) 08/28/2017   TSH 0.16 (L) 06/23/2017   TSH 5.51 (H) 04/28/2017   TSH  3.96 02/04/2017   FREET4 0.89 04/22/2019   FREET4 0.95 10/29/2018   FREET4 0.95 10/14/2017   FREET4 1.14 08/28/2017   FREET4 1.11 06/23/2017   FREET4 0.91 04/28/2017   FREET4 0.87 10/02/2016   FREET4 0.65 08/12/2016   FREET4 0.44 (L) 06/26/2016   FREET4 0.79 02/20/2016    Pt denies: - feeling nodules in neck - hoarseness - dysphagia - choking - SOB with lying down  She also has a history of HTN, HL, anemia.  Vitamin D deficiency -severe: Lab Results  Component Value Date   VD25OH 24.26 (L) 04/22/2019   VD25OH 6 (L) 09/17/2018   VD25OH 12 (L) 02/05/2018   VD25OH 16.80 (L) 06/23/2017   VD25OH 23.88 (L) 04/28/2017   VD25OH 20 (L) 02/04/2017   VD25OH 8 (L) 11/05/2016   VD25OH 12 (L) 01/16/2016   VD25OH 13 (L) 07/27/2015   VD25OH 15 (L) 04/19/2015   She had very low vitamin D levels in the past; she was previously on 5000 units vitamin D daily but was not compliant with this due to headaches and itching.  At last visit she was on 1000 units daily and I advised her to increase the dose to 2000 units daily since vitamin D was still low, she was concerned about possible constipation so she continues on 1000 units daily.  She had steroid inj's in knees.  ROS: Constitutional: no weight gain/no weight loss,  no fatigue, no subjective hyperthermia, no subjective hypothermia Eyes: no blurry vision, no xerophthalmia ENT: no sore throat, + see HPI Cardiovascular: no CP/no SOB/no palpitations/no leg swelling Respiratory: no cough/no SOB/no wheezing Gastrointestinal: no N/no V/no D/no C/no acid reflux Musculoskeletal: + muscle aches/+ joint aches Skin: no rashes, + hair loss Neurological: no tremors/no numbness/no tingling/no dizziness  I reviewed pt's medications, allergies, PMH, social hx, family hx, and changes were documented in the history of present illness. Otherwise, unchanged from my initial visit note.  Past Medical History:  Diagnosis Date  . Blind right eye    legally   . 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 High cholesterol   . Hypertension   . Hypothyroidism   . Migraine   . Prediabetes   . Sarcoidosis, lung ([place]) 15 YRS AGO   NO TREATMENTS  . 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 Vertigo   . Vitamin D deficiency    Past Surgical History:  Procedure Laterality Date  . BACK SURGERY  1970  . CATARACT EXTRACTION Left 12/2017  . COLONOSCOPY  2006; 03/28/11   2 small adenomas; normal  . EYE SURGERY  1964   right eye  . KNEE ARTHROSCOPY  2002  . NASAL SEPTUM SURGERY  1998  . TOTAL ABDOMINAL HYSTERECTOMY  2000   Social History   Socioeconomic History  . Marital status: Divorced    Spouse name: Not on file  . Number of children: 0  . Years of education: college  . Highest education level: Not on file  Occupational History    Comment: Retired  Tobacco Use  . Smoking status: Never Smoker  . Smokeless tobacco: Never Used  Substance and Sexual Activity  . Alcohol use: Never    Alcohol/week: 1.0 standard drinks    Types: 1 Standard drinks or equivalent per week    Comment: RARE  . Drug use: Never  . Sexual activity: Not on file  Other Topics Concern  . Not on file  Social History Narrative   Patient is retired and lives at home alone. Patient  has college education.   Caffeine- 3 cups of caffeine daily.  One soda daily.   Right handed.   Social Determinants of Health   Financial Resource Strain:   . Difficulty of Paying Living Expenses: Not on file  Food Insecurity:   . Worried About Charity fundraiser in the Last Year: Not on file  . Ran Out of Food in the Last Year: Not on file  Transportation Needs:   . Lack of Transportation (Medical): Not on file  . Lack of Transportation (Non-Medical): Not on file  Physical Activity:   . Days of Exercise per Week: Not on file  . Minutes of Exercise per Session: Not on file  Stress:   . Feeling of Stress : Not on file  Social Connections:   . Frequency of Communication with Friends  and Family: Not on file  . Frequency of Social Gatherings with Friends and Family: Not on file  . Attends Religious Services: Not on file  . Active Member of Clubs or Organizations: Not on file  . Attends Archivist Meetings: Not on file  . Marital Status: Not on file  Intimate Partner Violence:   . Fear of Current or Ex-Partner: Not on file  . Emotionally Abused: Not on file  . Physically Abused: Not on file  . Sexually Abused: Not on file   Current Outpatient Medications on File Prior to Visit  Medication Sig Dispense Refill  . Acetaminophen (TYLENOL) 325 MG CAPS as needed.     Marland Kitchen acetic acid-hydrocortisone (VOSOL-HC) OTIC solution Place 3 drops into both ears 2 (two) times daily as needed. As needed to ear canals after getting ears wet to prevent infection. 10 mL 1  . Bempedoic Acid (NEXLETOL) 180 MG TABS Take 1 tablet by mouth every other day.    . butalbital-acetaminophen-caffeine (FIORICET) 50-325-40 MG tablet Take 1 tablet up to 4 x /day if needed for Severe Headache 20 tablet 0  . cetirizine-pseudoephedrine (ZYRTEC-D) 5-120 MG tablet Take 1 tablet by mouth 2 (two) times daily. (Patient taking differently: Take 1 tablet by mouth as needed. Takes one tablet daily) 60 tablet 2  . cholecalciferol (VITAMIN D3) 25 MCG (1000 UT) tablet Take 1,000 Units by mouth daily.    Marland Kitchen levothyroxine (SYNTHROID, LEVOTHROID) 50 MCG tablet TAKE 1 AND ONE-HALF TABLETS BY MOUTH DAILY.  Pt states alternating 43mcg and 38mcg every other day. 150 tablet 3  . naproxen sodium (ANAPROX) 220 MG tablet Take 440 mg by mouth 2 (two) times daily as needed (pain).     No current facility-administered medications on file prior to visit.   Allergies  Allergen Reactions  . Doxycycline Itching    itching  . Penicillins Hives  . Pravastatin     Myalgia  . Sulfa Antibiotics Hives  . Zetia [Ezetimibe]     Myalgia   Family History  Problem Relation Age of Onset  . Lung cancer Sister 82  . Throat cancer  Brother 73  . Stroke Mother   . Emphysema Father   . COPD Sister   . Diabetes Brother   . Colon cancer Neg Hx    PE: BP 138/70   Pulse (!) 110   Ht 5' 5.25" (1.657 m) Comment: measured today without shoes  Wt 168 lb (76.2 kg)   SpO2 95%   BMI 27.74 kg/m  Body mass index is 27.74 kg/m. Wt Readings from Last 3 Encounters:  10/26/19 168 lb (76.2 kg)  05/26/19 168 lb 9.6 oz (76.5 kg)  05/13/19 168 lb 6.4 oz (76.4 kg)   Constitutional: normal weight, in NAD Eyes: PERRLA, EOMI, no exophthalmos ENT: moist mucous membranes, no thyromegaly, no cervical lymphadenopathy Cardiovascular: tachycardia, RR, No MRG Respiratory: CTA B Gastrointestinal: abdomen soft, NT, ND, BS+ Musculoskeletal: no deformities, strength intact in all 4 Skin: moist, warm, no rashes Neurological: no tremor with outstretched hands, DTR normal in all 4  ASSESSMENT: 1. Hypothyroidism after RAI tx  -In the past, she wanted to just see PCP for the hypothyroidism, but she returns 6 months ago and would like to continue to follow with me  2. MNG  3. vitamin D deficiency  PLAN:  1. And 2.  Patient with history of thyrotoxicosis due to toxic multinodular goiter, resolved after RAI treatment.  She developed post ablative hypothyroidism and started levothyroxine.  She initially had severe leg cramps with levothyroxine, but she was also found to have a very low vitamin D and was started on supplementation.  We did try Tirosint (liquid levothyroxine) but this was expensive for her.  She also developed itching on the 75 mcg levothyroxine tablet so she is now taking only 50 mcg tablets, which every coloring agents.  Her itching resolved on these. - latest thyroid labs reviewed with pt >> normal 6 months ago: Lab Results  Component Value Date   TSH 3.11 04/22/2019   - she continues on LT4 50 alternating with  75 mcg every other day - pt feels good on this dose. - we discussed about taking the thyroid hormone every day,  with water, >30 minutes before breakfast, separated by >4 hours from acid reflux medications, calcium, iron, multivitamins. Pt. is taking it correctly. - will check thyroid tests today: TSH and fT4 - If labs are abnormal, she will need to return for repeat TFTs in 1.5 months - OTW, I will see her back in 6 months (per her preference)  3.  vitamin D deficiency  -She had a very low level of vitamin D in the past and unfortunately she could not tolerate 5000 units vitamin D daily.  I advised her to get the formulation. -She was on 1000 units vitamin D daily at last visit but level was still low, at 24.26 so I advised her to try to increase the dose to 2000 units daily, however she was concerned about constipation and did not increase it.  We discussed that we may need to do so at this visit and I explained that this should not cause constipation -We will recheck her vitamin D level today  Component     Latest Ref Rng & Units 10/26/2019  T4,Free(Direct)     0.60 - 1.60 ng/dL 0.96  TSH     0.35 - 4.50 uIU/mL 3.41  VITD     30.00 - 100.00 ng/mL 27.83 (L)   Thyroid tests are normal. Her vitamin D has improved further.  It is still, however, lower than goal.  I would suggest to add another 1000 units to her daily supplement dose.  Philemon Kingdom, MD PhD [facility] End[facility]

## 2019-10-27 ENCOUNTER — Telehealth: Payer: Self-pay

## 2019-10-27 NOTE — Telephone Encounter (Signed)
-----   Message from Philemon Kingdom, MD sent at 10/26/2019  4:55 PM EST ----- [name], can you please call pt: T hyroid tests are normal. Her vitamin D has improved further.  It is still, however, lower than goal.  I would suggest to add another 1000 units to her daily supplement dose and we will recheck it at next visit.

## 2019-10-28 NOTE — Telephone Encounter (Signed)
Notified patient of message from Dr. Gherghe, patient expressed understanding and agreement. No further questions.  

## 2019-11-03 ENCOUNTER — Ambulatory Visit: Payer: Medicare Other | Admitting: Internal Medicine

## 2019-11-05 ENCOUNTER — Ambulatory Visit: Payer: Medicare Other | Admitting: Internal Medicine

## 2020-01-10 NOTE — Progress Notes (Deleted)
MEDICARE ANNUAL WELLNESS VISIT AND FOLLOW UP  Assessment:    Encounter for Medicare annual wellness exam 1 year  Essential hypertension - continue medications, DASH diet, exercise and monitor at home. Call if greater than 130/80.  -     COMPLETE METABOLIC PANEL WITH GFR -     CBC with Differential/Platelet -     TSH -     Urinalysis, Routine w reflex microscopic  History of prediabetes Monitor  Postablative hypothyroidism  follows with Dr. Darnell Level, continue med the same at this time -     TSH  Hyperlipidemia -continue medications, check lipids, decrease fatty foods, increase activity.  -Newly followed by lipid clinic, on nexlitol, intolerant of numerous other medications  TMJ (temporomandibular joint syndrome) Monitor  Benign paroxysmal positional vertigo due to bilateral vestibular disorder Monitor  Multinodular goiter  follows with Dr. Darnell Level, continue med the same at this time  Vitamin D deficiency -     VITAMIN D 25 Hydroxy (Vit-D Deficiency, Fractures)  Poor compliance with medication No emphasized compliance with medications and appointments, patient expressed understanding  Other obsessive-compulsive disorders Patient reports improved symptoms  Monitor with routine screening  Medication management -     Magnesium  BMI 26.0-26.9,adult *** - long discussion about weight loss, diet, and exercise -recommended diet heavy in fruits and veggies and low in animal meats, cheeses, and dairy products   Over 30 minutes of exam, counseling, chart review, and critical decision making was performed  Future Appointments  Date Time Provider [place]  01/13/2020  9:00 AM Liane Comber, NP [facility] None  01/19/2020  9:00 AM [name], MD [facility] None  01/28/2020  8:00 AM Pixie Casino, MD [facility]  05/17/2020  9:20 AM Philemon Kingdom, MD [facility] None  11/02/2020  2:00 PM [name], MD [facility] None    Plan:   During the  course of the visit the patient was educated and counseled about appropriate screening and preventive services including:    Pneumococcal vaccine   Influenza vaccine  Td vaccine  Prevnar 13  Screening electrocardiogram  Screening mammography  Bone densitometry screening  Colorectal cancer screening  Diabetes screening  Glaucoma screening  Nutrition counseling   Advanced directives: given info/requested copies   Subjective:   Laura Maynard is a 74 y.o. female who presents for Medicare Annual Wellness Visit and 3 month follow up on hypertension, glucose management, hyperlipidemia, vitamin D def.  She reports is getting knee injections by Dr. Wynelle Link for bilateral knee arthritis and doing fairly.   BMI is There is no height or weight on file to calculate BMI., she has not been working on diet and exercise. She admits to minimal fruit, 1 serving of vegetables daily. 2.5 bottles of 16 ounces.  Wt Readings from Last 3 Encounters:  10/26/19 168 lb (76.2 kg)  05/26/19 168 lb 9.6 oz (76.5 kg)  05/13/19 168 lb 6.4 oz (76.4 kg)   Her blood pressure has been controlled at home, today their BP is   N/A no cuff at home  She does not workout. She denies chest pain, shortness of breath, dizziness.    She is not on cholesterol medication (hx of intolerance with numerous medications, follows lipid clinic and on nexlitol with some cost barriers ***) and denies myalgias. Her cholesterol is not at goal. The cholesterol last visit was:   Lab Results  Component Value Date   CHOL 269 (H) 05/13/2019   HDL 99 05/13/2019   LDLCALC 155 (  H) 05/13/2019   TRIG 57 05/13/2019   CHOLHDL 2.7 05/13/2019    She has been working on diet/exercise for glucose management. 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 Cruzita Lederer. Her medication was not changed last visit.   Lab Results  Component Value Date   TSH 3.41 10/26/2019       Medication Review  Current Outpatient Medications (Endocrine & Metabolic):  .  levothyroxine (SYNTHROID, LEVOTHROID) 50 MCG tablet, TAKE 1 AND ONE-HALF TABLETS BY MOUTH DAILY.  Pt states alternating 57mcg and 87mcg every other day.  Current Outpatient Medications (Cardiovascular):  Marland Kitchen  Bempedoic Acid (NEXLETOL) 180 MG TABS, Take 1 tablet by mouth every other day.  Current Outpatient Medications (Respiratory):  .  cetirizine-pseudoephedrine (ZYRTEC-D) 5-120 MG tablet, Take 1 tablet by mouth 2 (two) times daily. (Patient taking differently: Take 1 tablet by mouth as needed. Takes one tablet daily)  Current Outpatient Medications (Analgesics):  Marland Kitchen  Acetaminophen (TYLENOL) 325 MG CAPS, as needed.  .  butalbital-acetaminophen-caffeine (FIORICET) 50-325-40 MG tablet, Take 1 tablet up to 4 x /day if needed for Severe Headache .  naproxen sodium (ANAPROX) 220 MG tablet, Take 440 mg by mouth 2 (two) times daily as needed (pain).   Current Outpatient Medications (Other):  .  acetic acid-hydrocortisone (VOSOL-HC) OTIC solution, Place 3 drops into both ears 2 (two) times daily as needed. As needed to ear canals after getting ears wet to prevent infection. .  cholecalciferol (VITAMIN D3) 25 MCG (1000 UT) tablet, Take 1,000 Units by mouth daily.  Allergies: Allergies  Allergen Reactions  . Doxycycline Itching    itching  . Penicillins Hives  . Pravastatin     Myalgia  . Sulfa Antibiotics Hives  . Zetia [Ezetimibe]     Myalgia    Current Problems (verified) has Labile hypertension; History of prediabetes; Vitamin D deficiency; Hyperlipidemia, mixed; Poor compliance with medication; Benign paroxysmal positional vertigo; TMJ (temporomandibular joint syndrome); Postablative hypothyroidism; BMI  26.0-26.9,adult; Medication Phobia; Multinodular goiter; FH: hypertension; and Abnormal glucose on their problem list.  Screening Tests Immunization History  Administered Date(s) Administered  . Influenza Split 06/30/2013  . Influenza, High Dose Seasonal PF 06/26/2015, 07/25/2016, 05/26/2017, 06/30/2018, 06/10/2019  . Pneumococcal Conjugate-13 09/14/2014  . Pneumococcal Polysaccharide-23 10/16/2011, 02/04/2017  . Td 10/16/2011  . Zoster 01/14/2007    Preventative care: Last colonoscopy: 2012 cologuard 01/2017 DUE 01/2020 Last mammogram: 07/2019 DEXA: declines  Prior vaccinations: TD or Tdap: 2013  Influenza: 06/2019  Pneumococcal:  2013 Prevnar13:  2015 Shingles/Zostavax: 2008  Names of Other Physician/Practitioners you currently use: 1. [facility]- here for primary care 2. Dr Anastasio Champion. , eye doctor, last visit   April 2019, had cataract 3. Dr. Clifton James, dentist, last visit 2019, q97m  Patient Care Team: [name], MD as PCP - General (Internal Medicine) Vanessa Kick, MD (Dermatology) Philemon Kingdom, MD as Consulting Physician (Internal Medicine) Marcial Pacas, MD as Consulting Physician (Neurology) Gatha Mayer, MD as Consulting Physician (Gastroenterology) Rozetta Nunnery, MD as Consulting Physician (Otolaryngology) Gaynelle Arabian, MD as Consulting Physician (Orthopedic Surgery) Dr. Ulanda Edison  Surgical: She  has a past surgical history that includes Eye surgery (1964);  Back surgery (1970); Nasal septum surgery (1998); Total abdominal hysterectomy (2000); Knee arthroscopy (2002); Colonoscopy (2006; 03/28/11); and Cataract extraction (Left, 12/2017). Family Her family history includes COPD in her sister; Diabetes in her brother; Emphysema in her father; Lung cancer (age of onset: 50) in her sister; Stroke in her mother; Throat cancer (age of onset: 58) in her brother. Social history  She reports that she has never smoked. She  has never used smokeless tobacco. She reports that she does not drink alcohol or use drugs.  MEDICARE WELLNESS OBJECTIVES: Physical activity:   Cardiac risk factors:   Depression/mood screen:   Depression screen [facility] 2/9 05/15/2019  Decreased Interest 0  Down, Depressed, Hopeless 0  PHQ - 2 Score 0    ADLs:  No flowsheet data found.   Cognitive Testing  Alert? Yes  Normal Appearance?Yes  Oriented to person? Yes  Place? Yes   Time? Yes  Recall of three objects?  Yes  Can perform simple calculations? Yes  Displays appropriate judgment?Yes  Can read the correct time from a watch face?Yes  EOL planning:     Objective:   There were no vitals filed for this visit. Wt Readings from Last 3 Encounters:  10/26/19 168 lb (76.2 kg)  05/26/19 168 lb 9.6 oz (76.5 kg)  05/13/19 168 lb 6.4 oz (76.4 kg)    There is no height or weight on file to calculate BMI.  General appearance: alert, no distress, WD/WN,  female HEENT: normocephalic, sclerae anicteric, TMs pearly, nares patent, no discharge or erythema, pharynx normal Oral cavity: MMM, no lesions Neck: supple, no lymphadenopathy, no thyromegaly, no masses Heart: RRR, normal S1, S2, no murmurs Lungs: CTA bilaterally, no wheezes, rhonchi, or rales Abdomen: +bs, soft, non tender, non distended, no masses, no hepatomegaly, no splenomegaly Musculoskeletal: nontender C7, + pain with neck flexion, no swelling, no obvious deformity Extremities: no edema, no cyanosis, no clubbing Pulses: 2+ symmetric, upper and lower extremities, normal cap refill Neurological: alert, oriented x 3, CN2-12 intact, strength normal upper extremities and lower extremities, + tinel's sign bilateral hands, sensation normal throughout, DTRs 2+ throughout, no cerebellar signs, gait normal Psychiatric: normal affect, behavior normal, pleasant    Medicare Attestation I have personally reviewed: The patient's medical and social history Their use of alcohol, tobacco  or illicit drugs Their current medications and supplements The patient's functional ability including ADLs,fall risks, home safety risks, cognitive, and hearing and visual impairment Diet and physical activities Evidence for depression or mood disorders  The patient's weight, height, BMI, and visual acuity have been recorded in the chart.  I have made referrals, counseling, and provided education to the patient based on review of the above and I have provided the patient with a written personalized care plan for preventive services.     Laura Ribas, NP   01/10/2020

## 2020-01-13 ENCOUNTER — Ambulatory Visit: Payer: Medicare Other | Admitting: Adult Health

## 2020-01-17 ENCOUNTER — Other Ambulatory Visit: Payer: Self-pay | Admitting: Internal Medicine

## 2020-01-18 ENCOUNTER — Encounter: Payer: Self-pay | Admitting: Internal Medicine

## 2020-01-18 NOTE — Progress Notes (Signed)
Annual Screening/Preventative Visit & Comprehensive Evaluation &  Examination     This very nice 74 y.o. DBF  presents for a Screening /Preventative Visit & comprehensive evaluation and management of multiple medical co-morbidities.  Patient has been followed for HTN, HLD, Prediabetes  and Vitamin D Deficiency.  Patient has long hx/o medicine phobia and  poor medication compliance.      Labile  HTN predates since 2012. Patient's BP has been controlled at home and patient denies any cardiac symptoms as chest pain, palpitations, shortness of breath, dizziness or ankle swelling. Today's BP is at goal -  116/76      Patient's hyperlipidemia is not controlled with diet as she has been reticent to take statins for Cholesterol, but she had been rx'd Nexletol at 1/2 dose at the [facility] by Dr Debara Pickett, but she never filled due to high co-pay! Patient denies myalgias or other medication SE's. Last lipids were not at goal:  Lab Results  Component Value Date   CHOL 269 (H) 05/13/2019   HDL 99 05/13/2019   LDLCALC 155 (H) 05/13/2019   TRIG 57 05/13/2019   CHOLHDL 2.7 05/13/2019       Patient has hx/o prediabetes (A1c 5.7% / 2012) and patient denies reactive hypoglycemic symptoms, visual blurring, diabetic polys or paresthesias. Last A1c was Normal & at goal:   Lab Results  Component Value Date   HGBA1C 5.3 05/13/2019         Patient has hx/o Toxic MNG treated with RAI-131* in Sept 2015 and Dr Cruzita Lederer is managing her thyroid dosing.     Finally, patient has history of Vitamin D Deficiency ("10" / 2010 and "8" / 2018) and last Vitamin D was Not at goal:  Lab Results  Component Value Date   VD25OH 27.83 (L) 10/26/2019    Current Outpatient Medications on File Prior to Visit  Medication Sig  . Acetaminophen  325 MG CAPS as needed.   Marland Kitchen VOSOL-HC OTIC soln Place 3 drops into both ears 2 (two) times daily as needed. As needed to ear canals after getting ears wet to prevent infection.  Marland Kitchen  FIORICET Take 1 tablet up to 4 x /day if needed for Severe Headache  . ZYRTEC-D 5-120 MG tablet  Takes one tablet daily)  . VITAMIN D 25 MCG (1000 UT) tablet Take 1,000 Units by mouth daily.  Marland Kitchen levothyroxine (SYNTHROID) 50 MCG tablet TAKE 1 AND 1/2 TABLETS BY MOUTH DAILY  . naproxen sodium (ANAPROX) 220 MG tablet Take 440 mg by mouth 2 (two) times daily as needed (pain).  . NEXLETOL 180 MG TABS Take 1 tablet  every other day - NOT TAKING    Allergies  Allergen Reactions  . Doxycycline Itching    itching  . Penicillins Hives  . Pravastatin     Myalgia  . Sulfa Antibiotics Hives  . Zetia [Ezetimibe]     Myalgia   Past Medical History:  Diagnosis Date  . Blind right eye    legally  . 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 High cholesterol   . Hypertension   . Hypothyroidism   . Migraine   . Prediabetes   . Sarcoidosis, lung ([place]) 15 YRS AGO   NO TREATMENTS  . 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 Vertigo   . Vitamin D deficiency    Health Maintenance  Topic Date Due  . Hepatitis C Screening  Never done  . COVID-19 Vaccine (1) Never done  . DEXA SCAN  Never done  .  Fecal DNA (Cologuard)  02/18/2020  . INFLUENZA VACCINE  04/30/2020  . MAMMOGRAM  07/21/2020  . TETANUS/TDAP  10/15/2021  . PNA vac Low Risk Adult  Completed   Immunization History  Administered Date(s) Administered  . Influenza Split 06/30/2013  . Influenza, High Dose Seasonal PF 06/26/2015, 07/25/2016, 05/26/2017, 06/30/2018, 06/10/2019  . Pneumococcal Conjugate-13 09/14/2014  . Pneumococcal Polysaccharide-23 10/16/2011, 02/04/2017  . Td 10/16/2011  . Zoster 01/14/2007    Last Colon - 07/15/2005 - Dr Carlean Purl - recc 5 year f/u. - Sent recall letter 04/23/2016 - OVERDUE  Last MGM - 07/22/2019  Past Surgical History:  Procedure Laterality Date  . BACK SURGERY  1970  . CATARACT EXTRACTION Left 12/2017  . COLONOSCOPY  2006; 03/28/11   2 small adenomas; normal  . EYE SURGERY  1964   right eye  .  KNEE ARTHROSCOPY  2002  . NASAL SEPTUM SURGERY  1998  . TOTAL ABDOMINAL HYSTERECTOMY  2000   Family History  Problem Relation Age of Onset  . Lung cancer Sister 3  . Throat cancer Brother 64  . Stroke Mother   . Emphysema Father   . COPD Sister   . Diabetes Brother   . Colon cancer Neg Hx    Social History   Tobacco Use  . Smoking status: Never Smoker  . Smokeless tobacco: Never Used  Substance Use Topics  . Alcohol use: Never    Alcohol/week: 1.0 standard drinks    Types: 1 Standard drinks or equivalent per week    Comment: RARE  . Drug use: Never    ROS Constitutional: Denies fever, chills, weight loss/gain, headaches, insomnia,  night sweats, and change in appetite. Does c/o fatigue. Eyes: Denies redness, blurred vision, diplopia, discharge, itchy, watery eyes.  ENT: Denies discharge, congestion, post nasal drip, epistaxis, sore throat, earache, hearing loss, dental pain, Tinnitus, Vertigo, Sinus pain, snoring.  Cardio: Denies chest pain, palpitations, irregular heartbeat, syncope, dyspnea, diaphoresis, orthopnea, PND, claudication, edema Respiratory: denies cough, dyspnea, DOE, pleurisy, hoarseness, laryngitis, wheezing.  Gastrointestinal: Denies dysphagia, heartburn, reflux, water brash, pain, cramps, nausea, vomiting, bloating, diarrhea, constipation, hematemesis, melena, hematochezia, jaundice, hemorrhoids Genitourinary: Denies dysuria, frequency, urgency, nocturia, hesitancy, discharge, hematuria, flank pain Breast: Breast lumps, nipple discharge, bleeding.  Musculoskeletal: Denies arthralgia, myalgia, stiffness, Jt. Swelling, pain, limp, and strain/sprain. Denies falls. Skin: Denies puritis, rash, hives, warts, acne, eczema, changing in skin lesion Neuro: No weakness, tremor, incoordination, spasms, paresthesia, pain Psychiatric: Denies confusion, memory loss, sensory loss. Denies Depression. Endocrine: Denies change in weight, skin, hair change, nocturia, and  paresthesia, diabetic polys, visual blurring, hyper / hypo glycemic episodes.  Heme/Lymph: No excessive bleeding, bruising, enlarged lymph nodes.  Physical Exam  BP 116/76   Pulse 76   Temp (!) 97.2 F (36.2 C)   Resp 16   Ht 5\' 6"  (1.676 m)   Wt 173 lb 3.2 oz (78.6 kg)   BMI 27.96 kg/m   General Appearance: Well nourished, well groomed and in no apparent distress.  Eyes: PERRLA, EOMs, conjunctiva no swelling or erythema, normal fundi and vessels. Sinuses: No frontal/maxillary tenderness ENT/Mouth: EACs patent / TMs  nl. Nares clear without erythema, swelling, mucoid exudates. Oral hygiene is good. No erythema, swelling, or exudate. Tongue normal, non-obstructing. Tonsils not swollen or erythematous. Hearing normal.  Neck: Supple, thyroid not palpable. No bruits, nodes or JVD. Respiratory: Respiratory effort normal.  BS equal and clear bilateral without rales, rhonci, wheezing or stridor. Cardio: Heart sounds are normal with regular rate and rhythm  and no murmurs, rubs or gallops. Peripheral pulses are normal and equal bilaterally without edema. No aortic or femoral bruits. Chest: symmetric with normal excursions and percussion. Breasts: Symmetric, without lumps, nipple discharge, retractions, or fibrocystic changes.  Abdomen: Flat, soft with bowel sounds active. Nontender, no guarding, rebound, hernias, masses, or organomegaly.  Lymphatics: Non tender without lymphadenopathy.  Genitourinary:  Musculoskeletal: Full ROM all peripheral extremities, joint stability, 5/5 strength, and normal gait. Skin: Warm and dry without rashes, lesions, cyanosis, clubbing or  ecchymosis.  Neuro: Cranial nerves intact, reflexes equal bilaterally. Normal muscle tone, no cerebellar symptoms. Sensation intact.  Pysch: Alert and oriented X 3, normal affect, Insight and Judgment appropriate.   Assessment and Plan  1. Annual Preventative Screening Examination  2. Labile hypertension  - EKG 12-Lead -  Urinalysis, Routine w reflex microscopic - Microalbumin / creatinine urine ratio - CBC with Differential/Platelet - COMPLETE METABOLIC PANEL WITH GFR - Magnesium - TSH  3. Hyperlipidemia, mixed  - EKG 12-Lead - Lipid panel - TSH  4. Abnormal glucose  - EKG 12-Lead - Hemoglobin A1c - Insulin, random  5. Vitamin D deficiency  - VITAMIN D 25 Hydroxy   6. Postablative hypothyroidism  - TSH  7. Fear of side effects of medication  8. Screening for colorectal cancer  - POC Hemoccult Bld/Stl  9. Prediabetes  - Hemoglobin A1c - Insulin, random  10. Screening for ischemic heart disease  - EKG 12-Lead  11. FH: hypertension  - EKG 12-Lead  12. Medication management  - Urinalysis, Routine w reflex microscopic - Microalbumin / creatinine urine ratio - CBC with Differential/Platelet - COMPLETE METABOLIC PANEL WITH GFR - Magnesium - Lipid panel - TSH - Hemoglobin A1c - Insulin, random - VITAMIN D 25 Hydroxy           Patient was counseled in prudent diet to achieve/maintain BMI less than 25 for weight control, BP monitoring, regular exercise and medications. Discussed med's effects and SE's. Screening labs and tests as requested with regular follow-up as recommended. Over 40 minutes of exam, counseling, chart review and high complex critical decision making was performed.   Kirtland Bouchard, MD

## 2020-01-18 NOTE — Patient Instructions (Signed)

## 2020-01-19 ENCOUNTER — Ambulatory Visit (INDEPENDENT_AMBULATORY_CARE_PROVIDER_SITE_OTHER): Payer: Medicare PPO | Admitting: Internal Medicine

## 2020-01-19 ENCOUNTER — Other Ambulatory Visit: Payer: Self-pay

## 2020-01-19 VITALS — BP 116/76 | HR 76 | Temp 97.2°F | Resp 16 | Ht 66.0 in | Wt 173.2 lb

## 2020-01-19 DIAGNOSIS — R7303 Prediabetes: Secondary | ICD-10-CM

## 2020-01-19 DIAGNOSIS — R0989 Other specified symptoms and signs involving the circulatory and respiratory systems: Secondary | ICD-10-CM

## 2020-01-19 DIAGNOSIS — E890 Postprocedural hypothyroidism: Secondary | ICD-10-CM

## 2020-01-19 DIAGNOSIS — E559 Vitamin D deficiency, unspecified: Secondary | ICD-10-CM

## 2020-01-19 DIAGNOSIS — Z87898 Personal history of other specified conditions: Secondary | ICD-10-CM | POA: Diagnosis not present

## 2020-01-19 DIAGNOSIS — Z136 Encounter for screening for cardiovascular disorders: Secondary | ICD-10-CM

## 2020-01-19 DIAGNOSIS — Z0000 Encounter for general adult medical examination without abnormal findings: Secondary | ICD-10-CM

## 2020-01-19 DIAGNOSIS — G44219 Episodic tension-type headache, not intractable: Secondary | ICD-10-CM

## 2020-01-19 DIAGNOSIS — Z79899 Other long term (current) drug therapy: Secondary | ICD-10-CM

## 2020-01-19 DIAGNOSIS — R7309 Other abnormal glucose: Secondary | ICD-10-CM

## 2020-01-19 DIAGNOSIS — Z0001 Encounter for general adult medical examination with abnormal findings: Secondary | ICD-10-CM

## 2020-01-19 DIAGNOSIS — Z8249 Family history of ischemic heart disease and other diseases of the circulatory system: Secondary | ICD-10-CM

## 2020-01-19 DIAGNOSIS — E782 Mixed hyperlipidemia: Secondary | ICD-10-CM

## 2020-01-19 DIAGNOSIS — Z1211 Encounter for screening for malignant neoplasm of colon: Secondary | ICD-10-CM

## 2020-01-19 DIAGNOSIS — F40298 Other specified phobia: Secondary | ICD-10-CM

## 2020-01-19 MED ORDER — BUTALBITAL-APAP-CAFFEINE 50-325-40 MG PO TABS: ORAL_TABLET | ORAL | 0 refills | Status: DC

## 2020-01-20 LAB — COMPLETE METABOLIC PANEL WITH GFR
AG Ratio: 1.8 (calc) (ref 1.0–2.5)
ALT: 12 U/L (ref 6–29)
AST: 22 U/L (ref 10–35)
Albumin: 4.3 g/dL (ref 3.6–5.1)
Alkaline phosphatase (APISO): 98 U/L (ref 37–153)
BUN: 13 mg/dL (ref 7–25)
CO2: 27 mmol/L (ref 20–32)
Calcium: 9.6 mg/dL (ref 8.6–10.4)
Chloride: 106 mmol/L (ref 98–110)
Creat: 0.7 mg/dL (ref 0.60–0.93)
GFR, Est African American: 100 mL/min/{1.73_m2} (ref >=60)
GFR, Est Non African American: 86 mL/min/{1.73_m2} (ref >=60)
Globulin: 2.4 g/dL (calc) (ref 1.9–3.7)
Glucose, Bld: 82 mg/dL (ref 65–99)
Potassium: 4.2 mmol/L (ref 3.5–5.3)
Sodium: 141 mmol/L (ref 135–146)
Total Bilirubin: 0.4 mg/dL (ref 0.2–1.2)
Total Protein: 6.7 g/dL (ref 6.1–8.1)

## 2020-01-20 LAB — CBC WITH DIFFERENTIAL/PLATELET
Absolute Monocytes: 415 cells/uL (ref 200–950)
Basophils Absolute: 30 cells/uL (ref 0–200)
Basophils Relative: 0.6 %
Eosinophils Absolute: 60 cells/uL (ref 15–500)
Eosinophils Relative: 1.2 %
HCT: 36.7 % (ref 35.0–45.0)
Hemoglobin: 11.9 g/dL (ref 11.7–15.5)
Lymphs Abs: 1490 cells/uL (ref 850–3900)
MCH: 27.4 pg (ref 27.0–33.0)
MCHC: 32.4 g/dL (ref 32.0–36.0)
MCV: 84.4 fL (ref 80.0–100.0)
MPV: 9.6 fL (ref 7.5–12.5)
Monocytes Relative: 8.3 %
Neutro Abs: 3005 cells/uL (ref 1500–7800)
Neutrophils Relative %: 60.1 %
Platelets: 381 10*3/uL (ref 140–400)
RBC: 4.35 10*6/uL (ref 3.80–5.10)
RDW: 13.4 % (ref 11.0–15.0)
Total Lymphocyte: 29.8 %
WBC: 5 10*3/uL (ref 3.8–10.8)

## 2020-01-20 LAB — HEMOGLOBIN A1C
Hgb A1c MFr Bld: 5.3 % of total Hgb (ref <5.7)
Mean Plasma Glucose: 105 (calc)
eAG (mmol/L): 5.8 (calc)

## 2020-01-20 LAB — LIPID PANEL
Cholesterol: 268 mg/dL — ABNORMAL HIGH (ref <200)
HDL: 100 mg/dL (ref >=50)
LDL Cholesterol (Calc): 152 mg/dL (calc) — ABNORMAL HIGH
Non-HDL Cholesterol (Calc): 168 mg/dL (calc) — ABNORMAL HIGH (ref <130)
Total CHOL/HDL Ratio: 2.7 (calc) (ref <5.0)
Triglycerides: 63 mg/dL (ref <150)

## 2020-01-20 LAB — URINALYSIS, ROUTINE W REFLEX MICROSCOPIC
Bacteria, UA: NONE SEEN /HPF
Bilirubin Urine: NEGATIVE
Glucose, UA: NEGATIVE
Hyaline Cast: NONE SEEN /LPF
Ketones, ur: NEGATIVE
Leukocytes,Ua: NEGATIVE
Nitrite: NEGATIVE
Protein, ur: NEGATIVE
Specific Gravity, Urine: 1.014 (ref 1.001–1.03)
Squamous Epithelial / HPF: NONE SEEN /HPF (ref <=5)
WBC, UA: NONE SEEN /HPF (ref 0–5)
pH: <=5 (ref 5.0–8.0)

## 2020-01-20 LAB — TSH: TSH: 3.77 mIU/L (ref 0.40–4.50)

## 2020-01-20 LAB — MICROALBUMIN / CREATININE URINE RATIO
Creatinine, Urine: 75 mg/dL (ref 20–275)
Microalb Creat Ratio: 7 mcg/mg creat (ref <30)
Microalb, Ur: 0.5 mg/dL

## 2020-01-20 LAB — VITAMIN D 25 HYDROXY (VIT D DEFICIENCY, FRACTURES): Vit D, 25-Hydroxy: 22 ng/mL — ABNORMAL LOW (ref 30–100)

## 2020-01-20 LAB — INSULIN, RANDOM: Insulin: 4.1 u[IU]/mL

## 2020-01-20 LAB — MAGNESIUM: Magnesium: 2.2 mg/dL (ref 1.5–2.5)

## 2020-01-21 ENCOUNTER — Other Ambulatory Visit: Payer: Self-pay | Admitting: Internal Medicine

## 2020-01-21 ENCOUNTER — Encounter: Payer: Self-pay | Admitting: *Deleted

## 2020-01-21 DIAGNOSIS — Z1211 Encounter for screening for malignant neoplasm of colon: Secondary | ICD-10-CM

## 2020-01-28 ENCOUNTER — Ambulatory Visit: Payer: Medicare PPO | Admitting: Internal Medicine

## 2020-02-18 DIAGNOSIS — M170 Bilateral primary osteoarthritis of knee: Secondary | ICD-10-CM | POA: Diagnosis not present

## 2020-03-06 DIAGNOSIS — H1010 Acute atopic conjunctivitis, unspecified eye: Secondary | ICD-10-CM | POA: Diagnosis not present

## 2020-03-06 DIAGNOSIS — H31011 Macula scars of posterior pole (postinflammatory) (post-traumatic), right eye: Secondary | ICD-10-CM | POA: Diagnosis not present

## 2020-03-06 DIAGNOSIS — H04123 Dry eye syndrome of bilateral lacrimal glands: Secondary | ICD-10-CM | POA: Insufficient documentation

## 2020-03-06 DIAGNOSIS — H2701 Aphakia, right eye: Secondary | ICD-10-CM | POA: Diagnosis not present

## 2020-03-09 ENCOUNTER — Other Ambulatory Visit: Payer: Self-pay | Admitting: Internal Medicine

## 2020-03-09 ENCOUNTER — Telehealth: Payer: Self-pay | Admitting: *Deleted

## 2020-03-09 MED ORDER — ONDANSETRON HCL 8 MG PO TABS: ORAL_TABLET | ORAL | 0 refills | Status: DC

## 2020-03-09 NOTE — Telephone Encounter (Signed)
Patient called and reported she has a headache, along with nausea and vomiting, all day. She has not been able to keep her medication down, due to the vomiting. Patient was informed that Dr Melford Aase will send in a medication for nausea and it should help her Fioricet to be more  effective.

## 2020-04-05 ENCOUNTER — Other Ambulatory Visit: Payer: Self-pay | Admitting: Internal Medicine

## 2020-04-05 DIAGNOSIS — Z1212 Encounter for screening for malignant neoplasm of rectum: Secondary | ICD-10-CM

## 2020-04-17 DIAGNOSIS — Z1211 Encounter for screening for malignant neoplasm of colon: Secondary | ICD-10-CM | POA: Diagnosis not present

## 2020-04-17 DIAGNOSIS — Z1212 Encounter for screening for malignant neoplasm of rectum: Secondary | ICD-10-CM | POA: Diagnosis not present

## 2020-04-17 DIAGNOSIS — G720 Drug-induced myopathy: Secondary | ICD-10-CM | POA: Insufficient documentation

## 2020-04-17 NOTE — Progress Notes (Signed)
MEDICARE ANNUAL WELLNESS VISIT AND FOLLOW UP  Assessment:    Encounter for Medicare annual wellness exam 1 year  Essential hypertension - continue medications, DASH diet, exercise and monitor at home. Call if greater than 130/80.  -     COMPLETE METABOLIC PANEL WITH GFR -     CBC with Differential/Platelet  History of prediabetes Monitor  Postablative hypothyroidism  follows with Dr. Darnell Level, continue med the same at this time, declines TSH today, has upcoming appointment   Hyperlipidemia -declines medications  check lipids, decrease fatty foods, increase activity.  -law lipid clinic, wasn't recommended PCSK9i, was recommended low risk due to high HDL -zetia intolerance, not candidate for nexlitol due to lack of vascular dx; no aortic atherosclerosis per coronary calcium CT  TMJ (temporomandibular joint syndrome) Monitor  Benign paroxysmal positional vertigo due to bilateral vestibular disorder Monitor, takes meclizine PRN, none recent   Multinodular goiter  follows with Dr. Darnell Level, continue med the same at this time  Vitamin D deficiency -     VITAMIN D 25 Hydroxy (Vit-D Deficiency, Fractures)  Poor compliance with medication No emphasized compliance with medications and appointments, patient expressed understanding  Other obsessive-compulsive disorders Patient reports improved symptoms  Monitor with routine screening  Medication management -     Magnesium  BMI 26.0-26.9,adult - long discussion about weight loss, diet, and exercise -recommended diet heavy in fruits and veggies and low in animal meats, cheeses, and dairy products  Migraines Fioricent works but with nausea/emesis; discussed nurtec, samples given  Follow up as needed   Over 30 minutes of exam, counseling, chart review, and critical decision making was performed  Future Appointments  Date Time Provider [place]  05/17/2020  9:20 AM Philemon Kingdom, MD [facility] None  07/20/2020  2:30 PM  [name], MD [facility] None  01/23/2021 10:00 AM [name], MD [facility] None    Plan:   During the course of the visit the patient was educated and counseled about appropriate screening and preventive services including:    Pneumococcal vaccine   Influenza vaccine  Td vaccine  Prevnar 13  Screening electrocardiogram  Screening mammography  Bone densitometry screening  Colorectal cancer screening  Diabetes screening  Glaucoma screening  Nutrition counseling   Advanced directives: given info/requested copies   Subjective:   Laura Maynard is a 74 y.o. female who presents for Medicare Annual Wellness Visit and 3 month follow up on hypertension, glucose management, hyperlipidemia, vitamin D def.  She is getting knee injections by Dr. Wynelle Link for bilateral knee arthritis and doing fairly.   She has long history of migraines, typically wake up with it, usually once every two weeks or so, very nauseous, will take 1/2 tab fioricet but will throw up and repeats. Has been prescribed zofran but hasn't tried yet.   BMI is Body mass index is 27.66 kg/m., she has not been working on diet, has been doing 10 min of stationary cycle daily  Wt Readings from Last 3 Encounters:  04/19/20 171 lb 6.4 oz (77.7 kg)  01/19/20 173 lb 3.2 oz (78.6 kg)  10/26/19 168 lb (76.2 kg)   Her blood pressure has been controlled at home, today their BP is BP: 116/66  She does not workout. She denies chest pain, shortness of breath, dizziness.    She is on cholesterol medication (prescribed nexlitol, hx of myalgia with pravastatin and zetia, has adamantly declined all other medications, has seen lipid clinic, coronary calcium score of 43, 64th percentile, NO atherosclerosis,  was not recommended PCSK9) and denies myalgias. Her cholesterol is not at goal. 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 Cruzita Lederer. Her medication was not changed last visit.   Has upcoming visit in August, declines TSH today  Lab Results  Component Value Date   TSH 3.77 01/19/2020      Medication Review  Current Outpatient Medications (Endocrine & Metabolic):  .  levothyroxine (SYNTHROID) 50 MCG tablet, TAKE 1 AND 1/2 TABLETS BY MOUTH DAILY   Current Outpatient Medications (Respiratory):  .  cetirizine-pseudoephedrine (ZYRTEC-D) 5-120 MG tablet, Take 1 tablet by mouth 2 (two) times daily. (Patient not taking: Reported on 04/19/2020)  Current Outpatient Medications (Analgesics):  Marland Kitchen  Acetaminophen (TYLENOL) 325 MG CAPS, as needed.  .  butalbital-acetaminophen-caffeine (FIORICET) 50-325-40 MG tablet, Take 1 tablet every 4 hours  if needed for Severe Headache .  naproxen sodium (ANAPROX) 220 MG tablet, Take 440 mg by mouth 2 (two) times daily as needed (pain). (Patient not taking: Reported on 04/19/2020)   Current Outpatient Medications (Other):  .  acetic acid-hydrocortisone (VOSOL-HC) OTIC solution, Place 3 drops into both ears 2 (two) times daily as needed. As needed to ear canals after getting ears wet to prevent infection. .  cholecalciferol (VITAMIN D3) 25 MCG (1000 UT) tablet, Take 1,000 Units by mouth daily. .  ondansetron (ZOFRAN) 8 MG tablet, Take 1/2 to 1 tablet 3 x /day every 6 hours as needed for Nausea or Vomitting  Allergies: Allergies  Allergen Reactions  . Doxycycline Itching    itching  . Penicillins Hives  . Pravastatin      Myalgia  . Sulfa Antibiotics Hives  . Zetia [Ezetimibe]     Myalgia    Current Problems (verified) has Labile hypertension; History of prediabetes; Vitamin D deficiency; Hyperlipidemia, mixed; Poor compliance with medication; Benign paroxysmal positional vertigo; TMJ (temporomandibular joint syndrome); Postablative hypothyroidism; BMI 26.0-26.9,adult; Medication Phobia; Multinodular goiter; FH: hypertension; Abnormal glucose; and Statin myopathy on their problem list.  Screening Tests Immunization History  Administered Date(s) Administered  . Influenza Split 06/30/2013  . Influenza, High Dose Seasonal PF 06/26/2015, 07/25/2016, 05/26/2017, 06/30/2018, 06/10/2019  . PFIZER SARS-COV-2 Vaccination 11/26/2019, 12/25/2019  . Pneumococcal Conjugate-13 09/14/2014  . Pneumococcal Polysaccharide-23 10/16/2011, 02/04/2017  . Td 10/16/2011  . Zoster 01/14/2007    Preventative care: Last colonoscopy: 2012 cologuard 01/2017 DUE has been ordered, patient reports sent in this week, pending results  Last mammogram: 07/2019 DEXA: declines, may consider next year  Prior vaccinations: TD or Tdap: 2013  Influenza: 06/2019  Pneumococcal:  2013 Prevnar13:  2015 Shingles/Zostavax: 2008 Covid 19: 2/2, 2021, pfizer  Names of Other Physician/Practitioners you currently use: 1. [facility]- here for primary care 2. [facility] in [place], eye doctor, last visit 2021 3. Dr. Clifton James, dentist, last visit 2021, q36m  Patient  Care Team: [name], MD as PCP - General (Internal Medicine) Vanessa Kick, MD (Dermatology) Philemon Kingdom, MD as Consulting Physician (Internal Medicine) Marcial Pacas, MD as Consulting Physician (Neurology) Gatha Mayer, MD as Consulting Physician (Gastroenterology) Rozetta Nunnery, MD as Consulting Physician (Otolaryngology) Gaynelle Arabian, MD as Consulting Physician (Orthopedic Surgery) Dr. Ulanda Edison  Surgical: She  has a  past surgical history that includes Eye surgery (1964); Back surgery (1970); Nasal septum surgery (1998); Total abdominal hysterectomy (2000); Knee arthroscopy (2002); Colonoscopy (2006; 03/28/11); and Cataract extraction (Left, 12/2017). Family Her family history includes COPD in her sister; Diabetes in her brother; Emphysema in her father; Lung cancer (age of onset: 3) in her sister; Stroke in her mother; Throat cancer (age of onset: 65) in her brother. Social history  She reports that she has never smoked. She has never used smokeless tobacco. She reports that she does not drink alcohol and does not use drugs.  MEDICARE WELLNESS OBJECTIVES: Physical activity: Current Exercise Habits: Home exercise routine, Type of exercise: treadmill, Time (Minutes): 10, Frequency (Times/Week): 7, Weekly Exercise (Minutes/Week): 70, Intensity: Mild, Exercise limited by: orthopedic condition(s) Cardiac risk factors: Cardiac Risk Factors include: advanced age (>63men, >16 women);dyslipidemia;hypertension;sedentary lifestyle Depression/mood screen:   Depression screen [facility] 2/9 04/19/2020  Decreased Interest 0  Down, Depressed, Hopeless 0  PHQ - 2 Score 0    ADLs:  In your present state of health, do you have any difficulty performing the following activities: 04/19/2020 01/18/2020  Hearing? N N  Vision? N N  Difficulty concentrating or making decisions? N N  Walking or climbing stairs? N N  Dressing or bathing? N N  Doing errands, shopping? N N  Some recent data might be hidden     Cognitive Testing  Alert? Yes  Normal Appearance?Yes  Oriented to person? Yes  Place? Yes   Time? Yes  Recall of three objects?  Yes  Can perform simple calculations? Yes  Displays appropriate judgment?Yes  Can read the correct time from a watch face?Yes  EOL planning: Does Patient Have a Medical Advance Directive?: Yes Type of Advance Directive: Healthcare Power of Attorney, Living will Does patient want to make changes  to medical advance directive?: No - Patient declined Copy of [place] in Chart?: No - copy requested   Objective:   Today's Vitals   04/19/20 0934  BP: 116/66  Pulse: 77  Temp: (!) 97.3 F (36.3 C)  SpO2: 98%  Weight: 171 lb 6.4 oz (77.7 kg)   Wt Readings from Last 3 Encounters:  04/19/20 171 lb 6.4 oz (77.7 kg)  01/19/20 173 lb 3.2 oz (78.6 kg)  10/26/19 168 lb (76.2 kg)    Body mass index is 27.66 kg/m.  General appearance: alert, no distress, WD/WN,  female HEENT: normocephalic, sclerae anicteric, TMs pearly, nares patent, no discharge or erythema, pharynx normal. R pupil fixed (since teen following eye trauma), L round and reactive  Oral cavity: MMM, no lesions Neck: supple, no lymphadenopathy, no thyromegaly, no masses Heart: RRR, normal S1, S2, no murmurs Lungs: CTA bilaterally, no wheezes, rhonchi, or rales Abdomen: +bs, soft, non tender, non distended, no masses, no hepatomegaly, no splenomegaly Musculoskeletal: nontender C7, + pain with neck flexion, no swelling, no obvious deformity Extremities: no edema, no cyanosis, no clubbing Pulses: 2+ symmetric, upper and lower extremities, normal cap refill Neurological: alert, oriented x 3, CN2-12 intact except pupils, strength normal upper extremities and lower extremities, sensation normal throughout, DTRs 2+ throughout, no cerebellar signs,  gait normal Psychiatric: normal affect, behavior normal, pleasant     Medicare Attestation I have personally reviewed: The patient's medical and social history Their use of alcohol, tobacco or illicit drugs Their current medications and supplements The patient's functional ability including ADLs,fall risks, home safety risks, cognitive, and hearing and visual impairment Diet and physical activities Evidence for depression or mood disorders  The patient's weight, height, BMI, and visual acuity have been recorded in the chart.  I have made referrals, counseling,  and provided education to the patient based on review of the above and I have provided the patient with a written personalized care plan for preventive services.     Izora Ribas, NP   04/19/2020

## 2020-04-19 ENCOUNTER — Ambulatory Visit: Payer: Medicare PPO | Admitting: Adult Health

## 2020-04-19 ENCOUNTER — Other Ambulatory Visit: Payer: Self-pay

## 2020-04-19 ENCOUNTER — Encounter: Payer: Self-pay | Admitting: Adult Health

## 2020-04-19 VITALS — BP 116/66 | HR 77 | Temp 97.3°F | Wt 171.4 lb

## 2020-04-19 DIAGNOSIS — Z87898 Personal history of other specified conditions: Secondary | ICD-10-CM

## 2020-04-19 DIAGNOSIS — E559 Vitamin D deficiency, unspecified: Secondary | ICD-10-CM | POA: Diagnosis not present

## 2020-04-19 DIAGNOSIS — E890 Postprocedural hypothyroidism: Secondary | ICD-10-CM

## 2020-04-19 DIAGNOSIS — R6889 Other general symptoms and signs: Secondary | ICD-10-CM

## 2020-04-19 DIAGNOSIS — Z0000 Encounter for general adult medical examination without abnormal findings: Secondary | ICD-10-CM

## 2020-04-19 DIAGNOSIS — G43009 Migraine without aura, not intractable, without status migrainosus: Secondary | ICD-10-CM

## 2020-04-19 DIAGNOSIS — M26609 Unspecified temporomandibular joint disorder, unspecified side: Secondary | ICD-10-CM

## 2020-04-19 DIAGNOSIS — Z1159 Encounter for screening for other viral diseases: Secondary | ICD-10-CM | POA: Diagnosis not present

## 2020-04-19 DIAGNOSIS — E042 Nontoxic multinodular goiter: Secondary | ICD-10-CM

## 2020-04-19 DIAGNOSIS — Z9114 Patient's other noncompliance with medication regimen: Secondary | ICD-10-CM | POA: Diagnosis not present

## 2020-04-19 DIAGNOSIS — R7309 Other abnormal glucose: Secondary | ICD-10-CM | POA: Diagnosis not present

## 2020-04-19 DIAGNOSIS — Z6826 Body mass index (BMI) 26.0-26.9, adult: Secondary | ICD-10-CM | POA: Diagnosis not present

## 2020-04-19 DIAGNOSIS — R0989 Other specified symptoms and signs involving the circulatory and respiratory systems: Secondary | ICD-10-CM

## 2020-04-19 DIAGNOSIS — G44219 Episodic tension-type headache, not intractable: Secondary | ICD-10-CM

## 2020-04-19 DIAGNOSIS — G720 Drug-induced myopathy: Secondary | ICD-10-CM

## 2020-04-19 DIAGNOSIS — E782 Mixed hyperlipidemia: Secondary | ICD-10-CM | POA: Diagnosis not present

## 2020-04-19 DIAGNOSIS — Z0001 Encounter for general adult medical examination with abnormal findings: Secondary | ICD-10-CM

## 2020-04-19 DIAGNOSIS — H8113 Benign paroxysmal vertigo, bilateral: Secondary | ICD-10-CM

## 2020-04-19 DIAGNOSIS — F428 Other obsessive-compulsive disorder: Secondary | ICD-10-CM | POA: Diagnosis not present

## 2020-04-19 MED ORDER — BUTALBITAL-APAP-CAFFEINE 50-325-40 MG PO TABS: ORAL_TABLET | ORAL | 0 refills | Status: DC

## 2020-04-19 MED ORDER — NURTEC 75 MG PO TBDP: 1.0000 | ORAL_TABLET | Freq: Every day | ORAL | 0 refills | Status: DC | PRN

## 2020-04-19 NOTE — Patient Instructions (Addendum)
Ms. Laura Maynard , Thank you for taking time to come for your Medicare Wellness Visit. I appreciate your ongoing commitment to your health goals. Please review the following plan we discussed and let me know if I can assist you in the future.   These are the goals we discussed: Goals    . Exercise 150 min/wk Moderate Activity    . LDL CALC < 130       This is a list of the screening recommended for you and due dates:  Health Maintenance  Topic Date Due  .  Hepatitis C: One time screening is recommended by Center for Disease Control  (CDC) for  adults born from [birth year] through 1964.   Never done  . Cologuard (Stool DNA test)  02/18/2020  . DEXA scan (bone density measurement)  04/19/2021*  . Flu Shot  04/30/2020  . Mammogram  07/21/2020  . Tetanus Vaccine  10/15/2021  . COVID-19 Vaccine  Completed  . Pneumonia vaccines  Completed  *Topic was postponed. The date shown is not the original due date.     Consider adding citrucel/benefiber    High-Fiber Diet Fiber, also called dietary fiber, is a type of carbohydrate that is found in fruits, vegetables, whole grains, and beans. A high-fiber diet can have many health benefits. Your health care provider may recommend a high-fiber diet to help:  Prevent constipation. Fiber can make your bowel movements more regular.  Lower your cholesterol.  Relieve the following conditions: ? Swelling of veins in the anus (hemorrhoids). ? Swelling and irritation (inflammation) of specific areas of the digestive tract (uncomplicated diverticulosis). ? A problem of the large intestine (colon) that sometimes causes pain and diarrhea (irritable bowel syndrome, IBS).  Prevent overeating as part of a weight-loss plan.  Prevent heart disease, type 2 diabetes, and certain cancers. What is my plan? The recommended daily fiber intake in grams (g) includes:  38 g for men age 29 or younger.  30 g for men over age 37.  19 g for women age 15 or younger.  21 g  for women over age 18. You can get the recommended daily intake of dietary fiber by:  Eating a variety of fruits, vegetables, grains, and beans.  Taking a fiber supplement, if it is not possible to get enough fiber through your diet. What do I need to know about a high-fiber diet?  It is better to get fiber through food sources rather than from fiber supplements. There is not a lot of research about how effective supplements are.  Always check the fiber content on the nutrition facts label of any prepackaged food. Look for foods that contain 5 g of fiber or more per serving.  Talk with a diet and nutrition specialist (dietitian) if you have questions about specific foods that are recommended or not recommended for your medical condition, especially if those foods are not listed below.  Gradually increase how much fiber you consume. If you increase your intake of dietary fiber too quickly, you may have bloating, cramping, or gas.  Drink plenty of water. Water helps you to digest fiber. What are tips for following this plan?  Eat a wide variety of high-fiber foods.  Make sure that half of the grains that you eat each day are whole grains.  Eat breads and cereals that are made with whole-grain flour instead of refined flour or white flour.  Eat brown rice, bulgur wheat, or millet instead of white rice.  Start the day with  a breakfast that is high in fiber, such as a cereal that contains 5 g of fiber or more per serving.  Use beans in place of meat in soups, salads, and pasta dishes.  Eat high-fiber snacks, such as berries, raw vegetables, nuts, and popcorn.  Choose whole fruits and vegetables instead of processed forms like juice or sauce. What foods can I eat?  Fruits Berries. Pears. Apples. Oranges. Avocado. Prunes and raisins. Dried figs. Vegetables Sweet potatoes. Spinach. Kale. Artichokes. Cabbage. Broccoli. Cauliflower. Green peas. Carrots. Squash. Grains Whole-grain  breads. Multigrain cereal. Oats and oatmeal. Brown rice. Barley. Bulgur wheat. [place]. Quinoa. Bran muffins. Popcorn. Rye wafer crackers. Meats and other proteins Navy, kidney, and pinto beans. Soybeans. Split peas. Lentils. Nuts and seeds. Dairy Fiber-fortified yogurt. Beverages Fiber-fortified soy milk. Fiber-fortified orange juice. Other foods Fiber bars. The items listed above may not be a complete list of recommended foods and beverages. Contact a dietitian for more options. What foods are not recommended? Fruits Fruit juice. Cooked, strained fruit. Vegetables Fried potatoes. Canned vegetables. Well-cooked vegetables. Grains White bread. Pasta made with refined flour. White rice. Meats and other proteins Fatty cuts of meat. Fried chicken or fried fish. Dairy Milk. Yogurt. Cream cheese. Sour cream. Fats and oils Butters. Beverages Soft drinks. Other foods Cakes and pastries. The items listed above may not be a complete list of foods and beverages to avoid. Contact a dietitian for more information. Summary  Fiber is a type of carbohydrate. It is found in fruits, vegetables, whole grains, and beans.  There are many health benefits of eating a high-fiber diet, such as preventing constipation, lowering blood cholesterol, helping with weight loss, and reducing your risk of heart disease, diabetes, and certain cancers.  Gradually increase your intake of fiber. Increasing too fast can result in cramping, bloating, and gas. Drink plenty of water while you increase your fiber.  The best sources of fiber include whole fruits and vegetables, whole grains, nuts, seeds, and beans. This information is not intended to replace advice given to you by your health care provider. Make sure you discuss any questions you have with your health care provider. Document Revised: 07/21/2017 Document Reviewed: 07/21/2017 Elsevier Patient Education  [place] oral  dissolving tablet What is this medicine? RIMEGEPANT (ri ME je pant) is used to treat migraine headaches with or without aura. An aura is a strange feeling or visual disturbance that warns you of an attack. It is not used to prevent migraines. This medicine may be used for other purposes; ask your health care provider or pharmacist if you have questions. COMMON BRAND NAME(S): NURTEC ODT What should I tell my health care provider before I take this medicine? They need to know if you have any of these conditions:  kidney disease  liver disease  an unusual or allergic reaction to rimegepant, other medicines, foods, dyes, or preservatives  pregnant or trying to get pregnant  breast-feeding How should I use this medicine? Take the medicine by mouth. Follow the directions on the prescription label. Leave the tablet in the sealed blister pack until you are ready to take it. With dry hands, open the blister and gently remove the tablet. If the tablet breaks or crumbles, throw it away and take a new tablet out of the blister pack. Place the tablet in the mouth and allow it to dissolve, and then swallow. Do not cut, crush, or chew this medicine. You do not need water to  take this medicine. Talk to your pediatrician about the use of this medicine in children. Special care may be needed. Overdosage: If you think you have taken too much of this medicine contact a poison control center or emergency room at once. NOTE: This medicine is only for you. Do not share this medicine with others. What if I miss a dose? This does not apply. This medicine is not for regular use. What may interact with this medicine? This medicine may interact with the following medications:  certain medicines for fungal infections like fluconazole, itraconazole  rifampin This list may not describe all possible interactions. Give your health care provider a list of all the medicines, herbs, non-prescription drugs, or dietary  supplements you use. Also tell them if you smoke, drink alcohol, or use illegal drugs. Some items may interact with your medicine. What should I watch for while using this medicine? Visit your health care professional for regular checks on your progress. Tell your health care professional if your symptoms do not start to get better or if they get worse. What side effects may I notice from receiving this medicine? Side effects that you should report to your doctor or health care professional as soon as possible:  allergic reactions like skin rash, itching or hives; swelling of the face, lips, or tongue Side effects that usually do not require medical attention (report these to your doctor or health care professional if they continue or are bothersome):  nausea This list may not describe all possible side effects. Call your doctor for medical advice about side effects. You may report side effects to FDA at 1-800-FDA-1088. Where should I keep my medicine? Keep out of the reach of children. Store at room temperature between 15 and 30 degrees C (59 and 86 degrees F). Throw away any unused medicine after the expiration date. NOTE: This sheet is a summary. It may not cover all possible information. If you have questions about this medicine, talk to your doctor, pharmacist, or health care provider.  2020 Elsevier/Gold Standard (2018-11-30 00:21:31)

## 2020-04-20 LAB — LIPID PANEL
Cholesterol: 284 mg/dL — ABNORMAL HIGH (ref <200)
HDL: 104 mg/dL (ref >=50)
LDL Cholesterol (Calc): 164 mg/dL (calc) — ABNORMAL HIGH
Non-HDL Cholesterol (Calc): 180 mg/dL (calc) — ABNORMAL HIGH (ref <130)
Total CHOL/HDL Ratio: 2.7 (calc) (ref <5.0)
Triglycerides: 64 mg/dL (ref <150)

## 2020-04-20 LAB — COMPLETE METABOLIC PANEL WITH GFR
AG Ratio: 2 (calc) (ref 1.0–2.5)
ALT: 11 U/L (ref 6–29)
AST: 19 U/L (ref 10–35)
Albumin: 4.5 g/dL (ref 3.6–5.1)
Alkaline phosphatase (APISO): 98 U/L (ref 37–153)
BUN: 11 mg/dL (ref 7–25)
CO2: 28 mmol/L (ref 20–32)
Calcium: 9.3 mg/dL (ref 8.6–10.4)
Chloride: 108 mmol/L (ref 98–110)
Creat: 0.77 mg/dL (ref 0.60–0.93)
GFR, Est African American: 89 mL/min/{1.73_m2} (ref >=60)
GFR, Est Non African American: 77 mL/min/{1.73_m2} (ref >=60)
Globulin: 2.2 g/dL (calc) (ref 1.9–3.7)
Glucose, Bld: 84 mg/dL (ref 65–99)
Potassium: 4.3 mmol/L (ref 3.5–5.3)
Sodium: 140 mmol/L (ref 135–146)
Total Bilirubin: 0.3 mg/dL (ref 0.2–1.2)
Total Protein: 6.7 g/dL (ref 6.1–8.1)

## 2020-04-20 LAB — CBC WITH DIFFERENTIAL/PLATELET
Absolute Monocytes: 437 cells/uL (ref 200–950)
Basophils Absolute: 57 cells/uL (ref 0–200)
Basophils Relative: 1.1 %
Eosinophils Absolute: 57 cells/uL (ref 15–500)
Eosinophils Relative: 1.1 %
HCT: 35.6 % (ref 35.0–45.0)
Hemoglobin: 11.5 g/dL — ABNORMAL LOW (ref 11.7–15.5)
Lymphs Abs: 2059 cells/uL (ref 850–3900)
MCH: 27.6 pg (ref 27.0–33.0)
MCHC: 32.3 g/dL (ref 32.0–36.0)
MCV: 85.4 fL (ref 80.0–100.0)
MPV: 9.6 fL (ref 7.5–12.5)
Monocytes Relative: 8.4 %
Neutro Abs: 2590 cells/uL (ref 1500–7800)
Neutrophils Relative %: 49.8 %
Platelets: 381 10*3/uL (ref 140–400)
RBC: 4.17 10*6/uL (ref 3.80–5.10)
RDW: 13.1 % (ref 11.0–15.0)
Total Lymphocyte: 39.6 %
WBC: 5.2 10*3/uL (ref 3.8–10.8)

## 2020-04-20 LAB — MAGNESIUM: Magnesium: 2.2 mg/dL (ref 1.5–2.5)

## 2020-04-20 LAB — HEPATITIS C ANTIBODY
Hepatitis C Ab: NONREACTIVE
SIGNAL TO CUT-OFF: 0.01 (ref <1.00)

## 2020-04-21 LAB — COLOGUARD
COLOGUARD: NEGATIVE
Cologuard: NEGATIVE

## 2020-04-25 ENCOUNTER — Encounter: Payer: Self-pay | Admitting: Internal Medicine

## 2020-04-25 ENCOUNTER — Ambulatory Visit: Payer: Medicare PPO | Admitting: Internal Medicine

## 2020-04-25 ENCOUNTER — Encounter: Payer: Self-pay | Admitting: *Deleted

## 2020-05-17 ENCOUNTER — Ambulatory Visit: Payer: Medicare PPO | Admitting: Internal Medicine

## 2020-05-17 ENCOUNTER — Encounter: Payer: Self-pay | Admitting: Internal Medicine

## 2020-05-17 ENCOUNTER — Other Ambulatory Visit: Payer: Self-pay

## 2020-05-17 VITALS — BP 120/82 | HR 98 | Ht 65.25 in | Wt 172.0 lb

## 2020-05-17 DIAGNOSIS — E559 Vitamin D deficiency, unspecified: Secondary | ICD-10-CM

## 2020-05-17 DIAGNOSIS — E890 Postprocedural hypothyroidism: Secondary | ICD-10-CM | POA: Diagnosis not present

## 2020-05-17 DIAGNOSIS — E042 Nontoxic multinodular goiter: Secondary | ICD-10-CM

## 2020-05-17 NOTE — Patient Instructions (Signed)
Please return for labs.  Try to increase vit D to 1000 units alternating with 2000 units every other day.  Continue Levothyroxine 75 alternating with 50 mcg every other day.  Take the thyroid hormone every day, with water, at least 30 minutes before breakfast, separated by at least 4 hours from: - acid reflux medications - calcium - iron - multivitamins  Please come back for a follow-up appointment in 6 months.

## 2020-05-17 NOTE — Progress Notes (Addendum)
Patient ID: Laura Maynard, female   DOB: 08-10-1946, 74 y.o.   MRN: [id number]  This visit occurred during the SARS-CoV-2 public health emergency.  Safety protocols were in place, including screening questions prior to the visit, additional usage of staff PPE, and extensive cleaning of exam room while observing appropriate contact time as indicated for disinfecting solutions.   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 Last visit 6 months ago.  Reviewed and addended history: Pt had screening labs in 01/2014 and was found to have a low TSH. This was repeated 2x and the TSH continued to decrease.   Thyroid Uptake and scan (05/20/2014) >> uptake 48.5% and scan c/w TMNG.  We started MMI 5 mg bid.  Had RAI tx on 06/09/2014.   After RAI treatment, she developed post-ablative hypothyroidism >> we started levothyroxine 50 g daily. She developed leg cramps, which resolved after stopping levothyroxine but her TSH started to increase, so we change to Tirosint. Unfortunately, she could not afford this and we had to restart levothyroxine at the lower dose, subsequent to be increased back to 50 g daily (on 08/12/2016).  Her TSH became suppressed after increased the dose to 75 mcg daily.    She had caging with the 75 mcg tablets so we switched to only 50 mcg tablets, which are white.  She is tolerating this well.  She continues on levothyroxine 50 alternating with 75 mcg every other day: - in am - fasting - at least 1 hour from b'fast - no Ca, Fe, MVI, PPIs - not on Biotin  Reviewed her TFTs: Lab Results  Component Value Date   TSH 3.77 01/19/2020   TSH 3.41 10/26/2019   TSH 3.11 04/22/2019   TSH 2.79 10/29/2018   TSH 4.71 (H) 09/17/2018   TSH 1.93 06/15/2018   TSH 2.69 02/05/2018   TSH 0.75 10/14/2017   TSH 0.29 (L) 08/28/2017   TSH 0.16 (L) 06/23/2017   FREET4 0.96  10/26/2019   FREET4 0.89 04/22/2019   FREET4 0.95 10/29/2018   FREET4 0.95 10/14/2017   FREET4 1.14 08/28/2017   FREET4 1.11 06/23/2017   FREET4 0.91 04/28/2017   FREET4 0.87 10/02/2016   FREET4 0.65 08/12/2016   FREET4 0.44 (L) 06/26/2016    Pt denies: - feeling nodules in neck - hoarseness - dysphagia - choking - SOB with lying down  She complains of weight gain, however, per our records, there is no significant weight gain since last visit.  She also has a history of HTN, HL, anemia.  Vitamin D deficiency -severe:  Reviewed vitamin D levels: Lab Results  Component Value Date   VD25OH 22 (L) 01/19/2020   VD25OH 27.83 (L) 10/26/2019   VD25OH 24.26 (L) 04/22/2019   VD25OH 6 (L) 09/17/2018   VD25OH 12 (L) 02/05/2018   VD25OH 16.80 (L) 06/23/2017   VD25OH 23.88 (L) 04/28/2017   VD25OH 20 (L) 02/04/2017   VD25OH 8 (L) 11/05/2016   VD25OH 12 (L) 01/16/2016   She had very low vitamin D levels in the past; she was previously on 5000 units vitamin D daily but was not compliant with this due to headaches and itching.  She was on 1000 units daily at last visit (reduced dose due to concerns for constipation), and I advised her to increase to 2000 units daily. She is still on 1000 units daily.  She had steroid injections in the  knees in the past and will have another one next week.  ROS: Constitutional: + weight gain/no weight loss, no fatigue, no subjective hyperthermia, no subjective hypothermia Eyes: no blurry vision, no xerophthalmia ENT: no sore throat,  + see HPI Cardiovascular: no CP/no SOB/no palpitations/no leg swelling Respiratory: no cough/no SOB/no wheezing Gastrointestinal: no N/no V/no D/no C/no acid reflux Musculoskeletal: + muscle aches/+ joint aches - knees Skin: no rashes, no hair loss Neurological: no tremors/no numbness/no tingling/no dizziness  I reviewed pt's medications, allergies, PMH, social hx, family hx, and changes were documented in the history  of present illness. Otherwise, unchanged from my initial visit note.  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   High cholesterol    Hypertension    Hypothyroidism    Migraine    Prediabetes    Sarcoidosis, lung ([place]) 15 YRS AGO   NO TREATMENTS   Toxic multinodular goiter 05/27/2014   Vertigo    Vitamin D deficiency    Past Surgical History:  Procedure Laterality Date   BACK SURGERY  1970   CATARACT EXTRACTION Left 12/2017   COLONOSCOPY  2006; 03/28/11   2 small adenomas; normal   EYE SURGERY  1964   right eye   KNEE ARTHROSCOPY  2002   NASAL SEPTUM SURGERY  1998   TOTAL ABDOMINAL HYSTERECTOMY  2000   Social History   Socioeconomic History   Marital status: Divorced    Spouse name: Not on file   Number of children: 0   Years of education: college   Highest education level: Not on file  Occupational History    Comment: Retired  Tobacco Use   Smoking status: Never Smoker   Smokeless tobacco: Never Used  Substance and Sexual Activity   Alcohol use: Never    Alcohol/week: 1.0 standard drink    Types: 1 Standard drinks or equivalent per week    Comment: RARE   Drug use: Never   Sexual activity: Not on file  Other Topics Concern   Not on file  Social History Narrative   Patient is retired and lives at home alone. Patient  has college education.   Caffeine- 3 cups of caffeine daily.  One soda daily.   Right handed.   Social Determinants of Health   Financial Resource Strain:    Difficulty of Paying Living Expenses:   Food Insecurity:    Worried About Charity fundraiser in the Last Year:    Arboriculturist in the Last Year:   Transportation Needs:    Film/video editor (Medical):    Lack of Transportation (Non-Medical):   Physical Activity:    Days of Exercise per Week:    Minutes of Exercise per Session:   Stress:    Feeling of Stress :   Social  Connections:    Frequency of Communication with Friends and Family:    Frequency of Social Gatherings with Friends and Family:    Attends Religious Services:    Active Member of Clubs or Organizations:    Attends Music therapist:    Marital Status:   Intimate Partner Violence:    Fear of Current or Ex-Partner:    Emotionally Abused:    Physically Abused:    Sexually Abused:    Current Outpatient Medications on File Prior to Visit  Medication Sig Dispense Refill   Acetaminophen (TYLENOL) 325 MG CAPS as needed.  acetic acid-hydrocortisone (VOSOL-HC) OTIC solution Place 3 drops into both ears 2 (two) times daily as needed. As needed to ear canals after getting ears wet to prevent infection. 10 mL 1   butalbital-acetaminophen-caffeine (FIORICET) 50-325-40 MG tablet Take 1 tablet every 4 hours  if needed for Severe Headache 30 tablet 0   cetirizine-pseudoephedrine (ZYRTEC-D) 5-120 MG tablet Take 1 tablet by mouth 2 (two) times daily. (Patient not taking: Reported on 04/19/2020) 60 tablet 2   cholecalciferol (VITAMIN D3) 25 MCG (1000 UT) tablet Take 1,000 Units by mouth daily.     levothyroxine (SYNTHROID) 50 MCG tablet TAKE 1 AND 1/2 TABLETS BY MOUTH DAILY 150 tablet 3   naproxen sodium (ANAPROX) 220 MG tablet Take 440 mg by mouth 2 (two) times daily as needed (pain). (Patient not taking: Reported on 04/19/2020)     ondansetron (ZOFRAN) 8 MG tablet Take 1/2 to 1 tablet 3 x /day every 6 hours as needed for Nausea or Vomitting 30 tablet 0   Rimegepant Sulfate (NURTEC) 75 MG TBDP Take 1 tablet by mouth daily as needed. 2 tablet 0   No current facility-administered medications on file prior to visit.   Allergies  Allergen Reactions   Doxycycline Itching    itching   Penicillins Hives   Pravastatin     Myalgia   Sulfa Antibiotics Hives   Zetia [Ezetimibe]     Myalgia   Family History  Problem Relation Age of Onset   Lung cancer Sister 48    Throat cancer Brother 9   Stroke Mother    Emphysema Father    COPD Sister    Diabetes Brother    Colon cancer Neg Hx    PE: BP 120/82    Pulse 98    Ht 5' 5.25" (1.657 m)    Wt 172 lb (78 kg)    SpO2 97%    BMI 28.40 kg/m  Body mass index is 28.4 kg/m. Wt Readings from Last 3 Encounters:  05/17/20 172 lb (78 kg)  04/19/20 171 lb 6.4 oz (77.7 kg)  01/19/20 173 lb 3.2 oz (78.6 kg)   Constitutional: overweight, in NAD Eyes: PERRLA, EOMI, no exophthalmos ENT: moist mucous membranes, no thyromegaly, no cervical lymphadenopathy Cardiovascular: tachycardia, RR, No MRG Respiratory: CTA B Gastrointestinal: abdomen soft, NT, ND, BS+ Musculoskeletal: no deformities, strength intact in all 4 Skin: moist, warm, no rashes Neurological: no tremor with outstretched hands, DTR normal in all 4  ASSESSMENT: 1. Hypothyroidism after RAI tx  -In the past, she wanted to just see PCP for the hypothyroidism, but she returns 6 months ago and would like to continue to follow with me  2. MNG  3. vitamin D deficiency  PLAN:  1. And 2.  Patient with history of thyrotoxicosis due to toxic multinodular goiter, resolved after RAI treatment.  She developed post ablative hypothyroidism and started levothyroxine.  She initially had severe leg cramps with levothyroxine, but she was also found to have a very low vitamin D and was started on supplementation.  We did try Tirosint (liquid levothyroxine) but this was expensive for her.  She also developed itching on the 75 mcg levothyroxine tablet, so she is now taking only 50 mcg tablets, without coloring agents.  Her itching resolved on these. - latest thyroid labs reviewed with pt >> normal: Lab Results  Component Value Date   TSH 3.77 01/19/2020   - she continues on LT4 50 alternating with 75 mcg every other day - pt feels good  on this dose. - we discussed about taking the thyroid hormone every day, with water, >30 minutes before breakfast, separated by  >4 hours from acid reflux medications, calcium, iron, multivitamins. Pt. is taking it correctly. - I will see her back in 6 months, per her preference  3.  vitamin D deficiency  -She had a very low level of vitamin D in the past, and unfortunately, she could not tolerate 5000 units vitamin D daily due to constipation.  At last visit she was on 1000 units vitamin D daily. -At last visit, vitamin D level was close to normal at 27.8, and I advised him to try to add another 1000 unit-dose per day. -She had another level since then, and this was lower, at 22 -Recheck vitamin D level now, but I did advise her that we will need to increase the dose of her vitamin D -at least 1000 alternating with 2000 units every other day. She agrees to try this.  Component     Latest Ref Rng & Units 05/24/2020  Vitamin D, 25-Hydroxy     30.0 - 100.0 ng/mL 25.8 (L)  Vitamin D level is only slightly higher compared to before but still under our target.  We will advised her to alternate vitamin D 1000 to 2000 units every other day.  Philemon Kingdom, MD PhD [facility]

## 2020-05-22 ENCOUNTER — Other Ambulatory Visit: Payer: Self-pay | Admitting: Internal Medicine

## 2020-05-22 DIAGNOSIS — E559 Vitamin D deficiency, unspecified: Secondary | ICD-10-CM

## 2020-05-24 ENCOUNTER — Other Ambulatory Visit (INDEPENDENT_AMBULATORY_CARE_PROVIDER_SITE_OTHER): Payer: Medicare PPO

## 2020-05-24 ENCOUNTER — Other Ambulatory Visit: Payer: Self-pay

## 2020-05-24 DIAGNOSIS — M1712 Unilateral primary osteoarthritis, left knee: Secondary | ICD-10-CM | POA: Diagnosis not present

## 2020-05-24 DIAGNOSIS — M170 Bilateral primary osteoarthritis of knee: Secondary | ICD-10-CM | POA: Diagnosis not present

## 2020-05-24 DIAGNOSIS — M1711 Unilateral primary osteoarthritis, right knee: Secondary | ICD-10-CM | POA: Diagnosis not present

## 2020-05-24 DIAGNOSIS — E559 Vitamin D deficiency, unspecified: Secondary | ICD-10-CM | POA: Diagnosis not present

## 2020-05-25 LAB — VITAMIN D 25 HYDROXY (VIT D DEFICIENCY, FRACTURES): Vit D, 25-Hydroxy: 25.8 ng/mL — ABNORMAL LOW (ref 30.0–100.0)

## 2020-05-26 ENCOUNTER — Telehealth: Payer: Self-pay | Admitting: Internal Medicine

## 2020-05-26 NOTE — Telephone Encounter (Signed)
B/c thyroid test TSH was normal in 12/2019

## 2020-05-26 NOTE — Telephone Encounter (Signed)
Notified patient of results.  Patient is asking why she did not have orders for her thyroid?

## 2020-05-26 NOTE — Telephone Encounter (Signed)
Patient called asking if we had gotten her results back for her vitamin D. Ph# [phone]

## 2020-05-26 NOTE — Telephone Encounter (Signed)
-----   Message from Philemon Kingdom, MD sent at 05/25/2020  4:29 PM EDT ----- [name], can you please call pt:  Vitamin D level is only slightly higher compared to before but still under our target.  Please see if she can alternate vitamin D 1000 to 2000 units every other day (small increase in dose due to previous intolerance).

## 2020-06-01 ENCOUNTER — Telehealth: Payer: Self-pay | Admitting: Internal Medicine

## 2020-06-01 NOTE — Telephone Encounter (Signed)
Patient called stating she only had vitamin D done when she last had her labs done - she's wondering why she has not gotten any thyroid labs done being that she is seen here for thyroid. Please advise.

## 2020-06-01 NOTE — Telephone Encounter (Signed)
With normal, stable labs, she does not need repeat TFTs more frequently than every 6 to 12 months.  She can then done at the next visit with PCP.

## 2020-06-01 NOTE — Telephone Encounter (Signed)
We ended up not checking the thyroid tests and they were normal and stable in 12/2019.  I did discuss with her at the time of the visit that we will only check a vitamin D then.  I will repeat her TFTs at next visit.

## 2020-06-01 NOTE — Telephone Encounter (Signed)
Chart note reviewed from 05/17/2020:   - she continues on LT4 50 alternating with 75 mcg every other day - pt feels good on this dose. - we discussed about taking the thyroid hormone every day, with water, >30 minutes before breakfast, separated by >4 hours from acid reflux medications, calcium, iron, multivitamins. Pt. is taking it correctly. - will check thyroid tests: TSH and fT4 - If labs are abnormal, she will need to return for repeat TFTs in 1.5 months - OTW, I will see her back in 6 months, per her preference

## 2020-06-01 NOTE — Telephone Encounter (Signed)
Notified patient of below message.  Patient VERY upset and does not understand why Dr. Cruzita Lederer is using the thyroid tests from her PCP instead of doing them at the time of her visit which she said she specifically discussed with Dr, Cruzita Lederer and wanted to have them done. Patient said she does not understand or want to wait until her 6 month follow up for thyroid labs and then went on to tell me see has her wellness visit with her PCP next month and he will order thyroid labs so if that is the case then why is she coming her to have her thyroid managed id he is ordering the tests instead of [place]?  I attempted to explain that Dr. Cruzita Lederer only went by her PCP recent labs orders because they had already been done and are stable SO going forward we will be ordering her future thyroid labs.  Patient still does not clearly understand and is asked me to schedule her an appointment to have her labs done now but I let her know I would have to send the request to Dr. Cruzita Lederer.

## 2020-06-19 ENCOUNTER — Ambulatory Visit: Payer: Medicare PPO | Admitting: Adult Health

## 2020-06-19 DIAGNOSIS — M545 Low back pain: Secondary | ICD-10-CM | POA: Diagnosis not present

## 2020-06-26 ENCOUNTER — Ambulatory Visit (INDEPENDENT_AMBULATORY_CARE_PROVIDER_SITE_OTHER): Payer: Medicare PPO

## 2020-06-26 ENCOUNTER — Other Ambulatory Visit: Payer: Self-pay

## 2020-06-26 VITALS — Temp 98.6°F

## 2020-06-26 DIAGNOSIS — Z23 Encounter for immunization: Secondary | ICD-10-CM

## 2020-07-07 ENCOUNTER — Telehealth: Payer: Self-pay

## 2020-07-07 ENCOUNTER — Other Ambulatory Visit: Payer: Self-pay | Admitting: Adult Health

## 2020-07-07 DIAGNOSIS — G43009 Migraine without aura, not intractable, without status migrainosus: Secondary | ICD-10-CM

## 2020-07-07 MED ORDER — NURTEC 75 MG PO TBDP: ORAL_TABLET | ORAL | 2 refills | Status: DC

## 2020-07-07 NOTE — Telephone Encounter (Signed)
Patient was given a sample of Nurtec and seems to be working successfully. Requesting a prescription for this.

## 2020-07-10 NOTE — Telephone Encounter (Signed)
Spoke with patient to inform of message that was sent. I looked back on the phne/mychart message just to get an idea of what was going on.  Patient states that she was told on Sunday 3rd 2021 by pharmacy that a PA needed.  Locked out of cover My Meds at this so unable to look to see if it's in CoverMyMeds which I could have at least started today.  Was going to work on after the 4 PA's that I have.   Just to inform that you may need to address this tomorrow [name].....Marland Kitchensorry

## 2020-07-11 NOTE — Telephone Encounter (Signed)
PA response: Approved.

## 2020-07-11 NOTE — Telephone Encounter (Signed)
PA completed on Cover My Meds and just waiting on a response.

## 2020-07-13 NOTE — Progress Notes (Signed)
Assessment and Plan:  Laura Maynard was seen today for urinary frequency.  Diagnoses and all orders for this visit:  Dysuria/urinary frequency UTI versus OAB versus vaginal dryness - will check UA, C&S. ABX if indicated. Consider OAB meds, premarin topical.  -     Urinalysis w microscopic + reflex cultur  Insomnia, unspecified type I- good sleep hygiene discussed, increase day time activity -     gabapentin (NEURONTIN) 100 MG capsule; Take 1-3 capsules (100-300 mg total) by mouth at bedtime. For sleep and back pain.  Allergic rhinitis, unspecified seasonality, unspecified trigger Switch to plain allegra OTC, add topical steroid Discussed the importance of avoiding unnecessary antibiotic therapy. Suggested symptomatic OTC remedies. Follow up as needed. -     fluticasone (FLONASE) 50 MCG/ACT nasal spray; Place 1-2 sprays into both nostrils 2 (two) times daily as needed for allergies or rhinitis.  Further disposition pending results of labs. Discussed med's effects and SE's.   Over 30 minutes of exam, counseling, chart review, and critical decision making was performed.   Future Appointments  Date Time Provider [place]  07/20/2020  2:30 PM [name], MD [facility] None  11/17/2020  9:20 AM Philemon Kingdom, MD [facility] None  01/23/2021 10:00 AM [name], MD [facility] None  04/26/2021  9:00 AM Liane Comber, NP [facility] None    ------------------------------------------------------------------------------------------------------------------   HPI BP 122/72   Pulse 64   Temp (!) 96.6 F (35.9 C)   Wt 172 lb 9.6 oz (78.3 kg)   SpO2 98%   BMI 28.50 kg/m   74 y.o.female presents for evaluation of urinary frequency. Also c/o persistent sinus sx (chronic), insomnia for 3 weeks.   Reports urinary frequency with dysuria and urgency waxing and waning for 2 weeks She denies urine character changes, fever/chills, new back/flank pain, abdominal or pelvic pain.  Denies vaginal discharge, perineal burning. Denies hematuria. S/p hysterectomy 2000, not recently sexually active, denies hx of STIs.   She reports in the last 2-3 weeks she has noted she is having difficulty staying asleep, "can napping" and intermittent sleep only at night. Going to bed at 2030 and wakes up 0330. Denies naps during the day. Endorses some chronic/back pain but unsure if this is why she wakes up. Doesn't tolerate diphenhydramine.   Sinus congestion with year round allergies; taking zyrtec D PRN but doesn't feel this is working. Denies HA, fever/chills.   Past Medical History:  Diagnosis Date  . Blind right eye    legally  . 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 High cholesterol   . Hypertension   . Hypothyroidism   . Migraine   . Prediabetes   . Sarcoidosis, lung ([place]) 15 YRS AGO   NO TREATMENTS  . 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 Vertigo   . Vitamin D deficiency      Allergies  Allergen Reactions  . Doxycycline Itching    itching  . Penicillins Hives  . Pravastatin     Myalgia  . Sulfa Antibiotics Hives  . Zetia [Ezetimibe]     Myalgia    Current Outpatient Medications on File Prior to Visit  Medication Sig  . acetic acid-hydrocortisone (VOSOL-HC) OTIC solution Place 3 drops into both ears 2 (two) times daily as needed. As needed to ear canals after getting ears wet to prevent infection.  . butalbital-acetaminophen-caffeine (FIORICET) 50-325-40 MG tablet Take 1 tablet every 4 hours  if needed for Severe Headache  . cetirizine-pseudoephedrine (ZYRTEC-D) 5-120 MG tablet Take 1 tablet by mouth 2 (  two) times daily.  . cholecalciferol (VITAMIN D3) 25 MCG (1000 UT) tablet Take 1,000 Units by mouth daily.  Marland Kitchen levothyroxine (SYNTHROID) 50 MCG tablet TAKE 1 AND 1/2 TABLETS BY MOUTH DAILY  . ondansetron (ZOFRAN) 8 MG tablet Take 1/2 to 1 tablet 3 x /day every 6 hours as needed for Nausea or Vomitting  . Rimegepant Sulfate (NURTEC) 75 MG TBDP Take 1 tab as  needed with onset of migraine symptoms, up to 15 times in a month.  . Acetaminophen (TYLENOL) 325 MG CAPS as needed.  (Patient not taking: Reported on 07/14/2020)  . naproxen sodium (ANAPROX) 220 MG tablet Take 440 mg by mouth 2 (two) times daily as needed (pain).  (Patient not taking: Reported on 07/14/2020)   No current facility-administered medications on file prior to visit.    ROS: all negative except above.   Physical Exam:  BP 122/72   Pulse 64   Temp (!) 96.6 F (35.9 C)   Wt 172 lb 9.6 oz (78.3 kg)   SpO2 98%   BMI 28.50 kg/m   General Appearance: Well nourished, in no apparent distress. Eyes: PERRLA, EOMs, conjunctiva no swelling or erythema Sinuses: No Frontal/maxillary tenderness ENT/Mouth: Ext aud canals clear, TMs without erythema, bulging. No erythema, swelling, or exudate on post pharynx.  Tonsils not swollen or erythematous. Hearing normal.  Neck: Supple, thyroid normal.  Respiratory: Respiratory effort normal, BS equal bilaterally without rales, rhonchi, wheezing or stridor.  Cardio: RRR with no MRGs. Brisk peripheral pulses without edema.  Abdomen: Soft, + BS.  Non tender, no guarding, rebound, hernias, masses. Lymphatics: Non tender without lymphadenopathy.  Musculoskeletal: Full ROM, 5/5 strength, normal gait.  Skin: Warm, dry without rashes, lesions, ecchymosis.  Neuro: Cranial nerves intact. Normal muscle tone, no cerebellar symptoms. Sensation intact.  Psych: Awake and oriented X 3, normal affect, Insight and Judgment appropriate.     Izora Ribas, NP 12:19 PM [facility]

## 2020-07-14 ENCOUNTER — Other Ambulatory Visit: Payer: Self-pay

## 2020-07-14 ENCOUNTER — Other Ambulatory Visit: Payer: Self-pay | Admitting: Adult Health

## 2020-07-14 ENCOUNTER — Encounter: Payer: Self-pay | Admitting: Adult Health

## 2020-07-14 ENCOUNTER — Ambulatory Visit: Payer: Medicare PPO | Admitting: Adult Health

## 2020-07-14 VITALS — BP 122/72 | HR 64 | Temp 96.6°F | Wt 172.6 lb

## 2020-07-14 DIAGNOSIS — R300 Dysuria: Secondary | ICD-10-CM

## 2020-07-14 DIAGNOSIS — G4700 Insomnia, unspecified: Secondary | ICD-10-CM | POA: Diagnosis not present

## 2020-07-14 DIAGNOSIS — J309 Allergic rhinitis, unspecified: Secondary | ICD-10-CM

## 2020-07-14 MED ORDER — FLUTICASONE PROPIONATE 50 MCG/ACT NA SUSP: 1.0000 | Freq: Two times a day (BID) | NASAL | 1 refills | Status: DC | PRN

## 2020-07-14 MED ORDER — GABAPENTIN 100 MG PO CAPS: 100.0000 mg | ORAL_CAPSULE | Freq: Every day | ORAL | 0 refills | Status: DC

## 2020-07-14 NOTE — Patient Instructions (Addendum)
Zyrtec or switch to allegra plain   Then do flonase, saline irrigatoins or other medicatoins below Limit sudafed   Medicines you can use  Nasal congestion  Little Remedies saline spray (aerosol/mist)- can try this, it is in the kids section - pseudoephedrine (Sudafed)- behind the counter, do not use if you have high blood pressure, medicine that have -D in them.  - phenylephrine (Sudafed PE) -Dextormethorphan + chlorpheniramine (Coridcidin HBP)- okay if you have high blood pressure -Oxymetazoline (Afrin) nasal spray- LIMIT to 3 days -Saline nasal spray -Neti pot (used distilled or bottled water)  Ear pain/congestion  -pseudoephedrine (sudafed) - Nasonex/flonase nasal spray        Gabapentin capsules or tablets What is this medicine? GABAPENTIN (GA ba pen tin) is used to control seizures in certain types of epilepsy. It is also used to treat certain types of nerve pain. This medicine may be used for other purposes; ask your health care provider or pharmacist if you have questions. COMMON BRAND NAME(S): Active-PAC with Gabapentin, Gabarone, Neurontin What should I tell my health care provider before I take this medicine? They need to know if you have any of these conditions:  history of drug abuse or alcohol abuse problem  kidney disease  lung or breathing disease  suicidal thoughts, plans, or attempt; a previous suicide attempt by you or a family member  an unusual or allergic reaction to gabapentin, other medicines, foods, dyes, or preservatives  pregnant or trying to get pregnant  breast-feeding How should I use this medicine? Take this medicine by mouth with a glass of water. Follow the directions on the prescription label. You can take it with or without food. If it upsets your stomach, take it with food. Take your medicine at regular intervals. Do not take it more often than directed. Do not stop taking except on your doctor's advice. If you are directed  to break the 600 or 800 mg tablets in half as part of your dose, the extra half tablet should be used for the next dose. If you have not used the extra half tablet within 28 days, it should be thrown away. A special MedGuide will be given to you by the pharmacist with each prescription and refill. Be sure to read this information carefully each time. Talk to your pediatrician regarding the use of this medicine in children. While this drug may be prescribed for children as young as 3 years for selected conditions, precautions do apply. Overdosage: If you think you have taken too much of this medicine contact a poison control center or emergency room at once. NOTE: This medicine is only for you. Do not share this medicine with others. What if I miss a dose? If you miss a dose, take it as soon as you can. If it is almost time for your next dose, take only that dose. Do not take double or extra doses. What may interact with this medicine? This medicine may interact with the following medications:  alcohol  antihistamines for allergy, cough, and cold  certain medicines for anxiety or sleep  certain medicines for depression like amitriptyline, fluoxetine, sertraline  certain medicines for seizures like phenobarbital, primidone  certain medicines for stomach problems  general anesthetics like halothane, isoflurane, methoxyflurane, propofol  local anesthetics like lidocaine, pramoxine, tetracaine  medicines that relax muscles for surgery  narcotic medicines for pain  phenothiazines like chlorpromazine, mesoridazine, prochlorperazine, thioridazine This list may not describe all possible interactions. Give your health  care provider a list of all the medicines, herbs, non-prescription drugs, or dietary supplements you use. Also tell them if you smoke, drink alcohol, or use illegal drugs. Some items may interact with your medicine. What should I watch for while using this medicine? Visit your  doctor or health care provider for regular checks on your progress. You may want to keep a record at home of how you feel your condition is responding to treatment. You may want to share this information with your doctor or health care provider at each visit. You should contact your doctor or health care provider if your seizures get worse or if you have any new types of seizures. Do not stop taking this medicine or any of your seizure medicines unless instructed by your doctor or health care provider. Stopping your medicine suddenly can increase your seizures or their severity. This medicine may cause serious skin reactions. They can happen weeks to months after starting the medicine. Contact your health care provider right away if you notice fevers or flu-like symptoms with a rash. The rash may be red or purple and then turn into blisters or peeling of the skin. Or, you might notice a red rash with swelling of the face, lips or lymph nodes in your neck or under your arms. Wear a medical identification bracelet or chain if you are taking this medicine for seizures, and carry a card that lists all your medications. You may get drowsy, dizzy, or have blurred vision. Do not drive, use machinery, or do anything that needs mental alertness until you know how this medicine affects you. To reduce dizzy or fainting spells, do not sit or stand up quickly, especially if you are an older patient. Alcohol can increase drowsiness and dizziness. Avoid alcoholic drinks. Your mouth may get dry. Chewing sugarless gum or sucking hard candy, and drinking plenty of water will help. The use of this medicine may increase the chance of suicidal thoughts or actions. Pay special attention to how you are responding while on this medicine. Any worsening of mood, or thoughts of suicide or dying should be reported to your health care provider right away. Women who become pregnant while using this medicine may enroll in the [place] Pregnancy Registry by calling [phone]. This registry collects information about the safety of antiepileptic drug use during pregnancy. What side effects may I notice from receiving this medicine? Side effects that you should report to your doctor or health care professional as soon as possible:  allergic reactions like skin rash, itching or hives, swelling of the face, lips, or tongue  breathing problems  rash, fever, and swollen lymph nodes  redness, blistering, peeling or loosening of the skin, including inside the mouth  suicidal thoughts, mood changes Side effects that usually do not require medical attention (report to your doctor or health care professional if they continue or are bothersome):  dizziness  drowsiness  headache  nausea, vomiting  swelling of ankles, feet, hands  tiredness This list may not describe all possible side effects. Call your doctor for medical advice about side effects. You may report side effects to FDA at 1-800-FDA-1088. Where should I keep my medicine? Keep out of reach of children. This medicine may cause accidental overdose and death if it taken by other adults, children, or pets. Mix any unused medicine with a substance like cat litter or coffee grounds. Then throw the medicine away in a sealed container like a sealed bag or a  coffee can with a lid. Do not use the medicine after the expiration date. Store at room temperature between 15 and 30 degrees C (59 and 86 degrees F). NOTE: This sheet is a summary. It may not cover all possible information. If you have questions about this medicine, talk to your doctor, pharmacist, or health care provider.  2020 Elsevier/Gold Standard (2018-12-18 14:16:43)

## 2020-07-15 LAB — URINALYSIS W MICROSCOPIC + REFLEX CULTURE
Bacteria, UA: NONE SEEN /HPF
Bilirubin Urine: NEGATIVE
Glucose, UA: NEGATIVE
Hyaline Cast: NONE SEEN /LPF
Ketones, ur: NEGATIVE
Leukocyte Esterase: NEGATIVE
Nitrites, Initial: NEGATIVE
Protein, ur: NEGATIVE
Specific Gravity, Urine: 1.015 (ref 1.001–1.03)
Squamous Epithelial / HPF: NONE SEEN /HPF (ref <=5)
WBC, UA: NONE SEEN /HPF (ref 0–5)
pH: <=5 (ref 5.0–8.0)

## 2020-07-15 LAB — NO CULTURE INDICATED

## 2020-07-16 ENCOUNTER — Ambulatory Visit
Admission: EM | Admit: 2020-07-16 | Discharge: 2020-07-16 | Disposition: A | Discharge status: Home / Self Care | Payer: Medicare PPO | Attending: Physician Assistant | Admitting: Physician Assistant

## 2020-07-16 ENCOUNTER — Other Ambulatory Visit: Payer: Self-pay

## 2020-07-16 DIAGNOSIS — R519 Headache, unspecified: Secondary | ICD-10-CM | POA: Diagnosis not present

## 2020-07-16 MED ORDER — METOCLOPRAMIDE HCL 5 MG/ML IJ SOLN
10.0000 mg | Freq: Once | INTRAMUSCULAR | Status: AC
  Administered 2020-07-16: 10 mg via INTRAMUSCULAR

## 2020-07-16 MED ORDER — KETOROLAC TROMETHAMINE 15 MG/ML IJ SOLN
15.0000 mg | Freq: Once | INTRAMUSCULAR | Status: AC
  Administered 2020-07-16: 15 mg via INTRAMUSCULAR

## 2020-07-16 NOTE — Discharge Instructions (Signed)
Toradol, reglan injection in office today. Please go to the ED if symptoms not improving.

## 2020-07-16 NOTE — ED Provider Notes (Signed)
[facility]    CSN: [id number] Arrival date & time: 07/16/20  0830      History   Chief Complaint Chief Complaint  Patient presents with   Headache    since yesterday   Nausea    since yesterday   Emesis    since yesterday    HPI Laura Maynard is a 74 y.o. female.   74 year old female with history of HTN, HLD, hypothyroidism, migraines comes in for acute onset of headache, nausea/vomiting last night. States took gabapentin, and symptom started 26mins after. Since then, has had multiple NBNB vomiting and cannot tolerate oral intake. Headache without vision changes, photophobia. States similar to her migraines. Denies head injury, loss of consciousness. Denies weakness, dizziness, syncope. Denies abdominal pain, fever.      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   High cholesterol    Hypertension    Hypothyroidism    Migraine    Prediabetes    Sarcoidosis, lung ([place]) 15 YRS AGO   NO TREATMENTS   Toxic multinodular goiter 05/27/2014   Vertigo    Vitamin D deficiency     Patient Active Problem List   Diagnosis Date Noted   Insomnia 07/14/2020   Migraine without aura and without status migrainosus, not intractable 04/19/2020   Statin myopathy 04/17/2020   Abnormal glucose 09/17/2018   FH: hypertension 06/14/2018   Multinodular goiter 08/28/2017   Medication Phobia 02/04/2017   BMI 26.0-26.9,adult 07/27/2015   Postablative hypothyroidism 07/06/2015   Benign paroxysmal positional vertigo 09/14/2014   TMJ (temporomandibular joint syndrome) 09/14/2014   Poor compliance with medication 05/29/2014   Hyperlipidemia, mixed 02/08/2014   Labile hypertension    History of prediabetes    Vitamin D deficiency     Past Surgical History:  Procedure Laterality Date   BACK SURGERY  1970   CATARACT EXTRACTION Left 12/2017   COLONOSCOPY  2006; 03/28/11   2  small adenomas; normal   EYE SURGERY  1964   right eye   KNEE ARTHROSCOPY  2002   [place]   TOTAL ABDOMINAL HYSTERECTOMY  2000    OB History   No obstetric history on file.      Home Medications    Prior to Admission medications   Medication Sig Start Date End Date Taking? Authorizing Provider  cetirizine-pseudoephedrine (ZYRTEC-D) 5-120 MG tablet Take 1 tablet by mouth 2 (two) times daily. 12/29/18  Yes Liane Comber, NP  cholecalciferol (VITAMIN D3) 25 MCG (1000 UT) tablet Take 1,000 Units by mouth daily.   Yes [provider]  fluticasone (FLONASE) 50 MCG/ACT nasal spray INSTILL 1-2 SPRAYS INTO BOTH NOSTRILS TWICE DAILY AS NEEDED FOR ALLERGIES 07/14/20  Yes [name], MD  gabapentin (NEURONTIN) 100 MG capsule Take 1-3 capsules (100-300 mg total) by mouth at bedtime. For sleep and back pain. 07/14/20 07/14/21 Yes Liane Comber, NP  levothyroxine (SYNTHROID) 50 MCG tablet TAKE 1 AND 1/2 TABLETS BY MOUTH DAILY 01/18/20  Yes Philemon Kingdom, MD  naproxen sodium (ANAPROX) 220 MG tablet Take 440 mg by mouth 2 (two) times daily as needed (pain).    Yes [provider]  ondansetron (ZOFRAN) 8 MG tablet Take 1/2 to 1 tablet 3 x /day every 6 hours as needed for Nausea or Vomitting 03/09/20  Yes [name], MD  Acetaminophen (TYLENOL) 325 MG CAPS as needed.  Patient not taking: Reported on  07/14/2020    [provider]  acetic acid-hydrocortisone (VOSOL-HC) OTIC solution Place 3 drops into both ears 2 (two) times daily as needed. As needed to ear canals after getting ears wet to prevent infection. 03/03/19   Liane Comber, NP  butalbital-acetaminophen-caffeine (FIORICET) [phone] MG tablet Take 1 tablet every 4 hours  if needed for Severe Headache 04/19/20   Liane Comber, NP  Rimegepant Sulfate (NURTEC) 75 MG TBDP Take 1 tab as needed with onset of migraine symptoms, up to 15 times in a month. 07/07/20   Liane Comber, NP     Family History Family History  Problem Relation Age of Onset   Lung cancer Sister 83   Throat cancer Brother 4   Stroke Mother    Emphysema Father    COPD Sister    Diabetes Brother    Colon cancer Neg Hx     Social History Social History   Tobacco Use   Smoking status: Never Smoker   Smokeless tobacco: Never Used  Scientific laboratory technician Use: Never used  Substance Use Topics   Alcohol use: Never    Alcohol/week: 1.0 standard drink    Types: 1 Standard drinks or equivalent per week    Comment: RARE   Drug use: Never     Allergies   Doxycycline, Penicillins, Pravastatin, Sulfa antibiotics, and Zetia [ezetimibe]   Review of Systems Review of Systems  Reason unable to perform ROS: See HPI as above.     Physical Exam Triage Vital Signs ED Triage Vitals  Enc Vitals Group     BP 07/16/20 0840 131/84     Pulse Rate 07/16/20 0840 93     Resp 07/16/20 0840 18     Temp 07/16/20 0840 98 F (36.7 C)     Temp Source 07/16/20 0840 Oral     SpO2 07/16/20 0840 98 %     Weight --      Height --      Head Circumference --      Peak Flow --      Pain Score 07/16/20 0849 9     Pain Loc --      Pain Edu? --      Excl. in [place]? --    No data found.  Updated Vital Signs BP 131/84 (BP Location: Left Arm)    Pulse 93    Temp 98 F (36.7 C) (Oral)    Resp 18    SpO2 98%   Physical Exam Constitutional:      General: She is not in acute distress.    Appearance: Normal appearance. She is well-developed. She is not toxic-appearing or diaphoretic.  HENT:     Head: Normocephalic and atraumatic.  Eyes:     Conjunctiva/sclera: Conjunctivae normal.     Pupils: Pupils are equal, round, and reactive to light.  Cardiovascular:     Rate and Rhythm: Normal rate and regular rhythm.  Pulmonary:     Effort: Pulmonary effort is normal. No respiratory distress.     Comments: LCTAB Musculoskeletal:     Cervical back: Normal range of motion and neck supple.  Skin:     General: Skin is warm and dry.  Neurological:     Mental Status: She is alert and oriented to person, place, and time.     Comments: Grossly intake. Facial symmetry intact. Strength 5/5 BUE/BLE. Able to ambulate on own without difficulty.       UC Treatments / Results  Labs (all  labs ordered are listed, but only abnormal results are displayed) Labs Reviewed - No data to display  EKG   Radiology No results found.  Procedures Procedures (including critical care time)  Medications Ordered in UC Medications  ketorolac (TORADOL) 15 MG/ML injection 15 mg (has no administration in time range)  metoCLOPramide (REGLAN) injection 10 mg (10 mg Intramuscular Given 07/16/20 0904)    Initial Impression / Assessment and Plan / UC Course  I have reviewed the triage vital signs and the nursing notes.  Pertinent labs & imaging results that were available during my care of the patient were reviewed by me and considered in my medical decision making (see chart for details).    Patient states zofran did not help in the past. Will provide reglan injection in office today and monitor.   Rechecked on patient. She was sitting comfortably on exam table. However, when asked how she is doing, patient started crying saying she would like something for her headache. When asked about nausea, stated she felt the same. Discussed worries for uncontrolled nausea/vomiting, and may need ED evaluation. Patient states would just like medicine for headache. Will provide toradol injection and have patient continue to monitor. Low threshold for ED visit. Return precautions given.   Final Clinical Impressions(s) / UC Diagnoses   Final diagnoses:  Acute intractable headache, unspecified headache type   ED Prescriptions    None     PDMP not reviewed this encounter.   Ok Edwards, PA-C 07/16/20 [phone]

## 2020-07-16 NOTE — ED Triage Notes (Signed)
Pt states she took a new medicine last night and approximately 90 minutes later she began having a severe headache, nausea, vomiting, and frequent stools. Pt is aox4 and ambulatory.

## 2020-07-20 ENCOUNTER — Ambulatory Visit: Payer: Medicare PPO | Admitting: Internal Medicine

## 2020-07-20 ENCOUNTER — Other Ambulatory Visit: Payer: Self-pay | Admitting: Internal Medicine

## 2020-07-20 ENCOUNTER — Other Ambulatory Visit: Payer: Self-pay

## 2020-07-20 VITALS — BP 108/72 | HR 84 | Temp 97.4°F | Resp 16 | Ht 66.0 in | Wt 173.4 lb

## 2020-07-20 DIAGNOSIS — R0989 Other specified symptoms and signs involving the circulatory and respiratory systems: Secondary | ICD-10-CM | POA: Diagnosis not present

## 2020-07-20 DIAGNOSIS — E782 Mixed hyperlipidemia: Secondary | ICD-10-CM

## 2020-07-20 DIAGNOSIS — E559 Vitamin D deficiency, unspecified: Secondary | ICD-10-CM | POA: Diagnosis not present

## 2020-07-20 DIAGNOSIS — R7309 Other abnormal glucose: Secondary | ICD-10-CM | POA: Diagnosis not present

## 2020-07-20 DIAGNOSIS — E890 Postprocedural hypothyroidism: Secondary | ICD-10-CM | POA: Diagnosis not present

## 2020-07-20 DIAGNOSIS — Z79899 Other long term (current) drug therapy: Secondary | ICD-10-CM

## 2020-07-20 NOTE — Patient Instructions (Signed)
Due to recent changes in healthcare laws, you may see the results of your imaging and laboratory studies on MyChart before your provider has had a chance to review them.  We understand that in some cases there may be results that are confusing or concerning to you. Not all laboratory results come back in the same time frame and the provider may be waiting for multiple results in order to interpret others.  Please give us 48 hours in order for your provider to thoroughly review all the results before contacting the office for clarification of your results.     ++++++++++++++++++++++++++++++++++  Vit D  & Vit C 1,000 mg   are recommended to help protect  against the Covid-19 and other Corona viruses.    Also it's recommended  to take  Zinc 50 mg  to help  protect against the Covid-19   and best place to get  is also on [employer]  and don't pay more than 6-8 cents /pill !   ===================================== Coronavirus (COVID-19) Are you at risk?  Are you at risk for the Coronavirus (COVID-19)?  To be considered HIGH RISK for Coronavirus (COVID-19), you have to meet the following criteria:  . Traveled to [place], [place], [place], [place] or [place]; or in the [place] to [place], [place], [place]  . or [place]; and have fever, cough, and shortness of breath within the last 2 weeks of travel OR . Been in close contact with a person diagnosed with COVID-19 within the last 2 weeks and have  . fever, cough,and shortness of breath .  . IF YOU DO NOT MEET THESE CRITERIA, YOU ARE CONSIDERED LOW RISK FOR COVID-19.  What to do if you are HIGH RISK for COVID-19?  . If you are having a medical emergency, call 911. . Seek medical care right away. Before you go to a doctor's office, urgent care or emergency department, .  call ahead and tell them about your recent travel, contact with someone diagnosed with COVID-19  .  and your symptoms.  . You should receive instructions  from your physician's office regarding next steps of care.  . When you arrive at healthcare provider, tell the healthcare staff immediately you have returned from  . visiting [place], [place], [place], [place] or [place]; or traveled in the [place] to [place], [place],  . [place] or [place] in the last two weeks or you have been in close contact with a person diagnosed with  . COVID-19 in the last 2 weeks.   . Tell the health care staff about your symptoms: fever, cough and shortness of breath. . After you have been seen by a medical provider, you will be either: o Tested for (COVID-19) and discharged home on quarantine except to seek medical care if  o symptoms worsen, and asked to  - Stay home and avoid contact with others until you get your results (4-5 days)  - Avoid travel on public transportation if possible (such as bus, train, or airplane) or o Sent to the Emergency Department by EMS for evaluation, COVID-19 testing  and  o possible admission depending on your condition and test results.  What to do if you are LOW RISK for COVID-19?  Reduce your risk of any infection by using the same precautions used for avoiding the common cold or flu:  . Wash your hands often with soap and warm water for at least 20 seconds.  If soap and water   are not readily available,  . use an alcohol-based hand sanitizer with at least 60% alcohol.  . If coughing or sneezing, cover your mouth and nose by coughing or sneezing into the elbow areas of your shirt or coat, .  into a tissue or into your sleeve (not your hands). . Avoid shaking hands with others and consider head nods or verbal greetings only. . Avoid touching your eyes, nose, or mouth with unwashed hands.  . Avoid close contact with people who are sick. . Avoid places or events with large numbers of people in one location, like concerts or sporting events. . Carefully consider travel plans you have or are making. . If you are planning  any travel outside or inside the [place], visit the CDC's Travelers' Health webpage for the latest health notices. . If you have some symptoms but not all symptoms, continue to monitor at home and seek medical attention  . if your symptoms worsen. . If you are having a medical emergency, call 911.   ++++++++++++++++++++++++++++++++ Recommend Adult Low Dose Aspirin or  coated  Aspirin 81 mg daily  To reduce risk of Colon Cancer 40 %,  Skin Cancer 26 % ,  Melanoma 46%  and  Pancreatic cancer 60% ++++++++++++++++++++++++++++++++ Vitamin D goal  is between 70-100.  Please make sure that you are taking your Vitamin D as directed.  It is very important as a natural anti-inflammatory  helping hair, skin, and nails, as well as reducing stroke and heart attack risk.  It helps your bones and helps with mood. It also decreases numerous cancer risks so please take it as directed.  Low Vit D is associated with a 200-300% higher risk for CANCER  and 200-300% higher risk for HEART   ATTACK  &  STROKE.   ...................................... It is also associated with higher death rate at younger ages,  autoimmune diseases like Rheumatoid arthritis, Lupus, Multiple Sclerosis.    Also many other serious conditions, like depression, Alzheimer's Dementia, infertility, muscle aches, fatigue, fibromyalgia - just to name a few. ++++++++++++++++++++ Recommend the book "The END of DIETING" by Dr Joel Fuhrman  & the book "The END of DIABETES " by Dr Joel Fuhrman At [employer] - get book & Audio CD's    Being diabetic has a  300% increased risk for heart attack, stroke, cancer, and alzheimer- type vascular dementia. It is very important that you work harder with diet by avoiding all foods that are white. Avoid white rice (brown & wild rice is OK), white potatoes (sweetpotatoes in moderation is OK), White bread or wheat bread or anything made out of white flour like bagels, donuts, rolls, buns, biscuits, cakes,  pastries, cookies, pizza crust, and pasta (made from white flour & egg whites) - vegetarian pasta or spinach or wheat pasta is OK. Multigrain breads like Arnold's or Pepper[employer], or multigrain sandwich thins or flatbreads.  Diet, exercise and weight loss can reverse and cure diabetes in the early stages.  Diet, exercise and weight loss is very important in the control and prevention of complications of diabetes which affects every system in your body, ie. Brain - dementia/stroke, eyes - glaucoma/blindness, heart - heart attack/heart failure, kidneys - dialysis, stomach - gastric paralysis, intestines - malabsorption, nerves - severe painful neuritis, circulation - gangrene & loss of a leg(s), and finally cancer and Alzheimers.    I recommend avoid fried & greasy foods,  sweets/candy, white rice (brown or wild rice or Quinoa is OK), white potatoes (  sweet potatoes are OK) - anything made from white flour - bagels, doughnuts, rolls, buns, biscuits,white and wheat breads, pizza crust and traditional pasta made of white flour & egg white(vegetarian pasta or spinach or wheat pasta is OK).  Multi-grain bread is OK - like multi-grain flat bread or sandwich thins. Avoid alcohol in excess. Exercise is also important.    Eat all the vegetables you want - avoid meat, especially red meat and dairy - especially cheese.  Cheese is the most concentrated form of trans-fats which is the worst thing to clog up our arteries. Veggie cheese is OK which can be found in the fresh produce section at [employer] or [employer] or [employer]  +++++++++++++++++++++ DASH Eating Plan  DASH stands for "Dietary Approaches to Stop Hypertension."   The DASH eating plan is a healthy eating plan that has been shown to reduce high blood pressure (hypertension). Additional health benefits may include reducing the risk of type 2 diabetes mellitus, heart disease, and stroke. The DASH eating plan may also help with weight loss. WHAT DO I  NEED TO KNOW ABOUT THE DASH EATING PLAN? For the DASH eating plan, you will follow these general guidelines:  Choose foods with a percent daily value for sodium of less than 5% (as listed on the food label).  Use salt-free seasonings or herbs instead of table salt or sea salt.  Check with your health care provider or pharmacist before using salt substitutes.  Eat lower-sodium products, often labeled as "lower sodium" or "no salt added."  Eat fresh foods.  Eat more vegetables, fruits, and low-fat dairy products.  Choose whole grains. Look for the word "whole" as the first word in the ingredient list.  Choose fish   Limit sweets, desserts, sugars, and sugary drinks.  Choose heart-healthy fats.  Eat veggie cheese   Eat more home-cooked food and less restaurant, buffet, and fast food.  Limit fried foods.  Cook foods using methods other than frying.  Limit canned vegetables. If you do use them, rinse them well to decrease the sodium.  When eating at a restaurant, ask that your food be prepared with less salt, or no salt if possible.                      WHAT FOODS CAN I EAT? Read Dr Joel Fuhrman's books on The End of Dieting & The End of Diabetes  Grains Whole grain or whole wheat bread. Brown rice. Whole grain or whole wheat pasta. Quinoa, bulgur, and whole grain cereals. Low-sodium cereals. Corn or whole wheat flour tortillas. Whole grain cornbread. Whole grain crackers. Low-sodium crackers.  Vegetables Fresh or frozen vegetables (raw, steamed, roasted, or grilled). Low-sodium or reduced-sodium tomato and vegetable juices. Low-sodium or reduced-sodium tomato sauce and paste. Low-sodium or reduced-sodium canned vegetables.   Fruits All fresh, canned (in natural juice), or frozen fruits.  Protein Products  All fish and seafood.  Dried beans, peas, or lentils. Unsalted nuts and seeds. Unsalted canned beans.  Dairy Low-fat dairy products, such as skim or 1% milk, 2% or  reduced-fat cheeses, low-fat ricotta or cottage cheese, or plain low-fat yogurt. Low-sodium or reduced-sodium cheeses.  Fats and Oils Tub margarines without trans fats. Light or reduced-fat mayonnaise and salad dressings (reduced sodium). Avocado. Safflower, olive, or canola oils. Natural peanut or almond butter.  Other Unsalted popcorn and pretzels. The items listed above may not be a complete list of recommended foods or beverages. Contact your dietitian for more   options.  +++++++++++++++  WHAT FOODS ARE NOT RECOMMENDED? Grains/ White flour or wheat flour White bread. White pasta. White rice. Refined cornbread. Bagels and croissants. Crackers that contain trans fat.  Vegetables  Creamed or fried vegetables. Vegetables in a . Regular canned vegetables. Regular canned tomato sauce and paste. Regular tomato and vegetable juices.  Fruits Dried fruits. Canned fruit in light or heavy syrup. Fruit juice.  Meat and Other Protein Products Meat in general - RED meat & White meat.  Fatty cuts of meat. Ribs, chicken wings, all processed meats as bacon, sausage, bologna, salami, fatback, hot dogs, bratwurst and packaged luncheon meats.  Dairy Whole or 2% milk, cream, half-and-half, and cream cheese. Whole-fat or sweetened yogurt. Full-fat cheeses or blue cheese. Non-dairy creamers and whipped toppings. Processed cheese, cheese spreads, or cheese curds.  Condiments Onion and garlic salt, seasoned salt, table salt, and sea salt. Canned and packaged gravies. Worcestershire sauce. Tartar sauce. Barbecue sauce. Teriyaki sauce. Soy sauce, including reduced sodium. Steak sauce. Fish sauce. Oyster sauce. Cocktail sauce. Horseradish. Ketchup and mustard. Meat flavorings and tenderizers. Bouillon cubes. Hot sauce. Tabasco sauce. Marinades. Taco seasonings. Relishes.  Fats and Oils Butter, stick margarine, lard, shortening and bacon fat. Coconut, palm kernel, or palm oils. Regular salad  dressings.  Pickles and olives. Salted popcorn and pretzels.  The items listed above may not be a complete list of foods and beverages to avoid. =============================   Hypothyroidism  Hypothyroidism is when the thyroid gland does not make enough of certain hormones (it is underactive). The thyroid gland is a small gland located in the lower front part of the neck, just in front of the windpipe (trachea). This gland makes hormones that help control how the body uses food for energy (metabolism) as well as how the heart and brain function. These hormones also play a role in keeping your bones strong. When the thyroid is underactive, it produces too little of the hormones thyroxine (T4) and triiodothyronine (T3). What are the causes? This condition may be caused by:  Hashimoto's disease. This is a disease in which the body's disease-fighting system (immune system) attacks the thyroid gland. This is the most common cause.  Viral infections.  Pregnancy.  Certain medicines.  Birth defects.  Past radiation treatments to the head or neck for cancer.  Past treatment with radioactive iodine.  Past exposure to radiation in the environment.  Past surgical removal of part or all of the thyroid.  Problems with a gland in the center of the brain (pituitary gland).  Lack of enough iodine in the diet. What increases the risk? You are more likely to develop this condition if:  You are female.  You have a family history of thyroid conditions.  You use a medicine called lithium.  You take medicines that affect the immune system (immunosuppressants). What are the signs or symptoms? Symptoms of this condition include:  Feeling as though you have no energy (lethargy).  Not being able to tolerate cold.  Weight gain that is not explained by a change in diet or exercise habits.  Lack of appetite.  Dry skin.  Coarse hair.  Menstrual irregularity.  Slowing of thought  processes.  Constipation.  Sadness or depression. How is this diagnosed? This condition may be diagnosed based on:  Your symptoms, your medical history, and a physical exam.  Blood tests. You may also have imaging tests, such as an ultrasound or MRI. How is this treated? This condition is treated with medicine that replaces  the thyroid hormones that your body does not make. After you begin treatment, it may take several weeks for symptoms to go away. Follow these instructions at home:  Take over-the-counter and prescription medicines only as told by your health care provider.  If you start taking any new medicines, tell your health care provider.  Keep all follow-up visits as told by your health care provider. This is important. ? As your condition improves, your dosage of thyroid hormone medicine may change. ? You will need to have blood tests regularly so that your health care provider can monitor your condition. Contact a health care provider if:  Your symptoms do not get better with treatment.  You are taking thyroid replacement medicine and you: ? Sweat a lot. ? Have tremors. ? Feel anxious. ? Lose weight rapidly. ? Cannot tolerate heat. ? Have emotional swings. ? Have diarrhea. ? Feel weak. Get help right away if you have:  Chest pain.  An irregular heartbeat.  A rapid heartbeat.  Difficulty breathing. Summary  Hypothyroidism is when the thyroid gland does not make enough of certain hormones (it is underactive).  When the thyroid is underactive, it produces too little of the hormones thyroxine (T4) and triiodothyronine (T3).  The most common cause is Hashimoto's disease, a disease in which the body's disease-fighting system (immune system) attacks the thyroid gland. The condition can also be caused by viral infections, medicine, pregnancy, or past radiation treatment to the head or neck.  Symptoms may include weight gain, dry skin, constipation, feeling as  though you do not have energy, and not being able to tolerate cold.  This condition is treated with medicine to replace the thyroid hormones that your body does not make.

## 2020-07-20 NOTE — Progress Notes (Signed)
History of Present Illness:       This very nice 74 y.o. DBF presents for 6  month follow up with HTN, HLD, Pre-Diabetes and Vitamin D Deficiency.       Patient has hx/o labile HTN  (2012)  & BP has been controlled at home. Today's BP is at goal -  108/72. Patient has had no complaints of any cardiac type chest pain, palpitations, dyspnea / orthopnea / PND, dizziness, claudication, or dependent edema.      Hyperlipidemia is notcontrolled with diet. Patient was rx'd Nexletol by Dr Debara Pickett at the [facility], but she never filled Rx due to high co-pay. Last Lipids were not at goal:  Lab Results  Component Value Date   CHOL 284 (H) 04/19/2020   HDL 104 04/19/2020   LDLCALC 164 (H) 04/19/2020   TRIG 64 04/19/2020   CHOLHDL 2.7 04/19/2020    Also, the patient has history of PreDiabetes (A1c 5.7% /2012)  and has had no symptoms of reactive hypoglycemia, diabetic polys, paresthesias or visual blurring.  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         Further, the patient also has history of Vitamin D Deficiency ("10" /2010and "8" /2018) and supplements vitamin D without any suspected side-effects. Last vitamin D was still low:  Lab Results  Component Value Date   VD25OH 25.8 (L) 05/24/2020    Current Outpatient Medications on File Prior to Visit  Medication Sig  . Acetaminophen (TYLENOL) 325 MG CAPS as needed.   Marland Kitchen acetic acid-hydrocortisone (VOSOL-HC) OTIC solution Place 3 drops into both ears 2 (two) times daily as needed. As needed to ear canals after getting ears wet to prevent infection.  Marland Kitchen FIORICET)50-325-40 MG tablet Take 1 tablet every 4 hours  if needed f  . ZYRTEC-D 5-120 MG tablet Take 1 tablet by mouth 2 (two) times daily.  Marland Kitchen VITAMIN D 1000 Utablet Take 1,000 Units by mouth daily.  Marland Kitchen  FLONASE  nasal spray INSTILL 1-2 SPRAYS INTO BOTH NOSTRILS TWICE DAILY AS NEEDED FOR ALLERGIES  . levothyroxine  50 MCG tablet TAKE 1 AND 1/2 TABLETS BY MOUTH DAILY  . naproxen 220 MG tablet Take 440 mg by mouth 2 (two) times daily as needed (pain).   . ondansetron  8 MG tablet Take 1/2 to 1 tab 3 x /day every 6 hours as needed  . Rimegepant Sulfate (NURTEC) 75 MG TBDP Take 1 tab as needed with onset of migraine symptoms, up to 15 times in a month.  . gabapentin (NEURONTIN) 100 MG capsule Take 1-3 capsules (100-300 mg total) by mouth at bedtime. For sleep and back pain. (Patient not taking: Reported on 07/20/2020)     Allergies  Allergen Reactions  . Doxycycline Itching    itching  . Penicillins Hives  . Pravastatin     Myalgia  . Sulfa Antibiotics Hives  . Zetia [Ezetimibe]     Myalgia    PMHx:   Past Medical History:  Diagnosis Date  . Blind right eye    legally  .  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 High cholesterol   . Hypertension   . Hypothyroidism   . Migraine   . Prediabetes   . Sarcoidosis, lung ([place]) 15 YRS AGO   NO TREATMENTS  . 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 Vertigo   . Vitamin D deficiency     Immunization History  Administered Date(s) Administered  . Influenza Split 06/30/2013  . Influenza, High Dose Seasonal PF 06/26/2015, 07/25/2016, 05/26/2017, 06/30/2018, 06/10/2019, 06/26/2020  . PFIZER SARS-COV-2 Vaccination 11/26/2019, 12/25/2019  . Pneumococcal Conjugate-13 09/14/2014  . Pneumococcal Polysaccharide-23 10/16/2011, 02/04/2017  . Td 10/16/2011  . Zoster 01/14/2007    Past Surgical History:  Procedure Laterality Date  . BACK SURGERY  1970  . CATARACT EXTRACTION Left 12/2017  . COLONOSCOPY  2006; 03/28/11   2 small adenomas; normal  . EYE SURGERY  1964   right eye  . KNEE ARTHROSCOPY  2002  . NASAL SEPTUM SURGERY  1998  . TOTAL ABDOMINAL HYSTERECTOMY  2000    FHx:    Reviewed / unchanged  SHx:    Reviewed / unchanged    Systems Review:  Constitutional: Denies fever, chills, wt changes, headaches, insomnia, fatigue, night sweats, change in appetite. Eyes: Denies redness, blurred vision, diplopia, discharge, itchy, watery eyes.  ENT: Denies discharge, congestion, post nasal drip, epistaxis, sore throat, earache, hearing loss, dental pain, tinnitus, vertigo, sinus pain, snoring.  CV: Denies chest pain, palpitations, irregular heartbeat, syncope, dyspnea, diaphoresis, orthopnea, PND, claudication or edema. Respiratory: denies cough, dyspnea, DOE, pleurisy, hoarseness, laryngitis, wheezing.  Gastrointestinal: Denies dysphagia, odynophagia, heartburn, reflux, water brash, abdominal pain or cramps, nausea, vomiting, bloating, diarrhea, constipation, hematemesis, melena, hematochezia  or hemorrhoids. Genitourinary: Denies dysuria, frequency, urgency, nocturia, hesitancy, discharge, hematuria or flank pain. Musculoskeletal: Denies arthralgias, myalgias, stiffness, jt. swelling, pain, limping or strain/sprain.  Skin: Denies pruritus, rash, hives, warts, acne, eczema or change in skin lesion(s). Neuro: No weakness, tremor, incoordination, spasms, paresthesia or pain. Psychiatric: Denies confusion, memory loss or sensory loss. Endo: Denies change in weight, skin or hair change.  Heme/Lymph: No excessive bleeding, bruising or enlarged lymph nodes.  Physical Exam  BP 108/72   Pulse 84   Temp (!) 97.4 F (36.3 C)   Resp 16   Ht 5\' 6"  (1.676 m)   Wt 173 lb 6.4 oz (78.7 kg)   SpO2 97%   BMI 27.99 kg/m   Appears  well nourished, well groomed  and in no distress.  Eyes: PERRLA, EOMs, conjunctiva no swelling or erythema. Sinuses: No frontal/maxillary tenderness ENT/Mouth: EAC's clear, TM's nl w/o erythema, bulging. Nares clear w/o erythema, swelling, exudates. Oropharynx clear without erythema or exudates. Oral hygiene is good. Tongue normal, non obstructing. Hearing intact.  Neck: Supple. Thyroid not palpable.  Car 2+/2+ without bruits, nodes or JVD. Chest: Respirations nl with BS clear & equal w/o rales, rhonchi, wheezing or stridor.  Cor: Heart sounds normal w/ regular rate and rhythm without sig. murmurs, gallops, clicks or rubs. Peripheral pulses normal and equal  without edema.  Abdomen: Soft & bowel sounds normal. Non-tender w/o guarding, rebound, hernias, masses or organomegaly.  Lymphatics: Unremarkable.  Musculoskeletal: Full ROM all peripheral extremities, joint stability, 5/5 strength and normal gait.  Skin: Warm, dry without exposed rashes, lesions or ecchymosis apparent.  Neuro: Cranial nerves intact, reflexes equal bilaterally. Sensory-motor testing grossly intact. Tendon reflexes grossly intact.  Pysch: Alert & oriented x 3.  Insight and judgement nl & appropriate. No ideations.  Assessment and Plan:  1. Labile hypertension  -  Continue medication, monitor blood pressure at home.  - Continue DASH diet.  Reminder to go to the ER if any CP,  SOB, nausea, dizziness, severe HA, changes vision/speech.  - CBC with Differential/Platelet - COMPLETE METABOLIC PANEL WITH GFR - Magnesium - TSH  2. Hyperlipidemia, mixed  - Continue diet/meds, exercise,& lifestyle modifications.  - Continue monitor periodic cholesterol/liver & renal functions   - Lipid panel - TSH  3. Abnormal glucose  - Continue diet, exercise  - Lifestyle modifications.  - Monitor appropriate labs.  - Hemoglobin A1c - Insulin, random  4. Vitamin D deficiency  - Continue supplementation.  5. Postablative hypothyroidism  - TSH  6. Medication management  - CBC with Differential/Platelet - COMPLETE METABOLIC PANEL WITH GFR - Magnesium - Lipid panel - TSH - Hemoglobin A1c - Insulin, random        Discussed  regular exercise, BP monitoring, weight control to achieve/maintain BMI less than 25 and discussed med and SE's. Recommended labs to assess and monitor clinical status with further disposition  pending results of labs.  I discussed the assessment and treatment plan with the patient. The patient was provided an opportunity to ask questions and all were answered. The patient agreed with the plan and demonstrated an understanding of the instructions.  I provided over 30 minutes of exam, counseling, chart review and  complex critical decision making.   Kirtland Bouchard, MD

## 2020-07-21 LAB — CBC WITH DIFFERENTIAL/PLATELET
Absolute Monocytes: 563 cells/uL (ref 200–950)
Basophils Absolute: 41 cells/uL (ref 0–200)
Basophils Relative: 0.7 %
Eosinophils Absolute: 162 cells/uL (ref 15–500)
Eosinophils Relative: 2.8 %
HCT: 34.5 % — ABNORMAL LOW (ref 35.0–45.0)
Hemoglobin: 11.1 g/dL — ABNORMAL LOW (ref 11.7–15.5)
Lymphs Abs: 2018 cells/uL (ref 850–3900)
MCH: 27.2 pg (ref 27.0–33.0)
MCHC: 32.2 g/dL (ref 32.0–36.0)
MCV: 84.6 fL (ref 80.0–100.0)
MPV: 9.6 fL (ref 7.5–12.5)
Monocytes Relative: 9.7 %
Neutro Abs: 3016 cells/uL (ref 1500–7800)
Neutrophils Relative %: 52 %
Platelets: 361 10*3/uL (ref 140–400)
RBC: 4.08 10*6/uL (ref 3.80–5.10)
RDW: 13.2 % (ref 11.0–15.0)
Total Lymphocyte: 34.8 %
WBC: 5.8 10*3/uL (ref 3.8–10.8)

## 2020-07-21 LAB — COMPLETE METABOLIC PANEL WITH GFR
AG Ratio: 2 (calc) (ref 1.0–2.5)
ALT: 13 U/L (ref 6–29)
AST: 24 U/L (ref 10–35)
Albumin: 4.1 g/dL (ref 3.6–5.1)
Alkaline phosphatase (APISO): 91 U/L (ref 37–153)
BUN: 9 mg/dL (ref 7–25)
CO2: 29 mmol/L (ref 20–32)
Calcium: 9.3 mg/dL (ref 8.6–10.4)
Chloride: 108 mmol/L (ref 98–110)
Creat: 0.79 mg/dL (ref 0.60–0.93)
GFR, Est African American: 86 mL/min/{1.73_m2} (ref >=60)
GFR, Est Non African American: 74 mL/min/{1.73_m2} (ref >=60)
Globulin: 2.1 g/dL (calc) (ref 1.9–3.7)
Glucose, Bld: 90 mg/dL (ref 65–99)
Potassium: 4 mmol/L (ref 3.5–5.3)
Sodium: 144 mmol/L (ref 135–146)
Total Bilirubin: 0.4 mg/dL (ref 0.2–1.2)
Total Protein: 6.2 g/dL (ref 6.1–8.1)

## 2020-07-21 LAB — HEMOGLOBIN A1C
Hgb A1c MFr Bld: 5.3 % of total Hgb (ref <5.7)
Mean Plasma Glucose: 105 (calc)
eAG (mmol/L): 5.8 (calc)

## 2020-07-21 LAB — INSULIN, RANDOM: Insulin: 3.5 u[IU]/mL

## 2020-07-21 LAB — LIPID PANEL
Cholesterol: 269 mg/dL — ABNORMAL HIGH (ref <200)
HDL: 102 mg/dL (ref >=50)
LDL Cholesterol (Calc): 152 mg/dL (calc) — ABNORMAL HIGH
Non-HDL Cholesterol (Calc): 167 mg/dL (calc) — ABNORMAL HIGH (ref <130)
Total CHOL/HDL Ratio: 2.6 (calc) (ref <5.0)
Triglycerides: 53 mg/dL (ref <150)

## 2020-07-21 LAB — TSH: TSH: 4.22 mIU/L (ref 0.40–4.50)

## 2020-07-21 LAB — MAGNESIUM: Magnesium: 2.4 mg/dL (ref 1.5–2.5)

## 2020-07-22 NOTE — Progress Notes (Signed)
========================================================== ==========================================================  -    Chronic mild anemia - is Stable  ==========================================================  - Chol = 269  & LDL Chol = 152 - Both  Dangerously high Risk for  Heart Attack, Stroke, Kidney Failure and Dementia or all   - PLEASE schedule an appt to see Dr Debara Pickett again at the [place] something bad happens  !  !    - Ignoring the High Cholesterol will not make the Risk go Away  !  ! ==========================================================  - A1c is Normal - Great - No Diabetes   ==========================================================  Kidney functions show CKD - Chronic Kidney Disease - Stage 2 & Stable   - All Else - CBC - Electrolytes - Liver - Magnesium & Thyroid    - all  Normal / OK ==========================================================

## 2020-07-23 ENCOUNTER — Encounter: Payer: Self-pay | Admitting: Internal Medicine

## 2020-07-24 ENCOUNTER — Encounter: Payer: Self-pay | Admitting: *Deleted

## 2020-07-25 DIAGNOSIS — Z1231 Encounter for screening mammogram for malignant neoplasm of breast: Secondary | ICD-10-CM | POA: Diagnosis not present

## 2020-07-25 LAB — HM MAMMOGRAPHY

## 2020-08-01 ENCOUNTER — Encounter: Payer: Self-pay | Admitting: Internal Medicine

## 2020-08-28 ENCOUNTER — Other Ambulatory Visit: Payer: Self-pay | Admitting: Adult Health

## 2020-08-28 DIAGNOSIS — G4700 Insomnia, unspecified: Secondary | ICD-10-CM

## 2020-08-31 ENCOUNTER — Encounter: Payer: Self-pay | Admitting: Internal Medicine

## 2020-08-31 ENCOUNTER — Ambulatory Visit: Payer: Medicare PPO | Admitting: Internal Medicine

## 2020-08-31 ENCOUNTER — Other Ambulatory Visit: Payer: Self-pay

## 2020-08-31 VITALS — BP 118/80 | HR 90 | Ht 66.0 in | Wt 171.0 lb

## 2020-08-31 DIAGNOSIS — T466X5A Adverse effect of antihyperlipidemic and antiarteriosclerotic drugs, initial encounter: Secondary | ICD-10-CM

## 2020-08-31 DIAGNOSIS — E782 Mixed hyperlipidemia: Secondary | ICD-10-CM

## 2020-08-31 DIAGNOSIS — M791 Myalgia, unspecified site: Secondary | ICD-10-CM

## 2020-08-31 DIAGNOSIS — R931 Abnormal findings on diagnostic imaging of heart and coronary circulation: Secondary | ICD-10-CM | POA: Diagnosis not present

## 2020-08-31 MED ORDER — ROSUVASTATIN CALCIUM 5 MG PO TABS: 5.0000 mg | ORAL_TABLET | ORAL | 3 refills | Status: DC

## 2020-08-31 NOTE — Patient Instructions (Signed)
Medication Instructions:  START crestor 5mg  every other day   *If you need a refill on your cardiac medications before your next appointment, please call your pharmacy*   Lab Work: FASTING lipid panel in 3 months -- complete about 1 week prior to next visit with Dr. Debara Pickett   If you have labs (blood work) drawn today and your tests are completely normal, you will receive your results only by: Marland Kitchen MyChart Message (if you have MyChart) OR . A paper copy in the mail If you have any lab test that is abnormal or we need to change your treatment, we will call you to review the results.   Testing/Procedures: NONE   Follow-Up: At [facility], you and your health needs are our priority.  As part of our continuing mission to provide you with exceptional heart care, we have created designated Provider Care Teams.  These Care Teams include your primary Cardiologist (physician) and Advanced Practice Providers (APPs -  Physician Assistants and Nurse Practitioners) who all work together to provide you with the care you need, when you need it.  We recommend signing up for the patient portal called "MyChart".  Sign up information is provided on this After Visit Summary.  MyChart is used to connect with patients for Virtual Visits (Telemedicine).  Patients are able to view lab/test results, encounter notes, upcoming appointments, etc.  Non-urgent messages can be sent to your provider as well.   To learn more about what you can do with MyChart, go to [email or web address].    Your next appointment:   3 month(s)  The format for your next appointment:   In Person  Provider:   K. Mali Hilty, MD   Other Instructions

## 2020-08-31 NOTE — Progress Notes (Signed)
LIPID CLINIC CONSULT NOTE  Chief Complaint:  Follow-up dyslipidemia  Primary Care Physician: Laura Pinto, MD  Primary Cardiologist:  No primary care provider on file.  HPI:  Laura Maynard is a 74 y.o. female who is being seen today for the evaluation of dyslipidemia at the request of Laura Pinto, MD. This is a pleasant 74 year old female who is kindly referred by Dr. Melford Maynard for evaluation of dyslipidemia.  According to her she has had a longstanding history of dyslipidemia and is been on multiple therapies including the statins (pravastatin, atorvastatin (and ezetimibe, for which she has had significant side effects including myalgias.  Recently she had a lipid profile which showed total cholesterol 269, triglycerides 57, HDL 99 and LDL 155.  I reviewed her chart and noted that she has no history of coronary disease and few cardiovascular risk factors other than age.  She has no history of hypertension, diabetes and no significant premature coronary disease in her family.  She does have hypothyroidism on low-dose levothyroxine.  Chart review indicated she had a CT angios in 2014 which showed no evidence of pulmonary embolism or pulmonary sarcoidosis.  Also there was no mention of coronary calcification.  08/31/2020  Laura Maynard is seen today in follow-up.  Repeat lipids show persistent elevation with total cholesterol 269, triglycerides 102, HDL 53 and LDL 152.  She has not been on therapy for this.  A CT calcium score did show some coronary calcium with a score 75, 64th percentile for age and sex matched control.  Small area of calcification noted in the distal circumflex artery.  On these findings I would recommend therapy.  I had recommended trying bempedoic acid however the cost would have been $68 a month.  This is felt to be too expensive for her.  We then talked about several other options.  One might be a very low-dose of a statin such as rosuvastatin which is very potent.  She  seemed agreeable to this.  PMHx:  Past Medical History:  Diagnosis Date  . Blind right eye    legally  . 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 High cholesterol   . Hypertension   . Hypothyroidism   . Migraine   . Prediabetes   . Sarcoidosis, lung ([place]) 15 YRS AGO   NO TREATMENTS  . 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 Vertigo   . Vitamin D deficiency     Past Surgical History:  Procedure Laterality Date  . BACK SURGERY  1970  . CATARACT EXTRACTION Left 12/2017  . COLONOSCOPY  2006; 03/28/11   2 small adenomas; normal  . EYE SURGERY  1964   right eye  . KNEE ARTHROSCOPY  2002  . NASAL SEPTUM SURGERY  1998  . TOTAL ABDOMINAL HYSTERECTOMY  2000    FAMHx:  Family History  Problem Relation Age of Onset  . Lung cancer Sister 79  . Throat cancer Brother 47  . Stroke Mother   . Emphysema Father   . COPD Sister   . Diabetes Brother   . Colon cancer Neg Hx     SOCHx:   reports that she has never smoked. She has never used smokeless tobacco. She reports that she does not drink alcohol and does not use drugs.  ALLERGIES:  Allergies  Allergen Reactions  . Doxycycline Itching    itching  . Gabapentin Nausea And Vomiting  . Penicillins Hives  . Pravastatin     Myalgia  . Sulfa Antibiotics Hives  .  Zetia [Ezetimibe]     Myalgia    ROS: Pertinent items noted in HPI and remainder of comprehensive ROS otherwise negative.  HOME MEDS: Current Outpatient Medications on File Prior to Visit  Medication Sig Dispense Refill  . cholecalciferol (VITAMIN D3) 25 MCG (1000 UT) tablet Take 1,000 Units by mouth daily.    Marland Kitchen levothyroxine (SYNTHROID) 50 MCG tablet TAKE 1 AND 1/2 TABLETS BY MOUTH DAILY 150 tablet 3  . Acetaminophen (TYLENOL) 325 MG CAPS as needed.  (Patient not taking: Reported on 08/31/2020)    . butalbital-acetaminophen-caffeine (FIORICET) 50-325-40 MG tablet Take 1 tablet every 4 hours  if needed for Severe Headache (Patient not taking: Reported on 08/31/2020)  30 tablet 0  . cetirizine-pseudoephedrine (ZYRTEC-D) 5-120 MG tablet Take 1 tablet by mouth 2 (two) times daily. (Patient not taking: Reported on 08/31/2020) 60 tablet 2  . fluticasone (FLONASE) 50 MCG/ACT nasal spray INSTILL 1-2 SPRAYS INTO BOTH NOSTRILS TWICE DAILY AS NEEDED FOR ALLERGIES (Patient not taking: Reported on 08/31/2020) 48 g 3  . naproxen sodium (ANAPROX) 220 MG tablet Take 440 mg by mouth 2 (two) times daily as needed (pain).  (Patient not taking: Reported on 08/31/2020)    . Rimegepant Sulfate (NURTEC) 75 MG TBDP Take 1 tab as needed with onset of migraine symptoms, up to 15 times in a month. (Patient not taking: Reported on 08/31/2020) 15 tablet 2   No current facility-administered medications on file prior to visit.    LABS/IMAGING: No results found for this or any previous visit (from the past 48 hour(s)). No results found.  LIPID PANEL:    Component Value Date/Time   CHOL 269 (H) 07/20/2020 1536   TRIG 53 07/20/2020 1536   HDL 102 07/20/2020 1536   CHOLHDL 2.6 07/20/2020 1536   VLDL 13 02/04/2017 1550   LDLCALC 152 (H) 07/20/2020 1536    WEIGHTS: Wt Readings from Last 3 Encounters:  08/31/20 171 lb (77.6 kg)  07/20/20 173 lb 6.4 oz (78.7 kg)  07/14/20 172 lb 9.6 oz (78.3 kg)    VITALS: BP 118/80   Pulse 90   Ht 5\' 6"  (1.676 m)   Wt 171 lb (77.6 kg)   SpO2 96%   BMI 27.60 kg/m   EXAM: Deferred  EKG: Deferred  ASSESSMENT: 1. Mixed dyslipidemia 2. CAC score of 43, 64th percentile (06/2019) 3. Statin intolerance  PLAN: 1.   [name] does have an elevated calcium score but an intermediate percentile.  I would target her LDL to less than 100.  I do think she would benefit from statin therapy.  We discussed several options and she is willing to try low-dose rosuvastatin 5 mg every other day.  We will plan repeat lipids in 3 months if she is tolerating it and follow-up with me afterwards  Pixie Casino, MD, [facility], [place] Director of the Advanced Lipid Disorders &  Cardiovascular Risk Reduction Clinic Diplomate of the American Board of Clinical Lipidology Attending Cardiologist  Direct Dial: [phone]  Fax: [phone]  Website:  [email or web address] 08/31/2020, 9:38 AM

## 2020-09-25 ENCOUNTER — Other Ambulatory Visit: Payer: Self-pay | Admitting: Internal Medicine

## 2020-09-25 DIAGNOSIS — R42 Dizziness and giddiness: Secondary | ICD-10-CM

## 2020-09-26 ENCOUNTER — Other Ambulatory Visit: Payer: Self-pay | Admitting: Internal Medicine

## 2020-09-26 DIAGNOSIS — R42 Dizziness and giddiness: Secondary | ICD-10-CM

## 2020-09-26 MED ORDER — MECLIZINE HCL 25 MG PO TABS: ORAL_TABLET | ORAL | 0 refills | Status: DC

## 2020-10-11 ENCOUNTER — Other Ambulatory Visit: Payer: Self-pay | Admitting: Adult Health

## 2020-10-11 DIAGNOSIS — G44219 Episodic tension-type headache, not intractable: Secondary | ICD-10-CM

## 2020-10-25 ENCOUNTER — Ambulatory Visit: Payer: Medicare PPO | Admitting: Adult Health

## 2020-11-02 ENCOUNTER — Encounter: Payer: Medicare Other | Admitting: Internal Medicine

## 2020-11-17 ENCOUNTER — Ambulatory Visit: Payer: Medicare PPO | Admitting: Internal Medicine

## 2020-11-17 ENCOUNTER — Encounter: Payer: Self-pay | Admitting: Internal Medicine

## 2020-11-17 ENCOUNTER — Other Ambulatory Visit: Payer: Self-pay

## 2020-11-17 VITALS — BP 138/90 | HR 97 | Ht 66.0 in | Wt 175.2 lb

## 2020-11-17 DIAGNOSIS — E559 Vitamin D deficiency, unspecified: Secondary | ICD-10-CM

## 2020-11-17 DIAGNOSIS — E890 Postprocedural hypothyroidism: Secondary | ICD-10-CM | POA: Diagnosis not present

## 2020-11-17 LAB — TSH: TSH: 2.67 u[IU]/mL (ref 0.35–4.50)

## 2020-11-17 LAB — T4, FREE: Free T4: 0.88 ng/dL (ref 0.60–1.60)

## 2020-11-17 NOTE — Patient Instructions (Addendum)
Please return for labs.  Please take vit D 1000 units daily.  Continue Levothyroxine 75 alternating with 50 mcg every other day.  Take the thyroid hormone every day, with water, at least 30 minutes before breakfast, separated by at least 4 hours from: - acid reflux medications - calcium - iron - multivitamins  Please come back for a follow-up appointment in 6 months.

## 2020-11-17 NOTE — Progress Notes (Signed)
Patient ID: Laura Maynard, female   DOB: [birth year], 75 y.o.   MRN: [id number]  This visit occurred during the SARS-CoV-2 public health emergency.  Safety protocols were in place, including screening questions prior to the visit, additional usage of staff PPE, and extensive cleaning of exam room while observing appropriate contact time as indicated for disinfecting solutions.   HPI  JOSILYN SHIPPEE is a 75 y.o.-year-old female, returning for f/u for postablative hypothyroidism after toxic multinodular goiter (TMNG) radioactive iodine treatment. Last visit 6 months ago.  Reviewed and addended history: Pt had screening labs in 01/2014 and was found to have a low TSH. This was repeated 2x and the TSH continued to decrease.   Thyroid Uptake and scan (05/20/2014) >> uptake 48.5% and scan c/w TMNG.  We started MMI 5 mg bid.  Had RAI tx on 06/09/2014.   After RAI treatment, she developed post-ablative hypothyroidism >> we started levothyroxine 50 g daily. She developed leg cramps, which resolved after stopping levothyroxine but her TSH started to increase, so we change to Tirosint. Unfortunately, she could not afford this and we had to restart levothyroxine at the lower dose, subsequent to be increased back to 50 g daily (on 08/12/2016).  Her TSH became suppressed after increased the dose to 75 mcg daily.    She had itching with a 75 mcg tablet so we switched to only 50 mcg tablets, which are white.  She is tolerating these well.  She continues on levothyroxine 50 alternating with 75 mcg every other day: - in am - fasting - at least 30 min from b'fast - no calcium - no iron - no multivitamins - no PPIs - not on Biotin  Reviewed her TFTs: Lab Results  Component Value Date   TSH 4.22 07/20/2020   TSH 3.77 01/19/2020   TSH 3.41 10/26/2019   TSH 3.11 04/22/2019   TSH 2.79 10/29/2018   TSH 4.71 (H) 09/17/2018   TSH 1.93 06/15/2018   TSH 2.69 02/05/2018   TSH 0.75 10/14/2017   TSH 0.29  (L) 08/28/2017   FREET4 0.96 10/26/2019   FREET4 0.89 04/22/2019   FREET4 0.95 10/29/2018   FREET4 0.95 10/14/2017   FREET4 1.14 08/28/2017   FREET4 1.11 06/23/2017   FREET4 0.91 04/28/2017   FREET4 0.87 10/02/2016   FREET4 0.65 08/12/2016   FREET4 0.44 (L) 06/26/2016    Pt denies: - feeling nodules in neck - hoarseness - dysphagia - choking - SOB with lying down  She also has a history of HTN, HL, anemia.  Vitamin D deficiency -severe:  Reviewed vitamin D levels: Lab Results  Component Value Date   VD25OH 25.8 (L) 05/24/2020   VD25OH 22 (L) 01/19/2020   VD25OH 27.83 (L) 10/26/2019   VD25OH 24.26 (L) 04/22/2019   VD25OH 6 (L) 09/17/2018   VD25OH 12 (L) 02/05/2018   VD25OH 16.80 (L) 06/23/2017   VD25OH 23.88 (L) 04/28/2017   VD25OH 20 (L) 02/04/2017   VD25OH 8 (L) 11/05/2016   She had very low vitamin D levels in the past; she was previously on 5000 units vitamin D daily but was not compliant with this due to headaches and itching.  She was on 1000 units daily at last visit (reduced dose due to concerns for constipation), and I advised her to increase to 2000 units daily.  However at last visit she was still on 1000 units daily.  His vitamin D was low I advised her to at least alternate 1000 with 2000  units every other day. Still on 1000 units daily.She tells me that this is the maximum amount that she can take per day.  She had steroid injections in her knees.  Dr. Debara Pickett started a statin (Crestor 5) qod.  ROS: Constitutional: no weight gain/no weight loss, no fatigue, no subjective hyperthermia, no subjective hypothermia Eyes: no blurry vision, no xerophthalmia ENT: no sore throat, + see HPI Cardiovascular: no CP/no SOB/no palpitations/no leg swelling Respiratory: no cough/no SOB/no wheezing Gastrointestinal: no N/no V/no D/no C/no acid reflux Musculoskeletal: + Muscle aches/+ joint aches - knees Skin: no rashes, no hair loss Neurological: no tremors/no numbness/no  tingling/no dizziness  I reviewed pt's medications, allergies, PMH, social hx, family hx, and changes were documented in the history of present illness. Otherwise, unchanged from my initial visit note.  Past Medical History:  Diagnosis Date  . Blind right eye    legally  . 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 High cholesterol   . Hypertension   . Hypothyroidism   . Migraine   . Prediabetes   . Sarcoidosis, lung ([place]) 15 YRS AGO   NO TREATMENTS  . 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 Vertigo   . Vitamin D deficiency    Past Surgical History:  Procedure Laterality Date  . BACK SURGERY  1970  . CATARACT EXTRACTION Left 12/2017  . COLONOSCOPY  2006; 03/28/11   2 small adenomas; normal  . EYE SURGERY  1964   right eye  . KNEE ARTHROSCOPY  2002  . NASAL SEPTUM SURGERY  1998  . TOTAL ABDOMINAL HYSTERECTOMY  2000   Social History   Socioeconomic History  . Marital status: Divorced    Spouse name: Not on file  . Number of children: 0  . Years of education: college  . Highest education level: Not on file  Occupational History    Comment: Retired  Tobacco Use  . Smoking status: Never Smoker  . Smokeless tobacco: Never Used  Vaping Use  . Vaping Use: Never used  Substance and Sexual Activity  . Alcohol use: Never    Alcohol/week: 1.0 standard drink    Types: 1 Standard drinks or equivalent per week    Comment: RARE  . Drug use: Never  . Sexual activity: Not Currently  Other Topics Concern  . Not on file  Social History Narrative   Patient is retired and lives at home alone. Patient  has college education.   Caffeine- 3 cups of caffeine daily.  One soda daily.   Right handed.   Social Determinants of Health   Financial Resource Strain: Not on file  Food Insecurity: Not on file  Transportation Needs: Not on file  Physical Activity: Not on file  Stress: Not on file  Social Connections: Not on file  Intimate Partner Violence: Not on file   Current  Outpatient Medications on File Prior to Visit  Medication Sig Dispense Refill  . Acetaminophen (TYLENOL) 325 MG CAPS as needed.  (Patient not taking: Reported on 08/31/2020)    . butalbital-acetaminophen-caffeine (FIORICET) 50-325-40 MG tablet TAKE 1 TABLET BY MOUTH EVERY 4 HOURS AS NEEDED FOR SEVERE HEADACHE 30 tablet 0  . cetirizine-pseudoephedrine (ZYRTEC-D) 5-120 MG tablet Take 1 tablet by mouth 2 (two) times daily. (Patient not taking: Reported on 08/31/2020) 60 tablet 2  . cholecalciferol (VITAMIN D3) 25 MCG (1000 UT) tablet Take 1,000 Units by mouth daily.    . fluticasone (FLONASE) 50 MCG/ACT nasal spray INSTILL 1-2 SPRAYS INTO BOTH NOSTRILS TWICE DAILY AS  NEEDED FOR ALLERGIES (Patient not taking: Reported on 08/31/2020) 48 g 3  . levothyroxine (SYNTHROID) 50 MCG tablet TAKE 1 AND 1/2 TABLETS BY MOUTH DAILY 150 tablet 3  . meclizine (ANTIVERT) 25 MG tablet Take      1 tablet      3 x day       if needed for Dizziness /Vertigo 100 tablet 0  . naproxen sodium (ANAPROX) 220 MG tablet Take 440 mg by mouth 2 (two) times daily as needed (pain).  (Patient not taking: Reported on 08/31/2020)    . Rimegepant Sulfate (NURTEC) 75 MG TBDP Take 1 tab as needed with onset of migraine symptoms, up to 15 times in a month. (Patient not taking: Reported on 08/31/2020) 15 tablet 2  . rosuvastatin (CRESTOR) 5 MG tablet Take 1 tablet (5 mg total) by mouth every other day. 45 tablet 3   No current facility-administered medications on file prior to visit.   Allergies  Allergen Reactions  . Doxycycline Itching    itching  . Gabapentin Nausea And Vomiting  . Penicillins Hives  . Pravastatin     Myalgia  . Sulfa Antibiotics Hives  . Zetia [Ezetimibe]     Myalgia   Family History  Problem Relation Age of Onset  . Lung cancer Sister 61  . Throat cancer Brother 42  . Stroke Mother   . Emphysema Father   . COPD Sister   . Diabetes Brother   . Colon cancer Neg Hx    PE: BP 138/90   Pulse 97   Ht 5\' 6"   (1.676 m)   Wt 175 lb 3.2 oz (79.5 kg)   SpO2 97%   BMI 28.28 kg/m  Body mass index is 28.28 kg/m. Wt Readings from Last 3 Encounters:  11/17/20 175 lb 3.2 oz (79.5 kg)  08/31/20 171 lb (77.6 kg)  07/20/20 173 lb 6.4 oz (78.7 kg)   Constitutional: overweight, in NAD Eyes: PERRLA, EOMI, no exophthalmos ENT: moist mucous membranes, no thyromegaly, no cervical lymphadenopathy Cardiovascular: tachycardia, RR, No MRG Respiratory: CTA B Gastrointestinal: abdomen soft, NT, ND, BS+ Musculoskeletal: no deformities, strength intact in all 4 Skin: moist, warm, no rashes Neurological: no tremor with outstretched hands, DTR normal in all 4  ASSESSMENT: 1. Hypothyroidism after RAI tx  -In the past, she wanted to just see PCP for the hypothyroidism, but she returns 6 months ago and would like to continue to follow with me  2. MNG  3. vitamin D deficiency  PLAN:  1. And 2.  Patient with history of thyrotoxicosis due to toxic multinodular goiter, resolved after RAI treatment.  She then developed post ablative hypothyroidism and started levothyroxine.  She was initially complaining of severe cramps with levothyroxine but she was later found to also have vitamin D deficiency.  We did try Tirosint (liquid levothyroxine) but this was too expensive.  She also developed itching on the 75 mcg levothyroxine tablets of she is now taking only 50 mcg tablets, without coloring agents.  Her HA resolved on these. - latest thyroid labs reviewed with pt >> normal: Lab Results  Component Value Date   TSH 4.22 07/20/2020   - she continues on LT4 88 alternating with 75 mcg every other day - pt feels good on this dose. - we discussed about taking the thyroid hormone every day, with water, >30 minutes before breakfast, separated by >4 hours from acid reflux medications, calcium, iron, multivitamins. Pt. is taking it correctly. - will check thyroid tests  today: TSH and fT4 - If labs are abnormal, she will need to  return for repeat TFTs in 1.5 months  3.  vitamin D deficiency  -She had a very low level of vitamin D in the past, and unfortunately, she could not tolerate 5000 units vitamin D daily due to constipation.  I advised her to take 1000 units vitamin D daily -was taking this at last visit. -At last visit, vitamin D level was still low, so I advised her to increase her vitamin D by at least alternating 1000 with 2000 units every other day.  However, she could not do this due to constipation.  She tells me that the maximum amount that she can take is 1000 units daily. -We will repeat a level today  Component     Latest Ref Rng & Units 11/17/2020  TSH     0.35 - 4.50 uIU/mL 2.67  Vitamin D, 25-Hydroxy     30.0 - 100.0 ng/mL 26.0 (L)  T4,Free(Direct)     0.60 - 1.60 ng/dL 0.88  Vitamin D is only slightly low and since she cannot tolerate a higher dose of vitamin D supplement, will continue 1000 units daily. TFTs are normal.  Philemon Kingdom, MD PhD [facility] Endocrinology

## 2020-11-18 LAB — VITAMIN D 25 HYDROXY (VIT D DEFICIENCY, FRACTURES): Vit D, 25-Hydroxy: 26 ng/mL — ABNORMAL LOW (ref 30.0–100.0)

## 2020-11-21 ENCOUNTER — Telehealth: Payer: Self-pay | Admitting: Internal Medicine

## 2020-11-21 NOTE — Telephone Encounter (Signed)
Called and spoke with pt regarding results. Results are being mailed per pt request.

## 2020-11-21 NOTE — Telephone Encounter (Signed)
Pt requests a call regarding her labs  [phone]

## 2020-12-01 DIAGNOSIS — E782 Mixed hyperlipidemia: Secondary | ICD-10-CM | POA: Diagnosis not present

## 2020-12-01 LAB — LIPID PANEL
Chol/HDL Ratio: 2.1 ratio (ref 0.0–4.4)
Cholesterol, Total: 247 mg/dL — ABNORMAL HIGH (ref 100–199)
HDL: 118 mg/dL (ref >39)
LDL Chol Calc (NIH): 121 mg/dL — ABNORMAL HIGH (ref 0–99)
Triglycerides: 50 mg/dL (ref 0–149)
VLDL Cholesterol Cal: 8 mg/dL (ref 5–40)

## 2020-12-07 ENCOUNTER — Other Ambulatory Visit: Payer: Self-pay

## 2020-12-07 ENCOUNTER — Ambulatory Visit: Payer: Medicare PPO | Admitting: Internal Medicine

## 2020-12-07 ENCOUNTER — Encounter: Payer: Self-pay | Admitting: Internal Medicine

## 2020-12-07 ENCOUNTER — Telehealth: Payer: Self-pay | Admitting: Internal Medicine

## 2020-12-07 VITALS — BP 128/80 | HR 83 | Ht 66.0 in | Wt 176.0 lb

## 2020-12-07 DIAGNOSIS — E782 Mixed hyperlipidemia: Secondary | ICD-10-CM

## 2020-12-07 DIAGNOSIS — R931 Abnormal findings on diagnostic imaging of heart and coronary circulation: Secondary | ICD-10-CM | POA: Diagnosis not present

## 2020-12-07 DIAGNOSIS — T466X5D Adverse effect of antihyperlipidemic and antiarteriosclerotic drugs, subsequent encounter: Secondary | ICD-10-CM

## 2020-12-07 DIAGNOSIS — M791 Myalgia, unspecified site: Secondary | ICD-10-CM

## 2020-12-07 MED ORDER — NEXLETOL 180 MG PO TABS: 1.0000 | ORAL_TABLET | Freq: Every day | ORAL | 11 refills | Status: DC

## 2020-12-07 NOTE — Patient Instructions (Signed)
Medication Instructions:  STOP crestor STAY OFF cholesterol medication for 1 month Then START nexletol 180mg  daily  Patient Assistance:  The [employer] foundation offers assistance to help pay for medication copays.  They will cover copays for all cholesterol lowering meds, including statins, fibrates, omega-3 oils, ezetimibe, Repatha, Praluent, Nexletol, Nexlizet.  The cards are usually good for $2,500 or 12 months, whichever comes first. 1. Go to healthwellfoundation.org 2. Click on "Apply Now" 3. Answer questions as to whom is applying (patient or representative) 4. Your disease fund will be "hypercholesterolemia - Medicare access" 5. They will ask questions about finances and which medications you are taking for cholesterol 6. When you submit, the approval is usually within minutes.  You will need to print the card information from the site 7. You will need to show this information to your pharmacy, they will bill your Medicare Part D plan first -then bill [employer] --for the copay.   You can also call them at [phone], although the hold times can be quite long.   *If you need a refill on your cardiac medications before your next appointment, please call your pharmacy*   Lab Work: FASTING lab work in 4 months to check cholesterol   If you have labs (blood work) drawn today and your tests are completely normal, you will receive your results only by: Marland Kitchen MyChart Message (if you have MyChart) OR . A paper copy in the mail If you have any lab test that is abnormal or we need to change your treatment, we will call you to review the results.   Testing/Procedures: NONE   Follow-Up: At [facility], you and your health needs are our priority.  As part of our continuing mission to provide you with exceptional heart care, we have created designated Provider Care Teams.  These Care Teams include your primary Cardiologist (physician) and Advanced Practice Providers (APPs -  Physician  Assistants and Nurse Practitioners) who all work together to provide you with the care you need, when you need it.  We recommend signing up for the patient portal called "MyChart".  Sign up information is provided on this After Visit Summary.  MyChart is used to connect with patients for Virtual Visits (Telemedicine).  Patients are able to view lab/test results, encounter notes, upcoming appointments, etc.  Non-urgent messages can be sent to your provider as well.   To learn more about what you can do with MyChart, go to [email or web address].    Your next appointment:   4-5 month(s) - lipid clinic  The format for your next appointment:   In Person  Provider:   K. Mali Hilty, MD   Other Instructions

## 2020-12-07 NOTE — Progress Notes (Signed)
LIPID CLINIC CONSULT NOTE  Chief Complaint:  Follow-up dyslipidemia  Primary Care Physician: [name], MD   Primary Cardiologist:  No primary care provider on file.  HPI:  Laura Maynard is a 75 y.o. female who is being seen today for the evaluation of dyslipidemia at the request of [name], MD. This is a pleasant 75 year old female who is kindly referred by Dr. Melford Aase for evaluation of dyslipidemia.  According to her she has had a longstanding history of dyslipidemia and is been on multiple therapies including the statins (pravastatin, atorvastatin (and ezetimibe, for which she has had significant side effects including myalgias.  Recently she had a lipid profile which showed total cholesterol 269, triglycerides 57, HDL 99 and LDL 155.  I reviewed her chart and noted that she has no history of coronary disease and few cardiovascular risk factors other than age.  She has no history of hypertension, diabetes and no significant premature coronary disease in her family.  She does have hypothyroidism on low-dose levothyroxine.  Chart review indicated she had a CT angios in 2014 which showed no evidence of pulmonary embolism or pulmonary sarcoidosis.  Also there was no mention of coronary calcification.  08/31/2020  Laura Maynard is seen today in follow-up.  Repeat lipids show persistent elevation with total cholesterol 269, triglycerides 102, HDL 53 and LDL 152.  She has not been on therapy for this.  A CT calcium score did show some coronary calcium with a score 32, 64th percentile for age and sex matched control.  Small area of calcification noted in the distal circumflex artery.  On these findings I would recommend therapy.  I had recommended trying bempedoic acid however the cost would have been $68 a month.  This is felt to be too expensive for her.  We then talked about several other options.  One might be a very low-dose of a statin such as rosuvastatin which is very potent.   She seemed agreeable to this.  12/07/2020  Laura Maynard is seen today in follow-up.  She has been on low-dose Crestor 5 mg every other day however has significant arthralgias/myalgias which are likely related to the rosuvastatin.  She had similar symptoms with pravastatin and ezetimibe in the past.  I do think we will need to consider an alternative.  In the past we had prescribed bempedoic acid and she was approved for that however the cost was an issue.  I would like to revisit that and she may now be eligible for a grant from [employer].  Her recent cholesterol showed total 247, triglycerides 50, HDL 118 and LDL 121.  PMHx:  Past Medical History:  Diagnosis Date  . Blind right eye    legally  . 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 High cholesterol   . Hypertension   . Hypothyroidism   . Migraine   . Prediabetes   . Sarcoidosis, lung ([place]) 15 YRS AGO   NO TREATMENTS  . 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 Vertigo   . Vitamin D deficiency     Past Surgical History:  Procedure Laterality Date  . BACK SURGERY  1970  . CATARACT EXTRACTION Left 12/2017  . COLONOSCOPY  2006; 03/28/11   2 small adenomas; normal  . EYE SURGERY  1964   right eye  . KNEE ARTHROSCOPY  2002  . NASAL SEPTUM SURGERY  1998  . TOTAL ABDOMINAL HYSTERECTOMY  2000    FAMHx:  Family History  Problem Relation Age  of Onset  . Lung cancer Sister 21  . Throat cancer Brother 43  . Stroke Mother   . Emphysema Father   . COPD Sister   . Diabetes Brother   . Colon cancer Neg Hx     SOCHx:   reports that she has never smoked. She has never used smokeless tobacco. She reports that she does not drink alcohol and does not use drugs.  ALLERGIES:  Allergies  Allergen Reactions  . Doxycycline Itching    itching  . Gabapentin Nausea And Vomiting  . Penicillins Hives  . Pravastatin     Myalgia  . Sulfa Antibiotics Hives  . Zetia [Ezetimibe]     Myalgia    ROS: Pertinent items noted in HPI and  remainder of comprehensive ROS otherwise negative.  HOME MEDS: Current Outpatient Medications on File Prior to Visit  Medication Sig Dispense Refill  . butalbital-acetaminophen-caffeine (FIORICET) 50-325-40 MG tablet TAKE 1 TABLET BY MOUTH EVERY 4 HOURS AS NEEDED FOR SEVERE HEADACHE 30 tablet 0  . cholecalciferol (VITAMIN D3) 25 MCG (1000 UT) tablet Take 1,000 Units by mouth daily.    Marland Kitchen levothyroxine (SYNTHROID) 50 MCG tablet TAKE 1 AND 1/2 TABLETS BY MOUTH DAILY 150 tablet 3  . meclizine (ANTIVERT) 25 MG tablet Take      1 tablet      3 x day       if needed for Dizziness /Vertigo 100 tablet 0  . rosuvastatin (CRESTOR) 5 MG tablet Take 1 tablet (5 mg total) by mouth every other day. 45 tablet 3  . Acetaminophen 325 MG CAPS as needed.  (Patient not taking: No sig reported)    . cetirizine-pseudoephedrine (ZYRTEC-D) 5-120 MG tablet Take 1 tablet by mouth 2 (two) times daily. (Patient not taking: No sig reported) 60 tablet 2  . fluticasone (FLONASE) 50 MCG/ACT nasal spray INSTILL 1-2 SPRAYS INTO BOTH NOSTRILS TWICE DAILY AS NEEDED FOR ALLERGIES (Patient not taking: No sig reported) 48 g 3  . naproxen sodium (ANAPROX) 220 MG tablet Take 440 mg by mouth 2 (two) times daily as needed (pain).  (Patient not taking: No sig reported)    . Rimegepant Sulfate (NURTEC) 75 MG TBDP Take 1 tab as needed with onset of migraine symptoms, up to 15 times in a month. (Patient not taking: No sig reported) 15 tablet 2   No current facility-administered medications on file prior to visit.    LABS/IMAGING: No results found for this or any previous visit (from the past 48 hour(s)). No results found.  LIPID PANEL:    Component Value Date/Time   CHOL 247 (H) 12/01/2020 0843   TRIG 50 12/01/2020 0843   HDL 118 12/01/2020 0843   CHOLHDL 2.1 12/01/2020 0843   CHOLHDL 2.6 07/20/2020 1536   VLDL 13 02/04/2017 1550   LDLCALC 121 (H) 12/01/2020 0843   LDLCALC 152 (H) 07/20/2020 1536    WEIGHTS: Wt Readings from  Last 3 Encounters:  12/07/20 176 lb (79.8 kg)  11/17/20 175 lb 3.2 oz (79.5 kg)  08/31/20 171 lb (77.6 kg)    VITALS: BP 128/80   Pulse 83   Ht 5\' 6"  (1.676 m)   Wt 176 lb (79.8 kg)   SpO2 98%   BMI 28.41 kg/m   EXAM: Deferred  EKG: Deferred  ASSESSMENT: 1. Mixed dyslipidemia, high LDL and HDL 2. CAC score of 43, 64th percentile (06/2019) 3. Statin intolerance - myalgias  PLAN: 1.   Mrs. Chaidez continues to have a dyslipidemia  and remains above target.  Unfortunately she cannot tolerate rosuvastatin low-dose every other day 5 mg.  I recommend discontinuing this and staying off of statins for least a month to see if her symptoms completely resolved.  Then ultimately, would recommend starting bempedoic acid.  She should likely be able to get a grant from the [employer] and hopefully get the medicine at no cost.  Repeat lipids in 3 months and follow-up thereafter.  Pixie Casino, MD, [facility], [place] Director of the [facility]  [facility] Diplomate of the American Board of Clinical Lipidology Attending Cardiologist  Direct Dial: [phone]  Fax: [phone]  Website:  [email or web address] 12/07/2020, 9:18 AM

## 2020-12-07 NOTE — Telephone Encounter (Signed)
PA for nexletol 180mg  submitted via [facility] (Key: [id number]) PA Case: [id number], Status: Approved, Coverage Starts on: 09/30/2020 12:00:00 AM, Coverage Ends on: 09/29/2021 12:00:00 AM

## 2020-12-22 ENCOUNTER — Telehealth: Payer: Self-pay | Admitting: Internal Medicine

## 2020-12-22 NOTE — Telephone Encounter (Signed)
Patient notified that samples (3 boxes) of Nexletol 180mg  are available for pick up, to trial  She will pick up on Monday 3/28 She will start around April 10 She was advised to call back to our office with update on how she is doing on medication She will need a lipid clinic f/u scheduled for July/August 2022 with Hilty MD & fasting labs prior  Nexletol is already approved w/insurance

## 2020-12-25 DIAGNOSIS — M170 Bilateral primary osteoarthritis of knee: Secondary | ICD-10-CM | POA: Diagnosis not present

## 2021-01-22 ENCOUNTER — Encounter: Payer: Self-pay | Admitting: Internal Medicine

## 2021-01-22 NOTE — Patient Instructions (Signed)
Due to recent changes in healthcare laws, you may see the results of your imaging and laboratory studies on MyChart before your provider has had a chance to review them.  We understand that in some cases there may be results that are confusing or concerning to you. Not all laboratory results come back in the same time frame and the provider may be waiting for multiple results in order to interpret others.  Please give [place] 48 hours in order for your provider to thoroughly review all the results before contacting the office for clarification of your results.   +++++++++++++++++++++++++  Vit D  & Vit C 1,000 mg   are recommended to help protect  against the Covid-19 and other Corona viruses.    Also it's recommended  to take  Zinc 50 mg  to help  protect against the Covid-19   and best place to get  is also on [email or web address].com  and don't pay more than 6-8 cents /pill !  ================================ Coronavirus (COVID-19) Are you at risk?  Are you at risk for the Coronavirus (COVID-19)?  To be considered HIGH RISK for Coronavirus (COVID-19), you have to meet the following criteria:  . Traveled to [place], [place], [place], [place] or [place]; or in the [place] to [place], [place], [place]  . or [place]; and have fever, cough, and shortness of breath within the last 2 weeks of travel OR . Been in close contact with a person diagnosed with COVID-19 within the last 2 weeks and have  . fever, cough,and shortness of breath .  . IF YOU DO NOT MEET THESE CRITERIA, YOU ARE CONSIDERED LOW RISK FOR COVID-19.  What to do if you are HIGH RISK for COVID-19?  Marland Kitchen If you are having a medical emergency, call 911. . Seek medical care right away. Before you go to a doctor's office, urgent care or emergency department, .  call ahead and tell them about your recent travel, contact with someone diagnosed with COVID-19  .  and your symptoms.  . You should receive instructions from your physician's  office regarding next steps of care.  . When you arrive at healthcare provider, tell the healthcare staff immediately you have returned from  . visiting [place], [place], [place], [place] or [place]; or traveled in the [place] to [place], [place],  . [place] or [place] in the last two weeks or you have been in close contact with a person diagnosed with  . COVID-19 in the last 2 weeks.   . Tell the health care staff about your symptoms: fever, cough and shortness of breath. . After you have been seen by a medical provider, you will be either: o Tested for (COVID-19) and discharged home on quarantine except to seek medical care if  o symptoms worsen, and asked to  - Stay home and avoid contact with others until you get your results (4-5 days)  - Avoid travel on public transportation if possible (such as bus, train, or airplane) or o Sent to the Emergency Department by EMS for evaluation, COVID-19 testing  and  o possible admission depending on your condition and test results.  What to do if you are LOW RISK for COVID-19?  Reduce your risk of any infection by using the same precautions used for avoiding the common cold or flu:  Marland Kitchen Wash your hands often with soap and warm water for at least 20 seconds.  If soap and water are not readily  available,  . use an alcohol-based hand sanitizer with at least 60% alcohol.  . If coughing or sneezing, cover your mouth and nose by coughing or sneezing into the elbow areas of your shirt or coat, .  into a tissue or into your sleeve (not your hands). . Avoid shaking hands with others and consider head nods or verbal greetings only. . Avoid touching your eyes, nose, or mouth with unwashed hands.  . Avoid close contact with people who are sick. . Avoid places or events with large numbers of people in one location, like concerts or sporting events. . Carefully consider travel plans you have or are making. . If you are planning any travel outside or  inside the [place], visit the CDC's Travelers' Health webpage for the latest health notices. . If you have some symptoms but not all symptoms, continue to monitor at home and seek medical attention  . if your symptoms worsen. . If you are having a medical emergency, call 911.   . >>>>>>>>>>>>>>>>>>>>>>>>>>>>>>>>> . We Do NOT Approve of  [facility], [employer]  Our Patients  To Do Home Visits & We Do NOT Approve of [employer] > > > > > > > > > > > > > > > > > > > > > > > > > > > > > > > > > > > > > > >  Preventive Care for Adults  A healthy lifestyle and preventive care can promote health and wellness. Preventive health guidelines for women include the following key practices.  A routine yearly physical is a good way to check with your health care provider about your health and preventive screening. It is a chance to share any concerns and updates on your health and to receive a thorough exam.  Visit your dentist for a routine exam and preventive care every 6 months. Brush your teeth twice a day and floss once a day. Good oral hygiene prevents tooth decay and gum disease.  The frequency of eye exams is based on your age, health, family medical history, use of contact lenses, and other factors. Follow your health care provider's recommendations for frequency of eye exams.  Eat a healthy diet. Foods like vegetables, fruits, whole grains, low-fat dairy products, and lean protein foods contain the nutrients you need without too many calories. Decrease your intake of foods high in solid fats, added sugars, and salt. Eat the right amount of calories for you. Get information about a proper diet from your health care provider, if necessary.  Regular physical exercise is one of the most important things you can do for your health. Most adults should get at least 150 minutes of moderate-intensity exercise (any activity that increases your heart rate and causes you to sweat) each  week. In addition, most adults need muscle-strengthening exercises on 2 or more days a week.  Maintain a healthy weight. The body mass index (BMI) is a screening tool to identify possible weight problems. It provides an estimate of body fat based on height and weight. Your health care provider can find your BMI and can help you achieve or maintain a healthy weight. For adults 20 years and older:  A BMI below 18.5 is considered underweight.  A BMI of 18.5 to 24.9 is normal.  A BMI of 25 to 29.9 is considered overweight.  A BMI of 30 and above is considered obese.  Maintain normal blood lipids and cholesterol levels by exercising and minimizing your intake of  saturated fat. Eat a balanced diet with plenty of fruit and vegetables. If your lipid or cholesterol levels are high, you are over 50, or you are at high risk for heart disease, you may need your cholesterol levels checked more frequently. Ongoing high lipid and cholesterol levels should be treated with medicines if diet and exercise are not working.  If you smoke, find out from your health care provider how to quit. If you do not use tobacco, do not start.  Lung cancer screening is recommended for adults aged 78-80 years who are at high risk for developing lung cancer because of a history of smoking. A yearly low-dose CT scan of the lungs is recommended for people who have at least a 30-pack-year history of smoking and are a current smoker or have quit within the past 15 years. A pack year of smoking is smoking an average of 1 pack of cigarettes a day for 1 year (for example: 1 pack a day for 30 years or 2 packs a day for 15 years). Yearly screening should continue until the smoker has stopped smoking for at least 15 years. Yearly screening should be stopped for people who develop a health problem that would prevent them from having lung cancer treatment.  Avoid use of street drugs. Do not share needles with anyone. Ask for help if you need  support or instructions about stopping the use of drugs.  High blood pressure causes heart disease and increases the risk of stroke.  Ongoing high blood pressure should be treated with medicines if weight loss and exercise do not work.  If you are 14-45 years old, ask your health care provider if you should take aspirin to prevent strokes.  Diabetes screening involves taking a blood sample to check your fasting blood sugar level. This should be done once every 3 years, after age 40, if you are within normal weight and without risk factors for diabetes. Testing should be considered at a younger age or be carried out more frequently if you are overweight and have at least 1 risk factor for diabetes.  Breast cancer screening is essential preventive care for women. You should practice "breast self-awareness." This means understanding the normal appearance and feel of your breasts and may include breast self-examination. Any changes detected, no matter how small, should be reported to a health care provider. Women in their 18s and 30s should have a clinical breast exam (CBE) by a health care provider as part of a regular health exam every 1 to 3 years. After age 71, women should have a CBE every year. Starting at age 39, women should consider having a mammogram (breast X-ray test) every year. Women who have a family history of breast cancer should talk to their health care provider about genetic screening. Women at a high risk of breast cancer should talk to their health care providers about having an MRI and a mammogram every year.  Breast cancer gene (BRCA)-related cancer risk assessment is recommended for women who have family members with BRCA-related cancers. BRCA-related cancers include breast, ovarian, tubal, and peritoneal cancers. Having family members with these cancers may be associated with an increased risk for harmful changes (mutations) in the breast cancer genes BRCA1 and BRCA2. Results of the  assessment will determine the need for genetic counseling and BRCA1 and BRCA2 testing.  Routine pelvic exams to screen for cancer are no longer recommended for nonpregnant women who are considered low risk for cancer of the pelvic organs (ovaries,  uterus, and vagina) and who do not have symptoms. Ask your health care provider if a screening pelvic exam is right for you.  If you have had past treatment for cervical cancer or a condition that could lead to cancer, you need Pap tests and screening for cancer for at least 20 years after your treatment. If Pap tests have been discontinued, your risk factors (such as having a new sexual partner) need to be reassessed to determine if screening should be resumed. Some women have medical problems that increase the chance of getting cervical cancer. In these cases, your health care provider may recommend more frequent screening and Pap tests.    Colorectal cancer can be detected and often prevented. Most routine colorectal cancer screening begins at the age of 62 years and continues through age 28 years. However, your health care provider may recommend screening at an earlier age if you have risk factors for colon cancer. On a yearly basis, your health care provider may provide home test kits to check for hidden blood in the stool. Use of a small camera at the end of a tube, to directly examine the colon (sigmoidoscopy or colonoscopy), can detect the earliest forms of colorectal cancer. Talk to your health care provider about this at age 23, when routine screening begins.  Direct exam of the colon should be repeated every 5-10 years through age 31 years, unless early forms of pre-cancerous polyps or small growths are found.  Osteoporosis is a disease in which the bones lose minerals and strength with aging. This can result in serious bone fractures or breaks. The risk of osteoporosis can be identified using a bone density scan. Women ages 83 years and over and women  at risk for fractures or osteoporosis should discuss screening with their health care providers. Ask your health care provider whether you should take a calcium supplement or vitamin D to reduce the rate of osteoporosis.  Menopause can be associated with physical symptoms and risks. Hormone replacement therapy is available to decrease symptoms and risks. You should talk to your health care provider about whether hormone replacement therapy is right for you.  Use sunscreen. Apply sunscreen liberally and repeatedly throughout the day. You should seek shade when your shadow is shorter than you. Protect yourself by wearing long sleeves, pants, a wide-brimmed hat, and sunglasses year round, whenever you are outdoors.  Once a month, do a whole body skin exam, using a mirror to look at the skin on your back. Tell your health care provider of new moles, moles that have irregular borders, moles that are larger than a pencil eraser, or moles that have changed in shape or color.  Stay current with required vaccines (immunizations).  Influenza vaccine. All adults should be immunized every year.  Tetanus, diphtheria, and acellular pertussis (Td, Tdap) vaccine. Pregnant women should receive 1 dose of Tdap vaccine during each pregnancy. The dose should be obtained regardless of the length of time since the last dose. Immunization is preferred during the 27th-36th week of gestation. An adult who has not previously received Tdap or who does not know her vaccine status should receive 1 dose of Tdap. This initial dose should be followed by tetanus and diphtheria toxoids (Td) booster doses every 10 years. Adults with an unknown or incomplete history of completing a 3-dose immunization series with Td-containing vaccines should begin or complete a primary immunization series including a Tdap dose. Adults should receive a Td booster every 10 years.  Zoster vaccine. One dose is recommended for adults aged 74 years or  older unless certain conditions are present.    Pneumococcal 13-valent conjugate (PCV13) vaccine. When indicated, a person who is uncertain of her immunization history and has no record of immunization should receive the PCV13 vaccine. An adult aged 21 years or older who has certain medical conditions and has not been previously immunized should receive 1 dose of PCV13 vaccine. This PCV13 should be followed with a dose of pneumococcal polysaccharide (PPSV23) vaccine. The PPSV23 vaccine dose should be obtained at least 1 or more year(s) after the dose of PCV13 vaccine. An adult aged 25 years or older who has certain medical conditions and previously received 1 or more doses of PPSV23 vaccine should receive 1 dose of PCV13. The PCV13 vaccine dose should be obtained 1 or more years after the last PPSV23 vaccine dose.    Pneumococcal polysaccharide (PPSV23) vaccine. When PCV13 is also indicated, PCV13 should be obtained first. All adults aged 84 years and older should be immunized. An adult younger than age 1 years who has certain medical conditions should be immunized. Any person who resides in a nursing home or long-term care facility should be immunized. An adult smoker should be immunized. People with an immunocompromised condition and certain other conditions should receive both PCV13 and PPSV23 vaccines. People with human immunodeficiency virus (HIV) infection should be immunized as soon as possible after diagnosis. Immunization during chemotherapy or radiation therapy should be avoided. Routine use of PPSV23 vaccine is not recommended for American Indians, [place] Natives, or people younger than 65 years unless there are medical conditions that require PPSV23 vaccine. When indicated, people who have unknown immunization and have no record of immunization should receive PPSV23 vaccine. One-time revaccination 5 years after the first dose of PPSV23 is recommended for people aged 19-64 years who have chronic  kidney failure, nephrotic syndrome, asplenia, or immunocompromised conditions. People who received 1-2 doses of PPSV23 before age 84 years should receive another dose of PPSV23 vaccine at age 54 years or later if at least 5 years have passed since the previous dose. Doses of PPSV23 are not needed for people immunized with PPSV23 at or after age 14 years.   Preventive Services / Frequency  Ages 16 years and over  Blood pressure check.  Lipid and cholesterol check.  Lung cancer screening. / Every year if you are aged 66-80 years and have a 30-pack-year history of smoking and currently smoke or have quit within the past 15 years. Yearly screening is stopped once you have quit smoking for at least 15 years or develop a health problem that would prevent you from having lung cancer treatment.  Clinical breast exam.** / Every year after age 31 years.   BRCA-related cancer risk assessment.** / For women who have family members with a BRCA-related cancer (breast, ovarian, tubal, or peritoneal cancers).  Mammogram.** / Every year beginning at age 45 years and continuing for as long as you are in good health. Consult with your health care provider.  Pap test.** / Every 3 years starting at age 52 years through age 47 or 52 years with 3 consecutive normal Pap tests. Testing can be stopped between 65 and 70 years with 3 consecutive normal Pap tests and no abnormal Pap or HPV tests in the past 10 years.  Fecal occult blood test (FOBT) of stool. / Every year beginning at age 35 years and continuing until age 7 years. You may not need to  do this test if you get a colonoscopy every 10 years.  Flexible sigmoidoscopy or colonoscopy.** / Every 5 years for a flexible sigmoidoscopy or every 10 years for a colonoscopy beginning at age 21 years and continuing until age 56 years.  Hepatitis C blood test.** / For all people born from [birth year] through 1964 and any individual with known risks for hepatitis  C.  Osteoporosis screening.** / A one-time screening for women ages 53 years and over and women at risk for fractures or osteoporosis.  Skin self-exam. / Monthly.  Influenza vaccine. / Every year.  Tetanus, diphtheria, and acellular pertussis (Tdap/Td) vaccine.** / 1 dose of Td every 10 years.  Zoster vaccine.** / 1 dose for adults aged 32 years or older.  Pneumococcal 13-valent conjugate (PCV13) vaccine.** / Consult your health care provider.  Pneumococcal polysaccharide (PPSV23) vaccine.** / 1 dose for all adults aged 67 years and older. Screening for abdominal aortic aneurysm (AAA)  by ultrasound is recommended for people who have history of high blood pressure or who are current or former smokers. ++++++++++++++++++++ Recommend Adult Low Dose Aspirin or  coated  Aspirin 81 mg daily  To reduce risk of Colon Cancer 40 %,  Skin Cancer 26 % ,  Melanoma 46%  and  Pancreatic cancer 60% ++++++++++++++++++++ Vitamin D goal  is between 70-100.  Please make sure that you are taking your Vitamin D as directed.  It is very important as a natural anti-inflammatory  helping hair, skin, and nails, as well as reducing stroke and heart attack risk.  It helps your bones and helps with mood. It also decreases numerous cancer risks so please take it as directed.  Low Vit D is associated with a 200-300% higher risk for CANCER  and 200-300% higher risk for HEART   ATTACK  &  STROKE.   .....................................Marland Kitchen It is also associated with higher death rate at younger ages,  autoimmune diseases like Rheumatoid arthritis, Lupus, Multiple Sclerosis.    Also many other serious conditions, like depression, Alzheimer's Dementia, infertility, muscle aches, fatigue, fibromyalgia - just to name a few. ++++++++++++++++++ Recommend the book "The END of DIETING" by Dr Excell Seltzer  & the book "The END of DIABETES " by Dr Excell Seltzer At [email or web address] - get book & Audio CD's    Being diabetic has  a  300% increased risk for heart attack, stroke, cancer, and alzheimer- type vascular dementia. It is very important that you work harder with diet by avoiding all foods that are white. Avoid white rice (brown & wild rice is OK), white potatoes (sweetpotatoes in moderation is OK), White bread or wheat bread or anything made out of white flour like bagels, donuts, rolls, buns, biscuits, cakes, pastries, cookies, pizza crust, and pasta (made from white flour & egg whites) - vegetarian pasta or spinach or wheat pasta is OK. Multigrain breads like Arnold's or [employer], or multigrain sandwich thins or flatbreads.  Diet, exercise and weight loss can reverse and cure diabetes in the early stages.  Diet, exercise and weight loss is very important in the control and prevention of complications of diabetes which affects every system in your body, ie. Brain - dementia/stroke, eyes - glaucoma/blindness, heart - heart attack/heart failure, kidneys - dialysis, stomach - gastric paralysis, intestines - malabsorption, nerves - severe painful neuritis, circulation - gangrene & loss of a leg(s), and finally cancer and Alzheimers.    I recommend avoid fried & greasy foods,  sweets/candy, white rice (brown or wild  rice or Quinoa is OK), white potatoes (sweet potatoes are OK) - anything made from white flour - bagels, doughnuts, rolls, buns, biscuits,white and wheat breads, pizza crust and traditional pasta made of white flour & egg white(vegetarian pasta or spinach or wheat pasta is OK).  Multi-grain bread is OK - like multi-grain flat bread or sandwich thins. Avoid alcohol in excess. Exercise is also important.    Eat all the vegetables you want - avoid meat, especially red meat and dairy - especially cheese.  Cheese is the most concentrated form of trans-fats which is the worst thing to clog up our arteries. Veggie cheese is OK which can be found in the fresh produce section at [employer] or [employer] or  [employer]  +++++++++++++++++++ DASH Eating Plan  DASH stands for "Dietary Approaches to Stop Hypertension."   The DASH eating plan is a healthy eating plan that has been shown to reduce high blood pressure (hypertension). Additional health benefits may include reducing the risk of type 2 diabetes mellitus, heart disease, and stroke. The DASH eating plan may also help with weight loss. WHAT DO I NEED TO KNOW ABOUT THE DASH EATING PLAN? For the DASH eating plan, you will follow these general guidelines:  Choose foods with a percent daily value for sodium of less than 5% (as listed on the food label).  Use salt-free seasonings or herbs instead of table salt or sea salt.  Check with your health care provider or pharmacist before using salt substitutes.  Eat lower-sodium products, often labeled as "lower sodium" or "no salt added."  Eat fresh foods.  Eat more vegetables, fruits, and low-fat dairy products.  Choose whole grains. Look for the word "whole" as the first word in the ingredient list.  Choose fish   Limit sweets, desserts, sugars, and sugary drinks.  Choose heart-healthy fats.  Eat veggie cheese   Eat more home-cooked food and less restaurant, buffet, and fast food.  Limit fried foods.  Cook foods using methods other than frying.  Limit canned vegetables. If you do use them, rinse them well to decrease the sodium.  When eating at a restaurant, ask that your food be prepared with less salt, or no salt if possible.                      WHAT FOODS CAN I EAT? Read Dr Fara Olden Fuhrman's books on The End of Dieting & The End of Diabetes  Grains Whole grain or whole wheat bread. Brown rice. Whole grain or whole wheat pasta. Quinoa, bulgur, and whole grain cereals. Low-sodium cereals. Corn or whole wheat flour tortillas. Whole grain cornbread. Whole grain crackers. Low-sodium crackers.  Vegetables Fresh or frozen vegetables (raw, steamed, roasted, or grilled). Low-sodium or  reduced-sodium tomato and vegetable juices. Low-sodium or reduced-sodium tomato sauce and paste. Low-sodium or reduced-sodium canned vegetables.   Fruits All fresh, canned (in natural juice), or frozen fruits.  Protein Products  All fish and seafood.  Dried beans, peas, or lentils. Unsalted nuts and seeds. Unsalted canned beans.  Dairy Low-fat dairy products, such as skim or 1% milk, 2% or reduced-fat cheeses, low-fat ricotta or cottage cheese, or plain low-fat yogurt. Low-sodium or reduced-sodium cheeses.  Fats and Oils Tub margarines without trans fats. Light or reduced-fat mayonnaise and salad dressings (reduced sodium). Avocado. Safflower, olive, or canola oils. Natural peanut or almond butter.  Other Unsalted popcorn and pretzels. The items listed above may not be a complete list of recommended foods  or beverages. Contact your dietitian for more options.  +++++++++++++++  WHAT FOODS ARE NOT RECOMMENDED? Grains/ White flour or wheat flour White bread. White pasta. White rice. Refined cornbread. Bagels and croissants. Crackers that contain trans fat.  Vegetables  Creamed or fried vegetables. Vegetables in a . Regular canned vegetables. Regular canned tomato sauce and paste. Regular tomato and vegetable juices.  Fruits Dried fruits. Canned fruit in light or heavy syrup. Fruit juice.  Meat and Other Protein Products Meat in general - RED meat & White meat.  Fatty cuts of meat. Ribs, chicken wings, all processed meats as bacon, sausage, bologna, salami, fatback, hot dogs, bratwurst and packaged luncheon meats.  Dairy Whole or 2% milk, cream, half-and-half, and cream cheese. Whole-fat or sweetened yogurt. Full-fat cheeses or blue cheese. Non-dairy creamers and whipped toppings. Processed cheese, cheese spreads, or cheese curds.  Condiments Onion and garlic salt, seasoned salt, table salt, and sea salt. Canned and packaged gravies. Worcestershire sauce. Tartar sauce. Barbecue  sauce. Teriyaki sauce. Soy sauce, including reduced sodium. Steak sauce. Fish sauce. Oyster sauce. Cocktail sauce. Horseradish. Ketchup and mustard. Meat flavorings and tenderizers. Bouillon cubes. Hot sauce. Tabasco sauce. Marinades. Taco seasonings. Relishes.  Fats and Oils Butter, stick margarine, lard, shortening and bacon fat. Coconut, palm kernel, or palm oils. Regular salad dressings.  Pickles and olives. Salted popcorn and pretzels.  The items listed above may not be a complete list of foods and beverages to avoid.

## 2021-01-22 NOTE — Progress Notes (Signed)
Annual Screening/Preventative Visit & Comprehensive Evaluation &  Examination   Future Appointments  Date Time Provider [place]  01/23/2021 10:00 AM [name], MD [facility] None  04/26/2021  9:00 AM Liane Comber, NP [facility] None  05/18/2021  9:00 AM Philemon Kingdom, MD [facility] None  01/23/2022  2:00 PM [name], MD [facility] None        This very nice 75 y.o. DBF presents for a Screening /Preventative Visit & comprehensive evaluation and management of multiple medical co-morbidities.  Patient has been followed for  Labile HTN, HLD, T2_NIDDM  Prediabetes  and Vitamin D Deficiency.       Labile HTN predates circa 2012. Patient's BP has been controlled at home and patient denies any cardiac symptoms as chest pain, palpitations, shortness of breath, dizziness or ankle swelling. Today's BP is at goal 122/82       Patient's hyperlipidemia is not controlled with diet as patient is very reticent to take cholesterol medications. Patient was given sx's Nexletol from  Dr Debara Pickett to try. Last lipids were not at goal:  Lab Results  Component Value Date   CHOL 247 (H) 12/01/2020   HDL 118 12/01/2020   LDLCALC 121 (H) 12/01/2020   TRIG 50 12/01/2020   CHOLHDL 2.1 12/01/2020        Patient has hx/o prediabetes predating (A1c 5.7% /2012).  Patient denies reactive hypoglycemic symptoms, visual blurring, diabetic polys or paresthesias. 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                              Finally, patient has history of Vitamin D Deficiency ("10" /2010& "8" /2018)  and refuses to take recommended doses of Vitamin D & last Vitamin D was still very low .  Lab Results  Component Value Date   VD25OH 26.0 (L) 11/17/2020    Current Outpatient Medications  on File Prior to Visit  Medication Sig  . Acetaminophen 325 MG CAPS as needed.  Marland Kitchen NEXLETOL 180 MG TABS Take 1 tablet by mouth daily.  Marland Kitchen FIORICET TAKE 1 TABLET  EVERY 4 HRS AS NEEDED  . cetirizine-pseudoephedrine  5-120 MG tabu Take 1 tablet  2  times daily.  Marland Kitchen VITAMIN D 1000 u Take 1,000 Units  daily.  Marland Kitchen FLONASE  nasal spray INSTILL 1-2 SPRAYS INTO  NOSTRILS TWICE DAILY   . levothyroxine  50 MCG tablet TAKE 1 AND 1/2 TABLETS BY MOUTH DAILY  . meclizine (ANTIVERT 25 MG tablet Take      1 tablet      3 x day       if needed for Dizziness /Vertigo  . naproxen  220 MG tablet Take 440 mg by mouth 2 (two) times daily as needed (pain).  . Rimegepant  (NURTEC) 75 MG Take 1 tab as needed with onset of migraine symptoms, up to 15 times in a month.     Allergies  Allergen Reactions  . Doxycycline Itching    itching  . Gabapentin Nausea And Vomiting  . Penicillins Hives  . Pravastatin     Myalgia  . Rosuvastatin Other (See Comments)    Joint/muscle aches, fatigue  . Sulfa Antibiotics Hives  . Zetia [Ezetimibe]     Myalgia    Past Medical  History:  Diagnosis Date  . Blind right eye    legally  . 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 High cholesterol   . Hypertension   . Hypothyroidism   . Migraine   . Prediabetes   . Sarcoidosis, lung ([place]) 15 YRS AGO   NO TREATMENTS  . 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 Vertigo   . Vitamin D deficiency     Health Maintenance  Topic Date Due  . COVID-19 Vaccine (3 - Booster for Pfizer series) 06/26/2020  . DEXA SCAN  04/19/2021 (Originally 01/07/2012)  . INFLUENZA VACCINE  04/30/2021  . MAMMOGRAM  07/25/2021  . TETANUS/TDAP  10/15/2021  . Fecal DNA (Cologuard)  04/22/2023  . Hepatitis C Screening  Completed  . PNA vac Low Risk Adult  Completed  . HPV VACCINES  Aged Out    Immunization History  Administered Date(s) Administered  . Influenza Split 06/30/2013  . Influenza, High Dose Seasonal PF 06/26/2015, 07/25/2016, 05/26/2017,  06/30/2018, 06/10/2019, 06/26/2020  . PFIZER   SARS-COV-2 Vacc 11/26/2019, 12/25/2019  . Pneumococcal Conjugate-13 09/14/2014  . Pneumococcal Polysaccharide-23 10/16/2011, 02/04/2017  . Td 10/16/2011  . Zoster 01/14/2007    Last Colon - 07/15/2005 - Dr Carlean Purl - recc 5 year f/u. - Sent recall letter 04/23/2016 -  Patient aware OVERDUE.   Last MGM - 07/25/2020  Past Surgical History:  Procedure Laterality Date  . BACK SURGERY  1970  . CATARACT EXTRACTION Left 12/2017  . COLONOSCOPY  2006; 03/28/11   2 small adenomas; normal  . EYE SURGERY  1964   right eye  . KNEE ARTHROSCOPY  2002  . NASAL SEPTUM SURGERY  1998  . TOTAL ABDOMINAL HYSTERECTOMY  2000    Family History  Problem Relation Age of Onset  . Lung cancer Sister 75  . Throat cancer Brother 76  . Stroke Mother   . Emphysema Father   . COPD Sister   . Diabetes Brother   . Colon cancer Neg Hx     Social History   Tobacco Use  . Smoking status: Never Smoker  . Smokeless tobacco: Never Used  Vaping Use  . Vaping Use: Never used  Substance Use Topics  . Alcohol use: Never    Alcohol/week: 1.0 standard drink    Types: 1 Standard drinks or equivalent per week    Comment: RARE  . Drug use: Never    ROS Constitutional: Denies fever, chills, weight loss/gain, headaches, insomnia,  night sweats, and change in appetite. Does c/o fatigue. Eyes: Denies redness, blurred vision, diplopia, discharge, itchy, watery eyes.  ENT: Denies discharge, congestion, post nasal drip, epistaxis, sore throat, earache, hearing loss, dental pain, Tinnitus, Vertigo, Sinus pain, snoring.  Cardio: Denies chest pain, palpitations, irregular heartbeat, syncope, dyspnea, diaphoresis, orthopnea, PND, claudication, edema Respiratory: denies cough, dyspnea, DOE, pleurisy, hoarseness, laryngitis, wheezing.  Gastrointestinal: Denies dysphagia, heartburn, reflux, water brash, pain, cramps, nausea, vomiting, bloating, diarrhea, constipation,  hematemesis, melena, hematochezia, jaundice, hemorrhoids Genitourinary: Denies dysuria, frequency, urgency, nocturia, hesitancy, discharge, hematuria, flank pain Breast: Breast lumps, nipple discharge, bleeding.  Musculoskeletal: Denies arthralgia, myalgia, stiffness, Jt. Swelling, pain, limp, and strain/sprain. Denies falls. Skin: Denies puritis, rash, hives, warts, acne, eczema, changing in skin lesion Neuro: No weakness, tremor, incoordination, spasms, paresthesia, pain Psychiatric: Denies confusion, memory loss, sensory loss. Denies Depression. Endocrine: Denies change in weight, skin, hair change, nocturia, and paresthesia, diabetic polys, visual blurring, hyper / hypo glycemic episodes.  Heme/Lymph: No excessive bleeding, bruising, enlarged lymph nodes.  Physical Exam  BP  122/82   Pulse 90   Temp 97.7 F (36.5 C)   Resp 16   Ht 5\' 6"  (1.676 m)   Wt 173 lb 12.8 oz (78.8 kg)   SpO2 98%   BMI 28.05 kg/m   General Appearance: Well nourished, well groomed and in no apparent distress.  Eyes: PERRLA, EOMs, conjunctiva no swelling or erythema, normal fundi and vessels. Sinuses: No frontal/maxillary tenderness ENT/Mouth: EACs patent / TMs  nl. Nares clear without erythema, swelling, mucoid exudates. Oral hygiene is good. No erythema, swelling, or exudate. Tongue normal, non-obstructing. Tonsils not swollen or erythematous. Hearing normal.  Neck: Supple, thyroid not palpable. No bruits, nodes or JVD. Respiratory: Respiratory effort normal.  BS equal and clear bilateral without rales, rhonci, wheezing or stridor. Cardio: Heart sounds are normal with regular rate and rhythm and no murmurs, rubs or gallops. Peripheral pulses are normal and equal bilaterally without edema. No aortic or femoral bruits. Chest: symmetric with normal excursions and percussion. Breasts: Symmetric, without lumps, nipple discharge, retractions, or fibrocystic changes.  Abdomen: Flat, soft with bowel sounds active.  Nontender, no guarding, rebound, hernias, masses, or organomegaly.  Lymphatics: Non tender without lymphadenopathy.  Musculoskeletal: Full ROM all peripheral extremities, joint stability, 5/5 strength, and normal gait. Skin: Warm and dry without rashes, lesions, cyanosis, clubbing or  ecchymosis.  Neuro: Cranial nerves intact, reflexes equal bilaterally. Normal muscle tone, no cerebellar symptoms. Sensation intact.  Pysch: Alert and oriented x 3, normal affect, Insight and Judgment appropriate.   Assessment and Plan  1. Annual Preventative Screening Examination   2. Labile hypertension  - EKG 12-Lead - Urinalysis, Routine w reflex microscopic - Microalbumin / creatinine urine ratio - CBC with Differential/Platelet - COMPLETE METABOLIC PANEL WITH GFR - Magnesium  3. Hyperlipidemia, mixed  - EKG 12-Lead  4. Abnormal glucose  - EKG 12-Lead - Hemoglobin A1c - Insulin, random  5. Vitamin D deficiency   6. Postablative hypothyroidism   7. Poor compliance with medication   8. Statin myopathy   9. Screening for colorectal cancer  - POC Hemoccult Bld/Stl (3-Cd Home Screen); Future  10. Screening for ischemic heart disease  - EKG 12-Lead  11. FH: hypertension  - EKG 12-Lead  12. Medication management  - Urinalysis, Routine w reflex microscopic - Microalbumin / creatinine urine ratio - CBC with Differential/Platelet - COMPLETE METABOLIC PANEL WITH GFR - Magnesium - Hemoglobin A1c - Insulin, random         Patient was counseled in prudent diet to achieve/maintain BMI less than 25 for weight control, BP monitoring, regular exercise and medications. Discussed med's effects and SE's. Screening labs and tests as requested with regular follow-up as recommended. Over 40 minutes of exam, counseling, chart review and high complex critical decision making was performed.   Kirtland Bouchard, MD

## 2021-01-23 ENCOUNTER — Other Ambulatory Visit: Payer: Self-pay | Admitting: Internal Medicine

## 2021-01-23 ENCOUNTER — Other Ambulatory Visit: Payer: Self-pay

## 2021-01-23 ENCOUNTER — Ambulatory Visit (INDEPENDENT_AMBULATORY_CARE_PROVIDER_SITE_OTHER): Payer: Medicare PPO | Admitting: Internal Medicine

## 2021-01-23 VITALS — BP 122/82 | HR 90 | Temp 97.7°F | Resp 16 | Ht 66.0 in | Wt 173.8 lb

## 2021-01-23 DIAGNOSIS — Z1211 Encounter for screening for malignant neoplasm of colon: Secondary | ICD-10-CM

## 2021-01-23 DIAGNOSIS — E890 Postprocedural hypothyroidism: Secondary | ICD-10-CM

## 2021-01-23 DIAGNOSIS — Z1212 Encounter for screening for malignant neoplasm of rectum: Secondary | ICD-10-CM

## 2021-01-23 DIAGNOSIS — Z0001 Encounter for general adult medical examination with abnormal findings: Secondary | ICD-10-CM

## 2021-01-23 DIAGNOSIS — Z91148 Patient's other noncompliance with medication regimen for other reason: Secondary | ICD-10-CM

## 2021-01-23 DIAGNOSIS — Z136 Encounter for screening for cardiovascular disorders: Secondary | ICD-10-CM

## 2021-01-23 DIAGNOSIS — E559 Vitamin D deficiency, unspecified: Secondary | ICD-10-CM

## 2021-01-23 DIAGNOSIS — Z79899 Other long term (current) drug therapy: Secondary | ICD-10-CM

## 2021-01-23 DIAGNOSIS — E782 Mixed hyperlipidemia: Secondary | ICD-10-CM

## 2021-01-23 DIAGNOSIS — R7309 Other abnormal glucose: Secondary | ICD-10-CM

## 2021-01-23 DIAGNOSIS — Z0000 Encounter for general adult medical examination without abnormal findings: Secondary | ICD-10-CM

## 2021-01-23 DIAGNOSIS — G720 Drug-induced myopathy: Secondary | ICD-10-CM

## 2021-01-23 DIAGNOSIS — R0989 Other specified symptoms and signs involving the circulatory and respiratory systems: Secondary | ICD-10-CM

## 2021-01-23 DIAGNOSIS — Z8249 Family history of ischemic heart disease and other diseases of the circulatory system: Secondary | ICD-10-CM

## 2021-01-23 DIAGNOSIS — Z9114 Patient's other noncompliance with medication regimen: Secondary | ICD-10-CM

## 2021-01-23 DIAGNOSIS — T466X5A Adverse effect of antihyperlipidemic and antiarteriosclerotic drugs, initial encounter: Secondary | ICD-10-CM

## 2021-01-23 MED ORDER — HYDROCORTISONE-ACETIC ACID 1-2 % OT SOLN: OTIC | 99 refills | Status: DC

## 2021-01-23 MED ORDER — AZELAIC ACID 15 % EX GEL: CUTANEOUS | 99 refills | Status: DC

## 2021-01-23 MED ORDER — METRONIDAZOLE 1 % EX GEL: Freq: Every day | CUTANEOUS | 3 refills | Status: DC

## 2021-01-24 LAB — COMPLETE METABOLIC PANEL WITH GFR
AG Ratio: 1.9 (calc) (ref 1.0–2.5)
ALT: 13 U/L (ref 6–29)
AST: 19 U/L (ref 10–35)
Albumin: 4.3 g/dL (ref 3.6–5.1)
Alkaline phosphatase (APISO): 92 U/L (ref 37–153)
BUN: 12 mg/dL (ref 7–25)
CO2: 28 mmol/L (ref 20–32)
Calcium: 9.3 mg/dL (ref 8.6–10.4)
Chloride: 107 mmol/L (ref 98–110)
Creat: 0.74 mg/dL (ref 0.60–0.93)
GFR, Est African American: 92 mL/min/{1.73_m2} (ref >=60)
GFR, Est Non African American: 80 mL/min/{1.73_m2} (ref >=60)
Globulin: 2.3 g/dL (calc) (ref 1.9–3.7)
Glucose, Bld: 85 mg/dL (ref 65–99)
Potassium: 4.2 mmol/L (ref 3.5–5.3)
Sodium: 142 mmol/L (ref 135–146)
Total Bilirubin: 0.4 mg/dL (ref 0.2–1.2)
Total Protein: 6.6 g/dL (ref 6.1–8.1)

## 2021-01-24 LAB — CBC WITH DIFFERENTIAL/PLATELET
Absolute Monocytes: 390 cells/uL (ref 200–950)
Basophils Absolute: 42 cells/uL (ref 0–200)
Basophils Relative: 0.8 %
Eosinophils Absolute: 42 cells/uL (ref 15–500)
Eosinophils Relative: 0.8 %
HCT: 36.4 % (ref 35.0–45.0)
Hemoglobin: 11.7 g/dL (ref 11.7–15.5)
Lymphs Abs: 1409 cells/uL (ref 850–3900)
MCH: 27.7 pg (ref 27.0–33.0)
MCHC: 32.1 g/dL (ref 32.0–36.0)
MCV: 86.3 fL (ref 80.0–100.0)
MPV: 9.7 fL (ref 7.5–12.5)
Monocytes Relative: 7.5 %
Neutro Abs: 3318 cells/uL (ref 1500–7800)
Neutrophils Relative %: 63.8 %
Platelets: 341 10*3/uL (ref 140–400)
RBC: 4.22 10*6/uL (ref 3.80–5.10)
RDW: 13.3 % (ref 11.0–15.0)
Total Lymphocyte: 27.1 %
WBC: 5.2 10*3/uL (ref 3.8–10.8)

## 2021-01-24 LAB — URINALYSIS, ROUTINE W REFLEX MICROSCOPIC
Bacteria, UA: NONE SEEN /HPF
Bilirubin Urine: NEGATIVE
Glucose, UA: NEGATIVE
Hyaline Cast: NONE SEEN /LPF
Ketones, ur: NEGATIVE
Leukocytes,Ua: NEGATIVE
Nitrite: NEGATIVE
Protein, ur: NEGATIVE
Specific Gravity, Urine: 1.026 (ref 1.001–1.035)
Squamous Epithelial / HPF: NONE SEEN /HPF (ref <=5)
WBC, UA: NONE SEEN /HPF (ref 0–5)
pH: <=5 (ref 5.0–8.0)

## 2021-01-24 LAB — HEMOGLOBIN A1C
Hgb A1c MFr Bld: 5.3 % of total Hgb (ref <5.7)
Mean Plasma Glucose: 105 mg/dL
eAG (mmol/L): 5.8 mmol/L

## 2021-01-24 LAB — MAGNESIUM: Magnesium: 2.3 mg/dL (ref 1.5–2.5)

## 2021-01-24 LAB — MICROALBUMIN / CREATININE URINE RATIO
Creatinine, Urine: 173 mg/dL (ref 20–275)
Microalb Creat Ratio: 10 mcg/mg creat (ref <30)
Microalb, Ur: 1.7 mg/dL

## 2021-01-24 LAB — MICROSCOPIC MESSAGE

## 2021-01-24 LAB — INSULIN, RANDOM: Insulin: 4.6 u[IU]/mL

## 2021-01-24 NOTE — Progress Notes (Signed)
============================================================ ============================================================  -   A1c - Normal - Great - No Diabetes ============================================================ ============================================================  - All Else - CBC - Kidneys - Electrolytes - Liver - Magnesium & Thyroid   - all Normal / OK ===========================================================

## 2021-01-25 ENCOUNTER — Encounter: Payer: Self-pay | Admitting: *Deleted

## 2021-02-13 ENCOUNTER — Telehealth: Payer: Self-pay | Admitting: Internal Medicine

## 2021-02-13 NOTE — Telephone Encounter (Signed)
Relayed info from MD to patient. She will start medication tonight. Advised will check in with her in a few weeks to see how she is doing. Explained if tolerated, she should apply for co-pay grant from [place] (as detailed in her March 2022 AVS) and contact her pharmacy to get med filled.

## 2021-02-13 NOTE — Telephone Encounter (Signed)
Called patient to check how she is doing on Nexletol - per previous notes, had provided samples (she picked up) and medication is approved. We had discussed that I would check in with her to see how she is tolerating medication.   Patient states she has not started Nexletol. She read all the side effects and is concerned she will have similar side effects that she had with statins (myalgias). She states she lives alone, needs to be able to go upstairs, and is leery that Nexletol would aggravate her arthritis.   She would like to know if MD would be OK with her taking QOD to see if tolerated, plan for fasting labs in 3 months and follow up after. Or if he has other suggestions.

## 2021-02-13 NOTE — Telephone Encounter (Signed)
We don't have data on QOD dosing - would advise she start taking it - highly unlikely to have side effects, but if they do develop, they would be reversible when stopping the medicine.  Dr Lemmie Evens

## 2021-03-01 ENCOUNTER — Telehealth: Payer: Self-pay | Admitting: Internal Medicine

## 2021-03-01 NOTE — Telephone Encounter (Signed)
Left message to call to discuss how she is doing with Nexletol - reference 02/13/2021 phone note.

## 2021-03-04 ENCOUNTER — Other Ambulatory Visit: Payer: Self-pay | Admitting: Adult Health

## 2021-03-04 DIAGNOSIS — G44219 Episodic tension-type headache, not intractable: Secondary | ICD-10-CM

## 2021-03-05 NOTE — Telephone Encounter (Signed)
Patient was returning Jenna's call

## 2021-03-05 NOTE — Telephone Encounter (Signed)
Spoke with patient of Dr. Debara Pickett   She reports 2 "extra bad" migraines, aches on Nexletol (she took for 1 week) She states she can no longer take this medicationo She would feel better taking a nutrition class, with an injection, or even going back to crestor QOD  Per review of chart, patient has clinical ASCVD and has tried/failed 2 statins, zetia, nexletol   Advised patient will send a message to Dr. Debara Pickett to review/advise

## 2021-03-06 ENCOUNTER — Other Ambulatory Visit: Payer: Self-pay

## 2021-03-06 DIAGNOSIS — Z1211 Encounter for screening for malignant neoplasm of colon: Secondary | ICD-10-CM

## 2021-03-06 DIAGNOSIS — Z1212 Encounter for screening for malignant neoplasm of rectum: Secondary | ICD-10-CM

## 2021-03-06 LAB — POC HEMOCCULT BLD/STL (HOME/3-CARD/SCREEN)
Card #2 Fecal Occult Blod, POC: NEGATIVE
Card #3 Fecal Occult Blood, POC: NEGATIVE
Fecal Occult Blood, POC: NEGATIVE

## 2021-03-12 DIAGNOSIS — Z1211 Encounter for screening for malignant neoplasm of colon: Secondary | ICD-10-CM | POA: Diagnosis not present

## 2021-03-12 DIAGNOSIS — Z1212 Encounter for screening for malignant neoplasm of rectum: Secondary | ICD-10-CM | POA: Diagnosis not present

## 2021-03-12 NOTE — Telephone Encounter (Signed)
Message  We could try Repatha - argument is that she does have coronary calcium and cannot take anything else.    Dr Lemmie Evens

## 2021-03-14 DIAGNOSIS — Z961 Presence of intraocular lens: Secondary | ICD-10-CM | POA: Diagnosis not present

## 2021-03-14 DIAGNOSIS — H31011 Macula scars of posterior pole (postinflammatory) (post-traumatic), right eye: Secondary | ICD-10-CM | POA: Diagnosis not present

## 2021-03-14 DIAGNOSIS — H04123 Dry eye syndrome of bilateral lacrimal glands: Secondary | ICD-10-CM | POA: Diagnosis not present

## 2021-03-14 DIAGNOSIS — H1010 Acute atopic conjunctivitis, unspecified eye: Secondary | ICD-10-CM | POA: Diagnosis not present

## 2021-03-14 DIAGNOSIS — H2701 Aphakia, right eye: Secondary | ICD-10-CM | POA: Diagnosis not present

## 2021-03-19 NOTE — Telephone Encounter (Addendum)
Spoke with patient who would like to try PCSK9 Explained PA will be needed with insurance and I would work on then, call her next week

## 2021-03-26 ENCOUNTER — Telehealth: Payer: Self-pay | Admitting: Internal Medicine

## 2021-03-26 NOTE — Telephone Encounter (Signed)
PA for repatha sureclick submitted via C[facility] (Key: [id number])

## 2021-03-28 NOTE — Telephone Encounter (Signed)
PA Case: [id number], Status: Approved, Coverage Starts on: 03/26/2021 12:00:00 AM, Coverage Ends on: 09/22/2021 12:00:00 AM

## 2021-03-28 NOTE — Telephone Encounter (Deleted)
PA Case: [id number], Status: Approved, Coverage Starts on: 03/26/2021 12:00:00 AM, Coverage Ends on: 09/22/2021 12:00:00 AM

## 2021-03-29 ENCOUNTER — Ambulatory Visit: Payer: Medicare PPO | Admitting: Adult Health

## 2021-03-29 ENCOUNTER — Other Ambulatory Visit: Payer: Self-pay

## 2021-03-29 ENCOUNTER — Encounter: Payer: Self-pay | Admitting: Adult Health

## 2021-03-29 VITALS — BP 116/78 | HR 80 | Temp 96.8°F | Wt 173.0 lb

## 2021-03-29 DIAGNOSIS — M5431 Sciatica, right side: Secondary | ICD-10-CM | POA: Diagnosis not present

## 2021-03-29 MED ORDER — PREDNISONE 20 MG PO TABS: ORAL_TABLET | ORAL | 0 refills | Status: AC

## 2021-03-29 MED ORDER — REPATHA SURECLICK 140 MG/ML ~~LOC~~ SOAJ: 1.0000 | SUBCUTANEOUS | 11 refills | Status: DC

## 2021-03-29 NOTE — Addendum Note (Signed)
Addended by: Fidel Levy on: 03/29/2021 02:24 PM   Modules accepted: Orders

## 2021-03-29 NOTE — Telephone Encounter (Signed)
Patient aware med approved. Rx(s) sent to pharmacy electronically. She would like to come by office for demo - she will call to arrange once she has medication on hand

## 2021-03-29 NOTE — Patient Instructions (Signed)
Sciatica Rehab °Ask your health care provider which exercises are safe for you. Do exercises exactly as told by your health care provider and adjust them as directed. It is normal to feel mild stretching, pulling, tightness, or discomfort as you do these exercises. Stop right away if you feel sudden pain or your pain gets worse. Do not begin these exercises until told by your health care provider. °Stretching and range-of-motion exercises °These exercises warm up your muscles and joints and improve the movement and flexibility of your hips and back. These exercises also help to relieve pain, numbness, and tingling. °Sciatic nerve glide °Sit in a chair with your head facing down toward your chest. Place your hands behind your back. Let your shoulders slump forward. °Slowly straighten one of your legs while you tilt your head back as if you are looking toward the ceiling. Only straighten your leg as far as you can without making your symptoms worse. °Hold this position for __________ seconds. °Slowly return to the starting position. °Repeat with your other leg. °Repeat __________ times. Complete this exercise __________ times a day. °Knee to chest with hip adduction and internal rotation ° °Lie on your back on a firm surface with both legs straight. °Bend one of your knees and move it up toward your chest until you feel a gentle stretch in your lower back and buttock. Then, move your knee toward the shoulder that is on the opposite side from your leg. This is hip adduction and internal rotation. °Hold your leg in this position by holding on to the front of your knee. °Hold this position for __________ seconds. °Slowly return to the starting position. °Repeat with your other leg. °Repeat __________ times. Complete this exercise __________ times a day. °Prone extension on elbows ° °Lie on your abdomen on a firm surface. A bed may be too soft for this exercise. °Prop yourself up on your elbows. °Use your arms to help  lift your chest up until you feel a gentle stretch in your abdomen and your lower back. °This will place some of your body weight on your elbows. If this is uncomfortable, try stacking pillows under your chest. °Your hips should stay down, against the surface that you are lying on. Keep your hip and back muscles relaxed. °Hold this position for __________ seconds. °Slowly relax your upper body and return to the starting position. °Repeat __________ times. Complete this exercise __________ times a day. °Strengthening exercises °These exercises build strength and endurance in your back. Endurance is the ability to use your muscles for a long time, even after they get tired. °Pelvic tilt °This exercise strengthens the muscles that lie deep in the abdomen. °Lie on your back on a firm surface. Bend your knees and keep your feet flat on the floor. °Tense your abdominal muscles. Tip your pelvis up toward the ceiling and flatten your lower back into the floor. °To help with this exercise, you may place a small towel under your lower back and try to push your back into the towel. °Hold this position for __________ seconds. °Let your muscles relax completely before you repeat this exercise. °Repeat __________ times. Complete this exercise __________ times a day. °Alternating arm and leg raises ° °Get on your hands and knees on a firm surface. If you are on a hard floor, you may want to use padding, such as an exercise mat, to cushion your knees. °Line up your arms and legs. Your hands should be directly below your shoulders,   and your knees should be directly below your hips. °Lift your left leg behind you. At the same time, raise your right arm and straighten it in front of you. °Do not lift your leg higher than your hip. °Do not lift your arm higher than your shoulder. °Keep your abdominal and back muscles tight. °Keep your hips facing the ground. °Do not arch your back. °Keep your balance carefully, and do not hold your  breath. °Hold this position for __________ seconds. °Slowly return to the starting position. °Repeat with your right leg and your left arm. °Repeat __________ times. Complete this exercise __________ times a day. °Posture and body mechanics °Good posture and healthy body mechanics can help to relieve stress in your body's tissues and joints. Body mechanics refers to the movements and positions of your body while you do your daily activities. Posture is part of body mechanics. Good posture means: °Your spine is in its natural S-curve position (neutral). °Your shoulders are pulled back slightly. °Your head is not tipped forward. °Follow these guidelines to improve your posture and body mechanics in your everyday activities. °Standing ° °When standing, keep your spine neutral and your feet about hip width apart. Keep a slight bend in your knees. Your ears, shoulders, and hips should line up. °When you do a task in which you stand in one place for a long time, place one foot up on a stable object that is 2-4 inches (5-10 cm) high, such as a footstool. This helps keep your spine neutral. °Sitting ° °When sitting, keep your spine neutral and keep your feet flat on the floor. Use a footrest, if necessary, and keep your thighs parallel to the floor. Avoid rounding your shoulders, and avoid tilting your head forward. °When working at a desk or a computer, keep your desk at a height where your hands are slightly lower than your elbows. Slide your chair under your desk so you are close enough to maintain good posture. °When working at a computer, place your monitor at a height where you are looking straight ahead and you do not have to tilt your head forward or downward to look at the screen. °Resting °When lying down and resting, avoid positions that are most painful for you. °If you have pain with activities such as sitting, bending, stooping, or squatting, lie in a position in which your body does not bend very much. For  example, avoid curling up on your side with your arms and knees near your chest (fetal position). °If you have pain with activities such as standing for a long time or reaching with your arms, lie with your spine in a neutral position and bend your knees slightly. Try the following positions: °Lying on your side with a pillow between your knees. °Lying on your back with a pillow under your knees. °Lifting ° °When lifting objects, keep your feet at least shoulder width apart and tighten your abdominal muscles. °Bend your knees and hips and keep your spine neutral. It is important to lift using the strength of your legs, not your back. Do not lock your knees straight out. °Always ask for help to lift heavy or awkward objects. °This information is not intended to replace advice given to you by your health care provider. Make sure you discuss any questions you have with your health care provider. °Document Revised: 01/08/2019 Document Reviewed: 10/08/2018 °Elsevier Patient Education © 2022 [employer] ° °

## 2021-03-29 NOTE — Progress Notes (Signed)
Assessment and Plan:  Laura Maynard was seen today for leg pain.  Diagnoses and all orders for this visit:  Right sided sciatica - negative straight leg Prednisone was prescribed due to reported benefit at home Exercises/stretches demonstrated and given in handout If not better follow up in office or will refer to PT/orthopedics. -     predniSONE (DELTASONE) 20 MG tablet; 3 tablets daily with food for 3 days, 2 tabs daily for 3 days, 1 tab a day for 5 days.   Further disposition pending results of labs. Discussed med's effects and SE's.   Over 15 minutes of exam, counseling, chart review, and critical decision making was performed.   Future Appointments  Date Time Provider [place]  04/26/2021  9:00 AM Liane Comber, NP [facility] None  05/18/2021  9:00 AM Philemon Kingdom, MD [facility] None  07/31/2021  9:00 AM Liane Comber, NP [facility] None  01/23/2022  9:00 AM [name], MD [facility] None    ------------------------------------------------------------------------------------------------------------------   HPI BP 116/78   Pulse 80   Temp (!) 96.8 F (36 C)   Wt 173 lb (78.5 kg)   SpO2 98%   BMI 27.92 kg/m  75 y.o.female presents for evaluation of radiating leg pain.   She reports 1 week ago started experiencing radiating pain of the R that started in her buttock and radiates down posterior leg/calf to ankle. Denies numbness tingling weakness. Denies lumbar back pain though does report remote hx of back surgery that has not given her any trouble. She reports pain mainly with position changes. Denies at night.Took some old prednisone and reports this has improved sx significantly.   She reports has arthritic R knee, follows with Dr. Wynelle Link.   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   acetic acid-hydrocortisone (VOSOL-HC) OTIC solution Instill  4 to 5 drops  to affected ear 3 to 3 x /day   butalbital-acetaminophen-caffeine (FIORICET) 50-325-40 MG tablet Take  1 tablet  every 4 hours  if needed for severe Headache   cholecalciferol (VITAMIN D3) 25 MCG (1000 UT) tablet Take 1,000 Units by mouth daily.   fluticasone (FLONASE) 50 MCG/ACT nasal spray INSTILL 1-2 SPRAYS INTO BOTH NOSTRILS TWICE DAILY AS NEEDED FOR ALLERGIES   levothyroxine (SYNTHROID) 50 MCG tablet TAKE 1 AND 1/2 TABLETS BY MOUTH DAILY   meclizine (ANTIVERT) 25 MG tablet Take      1 tablet      3 x day       if needed for Dizziness /Vertigo   metroNIDAZOLE (METROGEL) 1 % gel Apply topically daily. Apply  Daily  to Acne Rosacea rash   naproxen sodium (ANAPROX) 220 MG  tablet Take 440 mg by mouth 2 (two) times daily as needed (pain).   Bempedoic Acid (NEXLETOL) 180 MG TABS Take 1 tablet by mouth daily. (Patient not taking: Reported on 03/29/2021)   cetirizine-pseudoephedrine (ZYRTEC-D) 5-120 MG tablet Take 1 tablet by mouth 2 (two) times daily. (Patient not taking: Reported on 03/29/2021)   Rimegepant Sulfate (NURTEC) 75 MG TBDP Take 1 tab as needed with onset of migraine symptoms, up to 15 times in a month.   No current facility-administered medications on file prior to visit.    ROS: all negative except above.   Physical Exam:  BP 116/78   Pulse 80   Temp (!) 96.8 F (36 C)   Wt 173 lb (78.5 kg)   SpO2 98%    BMI 27.92 kg/m   General Appearance: Well nourished, in no apparent distress. Eyes: PERRLA, conjunctiva no swelling or erythema ENT/Mouth: Mask in place. Hearing normal.  Neck: Supple, Respiratory: Respiratory effort normal Cardio: Appears well perfused. Brisk peripheral pulses without edema.  Abdomen: Soft, + BS.  Non tender Lymphatics: Non tender without lymphadenopathy.  Musculoskeletal: Patient is able to ambulate well. Gait is not  Antalgic. Straight leg raising with dorsiflexion negative bilaterally for radicular symptoms. Sensory exam in the legs are normal. Knee reflexes are normal Ankle reflexes are normal Strength is normal and symmetric in arms and legs. There is SI tenderness to palpation.  There is not paraspinal muscle spasm.  There is not midline tenderness.  ROM of spine with out pain in all spheres.  Skin: Warm, dry without rashes, lesions, ecchymosis.  Neuro: Normal muscle tone. Sensation intact.  Psych: Awake and oriented X 3, normal affect, Insight and Judgment appropriate.     Izora Ribas, NP 9:45 AM [facility]

## 2021-04-06 DIAGNOSIS — M170 Bilateral primary osteoarthritis of knee: Secondary | ICD-10-CM | POA: Diagnosis not present

## 2021-04-06 DIAGNOSIS — M25552 Pain in left hip: Secondary | ICD-10-CM | POA: Diagnosis not present

## 2021-04-10 ENCOUNTER — Other Ambulatory Visit: Payer: Self-pay | Admitting: Internal Medicine

## 2021-04-10 ENCOUNTER — Telehealth: Payer: Self-pay

## 2021-04-10 MED ORDER — PSEUDOEPHEDRINE HCL ER 120 MG PO TB12: ORAL_TABLET | ORAL | 2 refills | Status: DC

## 2021-04-10 MED ORDER — DEXAMETHASONE 2 MG PO TABS: ORAL_TABLET | ORAL | 0 refills | Status: DC

## 2021-04-10 MED ORDER — AZITHROMYCIN 250 MG PO TABS: ORAL_TABLET | ORAL | 0 refills | Status: DC

## 2021-04-10 NOTE — Telephone Encounter (Signed)
   [facility] Pre-operative Risk Assessment    Patient Name: Laura Maynard  DOB: November 12, 1945 MRN: [id number]  HEARTCARE STAFF:  - IMPORTANT!!!!!! Under Visit Info/Reason for Call, type in Other and utilize the format Clearance MM/DD/YY or Clearance TBD. Do not use dashes or single digits. - Please review there is not already an duplicate clearance open for this procedure. - If request is for dental extraction, please clarify the # of teeth to be extracted. - If the patient is currently at the dentist's office, call Pre-Op Callback Staff (MA/nurse) to input urgent request.  - If the patient is not currently in the dentist office, please route to the Pre-Op pool.  Request for surgical clearance:  What type of surgery is being performed? Right Total Knee Arthroplasty  When is this surgery scheduled? TBD  What type of clearance is required (medical clearance vs. Pharmacy clearance to hold med vs. Both)? BOTH  Are there any medications that need to be held prior to surgery and how long? N/A  Practice name and name of physician performing surgery? [facility] Dr. Larey Dresser  What is the office phone number? [phone]   7.   What is the office fax number?  [place]- [phone]  8.   Anesthesia type (None, local, MAC, general) ? Choice   Drucella Karbowski B Zoiey Christy 04/10/2021, 2:33 PM  _________________________________________________________________   (provider comments below)

## 2021-04-10 NOTE — Telephone Encounter (Signed)
   Name: Laura Maynard  DOB: 1946/04/30  MRN: [id number]   Primary Cardiologist: Pixie Casino, MD  Chart reviewed as part of pre-operative protocol coverage. Patient was contacted 04/10/2021 in reference to pre-operative risk assessment for pending surgery as outlined below.  Laura Maynard was last seen on 12/07/20 by Dr. Debara Pickett.  Since that day, Laura Maynard has done well. She does not have a history of CAD, PCI, or stroke. She can complete more than 4.0 METS without angina.  Therefore, based on ACC/AHA guidelines, the patient would be at acceptable risk for the planned procedure without further cardiovascular testing.   The patient was advised that if she develops new symptoms prior to surgery to contact our office to arrange for a follow-up visit, and she verbalized understanding.  I will route this recommendation to the requesting party via Epic fax function and remove from pre-op pool. Please call with questions.  Ledora Bottcher, PA 04/10/2021, 4:23 PM

## 2021-04-25 NOTE — Progress Notes (Deleted)
CPE   Assessment:    Encounter for Annual Physical Exam with abnormal findings Due annually  Health Maintenance reviewed Healthy lifestyle reviewed and goals set   Essential hypertension - continue medications, DASH diet, exercise and monitor at home. Call if greater than 130/80.   History of prediabetes Recent A1Cs at goal Discussed diet/exercise, weight management  Check A1C annually  Postablative hypothyroidism  follows with Dr. Darnell Level, continue med the same at this time, declines TSH today, has upcoming appointment ***  Hyperlipidemia -declines medications  check lipids, decrease fatty foods, increase activity.  -law lipid clinic, wasn't recommended PCSK9i, was recommended low risk due to high HDL -zetia intolerance, not candidate for nexlitol due to lack of vascular dx; no aortic atherosclerosis per coronary calcium CT  TMJ (temporomandibular joint syndrome) Monitor  Benign paroxysmal positional vertigo due to bilateral vestibular disorder Monitor, takes meclizine PRN, none recent   Multinodular goiter  follows with Dr. Darnell Level, continue med the same at this time  Vitamin D deficiency -     VITAMIN D 25 Hydroxy (Vit-D Deficiency, Fractures)  Poor compliance with medication Emphasized compliance with medications and appointments, patient expressed understanding  Other obsessive-compulsive disorders Patient reports improved symptoms  Monitor with routine screening  Medication management -     Magnesium  BMI 26.0-26.9,adult - long discussion about weight loss, diet, and exercise -recommended diet heavy in fruits and veggies and low in animal meats, cheeses, and dairy products  Migraines Fioricent works but with nausea/emesis; discussed nurtec, samples given  Follow up as needed   No orders of the defined types were placed in this encounter.    Over 30 minutes of exam, counseling, chart review, and critical decision making was performed  Future Appointments   Date Time Provider [place]  04/26/2021  9:00 AM Liane Comber, NP [facility] None  05/18/2021  9:00 AM Philemon Kingdom, MD [facility] None  07/10/2021  9:00 AM [facility] PAT 2 [facility] None  07/31/2021  9:00 AM Liane Comber, NP [facility] None  01/23/2022  9:00 AM [name], MD [facility] None    Plan:   During the course of the visit the patient was educated and counseled about appropriate screening and preventive services including:   Pneumococcal vaccine  Influenza vaccine Td vaccine Prevnar 13 Screening electrocardiogram Screening mammography Bone densitometry screening Colorectal cancer screening Diabetes screening Glaucoma screening Nutrition counseling  Advanced directives: given info/requested copies   Subjective:   Laura Maynard is a 75 y.o. female who presents for CPE and 3 month follow up on hypertension, glucose management, hyperlipidemia, vitamin D def. SHe has Labile hypertension; History of prediabetes; Vitamin D deficiency; Hyperlipidemia, mixed; Poor compliance with medication; Benign paroxysmal positional vertigo; TMJ (temporomandibular joint syndrome); Postablative hypothyroidism; BMI 26.0-26.9,adult; Medication Phobia; Multinodular goiter; FH: hypertension; Abnormal glucose; Statin myopathy; Migraine without aura and without status migrainosus, not intractable; Insomnia; and Right sided sciatica on their problem list.   She is getting knee injections by Dr. Wynelle Link for bilateral knee arthritis and doing fairly.   She has long history of migraines, typically wake up with it, usually once every two weeks or so, very nauseous, will take 1/2 tab fioricet but will throw up and repeats. Has been prescribed zofran but hasn't tried yet.   BMI is There is no height or weight on file to calculate BMI., she has not been working on diet, has been doing 10 min of stationary cycle daily  Wt Readings from Last 3 Encounters:  03/29/21 173 lb (78.5  kg)   01/23/21 173 lb 12.8 oz (78.8 kg)  12/07/20 176 lb (79.8 kg)   Her blood pressure has been controlled at home, today their BP is    She does not workout. She denies chest pain, shortness of breath, dizziness.    She is on cholesterol medication (prescribed nexlitol, hx of myalgia with pravastatin and zetia, has adamantly declined all other medications, has seen lipid clinic, coronary calcium score of 43, 64th percentile, NO atherosclerosis, was not recommended PCSK9) and denies myalgias. Her cholesterol is not at goal. The cholesterol last visit was:   Lab Results  Component Value Date   CHOL 247 (H) 12/01/2020   HDL 118 12/01/2020   LDLCALC 121 (H) 12/01/2020   TRIG 50 12/01/2020   CHOLHDL 2.1 12/01/2020    She has hx of prediabetes (A1C 5.8% in 2015, 5.7% in 2016) recently well controlled  Lab Results  Component Value Date   HGBA1C 5.3 01/23/2021   Patient is taking 1000-2000 IU vitamin D, cannot tolerate higher dose Lab Results  Component Value Date   VD25OH 26.0 (L) 11/17/2020    hx/o Toxic MNG and was treated by Dr Cruzita Lederer in Sept 2015 with RAI-131*  She is on thyroid medication, follows with Dr. Cruzita Lederer. Her medication was not changed last visit.   Has upcoming visit in August, declines TSH today  Lab Results  Component Value Date   TSH 2.67 11/17/2020       Medication Review  Current Outpatient Medications (Endocrine & Metabolic):    dexamethasone (DECADRON) 2 MG tablet, Take 1 tab 3 x /day for 2 days,      then 2 x /day for 2  Days,     then 1 tab daily   levothyroxine (SYNTHROID) 50 MCG tablet, TAKE 1 AND 1/2 TABLETS BY MOUTH DAILY  Current Outpatient Medications (Cardiovascular):    Evolocumab (REPATHA SURECLICK) XX123456 MG/ML SOAJ, Inject 1 Dose into the skin every 14 (fourteen) days.  Current Outpatient Medications (Respiratory):    pseudoephedrine (SUDAFED) 120 MG 12 hr tablet, Take   1 tablet    2 x /day (every 12 hours)    for Sinus & Chest Congestion    fluticasone (FLONASE) 50 MCG/ACT nasal spray, INSTILL 1-2 SPRAYS INTO BOTH NOSTRILS TWICE DAILY AS NEEDED FOR ALLERGIES  Current Outpatient Medications (Analgesics):    Acetaminophen 325 MG CAPS, as needed.   butalbital-acetaminophen-caffeine (FIORICET) 50-325-40 MG tablet, Take  1 tablet  every 4 hours  if needed for severe Headache   naproxen sodium (ANAPROX) 220 MG tablet, Take 440 mg by mouth 2 (two) times daily as needed (pain).   Current Outpatient Medications (Other):    azithromycin (ZITHROMAX) 250 MG tablet, Take 2 tablets with Food on  Day 1, then 1 tablet Daily with Food for Sinusitis / Bronchitis   acetic acid-hydrocortisone (VOSOL-HC) OTIC solution, Instill  4 to 5 drops  to affected ear 3 to 3 x /day   cholecalciferol (VITAMIN D3) 25 MCG (1000 UT) tablet, Take 1,000 Units by mouth daily.   meclizine (ANTIVERT) 25 MG tablet, Take      1 tablet      3 x day       if needed for Dizziness /Vertigo   metroNIDAZOLE (METROGEL) 1 % gel, Apply topically daily. Apply  Daily  to Acne Rosacea rash  Allergies: Allergies  Allergen Reactions   Doxycycline Itching    itching   Gabapentin Nausea And Vomiting   Penicillins Hives   Pravastatin  Myalgia   Rosuvastatin Other (See Comments)    Joint/muscle aches, fatigue   Sulfa Antibiotics Hives   Zetia [Ezetimibe]     Myalgia    Current Problems (verified) has Labile hypertension; History of prediabetes; Vitamin D deficiency; Hyperlipidemia, mixed; Poor compliance with medication; Benign paroxysmal positional vertigo; TMJ (temporomandibular joint syndrome); Postablative hypothyroidism; BMI 26.0-26.9,adult; Medication Phobia; Multinodular goiter; FH: hypertension; Abnormal glucose; Statin myopathy; Migraine without aura and without status migrainosus, not intractable; Insomnia; and Right sided sciatica on their problem list.  Screening Tests Immunization History  Administered Date(s) Administered   Influenza Split 06/30/2013    Influenza, High Dose Seasonal PF 06/26/2015, 07/25/2016, 05/26/2017, 06/30/2018, 06/10/2019, 06/26/2020   PFIZER(Purple Top)SARS-COV-2 Vaccination 11/26/2019, 12/25/2019, 06/30/2020   Pneumococcal Conjugate-13 09/14/2014   Pneumococcal Polysaccharide-23 10/16/2011, 02/04/2017   Td 10/16/2011   Zoster, Live 01/14/2007   *** Preventative care: Last colonoscopy: 2012 cologuard 03/2020 Last mammogram: 06/2020 DEXA: declines, may consider next year ***  Prior vaccinations: TD or Tdap: 2013  Influenza: 06/2019  Pneumococcal:  2013 Prevnar13:  2015 Shingles/Zostavax: 2008 Covid 19: 2/2, 2021, pfizer  Names of Other Physician/Practitioners you currently use: 1. [facility]- here for primary care 2. [facility] in [place], eye doctor, last visit 2021 3. Dr. Clifton James, dentist, last visit 2021, q74m Patient Care Team: MUnk Pinto MD as PCP - General (Internal Medicine) HDebara PickettKNadean Corwin MD as PCP - Cardiology (Cardiology) WVanessa Kick MD (Dermatology) GPhilemon Kingdom MD as Consulting Physician (Internal Medicine) YMarcial Pacas MD as Consulting Physician (Neurology) GGatha Mayer MD as Consulting Physician (Gastroenterology) NRozetta Nunnery MD as Consulting Physician (Otolaryngology) AGaynelle Arabian MD as Consulting Physician (Orthopedic Surgery) Dr. HUlanda Edison Surgical: She  has a past surgical history that includes Eye surgery (1964); Back surgery (1970); Nasal septum surgery (1998); Total abdominal hysterectomy (2000); Knee arthroscopy (2002); Colonoscopy (2006; 03/28/11); and Cataract extraction (Left, 12/2017). Family Her family history includes COPD in her sister; Diabetes in her brother; Emphysema in her father; Lung cancer (age of onset: 669 in her sister; Stroke in her mother; Throat cancer (age of onset: 634 in her brother. Social history  She reports that she has never smoked. She has never used smokeless tobacco. She reports  that she does not drink alcohol and does not use drugs.  *** Review of Systems  Constitutional:  Negative for malaise/fatigue and weight loss.  HENT:  Negative for hearing loss and tinnitus.   Eyes:  Negative for blurred vision and double vision.  Respiratory:  Negative for cough, sputum production, shortness of breath and wheezing.   Cardiovascular:  Negative for chest pain, palpitations, orthopnea, claudication, leg swelling and PND.  Gastrointestinal:  Negative for abdominal pain, blood in stool, constipation, diarrhea, heartburn, melena, nausea and vomiting.  Genitourinary: Negative.   Musculoskeletal:  Negative for falls, joint pain and myalgias.  Skin:  Negative for rash.  Neurological:  Negative for dizziness, tingling, sensory change, weakness and headaches.  Endo/Heme/Allergies:  Negative for polydipsia.  Psychiatric/Behavioral: Negative.  Negative for depression, memory loss, substance abuse and suicidal ideas. The patient is not nervous/anxious and does not have insomnia.   All other systems reviewed and are negative.   Objective:   There were no vitals filed for this visit.  Wt Readings from Last 3 Encounters:  03/29/21 173 lb (78.5 kg)  01/23/21 173 lb 12.8 oz (78.8 kg)  12/07/20 176 lb (79.8 kg)    There is no height or weight on file to  calculate BMI.  General appearance: alert, no distress, WD/WN,  female HEENT: normocephalic, sclerae anicteric, TMs pearly, nares patent, no discharge or erythema, pharynx normal. R pupil fixed (since teen following eye trauma), L round and reactive  Oral cavity: MMM, no lesions Neck: supple, no lymphadenopathy, no thyromegaly, no masses Heart: RRR, normal S1, S2, no murmurs Lungs: CTA bilaterally, no wheezes, rhonchi, or rales Abdomen: +bs, soft, non tender, non distended, no masses, no hepatomegaly, no splenomegaly Musculoskeletal: nontender C7, + pain with neck flexion, no swelling, no obvious deformity Extremities: no edema,  no cyanosis, no clubbing Pulses: 2+ symmetric, upper and lower extremities, normal cap refill Neurological: alert, oriented x 3, CN2-12 intact except pupils, strength normal upper extremities and lower extremities, sensation normal throughout, DTRs 2+ throughout, no cerebellar signs, gait normal Psychiatric: normal affect, behavior normal, pleasant  Breasts: *** GU: ***  EKG: ***   Izora Ribas, NP   04/25/2021

## 2021-04-26 ENCOUNTER — Ambulatory Visit: Payer: Medicare PPO | Admitting: Adult Health

## 2021-05-01 ENCOUNTER — Other Ambulatory Visit: Payer: Self-pay | Admitting: Internal Medicine

## 2021-05-10 ENCOUNTER — Telehealth: Payer: Self-pay | Admitting: Internal Medicine

## 2021-05-10 NOTE — Telephone Encounter (Signed)
The patient has been made aware and will call back if anything further is needed.

## 2021-05-10 NOTE — Telephone Encounter (Signed)
Probably not the medicine if the 1st and 2nd injections went well - would go ahead with the next injection on 8/22. Could have PCP evaluate symptoms if they do not resolve.  Dr Lemmie Evens

## 2021-05-10 NOTE — Telephone Encounter (Signed)
Spoke with the patient. She stated that she did her third injection of Repatha on 8/8. Since then she has had bad itching on her scalp, stomach, neck and backside. She denies shortness of breath. Benadryl has helped relieve her symptoms somewhat  She has not had any dietary changes or changes in soaps.  Her next injection is due 8/22/2

## 2021-05-10 NOTE — Telephone Encounter (Signed)
Pt c/o medication issue:  1. Name of Medication:  Evolocumab (REPATHA SURECLICK) XX123456 MG/ML SOAJ  2. How are you currently taking this medication (dosage and times per day)?  As prescribed  3. Are you having a reaction (difficulty breathing--STAT)?  See below  4. What is your medication issue?   Patient states she has taken 3 Repatha injections--last injection was on 05/07/21. She states ever since she took it she's been itching all over--no rash. She also mentioned she uses Aveeno soap and recently started using Restasis for dry eyes.

## 2021-05-16 DIAGNOSIS — H04123 Dry eye syndrome of bilateral lacrimal glands: Secondary | ICD-10-CM | POA: Diagnosis not present

## 2021-05-18 ENCOUNTER — Ambulatory Visit: Payer: Medicare PPO | Admitting: Internal Medicine

## 2021-05-18 ENCOUNTER — Other Ambulatory Visit: Payer: Self-pay

## 2021-05-18 ENCOUNTER — Encounter: Payer: Self-pay | Admitting: Internal Medicine

## 2021-05-18 VITALS — BP 128/64 | HR 96 | Ht 66.0 in | Wt 175.0 lb

## 2021-05-18 DIAGNOSIS — E559 Vitamin D deficiency, unspecified: Secondary | ICD-10-CM

## 2021-05-18 DIAGNOSIS — E042 Nontoxic multinodular goiter: Secondary | ICD-10-CM | POA: Diagnosis not present

## 2021-05-18 DIAGNOSIS — E890 Postprocedural hypothyroidism: Secondary | ICD-10-CM

## 2021-05-18 LAB — T4, FREE: Free T4: 0.89 ng/dL (ref 0.60–1.60)

## 2021-05-18 LAB — TSH: TSH: 5.02 u[IU]/mL (ref 0.35–5.50)

## 2021-05-18 MED ORDER — LEVOTHYROXINE SODIUM 50 MCG PO TABS: 75.0000 ug | ORAL_TABLET | Freq: Every day | ORAL | 3 refills | Status: DC

## 2021-05-18 NOTE — Progress Notes (Signed)
Patient ID: Laura Maynard, female   DOB: 1946/03/31, 75 y.o.   MRN: [id number]  This visit occurred during the SARS-CoV-2 public health emergency.  Safety protocols were in place, including screening questions prior to the visit, additional usage of staff PPE, and extensive cleaning of exam room while observing appropriate contact time as indicated for disinfecting solutions.   HPI  Laura Maynard is a 75 y.o.-year-old female, returning for f/u for postablative hypothyroidism after toxic multinodular goiter (TMNG) radioactive iodine treatment. Last visit 6 months ago.  Interim history: She started Repatha since last OV (2 mo ago). She has dry skin. She also continues to have significant knee pain. Otherwise, no complaints at today's visit.    Reviewed history: Pt had screening labs in 01/2014 and was found to have a low TSH. This was repeated 2x and the TSH continued to decrease.   Thyroid Uptake and scan (05/20/2014) >> uptake 48.5% and scan c/w TMNG.  We started MMI 5 mg bid.  Had RAI tx on 06/09/2014.   After RAI treatment, she developed post-ablative hypothyroidism >> we started levothyroxine 50 g daily. She developed leg cramps, which resolved after stopping levothyroxine but her TSH started to increase, so we change to Tirosint. Unfortunately, she could not afford this and we had to restart levothyroxine at the lower dose, subsequent to be increased back to 50 g daily (on 08/12/2016).  Her TSH became suppressed after increased the dose to 75 mcg daily.    She had itching with a 75 mcg tablet so we switched to only 50 mcg tablets, which are white.  She is tolerating these well.  She continues on levothyroxine 50 alternating with 75 mcg every other day: - in am - fasting - at least 30 min from b'fast - no calcium - no iron - no multivitamins - no PPIs - not on Biotin  Reviewed her TFTs: Lab Results  Component Value Date   TSH 2.67 11/17/2020   TSH 4.22 07/20/2020   TSH  3.77 01/19/2020   TSH 3.41 10/26/2019   TSH 3.11 04/22/2019   TSH 2.79 10/29/2018   TSH 4.71 (H) 09/17/2018   TSH 1.93 06/15/2018   TSH 2.69 02/05/2018   TSH 0.75 10/14/2017   FREET4 0.88 11/17/2020   FREET4 0.96 10/26/2019   FREET4 0.89 04/22/2019   FREET4 0.95 10/29/2018   FREET4 0.95 10/14/2017   FREET4 1.14 08/28/2017   FREET4 1.11 06/23/2017   FREET4 0.91 04/28/2017   FREET4 0.87 10/02/2016   FREET4 0.65 08/12/2016    Pt denies: - feeling nodules in neck - hoarseness - dysphagia - choking - SOB with lying down  She also has a history of HTN, HL, anemia.  Vitamin D deficiency:  Reviewed vitamin D levels: Lab Results  Component Value Date   VD25OH 26.0 (L) 11/17/2020   VD25OH 25.8 (L) 05/24/2020   VD25OH 22 (L) 01/19/2020   VD25OH 27.83 (L) 10/26/2019   VD25OH 24.26 (L) 04/22/2019   VD25OH 6 (L) 09/17/2018   VD25OH 12 (L) 02/05/2018   VD25OH 16.80 (L) 06/23/2017   VD25OH 23.88 (L) 04/28/2017   VD25OH 20 (L) 02/04/2017   She had very low vitamin D levels in the past; she was previously on 5000 units vitamin D daily but was not compliant with this due to headaches and itching.  She was on 1000 units daily at last visit (reduced dose due to concerns for constipation), and I advised her to increase to 2000 units daily.  However at last visit she was still on 1000 units daily.  His vitamin D was low I advised her to at least alternate 1000 with 2000 units every other day.  However, she continues on 1000 units daily.She tells me that this is the maximum amount that she can take per day.  She had steroid injections in her knees. Recent Dexamethasone - 1 mo ago.  Dr. Debara Pickett started a statin (Crestor 5) qod but she could not tolerate this due to knee pain. Now also on Rapatha.  ROS: Constitutional: no weight gain/no weight loss, no fatigue, no subjective hyperthermia, no subjective hypothermia Eyes: no blurry vision, no xerophthalmia ENT: no sore throat, + see  HPI Cardiovascular: no CP/no SOB/no palpitations/no leg swelling Respiratory: no cough/no SOB/no wheezing Gastrointestinal: no N/no V/no D/no C/no acid reflux Musculoskeletal: + Muscle aches/+ joint aches - knees - needs surgery Skin: no rashes, no hair loss Neurological: no tremors/no numbness/no tingling/no dizziness, + sciatica pain  I reviewed pt's medications, allergies, PMH, social hx, family hx, and changes were documented in the history of present illness. Otherwise, unchanged from my initial visit note.  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   High cholesterol    Hypertension    Hypothyroidism    Migraine    Prediabetes    Sarcoidosis, lung ([place]) 15 YRS AGO   NO TREATMENTS   Toxic multinodular goiter 05/27/2014   Vertigo    Vitamin D deficiency    Past Surgical History:  Procedure Laterality Date   BACK SURGERY  1970   CATARACT EXTRACTION Left 12/2017   COLONOSCOPY  2006; 03/28/11   2 small adenomas; normal   EYE SURGERY  1964   right eye   KNEE ARTHROSCOPY  2002   NASAL SEPTUM SURGERY  1998   TOTAL ABDOMINAL HYSTERECTOMY  2000   Social History   Socioeconomic History   Marital status: Divorced    Spouse name: Not on file   Number of children: 0   Years of education: college   Highest education level: Not on file  Occupational History    Comment: Retired  Tobacco Use   Smoking status: Never   Smokeless tobacco: Never  Vaping Use   Vaping Use: Never used  Substance and Sexual Activity   Alcohol use: Never    Alcohol/week: 1.0 standard drink    Types: 1 Standard drinks or equivalent per week    Comment: RARE   Drug use: Never   Sexual activity: Not Currently  Other Topics Concern   Not on file  Social History Narrative   Patient is retired and lives at home alone. Patient  has college education.   Caffeine- 3 cups of caffeine daily.  One soda daily.   Right handed.   Social  Determinants of Health   Financial Resource Strain: Not on file  Food Insecurity: Not on file  Transportation Needs: Not on file  Physical Activity: Not on file  Stress: Not on file  Social Connections: Not on file  Intimate Partner Violence: Not on file   Current Outpatient Medications on File Prior to Visit  Medication Sig Dispense Refill   Acetaminophen 325 MG CAPS as needed.     acetic acid-hydrocortisone (VOSOL-HC) OTIC solution Instill  4 to 5 drops  to affected ear 3 to 3 x /day 10 mL 99   azithromycin (ZITHROMAX) 250 MG tablet Take 2 tablets with Food  on  Day 1, then 1 tablet Daily with Food for Sinusitis / Bronchitis 6 each 0   butalbital-acetaminophen-caffeine (FIORICET) 50-325-40 MG tablet Take  1 tablet  every 4 hours  if needed for severe Headache 30 tablet 0   cholecalciferol (VITAMIN D3) 25 MCG (1000 UT) tablet Take 1,000 Units by mouth daily.     dexamethasone (DECADRON) 2 MG tablet Take 1 tab 3 x /day for 2 days,      then 2 x /day for 2  Days,     then 1 tab daily 13 tablet 0   Evolocumab (REPATHA SURECLICK) XX123456 MG/ML SOAJ Inject 1 Dose into the skin every 14 (fourteen) days. 2 mL 11   fluticasone (FLONASE) 50 MCG/ACT nasal spray INSTILL 1-2 SPRAYS INTO BOTH NOSTRILS TWICE DAILY AS NEEDED FOR ALLERGIES 48 g 3   levothyroxine (SYNTHROID) 50 MCG tablet TAKE 1 AND 1/2 TABLETS BY MOUTH DAILY 150 tablet 3   meclizine (ANTIVERT) 25 MG tablet Take      1 tablet      3 x day       if needed for Dizziness /Vertigo 100 tablet 0   metroNIDAZOLE (METROGEL) 1 % gel Apply topically daily. Apply  Daily  to Acne Rosacea rash 60 g 3   naproxen sodium (ANAPROX) 220 MG tablet Take 440 mg by mouth 2 (two) times daily as needed (pain).     pseudoephedrine (SUDAFED) 120 MG 12 hr tablet Take   1 tablet    2 x /day (every 12 hours)    for Sinus & Chest Congestion 20 tablet 2   No current facility-administered medications on file prior to visit.   Allergies  Allergen Reactions   Doxycycline  Itching    itching   Gabapentin Nausea And Vomiting   Penicillins Hives   Pravastatin     Myalgia   Rosuvastatin Other (See Comments)    Joint/muscle aches, fatigue   Sulfa Antibiotics Hives   Zetia [Ezetimibe]     Myalgia   Family History  Problem Relation Age of Onset   Lung cancer Sister 51   Throat cancer Brother 80   Stroke Mother    Emphysema Father    COPD Sister    Diabetes Brother    Colon cancer Neg Hx    PE: BP 128/64   Pulse 96   Ht '5\' 6"'$  (1.676 m)   Wt 175 lb (79.4 kg)   SpO2 98%   BMI 28.25 kg/m  Body mass index is 28.25 kg/m. Wt Readings from Last 3 Encounters:  05/18/21 175 lb (79.4 kg)  03/29/21 173 lb (78.5 kg)  01/23/21 173 lb 12.8 oz (78.8 kg)   Constitutional: overweight, in NAD Eyes: PERRLA, EOMI, no exophthalmos ENT: moist mucous membranes, no thyromegaly, no cervical lymphadenopathy Cardiovascular: RRR, No MRG Respiratory: CTA B Gastrointestinal: abdomen soft, NT, ND, BS+ Musculoskeletal: no deformities, strength intact in all 4 Skin: moist, warm, no rashes Neurological: no tremor with outstretched hands, DTR normal in all 4  ASSESSMENT: 1. Hypothyroidism after RAI tx  -In the past, she wanted to just see PCP for the hypothyroidism, but she returns 6 months ago and would like to continue to follow with me  2. MNG  3. vitamin D deficiency  PLAN:  1. And 2.  Patient with history of thyrotoxicosis due to toxic multinodular goiter, resolved after RAI treatment.  She developed post ablative hypothyroidism and we started levothyroxine.  She initially complained of severe cramps with levothyroxine but  she was later found to also have vitamin D deficiency, which is not treated.  We did try Tirosint in the past but this was too expensive.  She developed itching with a 75 mcg levothyroxine tablet so she is now only using 50 mcg tablets, without coloring agents.  She is tolerating these well. - latest thyroid labs reviewed with pt. >> normal: Lab  Results  Component Value Date   TSH 2.67 11/17/2020  - she continues on LT4 75 alternating with 50 mcg every other day - pt feels good on this dose. - we discussed about taking the thyroid hormone every day, with water, >30 minutes before breakfast, separated by >4 hours from acid reflux medications, calcium, iron, multivitamins. Pt. is taking it correctly. - will check thyroid tests today: TSH and fT4 - If labs are abnormal, she will need to return for repeat TFTs in 1.5 months - will see her her in 1 year but for labs in 6 mo  3.  Vitamin D deficiency  -She had a very low vitamin D level in the past, and unfortunately, she could not tolerate high-dose of the vitamin D supplement due to constipation. -She is currently on 1000 units vitamin D daily, and she mentions that this is the maximum amount that she can take.  I advised her to try to alternate 1000 to 2000 units every other day but she could not do this -At last visit, vitamin D was only slightly low, at 26 -I will recommend addition of vitamin K2  -We will repeat the vitamin D level at next blood draw  We will need to order her TSH, free T4, and also vit D for 6 months from now.  Component     Latest Ref Rng & Units 05/18/2021  T4,Free(Direct)     0.60 - 1.60 ng/dL 0.89  TSH     0.35 - 5.50 uIU/mL 5.02  TSH is high normal range.  I will advise him to increase the dose to 75 mcg daily and repeat the labs in 1.5 months.  Philemon Kingdom, MD PhD [facility]

## 2021-05-18 NOTE — Patient Instructions (Addendum)
Please stop at the lab.  Please take vit D 1000 units daily. Consider adding vitamin K2.  Continue Levothyroxine 75 alternating with 50 mcg every other day.  Take the thyroid hormone every day, with water, at least 30 minutes before breakfast, separated by at least 4 hours from: - acid reflux medications - calcium - iron - multivitamins  Please come back for a follow-up appointment in 1 year for a visit but for labs in 6 months.

## 2021-05-21 ENCOUNTER — Telehealth: Payer: Self-pay

## 2021-05-21 NOTE — Telephone Encounter (Addendum)
Called and left a message for pt to call back. ----- Message from Philemon Kingdom, MD sent at 05/18/2021 12:44 PM EDT ----- Can you please call pt.:  TSH is high in the normal range.  Please advise her to increase her levothyroxine dose to 75 mcg daily.  I refilled her prescription.  We need to check labs in 1.5 months after the above change.  Labs are in.

## 2021-05-21 NOTE — Telephone Encounter (Signed)
Can we get her scheduled for a follow up lab appt. Thank you.

## 2021-05-21 NOTE — Telephone Encounter (Signed)
Pt returned call and was advised of results. Pt did not want to schedule follow up lab appt until she spoke with Dr Cruzita Lederer regarding results and what exactly her results could mean. Pt was advised her labs were still within range but on the higher end of normal and requested Dr Cruzita Lederer call her and explain why her regimen was changing and what it could mean.

## 2021-05-21 NOTE — Telephone Encounter (Signed)
Called and clarified the recommendation. She agrees to start the higher dose.

## 2021-05-29 DIAGNOSIS — E782 Mixed hyperlipidemia: Secondary | ICD-10-CM | POA: Diagnosis not present

## 2021-05-29 LAB — LIPID PANEL
Chol/HDL Ratio: 2.2 ratio (ref 0.0–4.4)
Cholesterol, Total: 240 mg/dL — ABNORMAL HIGH (ref 100–199)
HDL: 111 mg/dL (ref >39)
LDL Chol Calc (NIH): 121 mg/dL — ABNORMAL HIGH (ref 0–99)
Triglycerides: 50 mg/dL (ref 0–149)
VLDL Cholesterol Cal: 8 mg/dL (ref 5–40)

## 2021-06-05 ENCOUNTER — Telehealth: Payer: Self-pay | Admitting: Internal Medicine

## 2021-06-05 NOTE — Telephone Encounter (Signed)
Spoke with patient of Dr. Debara Pickett regarding recent labs - LDL unchanged over 6 months on Repatha and is still 121. Patient reports she has been taking medication.   She has been scheduled for visit on 06/12/21 with MD to review  She was due for a shot yesterday, would like to know if she even needs to pick up Rx again at this point since she had no response to therapy

## 2021-06-05 NOTE — Telephone Encounter (Signed)
LM TO CALL BACK ./cy

## 2021-06-05 NOTE — Telephone Encounter (Signed)
Laura Maynard is returning Christine's call. Requesting detailed VM if she is unable to answer with the results as well as if she needs to go pick up the new prescription at the pharmacy due to them.

## 2021-06-05 NOTE — Telephone Encounter (Signed)
I would advise she fill the Rx and bring the medicine in with her to the appt so that we can watch her administer it - I have never seen a patient on Repatha with no change in cholesterol.  Dr. Lemmie Evens

## 2021-06-05 NOTE — Telephone Encounter (Signed)
New message:    Patient calling to results from test last week

## 2021-06-05 NOTE — Telephone Encounter (Signed)
Patient aware of MD advice. She will bring Rx to visit next week

## 2021-06-06 ENCOUNTER — Other Ambulatory Visit: Payer: Self-pay | Admitting: Internal Medicine

## 2021-06-06 DIAGNOSIS — G44219 Episodic tension-type headache, not intractable: Secondary | ICD-10-CM

## 2021-06-12 ENCOUNTER — Telehealth: Payer: Self-pay | Admitting: Internal Medicine

## 2021-06-12 ENCOUNTER — Ambulatory Visit: Payer: Medicare PPO | Admitting: Internal Medicine

## 2021-06-12 ENCOUNTER — Other Ambulatory Visit: Payer: Self-pay

## 2021-06-12 ENCOUNTER — Encounter: Payer: Self-pay | Admitting: Internal Medicine

## 2021-06-12 VITALS — BP 121/73 | HR 88 | Ht 66.0 in | Wt 175.8 lb

## 2021-06-12 DIAGNOSIS — T466X5D Adverse effect of antihyperlipidemic and antiarteriosclerotic drugs, subsequent encounter: Secondary | ICD-10-CM | POA: Diagnosis not present

## 2021-06-12 DIAGNOSIS — R931 Abnormal findings on diagnostic imaging of heart and coronary circulation: Secondary | ICD-10-CM | POA: Diagnosis not present

## 2021-06-12 DIAGNOSIS — M791 Myalgia, unspecified site: Secondary | ICD-10-CM

## 2021-06-12 DIAGNOSIS — E785 Hyperlipidemia, unspecified: Secondary | ICD-10-CM

## 2021-06-12 MED ORDER — ROSUVASTATIN CALCIUM 5 MG PO TABS: ORAL_TABLET | ORAL | 3 refills | Status: DC

## 2021-06-12 NOTE — Addendum Note (Signed)
Addended by: Kathyrn Lass on: 06/12/2021 10:11 AM   Modules accepted: Orders

## 2021-06-12 NOTE — Progress Notes (Signed)
LIPID CLINIC CONSULT NOTE  Chief Complaint:  Follow-up dyslipidemia  Primary Care Physician: Laura Pinto, MD   Primary Cardiologist:  Laura Casino, MD  HPI:  Laura Maynard is a 75 y.o. female who is being seen today for the evaluation of dyslipidemia at the request of Laura Pinto, MD. This is a pleasant 75 year old female who is kindly referred by Dr. Melford Maynard for evaluation of dyslipidemia.  According to her she has had a longstanding history of dyslipidemia and is been on multiple therapies including the statins (pravastatin, atorvastatin (and ezetimibe, for which she has had significant side effects including myalgias.  Recently she had a lipid profile which showed total cholesterol 269, triglycerides 57, HDL 99 and LDL 155.  I reviewed her chart and noted that she has no history of coronary disease and few cardiovascular risk factors other than age.  She has no history of hypertension, diabetes and no significant premature coronary disease in her family.  She does have hypothyroidism on low-dose levothyroxine.  Chart review indicated she had a CT angios in 2014 which showed no evidence of pulmonary embolism or pulmonary sarcoidosis.  Also there was no mention of coronary calcification.  08/31/2020  Ms. Laura Maynard is seen today in follow-up.  Repeat lipids show persistent elevation with total cholesterol 269, triglycerides 102, HDL 53 and LDL 152.  She has not been on therapy for this.  A CT calcium score did show some coronary calcium with a score 26, 64th percentile for age and sex matched control.  Small area of calcification noted in the distal circumflex artery.  On these findings I would recommend therapy.  I had recommended trying bempedoic acid however the cost would have been $68 a month.  This is felt to be too expensive for her.  We then talked about several other options.  One might be a very low-dose of a statin such as rosuvastatin which is very potent.  She seemed  agreeable to this.  12/07/2020  Mrs. Laura Maynard is seen today in follow-up.  She has been on low-dose Crestor 5 mg every other day however has significant arthralgias/myalgias which are likely related to the rosuvastatin.  She had similar symptoms with pravastatin and ezetimibe in the past.  I do think we will need to consider an alternative.  In the past we had prescribed bempedoic acid and she was approved for that however the cost was an issue.  I would like to revisit that and she may now be eligible for a grant from [employer].  Her recent cholesterol showed total 247, triglycerides 50, HDL 118 and LDL 121.  06/12/2021  Ms. Laura Maynard is seen today in follow-up.  She reports she has been compliant with the Repatha although her labs 2 weeks ago showed no significant change.  Her total cholesterol still 240 with LDL 121.  This is quite unusual.  She did describe to me how she injected the medication which sounds correct.  There are few explanations as to why she may not have had improvement in this.  One may be that she has a high proportion of LP(a), the other could be a mutation in PCSK9 however that would be unusual given the fact that her cholesterol is really not all that high however does need to be lower based on the fact she has coronary calcium.  We discussed several other options today.  I propose going to atorvastatin however she was deathly afraid of that based on her reading on  the Internet.  She said she would prefer to go back to low-dose rosuvastatin even though it may have caused her side effects.  She is not really prepared to do injections every 2 weeks indefinitely as she thought the medication would only be necessary for short period of time.  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   High cholesterol    Hypertension    Hypothyroidism    Migraine    Prediabetes    Sarcoidosis, lung ([place]) 15 YRS AGO   NO  TREATMENTS   Toxic multinodular goiter 05/27/2014   Vertigo    Vitamin D deficiency     Past Surgical History:  Procedure Laterality Date   BACK SURGERY  1970   CATARACT EXTRACTION Left 12/2017   COLONOSCOPY  2006; 03/28/11   2 small adenomas; normal   EYE SURGERY  1964   right eye   KNEE ARTHROSCOPY  2002   NASAL SEPTUM SURGERY  1998   TOTAL ABDOMINAL HYSTERECTOMY  2000    FAMHx:  Family History  Problem Relation Age of Onset   Lung cancer Sister 10   Throat cancer Brother 84   Stroke Mother    Emphysema Father    COPD Sister    Diabetes Brother    Colon cancer Neg Hx     SOCHx:   reports that she has never smoked. She has never used smokeless tobacco. She reports that she does not drink alcohol and does not use drugs.  ALLERGIES:  Allergies  Allergen Reactions   Doxycycline Itching    itching   Gabapentin Nausea And Vomiting   Penicillins Hives   Pravastatin     Myalgia   Rosuvastatin Other (See Comments)    Joint/muscle aches, fatigue   Sulfa Antibiotics Hives   Zetia [Ezetimibe]     Myalgia    ROS: Pertinent items noted in HPI and remainder of comprehensive ROS otherwise negative.  HOME MEDS: Current Outpatient Medications on File Prior to Visit  Medication Sig Dispense Refill   acetic acid-hydrocortisone (VOSOL-HC) OTIC solution Instill  4 to 5 drops  to affected ear 3 to 3 x /day 10 mL 99   butalbital-acetaminophen-caffeine (FIORICET) 50-325-40 MG tablet TAKE 1 TABLET BY MOUTH EVERY 4 HOURS AS NEEDED FOR SEVERE HEADACHE 30 tablet 0   cetirizine-pseudoephedrine (ZYRTEC-D) 5-120 MG tablet Take 1 tablet by mouth as needed.     cholecalciferol (VITAMIN D3) 25 MCG (1000 UT) tablet Take 1,000 Units by mouth daily.     Evolocumab (REPATHA SURECLICK) [id number] MG/ML SOAJ Inject 1 Dose into the skin every 14 (fourteen) days. 2 mL 11   fluticasone (FLONASE) 50 MCG/ACT nasal spray INSTILL 1-2 SPRAYS INTO BOTH NOSTRILS TWICE DAILY AS NEEDED FOR ALLERGIES 48 g 3    levothyroxine (SYNTHROID) 50 MCG tablet Take 1.5 tablets (75 mcg total) by mouth daily. 135 tablet 3   meclizine (ANTIVERT) 25 MG tablet Take      1 tablet      3 x day       if needed for Dizziness /Vertigo 100 tablet 0   metroNIDAZOLE (METROGEL) 1 % gel Apply topically daily. Apply  Daily  to Acne Rosacea rash 60 g 3   naproxen sodium (ANAPROX) 220 MG tablet Take 440 mg by mouth 2 (two) times daily as needed (pain).     azithromycin (ZITHROMAX) 250 MG tablet Take 2 tablets with Food  on  Day 1, then 1 tablet Daily with Food for Sinusitis / Bronchitis (Patient not taking: Reported on 06/12/2021) 6 each 0   dexamethasone (DECADRON) 2 MG tablet Take 1 tab 3 x /day for 2 days,      then 2 x /day for 2  Days,     then 1 tab daily (Patient not taking: Reported on 06/12/2021) 13 tablet 0   pseudoephedrine (SUDAFED) 120 MG 12 hr tablet Take   1 tablet    2 x /day (every 12 hours)    for Sinus & Chest Congestion (Patient not taking: Reported on 06/12/2021) 20 tablet 2   No current facility-administered medications on file prior to visit.    LABS/IMAGING: No results found for this or any previous visit (from the past 48 hour(s)). No results found.  LIPID PANEL:    Component Value Date/Time   CHOL 240 (H) 05/29/2021 0907   TRIG 50 05/29/2021 0907   HDL 111 05/29/2021 0907   CHOLHDL 2.2 05/29/2021 0907   CHOLHDL 2.6 07/20/2020 1536   VLDL 13 02/04/2017 1550   LDLCALC 121 (H) 05/29/2021 0907   LDLCALC 152 (H) 07/20/2020 1536    WEIGHTS: Wt Readings from Last 3 Encounters:  06/12/21 175 lb 12.8 oz (79.7 kg)  05/18/21 175 lb (79.4 kg)  03/29/21 173 lb (78.5 kg)    VITALS: BP 121/73   Pulse 88   Ht '5\' 6"'$  (1.676 m)   Wt 175 lb 12.8 oz (79.7 kg)   SpO2 99%   BMI 28.37 kg/m   EXAM: Deferred  EKG: Deferred  ASSESSMENT: Mixed dyslipidemia, high LDL and HDL CAC score of 43, 64th percentile (06/2019) Statin intolerance - myalgias  PLAN: 1.   Mrs. Dreisbach does not seem to have any  significant response to Repatha.  This is a very unusual but she claims to be using it every 2 weeks and it seems to be appropriate.  She had been intolerant to statins by her report even low-dose rosuvastatin however she is willing to retry that.  We discussed other statin options however she would prefer to do the rosuvastatin.  We will start 5 mg again every other day.  In the meantime, we should consider Leqvio as an alternative.  This would be every 6 months eventually and may be better tolerated for her.  We will need to see if she would qualify.  Plan repeat lipid in 3 months and follow-up with me in 6 months.  Laura Casino, MD, [facility], [place] Director of the [facility]  [facility] Diplomate of the American Board of Clinical Lipidology Attending Cardiologist  Direct Dial: [phone]  Fax: [phone]  Website:  [email or web address] 06/12/2021, 8:49 AM

## 2021-06-12 NOTE — Telephone Encounter (Signed)
New Message:      Please have Jenna to call her.She said the Nutriienist office that she was referred to was so ugly. They told her they could not see her., she had to have  diabetes or kdney disease

## 2021-06-12 NOTE — Patient Instructions (Signed)
Medication Instructions:  Start Crestor 5 mg every other day Stop Repatha  *If you need a refill on your cardiac medications before your next appointment, please call your pharmacy*   Lab Work: Have Lipid Panel in 3 months December Lab order enclosed   Testing/Procedures: None Ordered   Follow-Up: At [facility], you and your health needs are our priority.  As part of our continuing mission to provide you with exceptional heart care, we have created designated Provider Care Teams.  These Care Teams include your primary Cardiologist (physician) and Advanced Practice Providers (APPs -  Physician Assistants and Nurse Practitioners) who all work together to provide you with the care you need, when you need it.  We recommend signing up for the patient portal called "MyChart".  Sign up information is provided on this After Visit Summary.  MyChart is used to connect with patients for Virtual Visits (Telemedicine).  Patients are able to view lab/test results, encounter notes, upcoming appointments, etc.  Non-urgent messages can be sent to your provider as well.   To learn more about what you can do with MyChart, go to [email or web address].     Your next appointment:  6 months   Call in Dec to schedule March appointment     The format for your next appointment: Office   Provider:  Dr.Hilty  Referral to Dietician  Will be called with appointment

## 2021-06-12 NOTE — Telephone Encounter (Signed)
Message routed to MD to advise on referral. Did not see this mentioned in note.

## 2021-06-14 NOTE — Telephone Encounter (Signed)
We are working to arrange nutrition consultation at [facility] - when that is available, we can advise her.  Dr Lemmie Evens

## 2021-06-14 NOTE — Telephone Encounter (Signed)
Left message that the referral for [facility] location is pending, will contact her when we have info on this

## 2021-06-21 ENCOUNTER — Other Ambulatory Visit: Payer: Medicare PPO

## 2021-06-22 ENCOUNTER — Other Ambulatory Visit: Payer: Medicare PPO

## 2021-06-27 ENCOUNTER — Other Ambulatory Visit: Payer: Self-pay

## 2021-06-27 ENCOUNTER — Other Ambulatory Visit (INDEPENDENT_AMBULATORY_CARE_PROVIDER_SITE_OTHER): Payer: Medicare PPO

## 2021-06-27 DIAGNOSIS — E890 Postprocedural hypothyroidism: Secondary | ICD-10-CM

## 2021-06-27 LAB — T4, FREE: Free T4: 0.95 ng/dL (ref 0.60–1.60)

## 2021-06-27 LAB — TSH: TSH: 0.57 u[IU]/mL (ref 0.35–5.50)

## 2021-07-10 ENCOUNTER — Other Ambulatory Visit (HOSPITAL_COMMUNITY): Payer: Medicare PPO

## 2021-07-23 ENCOUNTER — Ambulatory Visit: Admit: 2021-07-23 | Payer: Medicare PPO | Admitting: Orthopedic Surgery

## 2021-07-23 SURGERY — ARTHROPLASTY, KNEE, TOTAL
Anesthesia: Choice | Site: Knee | Laterality: Right

## 2021-07-23 NOTE — Progress Notes (Signed)
Future Appointments  Date Time Provider [place]  07/24/2021  9:30 AM [name], MD [facility] None  07/31/2021  9:00 AM Liane Comber, NP [facility] None  10/03/2021  4:00 PM Pixie Casino, MD [facility]  11/21/2021  9:00 AM [facility] None  01/23/2022  9:00 AM [name], MD [facility] None  05/22/2022  9:00 AM Philemon Kingdom, MD [facility] None    History of Present Illness:     Patient is a 75 yo DBF with hx/o Labile HTN, HLD, T2_NIDDM  Prediabetes  and Vitamin D Deficiency who presents with c/o    leg pain. Scheduled Rt TKA yesterday by Dr Wynelle Link was cancelled.  She relates that she was told "Hip X-rays - OK" . She described sciatic pains down both posterior buttocks & thighs to her calves exacerbated by standing & walking. She also describes what seems like a separate type of discomfort which she describes as "burning of both ankles & numbness of her toes".     Medications    levothyroxine (SYNTHROID) 50 MCG tablet, Take 1.5 tablets (75 mcg total) by mouth daily.   rosuvastatin (CRESTOR) 5 MG tablet, Take 5 mg every other day   cetirizine-pseudoephedrine (ZYRTEC-D) 5-120 MG tablet, Take 1 tablet by mouth as needed.   fluticasone (FLONASE) 50 MCG/ACT nasal spray, INSTILL 1-2 SPRAYS INTO BOTH NOSTRILS TWICE DAILY AS NEEDED FOR ALLERGIES   pseudoephedrine (SUDAFED) 120 MG 12 hr tablet, Take   1 tablet    2 x /day (every 12 hours)    for Sinus & Chest Congestion (Patient not taking: Reported on 06/12/2021)   butalbital-acetaminophen-caffeine (FIORICET) 50-325-40 MG tablet, TAKE 1 TABLET BY MOUTH EVERY 4 HOURS AS NEEDED FOR SEVERE HEADACHE   naproxen sodium (ANAPROX) 220 MG tablet, Take 440 mg by mouth 2 (two) times daily as needed (pain).   acetic acid-hydrocortisone (VOSOL-HC) OTIC solution, Instill  4 to 5 drops  to affected ear 3 to 3 x /day   cholecalciferol (VITAMIN D3) 25 MCG (1000 UT) tablet, Take 1,000 Units by mouth daily.    meclizine (ANTIVERT) 25 MG tablet, Take      1 tablet      3 x day       if needed for Dizziness /Vertigo   metroNIDAZOLE (METROGEL) 1 % gel, Apply topically daily. Apply  Daily  to Acne Rosacea rash  Problem list She has Labile hypertension; History of prediabetes; Vitamin D deficiency; Hyperlipidemia, mixed; Poor compliance with medication; Benign paroxysmal positional vertigo; TMJ (temporomandibular joint syndrome); Postablative hypothyroidism; BMI 26.0-26.9,adult; Medication Phobia; Multinodular goiter; FH: hypertension; Abnormal glucose; Statin myopathy; Migraine without aura and without status migrainosus, not intractable; Insomnia; and Right sided sciatica on their problem list.   Observations/Objective:  BP 118/70   Pulse 69   Temp 97.9 F (36.6 C)   Resp 16   Ht 5\' 6"  (1.676 m)   Wt 175 lb 6.4 oz (79.6 kg)   SpO2 96%   BMI 28.31 kg/m   HEENT - WNL. Neck - supple.  Chest - Clear equal BS. Cor - Nl HS. RRR w/o sig MGR. PP 1(+). No edema. MS- FROM w/o deformities.  Nl  bilat hip ROM. (-) SLR bilat. Gait Nl. Neuro -  Nl w/o focal abnormalities.   Assessment and Plan:   1. Chronic right-sided low back pain with right-sided sciatica  - MR Lumbar Spine Wo Contrast; Future  - dexamethasone (DECADRON) 4 MG tablet;  Take 1 tab 3 x day -  3 days, then 2 x day - 3 days, then 1 tab daily   Dispense: 20 tablet; Refill: 0  2. Peripheral polyneuropathy  - [employer] Profile, Urine - Vitamin B12 - Sedimentation rate - C-reactive protein - Cyclic citrul peptide antibody, IgG - Anti-Smith antibody  3. Postmenopausal estrogen deficiency  - DG Bone Density; Future      Follow Up Instructions:        I discussed the assessment and treatment plan with the patient. The patient was provided an opportunity to ask questions and all were answered. The patient agreed with the plan and demonstrated an understanding of the instructions.       The patient was advised to call  back or seek an in-person evaluation if the symptoms worsen or if the condition fails to improve as anticipated.    Kirtland Bouchard, MD

## 2021-07-24 ENCOUNTER — Ambulatory Visit: Payer: Medicare PPO | Admitting: Internal Medicine

## 2021-07-24 ENCOUNTER — Telehealth: Payer: Self-pay | Admitting: Internal Medicine

## 2021-07-24 ENCOUNTER — Other Ambulatory Visit: Payer: Self-pay

## 2021-07-24 VITALS — BP 118/70 | HR 69 | Temp 97.9°F | Resp 16 | Ht 66.0 in | Wt 175.4 lb

## 2021-07-24 DIAGNOSIS — E559 Vitamin D deficiency, unspecified: Secondary | ICD-10-CM

## 2021-07-24 DIAGNOSIS — G629 Polyneuropathy, unspecified: Secondary | ICD-10-CM | POA: Diagnosis not present

## 2021-07-24 DIAGNOSIS — G8929 Other chronic pain: Secondary | ICD-10-CM | POA: Diagnosis not present

## 2021-07-24 DIAGNOSIS — M5441 Lumbago with sciatica, right side: Secondary | ICD-10-CM | POA: Diagnosis not present

## 2021-07-24 DIAGNOSIS — Z780 Asymptomatic menopausal state: Secondary | ICD-10-CM | POA: Diagnosis not present

## 2021-07-24 DIAGNOSIS — E538 Deficiency of other specified B group vitamins: Secondary | ICD-10-CM | POA: Diagnosis not present

## 2021-07-24 MED ORDER — DEXAMETHASONE 4 MG PO TABS: ORAL_TABLET | ORAL | 0 refills | Status: DC

## 2021-07-24 NOTE — Patient Instructions (Signed)
Sciatica Sciatica is pain, numbness, weakness, or tingling along the path of the sciatic nerve. The sciatic nerve starts in the lower back and runs down the back of each leg. The nerve controls the muscles in the lower leg and in the back of the knee. It also provides feeling (sensation) to the back of the thigh, the lower leg, and the sole of the foot. Sciatica is a symptom of another medical condition that pinches or puts pressure on the sciatic nerve. Sciatica most often only affects one side of the body. Sciatica usually goes away on its own or with treatment. In some cases, sciatica may come back (recur). What are the causes? This condition is caused by pressure on the sciatic nerve or pinching of the nerve. This may be the result of: A disk in between the bones of the spine bulging out too far (herniated disk). Age-related changes in the spinal disks. A pain disorder that affects a muscle in the buttock. Extra bone growth near the sciatic nerve. A break (fracture) of the pelvis. Pregnancy. Tumor. This is rare. What increases the risk? The following factors may make you more likely to develop this condition: Playing sports that place pressure or stress on the spine. Having poor strength and flexibility. A history of back injury or surgery. Sitting for long periods of time. Doing activities that involve repetitive bending or lifting. Obesity. What are the signs or symptoms? Symptoms can vary from mild to very severe, and they may include: Any of these problems in the lower back, leg, hip, or buttock: Mild tingling, numbness, or dull aches. Burning sensations. Sharp pains. Numbness in the back of the calf or the sole of the foot. Leg weakness. Severe back pain that makes movement difficult. Symptoms may get worse when you cough, sneeze, or laugh, or when you sit or stand for long periods of time. How is this diagnosed? This condition may be diagnosed based on: Your symptoms and  medical history. A physical exam. Blood tests. Imaging tests, such as: X-rays. MRI. CT scan. How is this treated? In many cases, this condition improves on its own without treatment. However, treatment may include: Reducing or modifying physical activity. Exercising and stretching. Icing and applying heat to the affected area. Medicines that help to: Relieve pain and swelling. Relax your muscles. Injections of medicines that help to relieve pain, irritation, and inflammation around the sciatic nerve (steroids). Surgery. Follow these instructions at home: Medicines Take over-the-counter and prescription medicines only as told by your health care provider. Ask your health care provider if the medicine prescribed to you: Requires you to avoid driving or using heavy machinery. Can cause constipation. You may need to take these actions to prevent or treat constipation: Drink enough fluid to keep your urine pale yellow. Take over-the-counter or prescription medicines. Eat foods that are high in fiber, such as beans, whole grains, and fresh fruits and vegetables. Limit foods that are high in fat and processed sugars, such as fried or sweet foods. Managing pain   If directed, put ice on the affected area. Put ice in a plastic bag. Place a towel between your skin and the bag. Leave the ice on for 20 minutes, 2-3 times a day. If directed, apply heat to the affected area. Use the heat source that your health care provider recommends, such as a moist heat pack or a heating pad. Place a towel between your skin and the heat source. Leave the heat on for 20-30 minutes. Remove   the heat if your skin turns bright red. This is especially important if you are unable to feel pain, heat, or cold. You may have a greater risk of getting burned. Activity  Return to your normal activities as told by your health care provider. Ask your health care provider what activities are safe for you. Avoid  activities that make your symptoms worse. Take brief periods of rest throughout the day. When you rest for longer periods, mix in some mild activity or stretching between periods of rest. This will help to prevent stiffness and pain. Avoid sitting for long periods of time without moving. Get up and move around at least one time each hour. Exercise and stretch regularly, as told by your health care provider. Do not lift anything that is heavier than 10 lb (4.5 kg) while you have symptoms of sciatica. When you do not have symptoms, you should still avoid heavy lifting, especially repetitive heavy lifting. When you lift objects, always use proper lifting technique, which includes: Bending your knees. Keeping the load close to your body. Avoiding twisting. General instructions Maintain a healthy weight. Excess weight puts extra stress on your back. Wear supportive, comfortable shoes. Avoid wearing high heels. Avoid sleeping on a mattress that is too soft or too hard. A mattress that is firm enough to support your back when you sleep may help to reduce your pain. Keep all follow-up visits as told by your health care provider. This is important. Contact a health care provider if: You have pain that: Wakes you up when you are sleeping. Gets worse when you lie down. Is worse than you have experienced in the past. Lasts longer than 4 weeks. You have an unexplained weight loss. Get help right away if: You are not able to control when you urinate or have bowel movements (incontinence). You have: Weakness in your lower back, pelvis, buttocks, or legs that gets worse. Redness or swelling of your back. A burning sensation when you urinate. Summary Sciatica is pain, numbness, weakness, or tingling along the path of the sciatic nerve. This condition is caused by pressure on the sciatic nerve or pinching of the nerve. Sciatica can cause pain, numbness, or tingling in the lower back, legs, hips, and  buttocks. Treatment often includes rest, exercise, medicines, and applying ice or heat. =============================  Peripheral Neuropathy Peripheral neuropathy is a type of nerve damage. It affects nerves that carry signals between the spinal cord and the arms, legs, and the rest of the body (peripheral nerves). It does not affect nerves in the spinal cord or brain. In peripheral neuropathy, one nerve or a group of nerves may be damaged. Peripheral neuropathy is a broad category that includes many specific nerve disorders, like diabetic neuropathy, hereditary neuropathy, and carpal tunnel syndrome. What are the causes? This condition may be caused by: Diabetes. This is the most common cause of peripheral neuropathy. Nerve injury. Pressure or stress on a nerve that lasts a long time. Lack (deficiency) of B vitamins. This can result from alcoholism, poor diet, or a restricted diet. Infections. Autoimmune diseases, such as rheumatoid arthritis and systemic lupus erythematosus. Nerve diseases that are passed from parent to child (inherited). Some medicines, such as cancer medicines (chemotherapy). Poisonous (toxic) substances, such as lead and mercury. Too little blood flowing to the legs. Kidney disease. Thyroid disease. In some cases, the cause of this condition is not known. What are the signs or symptoms? Symptoms of this condition depend on which of your nerves is  damaged. Common symptoms include: Loss of feeling (numbness) in the feet, hands, or both. Tingling in the feet, hands, or both. Burning pain. Very sensitive skin. Weakness. Not being able to move a part of the body (paralysis). Muscle twitching. Clumsiness or poor coordination. Loss of balance. Not being able to control your bladder. Feeling dizzy. Sexual problems. How is this diagnosed? Diagnosing and finding the cause of peripheral neuropathy can be difficult. Your health care provider will take your medical  history and do a physical exam. A neurological exam will also be done. This involves checking things that are affected by your brain, spinal cord, and nerves (nervous system). For example, your health care provider will check your reflexes, how you move, and what you can feel. You may have other tests, such as: Blood tests. Electromyogram (EMG) and nerve conduction tests. These tests check nerve function and how well the nerves are controlling the muscles. Imaging tests, such as CT scans or MRI to rule out other causes of your symptoms. Removing a small piece of nerve to be examined in a lab (nerve biopsy). Removing and examining a small amount of the fluid that surrounds the brain and spinal cord (lumbar puncture). How is this treated? Treatment for this condition may involve: Treating the underlying cause of the neuropathy, such as diabetes, kidney disease, or vitamin deficiencies. Stopping medicines that can cause neuropathy, such as chemotherapy. Medicine to help relieve pain. Medicines may include: Prescription or over-the-counter pain medicine. Antiseizure medicine. Antidepressants. Pain-relieving patches that are applied to painful areas of skin. Surgery to relieve pressure on a nerve or to destroy a nerve that is causing pain. Physical therapy to help improve movement and balance. Devices to help you move around (assistive devices). Follow these instructions at home: Medicines Take over-the-counter and prescription medicines only as told by your health care provider. Do not take any other medicines without first asking your health care provider. Do not drive or use heavy machinery while taking prescription pain medicine. Lifestyle  Do not use any products that contain nicotine or tobacco, such as cigarettes and e-cigarettes. Smoking keeps blood from reaching damaged nerves. If you need help quitting, ask your health care provider. Avoid or limit alcohol. Too much alcohol can cause a  vitamin B deficiency, and vitamin B is needed for healthy nerves. Eat a healthy diet. This includes: Eating foods that are high in fiber, such as fresh fruits and vegetables, whole grains, and beans. Limiting foods that are high in fat and processed sugars, such as fried or sweet foods. General instructions  If you have diabetes, work closely with your health care provider to keep your blood sugar under control. If you have numbness in your feet: Check every day for signs of injury or infection. Watch for redness, warmth, and swelling. Wear padded socks and comfortable shoes. These help protect your feet. Develop a good support system. Living with peripheral neuropathy can be stressful. Consider talking with a mental health specialist or joining a support group. Use assistive devices and attend physical therapy as told by your health care provider. This may include using a walker or a cane. Keep all follow-up visits as told by your health care provider. This is important. Contact a health care provider if: You have new signs or symptoms of peripheral neuropathy. You are struggling emotionally from dealing with peripheral neuropathy. Your pain is not well-controlled. Get help right away if: You have an injury or infection that is not healing normally. You develop new  weakness in an arm or leg. You have fallen or do so frequently. Summary Peripheral neuropathy is when the nerves in the arms, or legs are damaged, resulting in numbness, weakness, or pain. There are many causes of peripheral neuropathy, including diabetes, pinched nerves, vitamin deficiencies, autoimmune disease, and hereditary conditions. Diagnosing and finding the cause of peripheral neuropathy can be difficult. Your health care provider will take your medical history, do a physical exam, and do tests, including blood tests and nerve function tests. Treatment involves treating the underlying cause of the neuropathy and taking  medicines to help control pain. Physical therapy and assistive devices may also help. This information is not intended to replace advice given to you by your health care provider. Make sure you discuss any questions you have with your health care provider. Document Revised: 06/27/2020 Document Reviewed: 06/27/2020 Elsevier Patient Education  2022 [employer].

## 2021-07-24 NOTE — Telephone Encounter (Signed)
Called patient to inform of approval with insurance for MRI Lumbar Spine and location of scan, [facility].  Patient requests Bone Density Scan per letter from [facility]. Request sent to Dr. [name], Please enter dexa order to [facility] mammography.

## 2021-07-25 LAB — CYCLIC CITRUL PEPTIDE ANTIBODY, IGG: Cyclic Citrullin Peptide Ab: <16 UNITS

## 2021-07-25 LAB — VITAMIN B12: Vitamin B-12: 323 pg/mL (ref 200–1100)

## 2021-07-25 LAB — C-REACTIVE PROTEIN: CRP: 0.8 mg/L (ref <8.0)

## 2021-07-25 LAB — SEDIMENTATION RATE: Sed Rate: 9 mm/h (ref 0–30)

## 2021-07-25 LAB — ANTI-SMITH ANTIBODY: ENA SM Ab Ser-aCnc: <1 AI

## 2021-07-25 NOTE — Progress Notes (Signed)
-    Vitamin B12 =      Very Low  (Ideal or Goal Vit B12 is between 450 - 1,100)   Low Vit B12 may be associated with Anemia , Fatigue,   Peripheral Neuropathy, Dementia, "Brain Fog", & Depression  - Recommend take a sub-lingual form of Vitamin B12 tablet   1,000 to 5,000 mcg tab that you dissolve under your tongue /Daily   - Can get Baron Sane - best price at [employer] or on [employer] ============================================================ ============================================================  -  Sed Rate  & CRP Inflammation tests       are both Normal &   - Anti - CCP  low  is evidence against Rheumatoid Arthritis &   -  Anti-Smith is also low -  evidence against Lupus as                                               causes of Peripheral Neuropathy.    - Awaiting the  [employer] 24 hour urine collections.   ============================================================ ============================================================

## 2021-07-26 DIAGNOSIS — G629 Polyneuropathy, unspecified: Secondary | ICD-10-CM | POA: Diagnosis not present

## 2021-07-27 NOTE — Progress Notes (Signed)
MEDICARE ANNUAL WELLNESS VISIT AND FOLLOW UP  Assessment:    Annual Medicare Wellness Visit Due annually  Health maintenance reviewed Requested advanced directives/living will paperwork Requested covid 19 booster documenation  Essential hypertension - continue medications, DASH diet, exercise and monitor at home. Call if greater than 130/80.  -     COMPLETE METABOLIC PANEL WITH GFR -     CBC with Differential/Platelet  History of prediabetes Monitor  Postablative hypothyroidism  follows with Dr. Darnell Level, continue med the same at this time, declines TSH today, has upcoming appointment   Hyperlipidemia -declines medications  check lipids, decrease fatty foods, increase activity.  -law lipid clinic, wasn't recommended PCSK9i, was recommended low risk due to high HDL -zetia intolerance, not candidate for nexlitol due to lack of vascular dx; no aortic atherosclerosis per coronary calcium CT  TMJ (temporomandibular joint syndrome) Monitor  Benign paroxysmal positional vertigo due to bilateral vestibular disorder Monitor, takes meclizine PRN, none recent   Multinodular goiter  follows with Dr. Darnell Level, continue med the same at this time  Vitamin D deficiency -     VITAMIN D 25 Hydroxy (Vit-D Deficiency, Fractures)  Poor compliance with medication No emphasized compliance with medications and appointments, patient expressed understanding  Other obsessive-compulsive disorders Patient reports improved symptoms  Monitor with routine screening  Medication management -     Magnesium  BMI 27 - long discussion about weight loss, diet, and exercise -recommended diet heavy in fruits and veggies and low in animal meats, cheeses, and dairy products  Migraines Improved; monitor   Bil leg pain No sx today, intermittent, pending MRI lumbar If normal suggest ortho follow up, PT   Over 30 minutes of exam, counseling, chart review, and critical decision making was performed  Future  Appointments  Date Time Provider [place]  08/09/2021  8:20 AM [facility]315 MR 3 [facility]-315MRI [facility] [facility]  10/03/2021  4:00 PM Pixie Casino, MD [facility]  11/21/2021  9:00 AM [facility] LAB [facility] None  01/23/2022 11:00 AM [name], MD [facility] None  05/22/2022  9:00 AM Philemon Kingdom, MD [facility] None  07/31/2022 11:00 AM Liane Comber, NP [facility] None    Plan:   During the course of the visit the patient was educated and counseled about appropriate screening and preventive services including:   Pneumococcal vaccine  Influenza vaccine Td vaccine Prevnar 13 Screening electrocardiogram Screening mammography Bone densitometry screening Colorectal cancer screening Diabetes screening Glaucoma screening Nutrition counseling  Advanced directives: given info/requested copies   Subjective:   Laura Maynard is a 75 y.o. female who presents for Medicare Annual Wellness Visit and 3 month follow up on hypertension, glucose management, hyperlipidemia, vitamin D def.  She is getting knee injections by Dr. Wynelle Link for bilateral knee arthritis, discussing knee replacements.   Last week she was seen by Dr. Melford Aase for intermittent bilateral thigh aching, pending lumbar MRI, has dexamethasone but hasn't started. Denies numbness/tingling or weakness.   She has long history of migraines, recently improved. Having more allergy related sinus headaches, taking zyrtec D, uses flonase intermittently.    BMI is Body mass index is 27.92 kg/m., she has not been working on diet, has been doing 10 min of stationary cycle daily  Wt Readings from Last 3 Encounters:  07/31/21 173 lb (78.5 kg)  07/24/21 175 lb 6.4 oz (79.6 kg)  06/12/21 175 lb 12.8 oz (79.7 kg)   Her blood pressure has been controlled at home, today their BP is BP: 120/80  She  does not workout. She denies chest pain, shortness of breath, dizziness.    She is on cholesterol medication (prescribed nexlitol, hx  of myalgia with pravastatin and zetia, has seen lipid clinic, coronary calcium score of 43, 64th percentile, NO atherosclerosis, did not see benefit repatha, she has been tolerating rosuvastatin 5 mg every other day) and denies myalgias. Her cholesterol is not at goal. 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 Cruzita Lederer. Her medication was not changed last visit.   Had recent check -  Lab Results  Component Value Date   TSH 0.57 06/27/2021      Medication Review  Current Outpatient Medications (Endocrine & Metabolic):    levothyroxine (SYNTHROID) 50 MCG tablet, Take 1.5 tablets (75 mcg total) by mouth daily.   dexamethasone (DECADRON) 4 MG tablet, Take 1 tab 3 x day - 3 days, then 2 x day - 3 days, then 1 tab daily  Current Outpatient Medications (Cardiovascular):    rosuvastatin (CRESTOR) 5 MG tablet, Take 5 mg every other day  Current Outpatient Medications (Respiratory):    cetirizine-pseudoephedrine (ZYRTEC-D) 5-120 MG tablet, Take 1 tablet by mouth as needed.   fluticasone (FLONASE) 50 MCG/ACT nasal spray, INSTILL 1-2 SPRAYS INTO BOTH NOSTRILS TWICE DAILY AS NEEDED FOR ALLERGIES   pseudoephedrine (SUDAFED) 120 MG 12 hr tablet, Take   1 tablet    2 x /day (every 12 hours)    for Sinus & Chest Congestion (Patient not taking: No sig reported)  Current Outpatient Medications (Analgesics):    butalbital-acetaminophen-caffeine  (FIORICET) 50-325-40 MG tablet, TAKE 1 TABLET BY MOUTH EVERY 4 HOURS AS NEEDED FOR SEVERE HEADACHE   naproxen sodium (ANAPROX) 220 MG tablet, Take 440 mg by mouth 2 (two) times daily as needed (pain).   Current Outpatient Medications (Other):    cholecalciferol (VITAMIN D3) 25 MCG (1000 UT) tablet, Take 1,000 Units by mouth daily.   meclizine (ANTIVERT) 25 MG tablet, Take      1 tablet      3 x day       if needed for Dizziness /Vertigo   metroNIDAZOLE (METROGEL) 1 % gel, Apply topically daily. Apply  Daily  to Acne Rosacea rash   acetic acid-hydrocortisone (VOSOL-HC) OTIC solution, Instill  4 to 5 drops  to affected ear 3 to 3 x /day (Patient not taking: No sig reported)  Allergies: Allergies  Allergen Reactions   Doxycycline Itching    itching   Gabapentin Nausea And Vomiting   Penicillins Hives   Pravastatin     Myalgia   Rosuvastatin Other (See Comments)    Joint/muscle aches, fatigue   Sulfa Antibiotics Hives   Zetia [Ezetimibe]     Myalgia    Current Problems (verified) has Labile hypertension; History of prediabetes; Vitamin D deficiency; Hyperlipidemia, mixed; Poor compliance with medication; Benign paroxysmal positional vertigo;  TMJ (temporomandibular joint syndrome); Postablative hypothyroidism; BMI 26.0-26.9,adult; Medication Phobia; Multinodular goiter; FH: hypertension; Abnormal glucose; Statin myopathy; Migraine without aura and without status migrainosus, not intractable; Insomnia; and Right sided sciatica on their problem list.  Screening Tests Immunization History  Administered Date(s) Administered   Influenza Split 06/30/2013   Influenza, High Dose Seasonal PF 06/26/2015, 07/25/2016, 05/26/2017, 06/30/2018, 06/10/2019, 06/26/2020, 07/02/2021   PFIZER(Purple Top)SARS-COV-2 Vaccination 11/26/2019, 12/25/2019, 06/30/2020   Pneumococcal Conjugate-13 09/14/2014   Pneumococcal Polysaccharide-23 10/16/2011, 02/04/2017   Td 10/16/2011   Zoster, Live 01/14/2007     Preventative care: Last colonoscopy: 2012 cologuard 03/2020 negative Last mammogram: 06/2020, has scheduled 08/2021 DEXA: has scheduled 08/2021  Prior vaccinations: TD or Tdap: 2013  Influenza: 06/2021 Pneumococcal:  2013 Prevnar13:  2015 Shingles/Zostavax: 2008 Covid 19: 2/2, 2021, pfizer + booster   Names of Other Physician/Practitioners you currently use: 1. [facility]- here for primary care 2. 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 [facility], dentist, last visit 2022, q22m  Patient Care Team: [name], MD as PCP - General (Internal Medicine) Debara Pickett Nadean Corwin, MD as PCP - Cardiology (Cardiology) Vanessa Kick, MD (Dermatology) Philemon Kingdom, MD as Consulting Physician (Internal Medicine) Marcial Pacas, MD as Consulting Physician (Neurology) Gatha Mayer, MD as Consulting Physician (Gastroenterology) Rozetta Nunnery, MD as Consulting Physician (Otolaryngology) Gaynelle Arabian, MD as Consulting Physician (Orthopedic Surgery)  Surgical: She  has a past surgical history that includes Eye surgery (1964); Back surgery (1970); Nasal septum surgery (1998); Total abdominal hysterectomy (2000); Knee arthroscopy (2002); Colonoscopy (2006; 03/28/11); and Cataract extraction (Left, 12/2017). Family Her family history includes COPD in her sister; Diabetes in her brother; Emphysema in her father; Lung cancer (age of onset: 49) in her sister; Stroke in her mother; Throat cancer (age of onset: 25) in her brother. Social history  She reports that she has never smoked. She has never used smokeless tobacco. She reports that she does not drink alcohol and does not use drugs.  MEDICARE WELLNESS OBJECTIVES: Physical activity:   Cardiac risk factors:   Depression/mood screen:   Depression screen [facility] 2/9 01/22/2021  Decreased Interest 0  Down, Depressed, Hopeless 0  PHQ - 2 Score  0    ADLs:  In your present state of health, do you have any difficulty performing the following activities: 01/22/2021  Hearing? N  Vision? N  Difficulty concentrating or making decisions? N  Walking or climbing stairs? N  Dressing or bathing? N  Doing errands, shopping? N  Some recent data might be hidden     Cognitive Testing  Alert? Yes  Normal Appearance?Yes  Oriented to person? Yes  Place? Yes   Time? Yes  Recall of three objects?  Yes  Can perform simple calculations? Yes  Displays appropriate judgment?Yes  Can read the correct time from a watch face?Yes  EOL planning: Does Patient Have a Medical Advance Directive?: Yes Type of Advance Directive: Healthcare Power of Attorney, Living will Does patient want to make changes to medical advance directive?: No - Patient declined Copy of [place] in Chart?: No - copy requested   Objective:   Today's Vitals   07/31/21 0858  BP: 120/80  Pulse: 89  Temp: (!) 97.5 F (36.4 C)  SpO2: 99%  Weight: 173 lb (78.5 kg)    Wt Readings from Last 3 Encounters:  07/31/21 173 lb (78.5 kg)  07/24/21 175  lb 6.4 oz (79.6 kg)  06/12/21 175 lb 12.8 oz (79.7 kg)    Body mass index is 27.92 kg/m.  General appearance: alert, no distress, WD/WN,  female HEENT: normocephalic, sclerae anicteric, TMs pearly, nares patent, no discharge or erythema, pharynx normal. R pupil fixed (since teen following eye trauma), L round and reactive  Oral cavity: MMM, no lesions Neck: supple, no lymphadenopathy, no thyromegaly, no masses Heart: RRR, normal S1, S2, no murmurs Lungs: CTA bilaterally, no wheezes, rhonchi, or rales Abdomen: +bs, soft, non tender, non distended, no masses, no hepatomegaly, no splenomegaly Musculoskeletal: non-tender, no effusions; neg straight leg raise; R 2nd mcp joint with some bony enlargement.  Extremities: no edema, no cyanosis, no clubbing Pulses: 2+ symmetric, upper and lower extremities, normal cap  refill Neurological: alert, oriented x 3, CN2-12 intact except pupils, strength normal upper extremities and lower extremities, sensation normal throughout, DTRs 2+ throughout, no cerebellar signs, gait normal Psychiatric: normal affect, behavior normal, pleasant     Medicare Attestation I have personally reviewed: The patient's medical and social history Their use of alcohol, tobacco or illicit drugs Their current medications and supplements The patient's functional ability including ADLs,fall risks, home safety risks, cognitive, and hearing and visual impairment Diet and physical activities Evidence for depression or mood disorders  The patient's weight, height, BMI, and visual acuity have been recorded in the chart.  I have made referrals, counseling, and provided education to the patient based on review of the above and I have provided the patient with a written personalized care plan for preventive services.     Izora Ribas, NP   07/31/2021

## 2021-07-31 ENCOUNTER — Encounter: Payer: Self-pay | Admitting: Adult Health

## 2021-07-31 ENCOUNTER — Ambulatory Visit: Payer: Medicare PPO | Admitting: Adult Health

## 2021-07-31 ENCOUNTER — Other Ambulatory Visit: Payer: Self-pay

## 2021-07-31 VITALS — BP 120/80 | HR 89 | Temp 97.5°F | Wt 173.0 lb

## 2021-07-31 DIAGNOSIS — R6889 Other general symptoms and signs: Secondary | ICD-10-CM | POA: Diagnosis not present

## 2021-07-31 DIAGNOSIS — R0989 Other specified symptoms and signs involving the circulatory and respiratory systems: Secondary | ICD-10-CM

## 2021-07-31 DIAGNOSIS — R7309 Other abnormal glucose: Secondary | ICD-10-CM | POA: Diagnosis not present

## 2021-07-31 DIAGNOSIS — G4700 Insomnia, unspecified: Secondary | ICD-10-CM

## 2021-07-31 DIAGNOSIS — E042 Nontoxic multinodular goiter: Secondary | ICD-10-CM

## 2021-07-31 DIAGNOSIS — G43009 Migraine without aura, not intractable, without status migrainosus: Secondary | ICD-10-CM | POA: Diagnosis not present

## 2021-07-31 DIAGNOSIS — Z6827 Body mass index (BMI) 27.0-27.9, adult: Secondary | ICD-10-CM

## 2021-07-31 DIAGNOSIS — E890 Postprocedural hypothyroidism: Secondary | ICD-10-CM

## 2021-07-31 DIAGNOSIS — E782 Mixed hyperlipidemia: Secondary | ICD-10-CM

## 2021-07-31 DIAGNOSIS — G720 Drug-induced myopathy: Secondary | ICD-10-CM

## 2021-07-31 DIAGNOSIS — Z87898 Personal history of other specified conditions: Secondary | ICD-10-CM | POA: Diagnosis not present

## 2021-07-31 DIAGNOSIS — Z6826 Body mass index (BMI) 26.0-26.9, adult: Secondary | ICD-10-CM

## 2021-07-31 DIAGNOSIS — E559 Vitamin D deficiency, unspecified: Secondary | ICD-10-CM | POA: Diagnosis not present

## 2021-07-31 DIAGNOSIS — Z0001 Encounter for general adult medical examination with abnormal findings: Secondary | ICD-10-CM

## 2021-07-31 DIAGNOSIS — Z0000 Encounter for general adult medical examination without abnormal findings: Secondary | ICD-10-CM

## 2021-07-31 LAB — HEAVY METALS PROFILE, URINE
Arsenic, 24H Ur: <10 mcg/L (ref <=80)
Lead, 24 hr urine: <10 mcg/L (ref <80)
Mercury, 24H Ur: <4 mcg/L (ref <=20)

## 2021-07-31 NOTE — Progress Notes (Signed)
============================================================ -   Test results slightly outside the reference range are not unusual. If there is anything important, I will review this with you,  otherwise it is considered normal test values.  If you have further questions,  please do not hesitate to contact me at the office or via My Chart.  ============================================================ ============================================================  -  Heavy metals - Arsenic,   Lead    & Mercury                                                - All returned in low acceptable ranges   - So    not contributory to Peripheral Neuropathy  ============================================================  ============================================================

## 2021-07-31 NOTE — Patient Instructions (Signed)
    Please send covid 19 booster dates

## 2021-08-01 ENCOUNTER — Other Ambulatory Visit: Payer: Self-pay | Admitting: Adult Health

## 2021-08-01 LAB — LIPID PANEL
Cholesterol: 231 mg/dL — ABNORMAL HIGH (ref <200)
HDL: 109 mg/dL (ref >=50)
LDL Cholesterol (Calc): 106 mg/dL (calc) — ABNORMAL HIGH
Non-HDL Cholesterol (Calc): 122 mg/dL (calc) (ref <130)
Total CHOL/HDL Ratio: 2.1 (calc) (ref <5.0)
Triglycerides: 70 mg/dL (ref <150)

## 2021-08-01 LAB — COMPLETE METABOLIC PANEL WITH GFR
AG Ratio: 2 (calc) (ref 1.0–2.5)
ALT: 11 U/L (ref 6–29)
AST: 15 U/L (ref 10–35)
Albumin: 4.5 g/dL (ref 3.6–5.1)
Alkaline phosphatase (APISO): 103 U/L (ref 37–153)
BUN: 11 mg/dL (ref 7–25)
CO2: 29 mmol/L (ref 20–32)
Calcium: 9.5 mg/dL (ref 8.6–10.4)
Chloride: 106 mmol/L (ref 98–110)
Creat: 0.84 mg/dL (ref 0.60–1.00)
Globulin: 2.3 g/dL (calc) (ref 1.9–3.7)
Glucose, Bld: 85 mg/dL (ref 65–99)
Potassium: 4.1 mmol/L (ref 3.5–5.3)
Sodium: 142 mmol/L (ref 135–146)
Total Bilirubin: 0.5 mg/dL (ref 0.2–1.2)
Total Protein: 6.8 g/dL (ref 6.1–8.1)
eGFR: 73 mL/min/{1.73_m2} (ref >=60)

## 2021-08-01 LAB — CBC WITH DIFFERENTIAL/PLATELET
Absolute Monocytes: 524 cells/uL (ref 200–950)
Basophils Absolute: 51 cells/uL (ref 0–200)
Basophils Relative: 0.9 %
Eosinophils Absolute: 63 cells/uL (ref 15–500)
Eosinophils Relative: 1.1 %
HCT: 38 % (ref 35.0–45.0)
Hemoglobin: 12.2 g/dL (ref 11.7–15.5)
Lymphs Abs: 1784 cells/uL (ref 850–3900)
MCH: 27.2 pg (ref 27.0–33.0)
MCHC: 32.1 g/dL (ref 32.0–36.0)
MCV: 84.8 fL (ref 80.0–100.0)
MPV: 9.6 fL (ref 7.5–12.5)
Monocytes Relative: 9.2 %
Neutro Abs: 3278 cells/uL (ref 1500–7800)
Neutrophils Relative %: 57.5 %
Platelets: 395 10*3/uL (ref 140–400)
RBC: 4.48 10*6/uL (ref 3.80–5.10)
RDW: 12.7 % (ref 11.0–15.0)
Total Lymphocyte: 31.3 %
WBC: 5.7 10*3/uL (ref 3.8–10.8)

## 2021-08-01 LAB — VITAMIN D 25 HYDROXY (VIT D DEFICIENCY, FRACTURES): Vit D, 25-Hydroxy: 27 ng/mL — ABNORMAL LOW (ref 30–100)

## 2021-08-01 LAB — MAGNESIUM: Magnesium: 2.2 mg/dL (ref 1.5–2.5)

## 2021-08-01 MED ORDER — VITAMIN D 25 MCG (1000 UNIT) PO TABS: 1000.0000 [IU] | ORAL_TABLET | Freq: Two times a day (BID) | ORAL | Status: AC

## 2021-08-09 ENCOUNTER — Ambulatory Visit
Admission: RE | Admit: 2021-08-09 | Discharge: 2021-08-09 | Disposition: A | Discharge status: Home / Self Care | Payer: Medicare PPO | Source: Ambulatory Visit | Attending: Internal Medicine | Admitting: Internal Medicine

## 2021-08-09 ENCOUNTER — Other Ambulatory Visit: Payer: Self-pay

## 2021-08-09 ENCOUNTER — Other Ambulatory Visit: Payer: Self-pay | Admitting: Internal Medicine

## 2021-08-09 DIAGNOSIS — M545 Low back pain, unspecified: Secondary | ICD-10-CM | POA: Diagnosis not present

## 2021-08-09 DIAGNOSIS — M79605 Pain in left leg: Secondary | ICD-10-CM

## 2021-08-09 DIAGNOSIS — G8929 Other chronic pain: Secondary | ICD-10-CM

## 2021-08-09 NOTE — Progress Notes (Signed)
============================================================ ============================================================  -    Back MRI - shows mild changes from last MRI in 2014   - Nothing really bad shows up like tumors in spine - which is [place], but                                                                 since you're having ongoing pains,    will schedule appointment with a Neurosurgeon to evaluate you & your MRI.  ============================================================ ============================================================

## 2021-09-11 DIAGNOSIS — Z1231 Encounter for screening mammogram for malignant neoplasm of breast: Secondary | ICD-10-CM | POA: Diagnosis not present

## 2021-09-11 DIAGNOSIS — M85851 Other specified disorders of bone density and structure, right thigh: Secondary | ICD-10-CM | POA: Diagnosis not present

## 2021-09-11 DIAGNOSIS — Z780 Asymptomatic menopausal state: Secondary | ICD-10-CM | POA: Diagnosis not present

## 2021-09-11 DIAGNOSIS — M85852 Other specified disorders of bone density and structure, left thigh: Secondary | ICD-10-CM | POA: Diagnosis not present

## 2021-09-11 LAB — HM DEXA SCAN

## 2021-09-11 LAB — HM MAMMOGRAPHY

## 2021-09-13 ENCOUNTER — Encounter: Payer: Self-pay | Admitting: Internal Medicine

## 2021-10-02 DIAGNOSIS — E782 Mixed hyperlipidemia: Secondary | ICD-10-CM | POA: Diagnosis not present

## 2021-10-02 LAB — LIPID PANEL
Chol/HDL Ratio: 2.2 ratio (ref 0.0–4.4)
Cholesterol, Total: 232 mg/dL — ABNORMAL HIGH (ref 100–199)
HDL: 104 mg/dL (ref >39)
LDL Chol Calc (NIH): 117 mg/dL — ABNORMAL HIGH (ref 0–99)
Triglycerides: 67 mg/dL (ref 0–149)
VLDL Cholesterol Cal: 11 mg/dL (ref 5–40)

## 2021-10-03 ENCOUNTER — Ambulatory Visit (HOSPITAL_BASED_OUTPATIENT_CLINIC_OR_DEPARTMENT_OTHER): Payer: Medicare PPO | Admitting: Internal Medicine

## 2021-10-03 ENCOUNTER — Other Ambulatory Visit: Payer: Self-pay

## 2021-10-03 ENCOUNTER — Encounter (HOSPITAL_BASED_OUTPATIENT_CLINIC_OR_DEPARTMENT_OTHER): Payer: Self-pay | Admitting: Internal Medicine

## 2021-10-03 VITALS — BP 124/60 | HR 90 | Ht 66.0 in | Wt 173.2 lb

## 2021-10-03 DIAGNOSIS — T466X5D Adverse effect of antihyperlipidemic and antiarteriosclerotic drugs, subsequent encounter: Secondary | ICD-10-CM

## 2021-10-03 DIAGNOSIS — M791 Myalgia, unspecified site: Secondary | ICD-10-CM | POA: Diagnosis not present

## 2021-10-03 DIAGNOSIS — E785 Hyperlipidemia, unspecified: Secondary | ICD-10-CM | POA: Diagnosis not present

## 2021-10-03 DIAGNOSIS — R931 Abnormal findings on diagnostic imaging of heart and coronary circulation: Secondary | ICD-10-CM | POA: Diagnosis not present

## 2021-10-03 MED ORDER — ROSUVASTATIN CALCIUM 20 MG PO TABS: ORAL_TABLET | ORAL | 3 refills | Status: DC

## 2021-10-03 NOTE — Patient Instructions (Signed)
Medication Instructions:  INCREASE rosuvastatin (crestor) to 20mg  daily  *If you need a refill on your cardiac medications before your next appointment, please call your pharmacy*   Lab Work: FASTING lab work to check cholesterol in about 3-4 months  If you have labs (blood work) drawn today and your tests are completely normal, you will receive your results only by: [place] (if you have MyChart) OR A paper copy in the mail If you have any lab test that is abnormal or we need to change your treatment, we will call you to review the results.   Testing/Procedures: NONE   Follow-Up: At [facility], you and your health needs are our priority.  As part of our continuing mission to provide you with exceptional heart care, we have created designated Provider Care Teams.  These Care Teams include your primary Cardiologist (physician) and Advanced Practice Providers (APPs -  Physician Assistants and Nurse Practitioners) who all work together to provide you with the care you need, when you need it.  We recommend signing up for the patient portal called "MyChart".  Sign up information is provided on this After Visit Summary.  MyChart is used to connect with patients for Virtual Visits (Telemedicine).  Patients are able to view lab/test results, encounter notes, upcoming appointments, etc.  Non-urgent messages can be sent to your provider as well.   To learn more about what you can do with MyChart, go to [email or web address].    Your next appointment:   3-4 months with Dr. Debara Pickett -- lipid clinic

## 2021-10-03 NOTE — Progress Notes (Signed)
LIPID CLINIC CONSULT NOTE  Chief Complaint:  Follow-up dyslipidemia  Primary Care Physician: [name], MD   Primary Cardiologist:  Pixie Casino, MD  HPI:  Laura Maynard is a 76 y.o. female who is being seen today for the evaluation of dyslipidemia at the request of [name], MD. This is a pleasant 76 year old female who is kindly referred by Dr. Melford Aase for evaluation of dyslipidemia.  According to her she has had a longstanding history of dyslipidemia and is been on multiple therapies including the statins (pravastatin, atorvastatin (and ezetimibe, for which she has had significant side effects including myalgias.  Recently she had a lipid profile which showed total cholesterol 269, triglycerides 57, HDL 99 and LDL 155.  I reviewed her chart and noted that she has no history of coronary disease and few cardiovascular risk factors other than age.  She has no history of hypertension, diabetes and no significant premature coronary disease in her family.  She does have hypothyroidism on low-dose levothyroxine.  Chart review indicated she had a CT angios in 2014 which showed no evidence of pulmonary embolism or pulmonary sarcoidosis.  Also there was no mention of coronary calcification.  08/31/2020  Laura Maynard is seen today in follow-up.  Repeat lipids show persistent elevation with total cholesterol 269, triglycerides 102, HDL 53 and LDL 152.  She has not been on therapy for this.  A CT calcium score did show some coronary calcium with a score 8, 64th percentile for age and sex matched control.  Small area of calcification noted in the distal circumflex artery.  On these findings I would recommend therapy.  I had recommended trying bempedoic acid however the cost would have been $68 a month.  This is felt to be too expensive for her.  We then talked about several other options.  One might be a very low-dose of a statin such as rosuvastatin which is very potent.  She seemed  agreeable to this.  12/07/2020  Laura Maynard is seen today in follow-up.  She has been on low-dose Crestor 5 mg every other day however has significant arthralgias/myalgias which are likely related to the rosuvastatin.  She had similar symptoms with pravastatin and ezetimibe in the past.  I do think we will need to consider an alternative.  In the past we had prescribed bempedoic acid and she was approved for that however the cost was an issue.  I would like to revisit that and she may now be eligible for a grant from [employer] [facility].  Her recent cholesterol showed total 247, triglycerides 50, HDL 118 and LDL 121.  06/12/2021  Laura Maynard is seen today in follow-up.  She reports she has been compliant with the Repatha although her labs 2 weeks ago showed no significant change.  Her total cholesterol still 240 with LDL 121.  This is quite unusual.  She did describe to me how she injected the medication which sounds correct.  There are few explanations as to why she may not have had improvement in this.  One may be that she has a high proportion of LP(a), the other could be a mutation in PCSK9 however that would be unusual given the fact that her cholesterol is really not all that high however does need to be lower based on the fact she has coronary calcium.  We discussed several other options today.  I propose going to atorvastatin however she was deathly afraid of that based on her reading on  the Internet.  She said she would prefer to go back to low-dose rosuvastatin even though it may have caused her side effects.  She is not really prepared to do injections every 2 weeks indefinitely as she thought the medication would only be necessary for short period of time.  10/03/2021  Laura Maynard returns today for follow-up.  Unfortunately her lipids were not significantly improved from prior studies.  Cholesterol now total is 232, triglycerides 67, HDL 104 and LDL 117.  Her labs from August showed total cholesterol 240,  triglycerides 50, HDL 111 and LDL 121.  She reports compliance with rosuvastatin 5 mg every other day and is not having any side effects.  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   High cholesterol    Hypertension    Hypothyroidism    Migraine    Prediabetes    Sarcoidosis, lung ([place]) 15 YRS AGO   NO TREATMENTS   Toxic multinodular goiter 05/27/2014   Vertigo    Vitamin D deficiency     Past Surgical History:  Procedure Laterality Date   BACK SURGERY  1970   CATARACT EXTRACTION Left 12/2017   COLONOSCOPY  2006; 03/28/11   2 small adenomas; normal   EYE SURGERY  1964   right eye   KNEE ARTHROSCOPY  2002   NASAL SEPTUM SURGERY  1998   TOTAL ABDOMINAL HYSTERECTOMY  2000    FAMHx:  Family History  Problem Relation Age of Onset   Lung cancer Sister 44   Throat cancer Brother 85   Stroke Mother    Emphysema Father    COPD Sister    Diabetes Brother    Colon cancer Neg Hx     SOCHx:   reports that she has never smoked. She has never used smokeless tobacco. She reports that she does not drink alcohol and does not use drugs.  ALLERGIES:  Allergies  Allergen Reactions   Doxycycline Itching    itching   Gabapentin Nausea And Vomiting   Penicillins Hives   Pravastatin     Myalgia   Rosuvastatin Other (See Comments)    Joint/muscle aches, fatigue   Sulfa Antibiotics Hives   Zetia [Ezetimibe]     Myalgia    ROS: Pertinent items noted in HPI and remainder of comprehensive ROS otherwise negative.  HOME MEDS: Current Outpatient Medications on File Prior to Visit  Medication Sig Dispense Refill   Acetaminophen 325 MG CAPS Take by mouth.     butalbital-acetaminophen-caffeine (FIORICET) 50-325-40 MG tablet TAKE 1 TABLET BY MOUTH EVERY 4 HOURS AS NEEDED FOR SEVERE HEADACHE 30 tablet 0   cetirizine-pseudoephedrine (ZYRTEC-D) 5-120 MG tablet Take 1 tablet by mouth as needed.     cholecalciferol  (VITAMIN D3) 25 MCG (1000 UNIT) tablet Take 1 tablet (1,000 Units total) by mouth 2 (two) times daily with a meal.     cycloSPORINE (RESTASIS) 0.05 % ophthalmic emulsion Restasis 0.05 % eye drops in a dropperette  INSTILL 1 DROP IN BOTH EYES TWICE DAILY     fluticasone (FLONASE) 50 MCG/ACT nasal spray INSTILL 1-2 SPRAYS INTO BOTH NOSTRILS TWICE DAILY AS NEEDED FOR ALLERGIES 48 g 3   levothyroxine (SYNTHROID) 50 MCG tablet Take 1.5 tablets (75 mcg total) by mouth daily. 135 tablet 3   meclizine (ANTIVERT) 25 MG tablet Take      1 tablet      3 x day  if needed for Dizziness /Vertigo 100 tablet 0   metroNIDAZOLE (METROGEL) 1 % gel Apply topically daily. Apply  Daily  to Acne Rosacea rash 60 g 3   naproxen sodium (ANAPROX) 220 MG tablet Take 440 mg by mouth 2 (two) times daily as needed (pain).     pseudoephedrine (SUDAFED) 120 MG 12 hr tablet Take   1 tablet    2 x /day (every 12 hours)    for Sinus & Chest Congestion 20 tablet 2   rosuvastatin (CRESTOR) 5 MG tablet Take 5 mg every other day 45 tablet 3   acetic acid-hydrocortisone (VOSOL-HC) OTIC solution Instill  4 to 5 drops  to affected ear 3 to 3 x /day (Patient not taking: Reported on 10/03/2021) 10 mL 99   dexamethasone (DECADRON) 4 MG tablet Take 1 tab 3 x day - 3 days, then 2 x day - 3 days, then 1 tab daily (Patient not taking: Reported on 10/03/2021) 20 tablet 0   No current facility-administered medications on file prior to visit.    LABS/IMAGING: Results for orders placed or performed in visit on 06/12/21 (from the past 48 hour(s))  Lipid panel     Status: Abnormal   Collection Time: 10/02/21  9:09 AM  Result Value Ref Range   Cholesterol, Total 232 (H) 100 - 199 mg/dL   Triglycerides 67 0 - 149 mg/dL   HDL 104 >39 mg/dL   VLDL Cholesterol Cal 11 5 - 40 mg/dL   LDL Chol Calc (NIH) 117 (H) 0 - 99 mg/dL   Chol/HDL Ratio 2.2 0.0 - 4.4 ratio    Comment:                                   T. Chol/HDL Ratio                                              Men  Women                               1/2 Avg.Risk  3.4    3.3                                   Avg.Risk  5.0    4.4                                2X Avg.Risk  9.6    7.1                                3X Avg.Risk 23.4   11.0    No results found.  LIPID PANEL:    Component Value Date/Time   CHOL 232 (H) 10/02/2021 0909   TRIG 67 10/02/2021 0909   HDL 104 10/02/2021 0909   CHOLHDL 2.2 10/02/2021 0909   CHOLHDL 2.1 07/31/2021 0940   VLDL 13 02/04/2017 1550   LDLCALC 117 (H) 10/02/2021 0909   LDLCALC 106 (H) 07/31/2021 0940    WEIGHTS: Wt Readings from Last 3 Encounters:  10/03/21 173 lb 3.2  oz (78.6 kg)  07/31/21 173 lb (78.5 kg)  07/24/21 175 lb 6.4 oz (79.6 kg)    VITALS: BP 124/60    Pulse 90    Ht 5\' 6"  (1.676 m)    Wt 173 lb 3.2 oz (78.6 kg)    SpO2 99%    BMI 27.96 kg/m   EXAM: Deferred  EKG: Deferred  ASSESSMENT: Mixed dyslipidemia, high LDL and HDL CAC score of 43, 64th percentile (06/2019) Statin intolerance - myalgias  PLAN: 1.   Mrs. Maynard has not had any significant improvement in her lipids on 5 mg every other day rosuvastatin.  She will really need a higher dose to significantly impact her numbers.  Although she has statin intolerance to other meds she seems to be tolerating rosuvastatin at a lower dose.  I like to try her on a higher 20 mg daily dose to see if this will help her cholesterol more.  There is of course a higher risk of side effects.  She should monitor for that to come on within the next few weeks.  If not then I would recommend repeat lipids in about 3 months and we will follow-up then.  Pixie Casino, MD, [facility], [place] Director of the Advanced Lipid Disorders &  Cardiovascular Risk Reduction Clinic Diplomate of the American Board of Clinical Lipidology Attending Cardiologist  Direct Dial: [phone]   Fax: [phone]  Website:  [email or web address] 10/03/2021, 3:56 PM

## 2021-10-15 ENCOUNTER — Encounter: Payer: Self-pay | Admitting: Internal Medicine

## 2021-11-21 ENCOUNTER — Other Ambulatory Visit: Payer: Self-pay

## 2021-11-21 ENCOUNTER — Other Ambulatory Visit: Payer: Self-pay | Admitting: Internal Medicine

## 2021-11-21 ENCOUNTER — Other Ambulatory Visit (INDEPENDENT_AMBULATORY_CARE_PROVIDER_SITE_OTHER): Payer: Medicare PPO

## 2021-11-21 ENCOUNTER — Other Ambulatory Visit: Payer: Medicare PPO

## 2021-11-21 DIAGNOSIS — H04123 Dry eye syndrome of bilateral lacrimal glands: Secondary | ICD-10-CM | POA: Diagnosis not present

## 2021-11-21 DIAGNOSIS — E890 Postprocedural hypothyroidism: Secondary | ICD-10-CM

## 2021-11-21 DIAGNOSIS — Z961 Presence of intraocular lens: Secondary | ICD-10-CM | POA: Diagnosis not present

## 2021-11-21 DIAGNOSIS — H1010 Acute atopic conjunctivitis, unspecified eye: Secondary | ICD-10-CM | POA: Diagnosis not present

## 2021-11-21 DIAGNOSIS — H31011 Macula scars of posterior pole (postinflammatory) (post-traumatic), right eye: Secondary | ICD-10-CM | POA: Diagnosis not present

## 2021-11-21 DIAGNOSIS — H2701 Aphakia, right eye: Secondary | ICD-10-CM | POA: Diagnosis not present

## 2021-11-21 LAB — TSH: TSH: 0.85 u[IU]/mL (ref 0.35–5.50)

## 2021-11-21 LAB — T4, FREE: Free T4: 0.9 ng/dL (ref 0.60–1.60)

## 2021-11-26 ENCOUNTER — Other Ambulatory Visit: Payer: Self-pay | Admitting: Nurse Practitioner

## 2021-11-26 DIAGNOSIS — G44219 Episodic tension-type headache, not intractable: Secondary | ICD-10-CM

## 2021-12-05 ENCOUNTER — Ambulatory Visit: Payer: Medicare PPO | Admitting: Internal Medicine

## 2022-01-03 DIAGNOSIS — M170 Bilateral primary osteoarthritis of knee: Secondary | ICD-10-CM | POA: Diagnosis not present

## 2022-01-10 ENCOUNTER — Encounter: Payer: Self-pay | Admitting: Internal Medicine

## 2022-01-22 NOTE — Progress Notes (Addendum)
? ?Annual Screening/Preventative Visit ?& Comprehensive Evaluation &  Examination ? ?Future Appointments  ?Date Time Provider Department  ?01/23/2022 11:00 AM [name], MD [facility]  ?02/14/2022  9:30 AM Hilty, Nadean Corwin, MD [facility]  ?05/22/2022  9:00 AM Philemon Kingdom, MD [facility]  ?07/31/2022        Wellness  11:00 AM Liane Comber, NP [facility]  ?01/27/2023 11:00 AM [name], MD [facility]  ? ? ?    This very nice 76 y.o. DBF presents for a Screening / Preventative Visit & comprehensive evaluation and management of multiple medical co-morbidities.  Patient has been followed for labile HTN, HLD, T2_NIDDM  Prediabetes  and Vitamin D Deficiency. 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  ? ? ?     Patient has been followed for labile HTN  since  2012. Patient's BP has been controlled at home and patient denies any cardiac symptoms as chest pain, palpitations, shortness of breath, dizziness or ankle swelling. Today's BP is at goal -  118/76.  ? ? ?    Patient's hyperlipidemia has not been  controlled in the past due to her poor diet & reticence to take any lipid lowering meds.  Dr Debara Pickett has convinced her to take low dose Rosuvastatin.  Last lipids were  not at goal : ? ?Lab Results  ?Component Value Date  ? CHOL 232 (H) 10/02/2021  ? HDL 104 10/02/2021  ? LDLCALC 117 (H) 10/02/2021  ? TRIG 67 10/02/2021  ? CHOLHDL 2.2 10/02/2021  ? ? ? ?    Patient has hx/o prediabetes (A1c 5.7% /2012) and patient denies reactive hypoglycemic symptoms, visual blurring, diabetic polys or paresthesias. Last A1c was  normal & at goal : ? ?Lab Results  ?Component Value Date  ? HGBA1C 5.3 01/23/2021  ? ? ? ?    Finally, patient has history of Vitamin D Deficiency ("10" /2010 & "8" /2018) and  does not supplement Vit D as repeated recommended in the past.  Her  last Vitamin D was still very low : ? ?Lab Results  ?Component Value Date  ? VD25OH  27 (L) 07/31/2021  ? ? ? ?Current Outpatient Medications on File Prior to Visit  ?Medication Sig  ? Acetaminophen 325 MG CAPS Take by mouth.  ? butalbital-acetaminophen-caffeine (FIORICET) 50-325-40 MG tablet TAKE 1 TABLET BY MOUTH EVERY 4 HOURS AS NEEDED FOR SEVERE HEADACHE  ? cetirizine-pseudoephedrine (ZYRTEC-D) 5-120 MG tablet Take 1 tablet by mouth as needed.  ? cholecalciferol (VITAMIN D3) 25 MCG (1000 UNIT) tablet Take 1 tablet (1,000 Units total) by mouth 2 (two) times daily with a meal.  ? cycloSPORINE (RESTASIS) 0.05 % ophthalmic emulsion Restasis 0.05 % eye drops in a dropperette ? INSTILL 1 DROP IN BOTH EYES TWICE DAILY  ? fluticasone (FLONASE) 50 MCG/ACT nasal spray INSTILL 1-2 SPRAYS INTO BOTH NOSTRILS TWICE DAILY AS NEEDED FOR ALLERGIES  ? levothyroxine (SYNTHROID) 50 MCG tablet Take 1.5 tablets (75 mcg total) by mouth daily.  ? meclizine (ANTIVERT) 25 MG tablet Take      1 tablet      3 x day       if needed for Dizziness /Vertigo  ? metroNIDAZOLE (METROGEL) 1 % gel Apply topically daily. Apply  Daily  to Acne Rosacea rash  ? naproxen sodium (ANAPROX) 220 MG tablet Take 440 mg by mouth 2 (two) times daily as needed (pain).  ? rosuvastatin (CRESTOR) 20 MG tablet Take  1 tablet ('20mg'$ ) every day  ? ?No current facility-administered medications on file prior to visit.  ? ? ? ?Allergies  ?Allergen Reactions  ? Doxycycline Itching  ?  itching  ? Gabapentin Nausea And Vomiting  ? Penicillins Hives  ? Pravastatin   ?  Myalgia  ? Rosuvastatin Other (See Comments)  ?  Joint/muscle aches, fatigue  ? Sulfa Antibiotics Hives  ? Zetia [Ezetimibe]   ?  Myalgia  ? ? ? ?Past Medical History:  ?Diagnosis Date  ? Blind right eye   ? legally  ? 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 High cholesterol   ? Hypertension   ? Hypothyroidism   ? Migraine   ? Prediabetes   ? Sarcoidosis, lung ([place]) 15 YRS AGO  ? NO TREATMENTS  ? Toxic multinodular goiter 05/27/2014  ? Vertigo   ? Vitamin D deficiency   ? ? ? ?Health Maintenance   ?Topic Date Due  ? Zoster Vaccines- Shingrix (1 of 2) Never done  ? COVID-19 Vaccine (4 - Booster for Pfizer series) 08/25/2020  ? TETANUS/TDAP  10/15/2021  ? INFLUENZA VACCINE  04/30/2022  ? MAMMOGRAM  09/11/2022  ? Fecal DNA (Cologuard)  04/22/2023  ? Pneumonia Vaccine 50+ Years old  Completed  ? DEXA SCAN  Completed  ? Hepatitis C Screening  Completed  ? HPV VACCINES  Aged Out  ? ? ? ?Immunization History  ?Administered Date(s) Administered  ? Influenza Split 06/30/2013  ? Influenza, High Dose  06/10/2019, 06/26/2020, 07/02/2021  ? PFIZER SARS-COV-2 Vacc 11/26/2019, 12/25/2019, 06/30/2020  ? Pneumococcal -13 09/14/2014  ? Pneumococcal -23 10/16/2011, 02/04/2017  ? Td 10/16/2011  ? Zoster, Live 01/14/2007  ? ? ?Last Colon - 07/15/2005 - Dr Carlean Purl - recc 5 year f/u. - Sent recall letter 04/23/2016 -  Patient aware OVERDUE ? ? ?Last MGM - 09/11/2021 ?Last dexaBMD - 09/11/2021 - Osteopenia (T-2.30) ? ?Past Surgical History:  ?Procedure Laterality Date  ? BACK SURGERY  1970  ? CATARACT EXTRACTION Left 12/2017  ? COLONOSCOPY  2006; 03/28/11  ? 2 small adenomas; normal  ? EYE SURGERY  1964  ? right eye  ? KNEE ARTHROSCOPY  2002  ? NASAL SEPTUM SURGERY  1998  ? TOTAL ABDOMINAL HYSTERECTOMY  2000  ? ? ? ?Family History  ?Problem Relation Age of Onset  ? Lung cancer Sister 86  ? Throat cancer Brother 74  ? Stroke Mother   ? Emphysema Father   ? COPD Sister   ? Diabetes Brother   ? Colon cancer Neg Hx   ? ? ? ?Social History  ? ?Tobacco Use  ? Smoking status: Never  ? Smokeless tobacco: Never  ?Vaping Use  ? Vaping Use: Never used  ?Substance Use Topics  ? Alcohol use: Never  ?  Alcohol/week: 1.0 standard drink  ?  Types: 1 Standard drinks or equivalent per week  ?  Comment: RARE  ? Drug use: Never  ? ? ? ? ROS ?Constitutional: Denies fever, chills, weight loss/gain, headaches, insomnia,  night sweats, and change in appetite. Does c/o fatigue. ?Eyes: Denies redness, blurred vision, diplopia, discharge, itchy, watery eyes.   ?ENT: Denies discharge, congestion, post nasal drip, epistaxis, sore throat, earache, hearing loss, dental pain, Tinnitus, Vertigo, Sinus pain, snoring.  ?Cardio: Denies chest pain, palpitations, irregular heartbeat, syncope, dyspnea, diaphoresis, orthopnea, PND, claudication, edema ?Respiratory: denies cough, dyspnea, DOE, pleurisy, hoarseness, laryngitis, wheezing.  ?Gastrointestinal: Denies dysphagia, heartburn, reflux, water brash, pain, cramps, nausea, vomiting, bloating, diarrhea, constipation, hematemesis, melena,  hematochezia, jaundice, hemorrhoids ?Genitourinary: Denies dysuria, frequency, urgency, nocturia, hesitancy, discharge, hematuria, flank pain ?Breast: Breast lumps, nipple discharge, bleeding.  ?Musculoskeletal: Denies arthralgia, myalgia, stiffness, Jt. Swelling, pain, limp, and strain/sprain. Denies falls. ?Skin: Denies puritis, rash, hives, warts, acne, eczema, changing in skin lesion ?Neuro: No weakness, tremor, incoordination, spasms, paresthesia, pain ?Psychiatric: Denies confusion, memory loss, sensory loss. Denies Depression. ?Endocrine: Denies change in weight, skin, hair change, nocturia, and paresthesia, diabetic polys, visual blurring, hyper / hypo glycemic episodes.  ?Heme/Lymph: No excessive bleeding, bruising, enlarged lymph nodes. ? ?Physical Exam ? ?BP 118/76   Pulse 80   Temp 97.9 ?F (36.6 ?C)   Resp 16   Ht '5\' 6"'$  (1.676 m)   Wt 172 lb (78 kg)   SpO2 98%   BMI 27.76 kg/m?  ? ?General Appearance: Well nourished, well groomed and in no apparent distress. ? ?Eyes: PERRLA, EOMs, conjunctiva no swelling or erythema, normal fundi and vessels. ?Sinuses: No frontal/maxillary tenderness ?ENT/Mouth: EACs patent / TMs  nl. Nares clear without erythema, swelling, mucoid exudates. Oral hygiene is good. No erythema, swelling, or exudate. Tongue normal, non-obstructing. Tonsils not swollen or erythematous. Hearing normal.  ?Neck: Supple, thyroid not palpable. No bruits, nodes or  JVD. ?Respiratory: Respiratory effort normal.  BS equal and clear bilateral without rales, rhonci, wheezing or stridor. ?Cardio: Heart sounds are normal with regular rate and rhythm and no murmurs, rubs or gallops.

## 2022-01-23 ENCOUNTER — Ambulatory Visit (INDEPENDENT_AMBULATORY_CARE_PROVIDER_SITE_OTHER): Payer: Medicare PPO | Admitting: Internal Medicine

## 2022-01-23 ENCOUNTER — Encounter: Payer: Self-pay | Admitting: Internal Medicine

## 2022-01-23 VITALS — BP 118/76 | HR 80 | Temp 97.9°F | Resp 16 | Ht 66.0 in | Wt 172.0 lb

## 2022-01-23 DIAGNOSIS — R7303 Prediabetes: Secondary | ICD-10-CM | POA: Diagnosis not present

## 2022-01-23 DIAGNOSIS — Z0001 Encounter for general adult medical examination with abnormal findings: Secondary | ICD-10-CM

## 2022-01-23 DIAGNOSIS — Z79899 Other long term (current) drug therapy: Secondary | ICD-10-CM

## 2022-01-23 DIAGNOSIS — R0989 Other specified symptoms and signs involving the circulatory and respiratory systems: Secondary | ICD-10-CM

## 2022-01-23 DIAGNOSIS — Z0000 Encounter for general adult medical examination without abnormal findings: Secondary | ICD-10-CM | POA: Diagnosis not present

## 2022-01-23 DIAGNOSIS — R7309 Other abnormal glucose: Secondary | ICD-10-CM | POA: Diagnosis not present

## 2022-01-23 DIAGNOSIS — Z91148 Patient's other noncompliance with medication regimen for other reason: Secondary | ICD-10-CM

## 2022-01-23 DIAGNOSIS — Z1211 Encounter for screening for malignant neoplasm of colon: Secondary | ICD-10-CM

## 2022-01-23 DIAGNOSIS — Z8249 Family history of ischemic heart disease and other diseases of the circulatory system: Secondary | ICD-10-CM | POA: Diagnosis not present

## 2022-01-23 DIAGNOSIS — E559 Vitamin D deficiency, unspecified: Secondary | ICD-10-CM | POA: Diagnosis not present

## 2022-01-23 DIAGNOSIS — E890 Postprocedural hypothyroidism: Secondary | ICD-10-CM

## 2022-01-23 DIAGNOSIS — E782 Mixed hyperlipidemia: Secondary | ICD-10-CM

## 2022-01-23 DIAGNOSIS — Z136 Encounter for screening for cardiovascular disorders: Secondary | ICD-10-CM

## 2022-01-23 NOTE — Patient Instructions (Signed)

## 2022-01-24 ENCOUNTER — Other Ambulatory Visit: Payer: Self-pay | Admitting: Internal Medicine

## 2022-01-24 DIAGNOSIS — N3001 Acute cystitis with hematuria: Secondary | ICD-10-CM

## 2022-01-24 LAB — COMPLETE METABOLIC PANEL WITH GFR
AG Ratio: 2 (calc) (ref 1.0–2.5)
ALT: 12 U/L (ref 6–29)
AST: 20 U/L (ref 10–35)
Albumin: 4.7 g/dL (ref 3.6–5.1)
Alkaline phosphatase (APISO): 105 U/L (ref 37–153)
BUN: 11 mg/dL (ref 7–25)
CO2: 25 mmol/L (ref 20–32)
Calcium: 9.9 mg/dL (ref 8.6–10.4)
Chloride: 107 mmol/L (ref 98–110)
Creat: 0.86 mg/dL (ref 0.60–1.00)
Globulin: 2.3 g/dL (calc) (ref 1.9–3.7)
Glucose, Bld: 86 mg/dL (ref 65–99)
Potassium: 4.7 mmol/L (ref 3.5–5.3)
Sodium: 142 mmol/L (ref 135–146)
Total Bilirubin: 0.4 mg/dL (ref 0.2–1.2)
Total Protein: 7 g/dL (ref 6.1–8.1)
eGFR: 70 mL/min/{1.73_m2} (ref >=60)

## 2022-01-24 LAB — URINALYSIS, ROUTINE W REFLEX MICROSCOPIC
Bilirubin Urine: NEGATIVE
Glucose, UA: NEGATIVE
Hyaline Cast: NONE SEEN /LPF
Ketones, ur: NEGATIVE
Nitrite: NEGATIVE
Specific Gravity, Urine: 1.019 (ref 1.001–1.035)
Squamous Epithelial / HPF: NONE SEEN /HPF (ref <=5)
WBC, UA: >=60 /HPF — AB (ref 0–5)
pH: <=5 (ref 5.0–8.0)

## 2022-01-24 LAB — CBC WITH DIFFERENTIAL/PLATELET
Absolute Monocytes: 473 cells/uL (ref 200–950)
Basophils Absolute: 51 cells/uL (ref 0–200)
Basophils Relative: 0.9 %
Eosinophils Absolute: 51 cells/uL (ref 15–500)
Eosinophils Relative: 0.9 %
HCT: 37.7 % (ref 35.0–45.0)
Hemoglobin: 12.3 g/dL (ref 11.7–15.5)
Lymphs Abs: 1915 cells/uL (ref 850–3900)
MCH: 27.5 pg (ref 27.0–33.0)
MCHC: 32.6 g/dL (ref 32.0–36.0)
MCV: 84.2 fL (ref 80.0–100.0)
MPV: 9.8 fL (ref 7.5–12.5)
Monocytes Relative: 8.3 %
Neutro Abs: 3209 cells/uL (ref 1500–7800)
Neutrophils Relative %: 56.3 %
Platelets: 367 10*3/uL (ref 140–400)
RBC: 4.48 10*6/uL (ref 3.80–5.10)
RDW: 13.4 % (ref 11.0–15.0)
Total Lymphocyte: 33.6 %
WBC: 5.7 10*3/uL (ref 3.8–10.8)

## 2022-01-24 LAB — MICROALBUMIN / CREATININE URINE RATIO
Creatinine, Urine: 142 mg/dL (ref 20–275)
Microalb Creat Ratio: 15 mcg/mg creat (ref <30)
Microalb, Ur: 2.1 mg/dL

## 2022-01-24 LAB — HEMOGLOBIN A1C
Hgb A1c MFr Bld: 5.5 % of total Hgb (ref <5.7)
Mean Plasma Glucose: 111 mg/dL
eAG (mmol/L): 6.2 mmol/L

## 2022-01-24 LAB — VITAMIN D 25 HYDROXY (VIT D DEFICIENCY, FRACTURES): Vit D, 25-Hydroxy: 29 ng/mL — ABNORMAL LOW (ref 30–100)

## 2022-01-24 LAB — MAGNESIUM: Magnesium: 2.3 mg/dL (ref 1.5–2.5)

## 2022-01-24 LAB — INSULIN, RANDOM: Insulin: 7.6 u[IU]/mL

## 2022-01-24 LAB — MICROSCOPIC MESSAGE

## 2022-01-24 NOTE — Progress Notes (Signed)
<><><><><><><><><><><><><><><><><><><><><><><><><><><><><><><><><> ?<><><><><><><><><><><><><><><><><><><><><><><><><><><><><><><><><> ? ?-    U/A suspicious for UTI - will send for culture ?<><><><><><><><><><><><><><><><><><><><><><><><><><><><><><><><><> ? ?- Vitamin D = 29 - Obviously not taking recommended doses ? ? - Recommend increase to Vitamin D 5,000 unit capsule  Daily  EVERY day  ! ?<><><><><><><><><><><><><><><><><><><><><><><><><><><><><><><><><> ? ?-   Vitamin D goal is between 70-100.  ? ?- It is very important as a natural anti-inflammatory and helping the  ?immune system protect against viral infections, like the Covid-19  ? ? ?helping hair, skin, and nails, as well as reducing stroke and heart attack risk.  ? ?- It helps your bones and helps with mood. ? ?- It also decreases numerous cancer risks so    PLEASE   take it as directed.  ? ?- Low Vit D is associated with a 200-300% higher risk for    CANCER  ? ?and 200-300% higher risk for HEART   ATTACK  &  STROKE.   ? ?- It is also associated with higher death rate at younger ages,  ? ?autoimmune diseases like Rheumatoid arthritis, Lupus,  ?Multiple Sclerosis.    ? ?- Also many other serious conditions, like depression, Alzheimer's ? ?Dementia, infertility, muscle aches, fatigue, fibromyalgia  ? ?- just to name a few. ?<><><><><><><><><><><><><><><><><><><><><><><><><><><><><><><><><> ? ?- A1c - Normal  - No Diabetes   - Great ! ?<><><><><><><><><><><><><><><><><><><><><><><><><><><><><><><><><> ? ?- All Else - CBC - Kidneys - Electrolytes - Liver - Magnesium & Thyroid   ? ?- all  Normal / OK ? ?<><><><><><><><><><><><><><><><><><><><><><><><><><><><><><><><><> ?<><><><><><><><><><><><><><><><><><><><><><><><><><><><><><><><><> ? ? ? ? ? ? ? ? ? ? ? ? ? ?

## 2022-01-25 DIAGNOSIS — N3001 Acute cystitis with hematuria: Secondary | ICD-10-CM | POA: Diagnosis not present

## 2022-01-25 NOTE — Progress Notes (Signed)
Patient is aware of lab results and a copy is being mailed to her. -e welch

## 2022-01-27 ENCOUNTER — Other Ambulatory Visit: Payer: Self-pay | Admitting: Internal Medicine

## 2022-01-27 DIAGNOSIS — N3000 Acute cystitis without hematuria: Secondary | ICD-10-CM

## 2022-01-27 LAB — URINE CULTURE
MICRO NUMBER:: 13327502
SPECIMEN QUALITY:: ADEQUATE

## 2022-01-27 MED ORDER — NITROFURANTOIN MONOHYD MACRO 100 MG PO CAPS: ORAL_CAPSULE | ORAL | 0 refills | Status: DC

## 2022-01-27 NOTE — Progress Notes (Signed)
<><><><><><><><><><><><><><><><><><><><><><><><><><><><><><><><><> ?<><><><><><><><><><><><><><><><><><><><><><><><><><><><><><><><><> ? ?-    U/C    is (+) for UTI, sent Rx   Macrobid to drugstore ? ?- Please schedule NV for U/A  & C/S  in 1 month ?

## 2022-01-29 NOTE — Progress Notes (Signed)
PATIENT IS AWARE OF LAB RESULTS AND HAS PICKED UP HER MACROBID FROM THE PHARMACY AND BEGAN TREATMENT. ONE MONTH NV TO RE-CHECK HER URINE HAS BEEN SCHEDULED. Laura Maynard [facility]

## 2022-02-06 DIAGNOSIS — E785 Hyperlipidemia, unspecified: Secondary | ICD-10-CM | POA: Diagnosis not present

## 2022-02-06 LAB — LIPID PANEL
Chol/HDL Ratio: 2.1 ratio (ref 0.0–4.4)
Cholesterol, Total: 241 mg/dL — ABNORMAL HIGH (ref 100–199)
HDL: 114 mg/dL (ref >39)
LDL Chol Calc (NIH): 118 mg/dL — ABNORMAL HIGH (ref 0–99)
Triglycerides: 54 mg/dL (ref 0–149)
VLDL Cholesterol Cal: 9 mg/dL (ref 5–40)

## 2022-02-08 ENCOUNTER — Telehealth: Payer: Medicare PPO | Admitting: Internal Medicine

## 2022-02-14 ENCOUNTER — Encounter: Payer: Self-pay | Admitting: Internal Medicine

## 2022-02-14 ENCOUNTER — Ambulatory Visit: Payer: Medicare PPO | Admitting: Internal Medicine

## 2022-02-14 VITALS — BP 130/76 | HR 83 | Ht 66.0 in | Wt 173.0 lb

## 2022-02-14 DIAGNOSIS — T466X5D Adverse effect of antihyperlipidemic and antiarteriosclerotic drugs, subsequent encounter: Secondary | ICD-10-CM | POA: Diagnosis not present

## 2022-02-14 DIAGNOSIS — E785 Hyperlipidemia, unspecified: Secondary | ICD-10-CM | POA: Diagnosis not present

## 2022-02-14 DIAGNOSIS — M791 Myalgia, unspecified site: Secondary | ICD-10-CM

## 2022-02-14 DIAGNOSIS — T466X5A Adverse effect of antihyperlipidemic and antiarteriosclerotic drugs, initial encounter: Secondary | ICD-10-CM

## 2022-02-14 DIAGNOSIS — R931 Abnormal findings on diagnostic imaging of heart and coronary circulation: Secondary | ICD-10-CM | POA: Diagnosis not present

## 2022-02-14 MED ORDER — NEXLETOL 180 MG PO TABS: 1.0000 | ORAL_TABLET | Freq: Every day | ORAL | 3 refills | Status: DC

## 2022-02-14 NOTE — Patient Instructions (Signed)
Medication Instructions:  START nexletol '180mg'$  daily  *If you need a refill on your cardiac medications before your next appointment, please call your pharmacy*   Lab Work: LP(a) today  FASTING lab work to check cholesterol in 3-4 months   If you have labs (blood work) drawn today and your tests are completely normal, you will receive your results only by: [place] (if you have MyChart) OR A paper copy in the mail If you have any lab test that is abnormal or we need to change your treatment, we will call you to review the results.   Testing/Procedures: NONE   Follow-Up: At [facility], you and your health needs are our priority.  As part of our continuing mission to provide you with exceptional heart care, we have created designated Provider Care Teams.  These Care Teams include your primary Cardiologist (physician) and Advanced Practice Providers (APPs -  Physician Assistants and Nurse Practitioners) who all work together to provide you with the care you need, when you need it.  We recommend signing up for the patient portal called "MyChart".  Sign up information is provided on this After Visit Summary.  MyChart is used to connect with patients for Virtual Visits (Telemedicine).  Patients are able to view lab/test results, encounter notes, upcoming appointments, etc.  Non-urgent messages can be sent to your provider as well.   To learn more about what you can do with MyChart, go to [email or web address].    Your next appointment:   3-4 months with Dr. Debara Pickett -- lipid clinic

## 2022-02-14 NOTE — Progress Notes (Signed)
LIPID CLINIC CONSULT NOTE  Chief Complaint:  Follow-up dyslipidemia  Primary Care Physician: Laura Pinto, MD   Primary Cardiologist:  Laura Casino, MD  HPI:  Laura Maynard is a 76 y.o. female who is being seen today for the evaluation of dyslipidemia at the request of Laura Pinto, MD. This is a pleasant 76 year old female who is kindly referred by Dr. Melford Aase for evaluation of dyslipidemia.  According to her she has had a longstanding history of dyslipidemia and is been on multiple therapies including the statins (pravastatin, atorvastatin (and ezetimibe, for which she has had significant side effects including myalgias.  Recently she had a lipid profile which showed total cholesterol 269, triglycerides 57, HDL 99 and LDL 155.  I reviewed her chart and noted that she has no history of coronary disease and few cardiovascular risk factors other than age.  She has no history of hypertension, diabetes and no significant premature coronary disease in her family.  She does have hypothyroidism on low-dose levothyroxine.  Chart review indicated she had a CT angios in 2014 which showed no evidence of pulmonary embolism or pulmonary sarcoidosis.  Also there was no mention of coronary calcification.  08/31/2020  Laura Maynard is seen today in follow-up.  Repeat lipids show persistent elevation with total cholesterol 269, triglycerides 102, HDL 53 and LDL 152.  She has not been on therapy for this.  A CT calcium score did show some coronary calcium with a score 24, 64th percentile for age and sex matched control.  Small area of calcification noted in the distal circumflex artery.  On these findings I would recommend therapy.  I had recommended trying bempedoic acid however the cost would have been $68 a month.  This is felt to be too expensive for her.  We then talked about several other options.  One might be a very low-dose of a statin such as rosuvastatin which is very potent.  She seemed  agreeable to this.  12/07/2020  Laura Maynard is seen today in follow-up.  She has been on low-dose Crestor 5 mg every other day however has significant arthralgias/myalgias which are likely related to the rosuvastatin.  She had similar symptoms with pravastatin and ezetimibe in the past.  I do think we will need to consider an alternative.  In the past we had prescribed bempedoic acid and she was approved for that however the cost was an issue.  I would like to revisit that and she may now be eligible for a grant from [employer] [facility].  Her recent cholesterol showed total 247, triglycerides 50, HDL 118 and LDL 121.  06/12/2021  Laura Maynard is seen today in follow-up.  She reports she has been compliant with the Repatha although her labs 2 weeks ago showed no significant change.  Her total cholesterol still 240 with LDL 121.  This is quite unusual.  She did describe to me how she injected the medication which sounds correct.  There are few explanations as to why she may not have had improvement in this.  One may be that she has a high proportion of LP(a), the other could be a mutation in PCSK9 however that would be unusual given the fact that her cholesterol is really not all that high however does need to be lower based on the fact she has coronary calcium.  We discussed several other options today.  I propose going to atorvastatin however she was deathly afraid of that based on her reading on  the Internet.  She said she would prefer to go back to low-dose rosuvastatin even though it may have caused her side effects.  She is not really prepared to do injections every 2 weeks indefinitely as she thought the medication would only be necessary for short period of time.  10/03/2021  Laura Maynard returns today for follow-up.  Unfortunately her lipids were not significantly improved from prior studies.  Cholesterol now total is 232, triglycerides 67, HDL 104 and LDL 117.  Her labs from August showed total cholesterol 240,  triglycerides 50, HDL 111 and LDL 121.  She reports compliance with rosuvastatin 5 mg every other day and is not having any side effects.  02/15/2022  Laura Maynard returns today for follow-up.  She reports strict compliance with the increased dose of rosuvastatin 20 mg daily.  Unfortunately there is been no significant change in her lipids.  She is quite frustrated in fact was tearful about this today.  Interestingly she had almost no significant improvement with other statins in the past and was also not able to tolerate Repatha although did have some improvement in her lipids.  Labs now show total cholesterol 241, triglycerides 54, HDL 114 and LDL 118, similar to prior testing which showed total cholesterol 232 and LDL 117.  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   High cholesterol    Hypertension    Hypothyroidism    Migraine    Prediabetes    Sarcoidosis, lung ([place]) 15 YRS AGO   NO TREATMENTS   Toxic multinodular goiter 05/27/2014   Vertigo    Vitamin D deficiency     Past Surgical History:  Procedure Laterality Date   BACK SURGERY  1970   CATARACT EXTRACTION Left 12/2017   COLONOSCOPY  2006; 03/28/11   2 small adenomas; normal   EYE SURGERY  1964   right eye   KNEE ARTHROSCOPY  2002   NASAL SEPTUM SURGERY  1998   TOTAL ABDOMINAL HYSTERECTOMY  2000    FAMHx:  Family History  Problem Relation Age of Onset   Lung cancer Sister 67   Throat cancer Brother 51   Stroke Mother    Emphysema Father    COPD Sister    Diabetes Brother    Colon cancer Neg Hx     SOCHx:   reports that she has never smoked. She has never used smokeless tobacco. She reports that she does not drink alcohol and does not use drugs.  ALLERGIES:  Allergies  Allergen Reactions   Sulfa Antibiotics Hives   Doxycycline Itching    itching   Gabapentin Nausea And Vomiting   Penicillins Hives   Pravastatin     Myalgia    Rosuvastatin Other (See Comments)    Joint/muscle aches, fatigue   Zetia [Ezetimibe]     Myalgia    ROS: Pertinent items noted in HPI and remainder of comprehensive ROS otherwise negative.  HOME MEDS: Current Outpatient Medications on File Prior to Visit  Medication Sig Dispense Refill   Acetaminophen 325 MG CAPS Take by mouth.     butalbital-acetaminophen-caffeine (FIORICET) 50-325-40 MG tablet TAKE 1 TABLET BY MOUTH EVERY 4 HOURS AS NEEDED FOR SEVERE HEADACHE 30 tablet 0   cetirizine-pseudoephedrine (ZYRTEC-D) 5-120 MG tablet Take 1 tablet by mouth as needed.     cholecalciferol (VITAMIN D3) 25 MCG (1000 UNIT) tablet Take 1 tablet (1,000 Units total) by mouth  2 (two) times daily with a meal.     cycloSPORINE (RESTASIS) 0.05 % ophthalmic emulsion Restasis 0.05 % eye drops in a dropperette  INSTILL 1 DROP IN BOTH EYES TWICE DAILY     fluticasone (FLONASE) 50 MCG/ACT nasal spray INSTILL 1-2 SPRAYS INTO BOTH NOSTRILS TWICE DAILY AS NEEDED FOR ALLERGIES 48 g 3   levothyroxine (SYNTHROID) 50 MCG tablet Take 1.5 tablets (75 mcg total) by mouth daily. 135 tablet 3   meclizine (ANTIVERT) 25 MG tablet Take      1 tablet      3 x day       if needed for Dizziness /Vertigo 100 tablet 0   metroNIDAZOLE (METROGEL) 1 % gel Apply topically daily. Apply  Daily  to Acne Rosacea rash 60 g 3   naproxen sodium (ANAPROX) 220 MG tablet Take 440 mg by mouth 2 (two) times daily as needed (pain).     rosuvastatin (CRESTOR) 20 MG tablet Take 1 tablet ('20mg'$ ) every day 90 tablet 3   No current facility-administered medications on file prior to visit.    LABS/IMAGING: No results found for this or any previous visit (from the past 48 hour(s)).  No results found.  LIPID PANEL:    Component Value Date/Time   CHOL 241 (H) 02/06/2022 0901   TRIG 54 02/06/2022 0901   HDL 114 02/06/2022 0901   CHOLHDL 2.1 02/06/2022 0901   CHOLHDL 2.1 07/31/2021 0940   VLDL 13 02/04/2017 1550   LDLCALC 118 (H) 02/06/2022 0901    LDLCALC 106 (H) 07/31/2021 0940    WEIGHTS: Wt Readings from Last 3 Encounters:  02/14/22 173 lb (78.5 kg)  01/23/22 172 lb (78 kg)  10/03/21 173 lb 3.2 oz (78.6 kg)    VITALS: BP 130/76   Pulse 83   Ht '5\' 6"'$  (1.676 m)   Wt 173 lb (78.5 kg)   SpO2 99%   BMI 27.92 kg/m   EXAM: Deferred  EKG: Deferred  ASSESSMENT: Mixed dyslipidemia, high LDL and HDL, probable genetic dyslipidemia CAC score of 43, 64th percentile (06/2019) Statin intolerance - myalgias Suboptimal lipid lowering with Repatha  PLAN: 1.   Mrs. Bottoms has had no significant improvement in her lipids both on 5 and 20 mg rosuvastatin.  This is surprising but suggest there is possibly an abnormality such as an elevated LP(a) fraction of LDL that could be contributing to this.  The statin would not be expected to reduce that significantly.  She did have a small improvement with Repatha but less than expected and did not tolerate the medicine well.  We will go ahead and get a repeat lipid NMR and LP(a).  If the LP(a) is elevated, I would advise that we pursue Leqvio.  Hopefully she will qualify for patient assistance.  This is very well-tolerated with minimal if any side effects but would be expected to hopefully get her cholesterol down, believes LP(a) is elevated.  Unfortunately if this is the case that she would not qualify for current clinical trials with specific LP(a) inhibitors.  Laura Casino, MD, [facility], [place] Director of the Advanced Lipid Disorders &  Cardiovascular Risk Reduction Clinic Diplomate of the American Board of Clinical Lipidology Attending Cardiologist  Direct Dial: [phone]  Fax: [phone]  Website:  [email or web address] 02/14/2022, 10:01 AM

## 2022-02-15 LAB — LIPOPROTEIN A (LPA): Lipoprotein (a): 415.5 nmol/L — ABNORMAL HIGH (ref <75.0)

## 2022-02-18 ENCOUNTER — Telehealth: Payer: Self-pay | Admitting: Internal Medicine

## 2022-02-18 NOTE — Telephone Encounter (Signed)
Faxed leqvio benefits investigation form to [phone]

## 2022-02-21 ENCOUNTER — Encounter: Payer: Self-pay | Admitting: Internal Medicine

## 2022-02-21 DIAGNOSIS — Z1212 Encounter for screening for malignant neoplasm of rectum: Secondary | ICD-10-CM | POA: Diagnosis not present

## 2022-02-21 DIAGNOSIS — Z1211 Encounter for screening for malignant neoplasm of colon: Secondary | ICD-10-CM

## 2022-02-21 LAB — POC HEMOCCULT BLD/STL (HOME/3-CARD/SCREEN)
Card #2 Fecal Occult Blod, POC: NEGATIVE
Card #3 Fecal Occult Blood, POC: NEGATIVE
Fecal Occult Blood, POC: NEGATIVE

## 2022-03-04 ENCOUNTER — Ambulatory Visit (INDEPENDENT_AMBULATORY_CARE_PROVIDER_SITE_OTHER): Payer: Medicare PPO

## 2022-03-04 DIAGNOSIS — N3000 Acute cystitis without hematuria: Secondary | ICD-10-CM

## 2022-03-04 NOTE — Progress Notes (Signed)
Patient presents to the office for a nurse visit to have her urine rechecked. Finished Macrobid and states that she still has urine frequency. No other symptoms, but states that she had no symptoms but the urine frequency before. Vitals taken and recorded.

## 2022-03-04 NOTE — Telephone Encounter (Signed)
Spoke with patient. Leqvio statement of benefits reviewed with her. PA is needed, patient assistance will be needed given high OOP max not being met. Advised will call her once med approved and discuss next steps

## 2022-03-04 NOTE — Telephone Encounter (Signed)
Patient called to check the status of her application

## 2022-03-05 LAB — URINALYSIS, ROUTINE W REFLEX MICROSCOPIC
Bacteria, UA: NONE SEEN /HPF
Bilirubin Urine: NEGATIVE
Glucose, UA: NEGATIVE
Hyaline Cast: NONE SEEN /LPF
Ketones, ur: NEGATIVE
Leukocytes,Ua: NEGATIVE
Nitrite: NEGATIVE
Protein, ur: NEGATIVE
Specific Gravity, Urine: 1.011 (ref 1.001–1.035)
Squamous Epithelial / HPF: NONE SEEN /HPF (ref <=5)
WBC, UA: NONE SEEN /HPF (ref 0–5)
pH: <=5 (ref 5.0–8.0)

## 2022-03-05 LAB — URINE CULTURE
MICRO NUMBER:: 13484646
Result:: NO GROWTH
SPECIMEN QUALITY:: ADEQUATE

## 2022-03-05 NOTE — Progress Notes (Signed)
<><><><><><><><><><><><><><><><><><><><><><><><><><><><><><><><><> <><><><><><><><><><><><><><><><><><><><><><><><><><><><><><><><><>  -   Urine culture finally completed & shows No more infection !   <><><><><><><><><><><><><><><><><><><><><><><><><><><><><><><><><> <><><><><><><><><><><><><><><><><><><><><><><><><><><><><><><><><>

## 2022-03-08 NOTE — Telephone Encounter (Signed)
   Benefits investigation enrollment completed on 02/20/2022  Benefits investigation report notes the following:  Type of insurance: [employer]  OOP Max: $4000 / Met: $126  Deductible: N/A  Co-insurance: N/A  PA required: Y  PA phone number/fax: [phone]  Benefits Summary Details: [employer] PPO Medicare Advantage plan; coverage subject to $40 copay which applies towards $4000 calendar OOP max. Once OOP max is met, Laura Maynard is covered at 100%.   _______________________  PA form + MD note + lab results faxed

## 2022-03-12 NOTE — Telephone Encounter (Signed)
Patient approved for Leqvio injections from 03/08/22 -- 09/07/22  Notified her of this. Reminded her of her benefits summary. Advised patient assistance and explained would need to submit proof of income, with household size and med approval letter. She will work on finding paperwork and bring to office.

## 2022-03-13 NOTE — Telephone Encounter (Signed)
Spoke with Loma Sousa with [facility] - will submit patient for empath/patient assistance  Faxed copy of Leqvio approval letter from [facility] w/proof of income and household size (1) to [phone]

## 2022-03-22 ENCOUNTER — Other Ambulatory Visit: Payer: Self-pay | Admitting: Nurse Practitioner

## 2022-03-22 DIAGNOSIS — G44219 Episodic tension-type headache, not intractable: Secondary | ICD-10-CM

## 2022-04-01 ENCOUNTER — Telehealth: Payer: Self-pay | Admitting: Internal Medicine

## 2022-04-01 NOTE — Addendum Note (Signed)
Addended by: Fidel Levy on: 04/01/2022 12:16 PM   Modules accepted: Orders

## 2022-04-01 NOTE — Telephone Encounter (Signed)
Patient called and update don leqvio  Scheduled 1st injection on 04/16/22 with CVRR 2nd injection due ~ mid-October R/S lipid clinic f/u from 8/31 >> 11/16 Lab order mailed, w/instructions to complete about 1 week prior to appointment

## 2022-04-01 NOTE — Telephone Encounter (Addendum)
Patient is approved to receive Leqvio until 09/29/22 at no cost.  Called [facility] at [phone] to order first dose - option 2  Option 2: for refills  Called in [place] prescription to [facility] by [employer]

## 2022-04-01 NOTE — Telephone Encounter (Signed)
Received call from patient assistance- they were verifying address to the office, and date of Thursday for delivery of patients leqvio.

## 2022-04-16 ENCOUNTER — Ambulatory Visit (INDEPENDENT_AMBULATORY_CARE_PROVIDER_SITE_OTHER): Payer: Medicare PPO | Admitting: Pharmacist Clinician (PhC)/ Clinical Pharmacy Specialist

## 2022-04-16 ENCOUNTER — Encounter: Payer: Self-pay | Admitting: Pharmacist Clinician (PhC)/ Clinical Pharmacy Specialist

## 2022-04-16 DIAGNOSIS — E785 Hyperlipidemia, unspecified: Secondary | ICD-10-CM

## 2022-04-16 NOTE — Progress Notes (Unsigned)
   Pharmacy Visit   Date of Encounter: 04/16/2022 ID: Laura Maynard, DOB 27-Apr-1946, MRN [id number]  PCP:  [name], MD   [facility] Providers Cardiologist:  Pixie Casino, MD { Click to update primary MD,subspecialty MD or APP then REFRESH:1}     Visit Details   VS:  There were no vitals taken for this visit. , BMI There is no height or weight on file to calculate BMI.  Wt Readings from Last 3 Encounters:  03/04/22 173 lb (78.5 kg)  02/14/22 173 lb (78.5 kg)  01/23/22 172 lb (78 kg)     Reason for visit: Leqvio injection Performed today: injection Changes (medications, testing, etc.) : no changes to medications Length of Visit: 10 minutes    Medications Adjustments/Labs and Tests Ordered:  Answered all patient questions regarding Leqvio and her other medications.  Assured her it would be fine to take her Fioricet today.     Patient reported feeling warm after injection.  We opened the door for better air circulation and she immediately felt better.   Bubba Hales, RPH-CPP  04/16/2022 2:47 PM

## 2022-04-16 NOTE — Patient Instructions (Addendum)
Second injection set for Thursday October 19 at 8:30 am

## 2022-04-25 ENCOUNTER — Ambulatory Visit: Payer: Medicare PPO | Admitting: Podiatry

## 2022-04-25 DIAGNOSIS — L603 Nail dystrophy: Secondary | ICD-10-CM | POA: Diagnosis not present

## 2022-04-25 DIAGNOSIS — S90222A Contusion of left lesser toe(s) with damage to nail, initial encounter: Secondary | ICD-10-CM | POA: Diagnosis not present
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
## 2022-04-25 NOTE — Patient Instructions (Signed)

## 2022-04-29 DIAGNOSIS — Z961 Presence of intraocular lens: Secondary | ICD-10-CM | POA: Diagnosis not present

## 2022-04-29 DIAGNOSIS — H2701 Aphakia, right eye: Secondary | ICD-10-CM | POA: Diagnosis not present

## 2022-04-29 DIAGNOSIS — H04123 Dry eye syndrome of bilateral lacrimal glands: Secondary | ICD-10-CM | POA: Diagnosis not present

## 2022-04-29 DIAGNOSIS — H31011 Macula scars of posterior pole (postinflammatory) (post-traumatic), right eye: Secondary | ICD-10-CM | POA: Diagnosis not present

## 2022-04-29 DIAGNOSIS — G43709 Chronic migraine without aura, not intractable, without status migrainosus: Secondary | ICD-10-CM | POA: Diagnosis not present

## 2022-04-30 ENCOUNTER — Ambulatory Visit: Payer: Medicare PPO | Admitting: Nurse Practitioner

## 2022-05-10 ENCOUNTER — Other Ambulatory Visit: Payer: Self-pay | Admitting: Internal Medicine

## 2022-05-15 ENCOUNTER — Encounter: Payer: Self-pay | Admitting: Internal Medicine

## 2022-05-15 ENCOUNTER — Ambulatory Visit: Payer: Medicare PPO | Admitting: Internal Medicine

## 2022-05-15 VITALS — BP 120/76 | HR 93 | Temp 97.9°F | Resp 16 | Ht 66.0 in | Wt 173.6 lb

## 2022-05-15 DIAGNOSIS — J0410 Acute tracheitis without obstruction: Secondary | ICD-10-CM

## 2022-05-15 MED ORDER — DEXAMETHASONE 4 MG PO TABS: ORAL_TABLET | ORAL | 0 refills | Status: DC

## 2022-05-15 MED ORDER — PROMETHAZINE-DM 6.25-15 MG/5ML PO SYRP: ORAL_SOLUTION | ORAL | 1 refills | Status: DC

## 2022-05-15 MED ORDER — BENZONATATE 200 MG PO CAPS: ORAL_CAPSULE | ORAL | 1 refills | Status: DC

## 2022-05-15 MED ORDER — AZITHROMYCIN 250 MG PO TABS: ORAL_TABLET | ORAL | 1 refills | Status: DC

## 2022-05-15 NOTE — Progress Notes (Addendum)
Future Appointments  Date Time Provider Department  05/22/2022  9:00 AM Philemon Kingdom, MD [facility]  07/31/2022  9:30 AM Darrol Jump, NP [facility]  08/09/2022  8:15 AM Marzetta Board, DPM [facility]  08/15/2022  8:45 AM Debara Pickett Nadean Corwin, MD [facility]  01/27/2023 11:00 AM [name], MD [facility]    History of Present Illness:     Patient is a very nice 76 yo DBF presenting with a 1 week prodrome of head & chest congestion and nonproductive barking dry cough.  She denies fever, chills, sweats or dyspnea.   Medications  Current Outpatient Medications (Endocrine & Metabolic):    levothyroxine (SYNTHROID) 50 MCG tablet, TAKE 1 AND 1/2 TABLETS BY MOUTH DAILY  Current Outpatient Medications (Cardiovascular):    inclisiran (LEQVIO) 284 MG/1.5ML SOSY injection, Inject 284 mg into the skin as directed. 1 dose every 3 months x2 then every 6 months thereafter  Current Outpatient Medications (Respiratory):    cetirizine-pseudoephedrine (ZYRTEC-D) 5-120 MG tablet, Take 1 tablet by mouth as needed.   fluticasone (FLONASE) 50 MCG/ACT nasal spray, INSTILL 1-2 SPRAYS INTO BOTH NOSTRILS TWICE DAILY AS NEEDED FOR ALLERGIES  Current Outpatient Medications (Analgesics):    Acetaminophen 325 MG CAPS, Take by mouth.   butalbital-acetaminophen-caffeine (FIORICET) 50-325-40 MG tablet, TAKE 1 TABLET BY MOUTH EVERY 4 HOURS AS NEEDED FOR SEVERE HEADACHE   naproxen sodium (ANAPROX) 220 MG tablet, Take 440 mg by mouth 2 (two) times daily as needed (pain).   Current Outpatient Medications (Other):    cholecalciferol (VITAMIN D3) 25 MCG (1000 UNIT) tablet, Take 1 tablet (1,000 Units total) by mouth 2 (two) times daily with a meal.   cycloSPORINE (RESTASIS) 0.05 % ophthalmic emulsion, Restasis 0.05 % eye drops in a dropperette  INSTILL 1 DROP IN BOTH EYES TWICE DAILY   meclizine (ANTIVERT) 25 MG tablet, Take      1 tablet      3 x day       if needed for Dizziness /Vertigo   metroNIDAZOLE  (METROGEL) 1 % gel, Apply topically daily. Apply  Daily  to Acne Rosacea rash  Problem list She has Labile hypertension; History of prediabetes; Vitamin D deficiency; Hyperlipidemia, mixed; Poor compliance with medication; Benign paroxysmal positional vertigo; TMJ (temporomandibular joint syndrome); Postablative hypothyroidism; BMI 27.0-27.9,adult; Medication Phobia; Multinodular goiter; FH: hypertension; Abnormal glucose; Statin myopathy; Migraine without aura and without status migrainosus, not intractable; Insomnia; and Right sided sciatica on their problem list.   Observations/Objective:  BP 120/76   Pulse 93   Temp 97.9 F (36.6 C)   Resp 16   Ht '5\' 6"'$  (1.676 m)   Wt 173 lb 9.6 oz (78.7 kg)   SpO2 95%   BMI 28.02 kg/m   Persistent dry hacking cough  HEENT - WNL. Neck - supple.  Chest - Few scattered dry rales. No rhonchi /wheezes. Cor - Nl HS. RRR w/o sig MGR. PP 1(+). No edema. MS- FROM w/o deformities.  Gait Nl. Neuro -  Nl w/o focal abnormalities.   Assessment and Plan:  1. Tracheitis  - dexamethasone (DECADRON) 4 MG tablet; Take 1 tab 3 x day - 3 days, then 2 x day - 3 days, then 1 tab daily  Dispense: 20 tablet; Refill: 0  - benzonatate (TESSALON) 200 MG capsule; Take 1 perle 3 x / day to prevent cough  Dispense: 30 capsule; Refill: 1  - azithromycin (ZITHROMAX) 250 MG tablet; Take 2 tablets with Food on  Day 1, then 1 tablet Daily  with Food for Sinusitis / Bronchitis  Dispense: 6 each; Refill: 1  - promethazine-dextromethorphan (PROMETHAZINE-DM) 6.25-15 MG/5ML syrup; Take 1 tsp every 4 hours if needed for cough  Dispense: 240 mL; Refill: 1   Follow Up Instructions:        I discussed the assessment and treatment plan with the patient. The patient was provided an opportunity to ask questions and all were answered. The patient agreed with the plan and demonstrated an understanding of the instructions.       The patient was advised to call back or seek an  in-person evaluation if the symptoms worsen or if the condition fails to improve as anticipated.    Kirtland Bouchard, MD

## 2022-05-22 ENCOUNTER — Encounter: Payer: Self-pay | Admitting: Internal Medicine

## 2022-05-22 ENCOUNTER — Ambulatory Visit: Payer: Medicare PPO | Admitting: Internal Medicine

## 2022-05-22 VITALS — BP 128/86 | HR 74 | Ht 66.0 in | Wt 173.0 lb

## 2022-05-22 DIAGNOSIS — E559 Vitamin D deficiency, unspecified: Secondary | ICD-10-CM | POA: Diagnosis not present

## 2022-05-22 DIAGNOSIS — E042 Nontoxic multinodular goiter: Secondary | ICD-10-CM | POA: Diagnosis not present

## 2022-05-22 DIAGNOSIS — E890 Postprocedural hypothyroidism: Secondary | ICD-10-CM | POA: Diagnosis not present

## 2022-05-22 NOTE — Progress Notes (Signed)
Patient ID: [name], female   DOB: September 29, 1946, 76 y.o.   MRN: [id number]  HPI  Laura Maynard is a 76 y.o.-year-old female, returning for f/u for postablative hypothyroidism after toxic multinodular goiter (TMNG) radioactive iodine treatment. Last visit 1 year ago.  Interim history: Last visit, she had significant knee pain, but this improved after a steroid injection. She had recent bronchitis - on ABx, Dexamethasone.  Reviewed history: Pt had screening labs in 01/2014 and was found to have a low TSH. This was repeated 2x and the TSH continued to decrease.   Thyroid Uptake and scan (05/20/2014) >> uptake 48.5% and scan c/w TMNG.  We started MMI 5 mg bid.  Had RAI tx on 06/09/2014.   After RAI treatment, she developed post-ablative hypothyroidism >> we started levothyroxine 50 g daily. She developed leg cramps, which resolved after stopping levothyroxine but her TSH started to increase, so we change to Tirosint. Unfortunately, she could not afford this and we had to restart levothyroxine at the lower dose, subsequent to be increased back to 50 g daily (on 08/12/2016).  Her TSH became suppressed after increased the dose to 75 mcg daily.    She had itching with a 75 mcg tablet so we switched to only 50 mcg tablets, which are white.  She is tolerating these well.  She continues on levothyroxine 75 mcg every day: - in am - fasting - at least 30 min from b'fast - no calcium - no iron - no multivitamins - no PPIs - not on Biotin  Reviewed her TFTs: Lab Results  Component Value Date   TSH 0.85 11/21/2021   TSH 0.57 06/27/2021   TSH 5.02 05/18/2021   TSH 2.67 11/17/2020   TSH 4.22 07/20/2020   TSH 3.77 01/19/2020   TSH 3.41 10/26/2019   TSH 3.11 04/22/2019   TSH 2.79 10/29/2018   TSH 4.71 (H) 09/17/2018   FREET4 0.90 11/21/2021   FREET4 0.95 06/27/2021   FREET4 0.89 05/18/2021   FREET4 0.88 11/17/2020   FREET4 0.96 10/26/2019   FREET4 0.89 04/22/2019   FREET4 0.95  10/29/2018   FREET4 0.95 10/14/2017   FREET4 1.14 08/28/2017   FREET4 1.11 06/23/2017    Pt denies: - feeling nodules in neck - hoarseness - dysphagia - choking  She also has a history of HTN, HL, anemia.  Vitamin D deficiency:  Reviewed vitamin D levels: Lab Results  Component Value Date   VD25OH 29 (L) 01/23/2022   VD25OH 27 (L) 07/31/2021   VD25OH 26.0 (L) 11/17/2020   VD25OH 25.8 (L) 05/24/2020   VD25OH 22 (L) 01/19/2020   VD25OH 27.83 (L) 10/26/2019   VD25OH 24.26 (L) 04/22/2019   VD25OH 6 (L) 09/17/2018   VD25OH 12 (L) 02/05/2018   VD25OH 16.80 (L) 06/23/2017   She had very low vitamin D levels in the past; she was previously on 5000 units vitamin D daily but was not compliant with this due to headaches and itching.  She was on 1000 units daily at last visit (reduced dose due to concerns for constipation), and I advised her to increase to 2000 units daily.  However at last visit she was still on 1000 units daily.  His vitamin D was low I advised her to at least alternate 1000 with 2000 units every other day.  However, she continues on 1000 units daily.She tells me that this is the maximum amount that she can take per day.  She has knee pains and had steroid  injections in her knees. Dr. Debara Pickett started a statin (Crestor 5) qod but she could not tolerate this due to knee pain. Now also on Rapatha. He has a history of osteopenia.  ROS: + see HPI + Muscle aches/+ joint aches - knees >> much better (after a steroid inj 4-5 mo ago)  I reviewed pt's medications, allergies, PMH, social hx, family hx, and changes were documented in the history of present illness. Otherwise, unchanged from my initial visit note.  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   High cholesterol    Hypertension    Hypothyroidism    Migraine    Prediabetes    Sarcoidosis, lung ([place]) 15 YRS AGO   NO TREATMENTS   Toxic multinodular  goiter 05/27/2014   Vertigo    Vitamin D deficiency    Past Surgical History:  Procedure Laterality Date   BACK SURGERY  1970   CATARACT EXTRACTION Left 12/2017   COLONOSCOPY  2006; 03/28/11   2 small adenomas; normal   EYE SURGERY  1964   right eye   KNEE ARTHROSCOPY  2002   NASAL SEPTUM SURGERY  1998   TOTAL ABDOMINAL HYSTERECTOMY  2000   Social History   Socioeconomic History   Marital status: Divorced    Spouse name: Not on file   Number of children: 0   Years of education: college   Highest education level: Not on file  Occupational History    Comment: Retired  Tobacco Use   Smoking status: Never   Smokeless tobacco: Never  Vaping Use   Vaping Use: Never used  Substance and Sexual Activity   Alcohol use: Never    Alcohol/week: 1.0 standard drink of alcohol    Types: 1 Standard drinks or equivalent per week    Comment: RARE   Drug use: Never   Sexual activity: Not Currently  Other Topics Concern   Not on file  Social History Narrative   Patient is retired and lives at home alone. Patient  has college education.   Caffeine- 3 cups of caffeine daily.  One soda daily.   Right handed.   Social Determinants of Health   Financial Resource Strain: Not on file  Food Insecurity: Not on file  Transportation Needs: Not on file  Physical Activity: Not on file  Stress: Not on file  Social Connections: Not on file  Intimate Partner Violence: Not on file   Current Outpatient Medications on File Prior to Visit  Medication Sig Dispense Refill   Acetaminophen 325 MG CAPS Take by mouth.     azithromycin (ZITHROMAX) 250 MG tablet Take 2 tablets with Food on  Day 1, then 1 tablet Daily with Food for Sinusitis / Bronchitis 6 each 1   benzonatate (TESSALON) 200 MG capsule Take 1 perle 3 x / day to prevent cough 30 capsule 1   butalbital-acetaminophen-caffeine (FIORICET) 50-325-40 MG tablet TAKE 1 TABLET BY MOUTH EVERY 4 HOURS AS NEEDED FOR SEVERE HEADACHE 30 tablet 0    cetirizine-pseudoephedrine (ZYRTEC-D) 5-120 MG tablet Take 1 tablet by mouth as needed.     cholecalciferol (VITAMIN D3) 25 MCG (1000 UNIT) tablet Take 1 tablet (1,000 Units total) by mouth 2 (two) times daily with a meal.     cycloSPORINE (RESTASIS) 0.05 % ophthalmic emulsion Restasis 0.05 % eye drops in a dropperette  INSTILL 1 DROP IN BOTH EYES TWICE DAILY     dexamethasone (  DECADRON) 4 MG tablet Take 1 tab 3 x day - 3 days, then 2 x day - 3 days, then 1 tab daily 20 tablet 0   fluticasone (FLONASE) 50 MCG/ACT nasal spray INSTILL 1-2 SPRAYS INTO BOTH NOSTRILS TWICE DAILY AS NEEDED FOR ALLERGIES 48 g 3   inclisiran (LEQVIO) 284 MG/1.5ML SOSY injection Inject 284 mg into the skin as directed. 1 dose every 3 months x2 then every 6 months thereafter     levothyroxine (SYNTHROID) 50 MCG tablet TAKE 1 AND 1/2 TABLETS BY MOUTH DAILY 150 tablet 3   meclizine (ANTIVERT) 25 MG tablet Take      1 tablet      3 x day       if needed for Dizziness /Vertigo 100 tablet 0   metroNIDAZOLE (METROGEL) 1 % gel Apply topically daily. Apply  Daily  to Acne Rosacea rash 60 g 3   naproxen sodium (ANAPROX) 220 MG tablet Take 440 mg by mouth 2 (two) times daily as needed (pain).     promethazine-dextromethorphan (PROMETHAZINE-DM) 6.25-15 MG/5ML syrup Take 1 tsp every 4 hours if needed for cough 240 mL 1   No current facility-administered medications on file prior to visit.   Allergies  Allergen Reactions   Sulfa Antibiotics Hives   Doxycycline Itching    itching   Gabapentin Nausea And Vomiting   Penicillins Hives   Pravastatin     Myalgia   Rosuvastatin Other (See Comments)    Joint/muscle aches, fatigue   Zetia [Ezetimibe]     Myalgia   Family History  Problem Relation Age of Onset   Lung cancer Sister 11   Throat cancer Brother 25   Stroke Mother    Emphysema Father    COPD Sister    Diabetes Brother    Colon cancer Neg Hx    PE: BP 128/86 (BP Location: Right Arm, Patient Position: Sitting, Cuff  Size: Normal)   Pulse 74   Ht '5\' 6"'$  (1.676 m)   Wt 173 lb (78.5 kg)   SpO2 98%   BMI 27.92 kg/m   Wt Readings from Last 3 Encounters:  05/22/22 173 lb (78.5 kg)  05/15/22 173 lb 9.6 oz (78.7 kg)  03/04/22 173 lb (78.5 kg)   Constitutional: overweight, in NAD Eyes: no exophthalmos ENT: moist mucous membranes, no masses palpated in neck, no cervical lymphadenopathy Cardiovascular: RRR, No MRG Respiratory: CTA B Musculoskeletal: no deformities Skin: moist, warm, no rashes Neurological: no tremor with outstretched hands  ASSESSMENT: 1. Hypothyroidism after RAI tx  -In the past, she wanted to just see PCP for the hypothyroidism, but she returns 6 months ago and would like to continue to follow with me  2. MNG  3. vitamin D deficiency  PLAN:  1. And 2.  Patient with history of thyrotoxicosis due to toxic multinodular goiter, resolved after RAI treatment.  She developed post ablative hypothyroidism and we started levothyroxine.  She initially complained of severe cramps with levothyroxine but she was later found to also have vitamin D deficiency, which is now treated.  We did try Tirosint in the past but this was too expensive.  She developed itching with a 75 mcg levothyroxine tablet so she is now using 50 mcg tablets, which do not have altering agents.  He is tolerating this well. - latest thyroid labs reviewed with pt. >> normal: Lab Results  Component Value Date   TSH 0.85 11/21/2021  - she continues on LT4 75 mcg daily (dose increased  at last visit) - pt feels good on this dose. - we discussed about taking the thyroid hormone every day, with water, >30 minutes before breakfast, separated by >4 hours from acid reflux medications, calcium, iron, multivitamins. Pt. is taking it correctly. - will check thyroid tests 4 to 5 weeks, since she just finished a dexamethasone taper for her bronchitis: TSH and fT4 - we discussed that steroids can influence the thyroid results.  Also, she  started biotin (now off for few days) and we discussed that this also influences the results and is to be off the supplement approximately 1 week before we draw labs - If labs are abnormal, she will need to return for repeat TFTs in 1.5 months - OTW, I will see her back in a year  3.  Vitamin D deficiency  -She had a very low vitamin D level in the past, and unfortunately she could not tolerate higher doses of vitamin D supplement due to constipation. -She continues on 1000 units vitamin D daily, mentioning that this is the maximum amount that she can take.  I advised her to try to alternate 1000 to 2000 units every other day but she could not do this. -Last vitamin D level was only slightly low, 29 on 01/23/2022 -We will not repeat this today.  The above value is an acceptable level for bone health.  Orders Placed This Encounter  Procedures   TSH   T4, free   Philemon Kingdom, MD PhD [facility]

## 2022-05-22 NOTE — Patient Instructions (Addendum)
Please come back for labs in 4-5 weeks. Stay off Biotin for 1 week.  Please take vit D 1000 units daily.   Continue Levothyroxine 75 mcg daily.  Take the thyroid hormone every day, with water, at least 30 minutes before breakfast, separated by at least 4 hours from: - acid reflux medications - calcium - iron - multivitamins  Please come back for a follow-up appointment in 1 year.

## 2022-05-30 ENCOUNTER — Ambulatory Visit: Payer: Medicare PPO | Admitting: Internal Medicine

## 2022-06-12 NOTE — Progress Notes (Signed)
Future Appointments  Date Time Provider Department  06/13/2022 10:30 AM [name], MD [facility]  07/31/2022  9:30 AM Darrol Jump, NP [facility]  08/09/2022  8:15 AM Marzetta Board, DPM [facility]  08/15/2022  8:45 AM Pixie Casino, MD [facility]  01/27/2023 11:00 AM [name], MD [facility]  05/28/2023  9:00 AM Philemon Kingdom, MD [facility]    History of Present Illness:     P_atient is a very nice 76 yo DBF with labile HTN, HLD, Prediabetes, Hypothyroidism,   and Vitamin D Deficiency.who returns for a 1 month f/u of treatment of Tracheitis with a Zpak, decadron taper  & returns with c/o persistent dry hacking tickle tough occasionally associated with Reflux water brash.   Medications  Current Outpatient Medications (Endocrine & Metabolic):    levothyroxine (SYNTHROID) 50 MCG tablet, TAKE 1 AND 1/2 TABLETS BY MOUTH DAILY  Current Outpatient Medications (Cardiovascular):    inclisiran (LEQVIO) 284 MG/1.5ML SOSY injection, Inject 284 mg into the skin as directed. 1 dose every 3 months x2 then every 6 months thereafter  Current Outpatient Medications (Respiratory):    cetirizine-pseudoephedrine (ZYRTEC-D) 5-120 MG tablet, Take 1 tablet by mouth as needed.   fluticasone (FLONASE) 50 MCG/ACT nasal spray, INSTILL 1-2 SPRAYS INTO BOTH NOSTRILS TWICE DAILY AS NEEDED FOR ALLERGIES  Current Outpatient Medications (Analgesics):    Acetaminophen 325 MG CAPS, Take by mouth.   butalbital-acetaminophen-caffeine (FIORICET) 50-325-40 MG tablet, TAKE 1 TABLET BY MOUTH EVERY 4 HOURS AS NEEDED FOR SEVERE HEADACHE   naproxen sodium (ANAPROX) 220 MG tablet, Take 440 mg by mouth 2 (two) times daily as needed (pain).   Current Outpatient Medications (Other):    cholecalciferol (VITAMIN D3) 25 MCG (1000 UNIT) tablet, Take 1 tablet (1,000 Units total) by mouth 2 (two) times daily with a meal.   cycloSPORINE (RESTASIS) 0.05 % ophthalmic emulsion, Restasis 0.05 % eye drops in a  dropperette  INSTILL 1 DROP IN BOTH EYES TWICE DAILY   esomeprazole (NEXIUM) 40 MG capsule, Take  1 capsule  Daily  to Prevent Laryngeal Reflux / Cough   meclizine (ANTIVERT) 25 MG tablet, Take      1 tablet      3 x day       if needed for Dizziness /Vertigo   metroNIDAZOLE (METROGEL) 1 % gel, Apply topically daily. Apply  Daily  to Acne Rosacea rash  Problem list She has Labile hypertension; History of prediabetes; Vitamin D deficiency; Hyperlipidemia, mixed; Poor compliance with medication; Benign paroxysmal positional vertigo; TMJ (temporomandibular joint syndrome); Postablative hypothyroidism; BMI 27.0-27.9,adult; Medication Phobia; Multinodular goiter; FH: hypertension; Abnormal glucose; Statin myopathy; Migraine without aura and without status migrainosus, not intractable; Insomnia; and Right sided sciatica on their problem list.   Observations/Objective:  BP 128/80   Pulse 87   Temp 97.9 F (36.6 C)   Resp 17   Ht '5\' 6"'$  (1.676 m)   Wt 172 lb 9.6 oz (78.3 kg)   SpO2 99%   BMI 27.86 kg/m   Slight hoarseness & raspy dry tickle cough.  HEENT - WNL. Neck - supple.  Chest - Clear equal BS. Cor - Nl HS. RRR w/o sig MGR. PP 1(+). No edema. MS- FROM w/o deformities.  Gait Nl. Neuro -  Nl w/o focal abnormalities.  Assessment and Plan:  1. Subacute cough   2. Laryngopharyngeal reflux (LPR)  - esomeprazole (NEXIUM) 40 MG capsule;  Take  1 capsule  Daily  to Prevent Laryngeal Reflux / Cough   Dispense:  90 capsule; Refill: 3  - Discussed anti-reflux type diet  Follow Up Instructions:       I discussed the assessment and treatment plan with the patient. The patient was provided an opportunity to ask questions and all were answered. The patient agreed with the plan and demonstrated an understanding of the instructions.       The patient was advised to call back or seek an in-person evaluation if the symptoms worsen or if the condition fails to improve as  anticipated.   Kirtland Bouchard, MD

## 2022-06-13 ENCOUNTER — Ambulatory Visit: Payer: Medicare PPO | Admitting: Internal Medicine

## 2022-06-13 ENCOUNTER — Encounter: Payer: Self-pay | Admitting: Internal Medicine

## 2022-06-13 VITALS — BP 128/80 | HR 87 | Temp 97.9°F | Resp 17 | Ht 66.0 in | Wt 172.6 lb

## 2022-06-13 DIAGNOSIS — K219 Gastro-esophageal reflux disease without esophagitis: Secondary | ICD-10-CM | POA: Diagnosis not present

## 2022-06-13 DIAGNOSIS — R052 Subacute cough: Secondary | ICD-10-CM | POA: Diagnosis not present

## 2022-06-13 MED ORDER — DEXAMETHASONE 2 MG PO TABS: ORAL_TABLET | ORAL | 1 refills | Status: DC

## 2022-06-13 MED ORDER — ESOMEPRAZOLE MAGNESIUM 40 MG PO CPDR: DELAYED_RELEASE_CAPSULE | ORAL | 3 refills | Status: DC

## 2022-06-15 ENCOUNTER — Encounter: Payer: Self-pay | Admitting: Internal Medicine

## 2022-06-15 NOTE — Patient Instructions (Signed)
Laryngitis Laryngitis is inflammation of the vocal cords that causes symptoms such as hoarseness or loss of voice. The vocal cords are two bands of muscles in your throat. When you speak, these cords come together and vibrate. The vibrations come out through your mouth as sound. When your vocal cords are inflamed, your voice sounds different. Laryngitis can be temporary (acute) or long-term (chronic). Most cases of acute laryngitis improve with time. Chronic laryngitis is laryngitis that lasts for more than 3 weeks. What are the causes? Acute laryngitis may be caused by: A viral infection. Lots of talking, yelling, or singing. This is also called vocal strain. A bacterial infection. Chronic laryngitis may be caused by: Vocal strain or an injury to the vocal cords. Acid reflux (gastroesophageal reflux disease, or GERD). Allergies, a sinus infection, or postnasal drip. Smoking. Excessive alcohol use. Breathing in chemicals or dust. Growths on the vocal cords. What increases the risk? The following factors may make you more likely to develop this condition: Smoking. Alcohol abuse. Having allergies. Chronic irritants in the workplace, such as toxic fumes. What are the signs or symptoms? Symptoms of this condition may include: Low, hoarse voice. Loss of voice. Dry cough. Sore or dry throat. Stuffy or congested nose. How is this diagnosed? This condition may be diagnosed based on: Your symptoms and a physical exam. Throat culture. Blood test. A procedure in which your health care provider looks at your vocal cords with a mirror or viewing tube (laryngoscopy). How is this treated? Treatment for laryngitis depends on what is causing it. Usually, treatment involves resting your voice and using medicines to soothe your throat. If your laryngitis is caused by a bacterial infection, you may need to take antibiotic medicine. If your laryngitis is caused by a growth, you may need to have a  procedure to remove it. Follow these instructions at home: Medicines Take over-the-counter and prescription medicines only as told by your health care provider. If you were prescribed an antibiotic medicine, take it as told by your health care provider. Do not stop taking the antibiotic even if you start to feel better. Use throat lozenges or sprays to soothe your throat as told by your health care provider. General instructions  Talk as little as possible. To do this: Write instead of talking. Do this until your voice is back to normal. Avoid whispering, which can cause vocal strain. Gargle with a mixture of salt and water 3-4 times a day or as needed. To make salt water, completely dissolve -1 tsp (3-6 g) of salt in 1 cup (237 mL) of warm water. Drink enough fluid to keep your urine pale yellow. Breathe in moist air. Use a humidifier if you live in a dry climate. Do not use any products that contain nicotine or tobacco. These products include cigarettes, chewing tobacco, and vaping devices, such as e-cigarettes. If you need help quitting, ask your health care provider. Contact a health care provider if: You have a fever. You have increasing pain. Your symptoms do not get better in 2 weeks. Get help right away if: You cough up blood. You have difficulty swallowing. You have trouble breathing. Summary Laryngitis is inflammation of the vocal cords that causes symptoms such as hoarseness or loss of voice. Laryngitis can be temporary or long-term. Treatment for laryngitis depends on the cause. It often involves resting your voice and using medicine to soothe your throat. Get help right away if you have difficulty swallowing or breathing or if you cough up  blood.

## 2022-06-26 ENCOUNTER — Other Ambulatory Visit (INDEPENDENT_AMBULATORY_CARE_PROVIDER_SITE_OTHER): Payer: Medicare PPO

## 2022-06-26 DIAGNOSIS — E890 Postprocedural hypothyroidism: Secondary | ICD-10-CM

## 2022-06-26 LAB — TSH: TSH: 2.7 u[IU]/mL (ref 0.35–5.50)

## 2022-06-26 LAB — T4, FREE: Free T4: 0.82 ng/dL (ref 0.60–1.60)

## 2022-07-03 ENCOUNTER — Ambulatory Visit (INDEPENDENT_AMBULATORY_CARE_PROVIDER_SITE_OTHER): Payer: Medicare PPO

## 2022-07-03 VITALS — Temp 97.5°F

## 2022-07-03 DIAGNOSIS — Z23 Encounter for immunization: Secondary | ICD-10-CM | POA: Diagnosis not present

## 2022-07-10 DIAGNOSIS — M170 Bilateral primary osteoarthritis of knee: Secondary | ICD-10-CM | POA: Diagnosis not present

## 2022-07-16 ENCOUNTER — Ambulatory Visit: Payer: Medicare PPO | Admitting: Nurse Practitioner

## 2022-07-16 ENCOUNTER — Encounter: Payer: Self-pay | Admitting: Nurse Practitioner

## 2022-07-16 VITALS — BP 130/72 | HR 87 | Temp 97.3°F | Ht 66.0 in | Wt 171.2 lb

## 2022-07-16 DIAGNOSIS — R202 Paresthesia of skin: Secondary | ICD-10-CM

## 2022-07-16 DIAGNOSIS — I83892 Varicose veins of left lower extremities with other complications: Secondary | ICD-10-CM | POA: Diagnosis not present

## 2022-07-16 DIAGNOSIS — R208 Other disturbances of skin sensation: Secondary | ICD-10-CM | POA: Diagnosis not present

## 2022-07-16 DIAGNOSIS — Z79899 Other long term (current) drug therapy: Secondary | ICD-10-CM | POA: Diagnosis not present

## 2022-07-16 DIAGNOSIS — E782 Mixed hyperlipidemia: Secondary | ICD-10-CM

## 2022-07-16 DIAGNOSIS — R0989 Other specified symptoms and signs involving the circulatory and respiratory systems: Secondary | ICD-10-CM | POA: Diagnosis not present

## 2022-07-16 NOTE — Progress Notes (Signed)
Assessment and Plan:  LIANNI KANAAN was seen today for an episodic visit.  Diagnoses and all order for this visit:  1. Burning sensation of skin Refuses medication for tmt Possible vascular disease d/t varicose veins Follows with Dr. Debara Pickett, Cardiology Asked to addressed NOV if not contributed to most recent injection Inclisiran. Will obtain updated B12 level  - Vitamin B12  2. Paresthesia of bilateral legs  - Vitamin B12  3. Varicose veins of left lower extremity with edema Discussed further work up with vascular to assess for any underlying PAD/PVD Continue to monitor  4. Medication management All medications discussed and reviewed in full. All questions and concerns regarding medications addressed.    - CBC with Differential/Platelet - COMPLETE METABOLIC PANEL WITH GFR - Vitamin B12  5. Hyperlipidemia, mixed Continue Inclisiran Next dose due this week - discuss SE with provider. Discussed lifestyle modifications. Recommended diet heavy in fruits and veggies, omega 3's. Decrease consumption of animal meats, cheeses, and dairy products. Remain active and exercise as tolerated. Continue to monitor. Check and lipids/TSH   6. Labile hypertension Not currently controlled by medications. Possible increase in salt. Discussed DASH (Dietary Approaches to Stop Hypertension) DASH diet is lower in sodium than a typical American diet. Cut back on foods that are high in saturated fat, cholesterol, and trans fats. Eat more whole-grain foods, fish, poultry, and nuts Remain active and exercise as tolerated daily.  Monitor BP at home-Call if greater than 130/80.  Keep BP log for review. Elevated feet if swollen. Check and monitor CMP/CBC  - CBC with Differential/Platelet - COMPLETE METABOLIC PANEL WITH GFR  Notify office for further evaluation and treatment, questions or concerns if s/s fail to improve. The risks and benefits of my recommendations, as well as other treatment  options were discussed with the patient today. Questions were answered.  Further disposition pending results of labs. Discussed med's effects and SE's.    Over 20 minutes of exam, counseling, chart review, and critical decision making was performed.   Future Appointments  Date Time Provider [place]  07/18/2022  8:30 AM [facility] PH[facility]ACIST [facility] None  08/01/2022 10:30 AM Alycia Rossetti, NP [facility] None  08/09/2022  8:15 AM Marzetta Board, DPM [facility]  08/15/2022  8:45 AM Debara Pickett, Nadean Corwin, MD [facility] None  01/27/2023 11:00 AM [name], MD [facility] None  05/28/2023  9:00 AM Philemon Kingdom, MD [facility] None    ------------------------------------------------------------------------------------------------------------------   HPI BP 130/72   Pulse 87   Temp (!) 97.3 F (36.3 C)   Ht '5\' 6"'$  (1.676 m)   Wt 171 lb 3.2 oz (77.7 kg)   SpO2 99%   BMI 27.63 kg/m   76 y.o.female presents for evaluation of BLE edema with a sensation of burning/stinging sensation in the BLE from knee down to toes.  Burning is worse at night.  Reports elevated BP although she does not check her BP in the home.  She has worn compression stockings with some relief.  She admits to eating a high salt diet.    She has a hx of varicose veins.  She follows with Dr. Debara Pickett, Cardiology.  She was recently placed on a new injectable Inclisiran (04/2022) for tmt of hyperlipidemia.  Unsure if this is r/t her new onset of burning.  She is not sure when the burning feeling started.  She will receive her next injection this week.    Denies any recent fall, trauma, injury, CP, heart palpitations.  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   High cholesterol    Hypertension    Hypothyroidism    Migraine    Prediabetes    Sarcoidosis, lung ([facility]) 15 YRS AGO   NO TREATMENTS   Toxic multinodular  goiter 05/27/2014   Vertigo    Vitamin D deficiency      Allergies  Allergen Reactions   Sulfa Antibiotics Hives   Doxycycline Itching    itching   Gabapentin Nausea And Vomiting   Penicillins Hives   Pravastatin     Myalgia   Rosuvastatin Other (See Comments)    Joint/muscle aches, fatigue   Zetia [Ezetimibe]     Myalgia    Current Outpatient Medications on File Prior to Visit  Medication Sig   Acetaminophen 325 MG CAPS Take by mouth.   butalbital-acetaminophen-caffeine (FIORICET) 50-325-40 MG tablet TAKE 1 TABLET BY MOUTH EVERY 4 HOURS AS NEEDED FOR SEVERE HEADACHE   cetirizine-pseudoephedrine (ZYRTEC-D) 5-120 MG tablet Take 1 tablet by mouth as needed.   cholecalciferol (VITAMIN D3) 25 MCG (1000 UNIT) tablet Take 1 tablet (1,000 Units total) by mouth 2 (two) times daily with a meal.   cycloSPORINE (RESTASIS) 0.05 % ophthalmic emulsion Restasis 0.05 % eye drops in a dropperette  INSTILL 1 DROP IN BOTH EYES TWICE DAILY   esomeprazole (NEXIUM) 40 MG capsule Take  1 capsule  Daily  to Prevent Laryngeal Reflux / Cough   fluticasone (FLONASE) 50 MCG/ACT nasal spray INSTILL 1-2 SPRAYS INTO BOTH NOSTRILS TWICE DAILY AS NEEDED FOR ALLERGIES   inclisiran (LEQVIO) 284 MG/1.5ML SOSY injection Inject 284 mg into the skin as directed. 1 dose every 3 months x2 then every 6 months thereafter   levothyroxine (SYNTHROID) 50 MCG tablet TAKE 1 AND 1/2 TABLETS BY MOUTH DAILY   meclizine (ANTIVERT) 25 MG tablet Take      1 tablet      3 x day       if needed for Dizziness /Vertigo   metroNIDAZOLE (METROGEL) 1 % gel Apply topically daily. Apply  Daily  to Acne Rosacea rash   naproxen sodium (ANAPROX) 220 MG tablet Take 440 mg by mouth 2 (two) times daily as needed (pain).   No current facility-administered medications on file prior to visit.    ROS: all negative except what is noted in the HPI.   Physical Exam:  BP 130/72   Pulse 87   Temp (!) 97.3 F (36.3 C)   Ht '5\' 6"'$  (1.676 m)   Wt 171  lb 3.2 oz (77.7 kg)   SpO2 99%   BMI 27.63 kg/m   General Appearance: NAD.  Awake, conversant and cooperative. Eyes: PERRLA, EOMs intact.  Sclera white.  Conjunctiva without erythema. Sinuses: No frontal/maxillary tenderness.  No nasal discharge. Nares patent.  ENT/Mouth: Ext aud canals clear.  Bilateral TMs w/DOL and without erythema or bulging. Hearing intact.  Posterior pharynx without swelling or exudate.  Tonsils without swelling or erythema.  Neck: Supple.  No masses, nodules or thyromegaly. Respiratory: Effort is regular with non-labored breathing. Breath sounds are equal bilaterally without rales, rhonchi, wheezing or stridor.  Cardio: RRR with no MRGs. Brisk peripheral pulses without edema.  Abdomen: Active BS in all four quadrants.  Soft and non-tender without guarding, rebound tenderness, hernias or masses. Lymphatics: Non tender without lymphadenopathy.  Musculoskeletal: Full ROM, 5/5 strength, normal ambulation.  No clubbing or cyanosis. Skin: Appropriate color  for ethnicity. Warm without rashes, lesions, ecchymosis, ulcers.  Neuro: CN II-XII grossly normal. Normal muscle tone without cerebellar symptoms and intact sensation.   Psych: AO X 3,  appropriate mood and affect, insight and judgment.     Darrol Jump, NP 2:03 PM [facility]

## 2022-07-16 NOTE — Patient Instructions (Signed)
Edema ? ?Edema is when you have too much fluid in your body or under your skin. Edema may make your legs, feet, and ankles swell. Swelling often happens in looser tissues, such as around your eyes. This is a common condition. It gets more common as you get older. ?There are many possible causes of edema. These include: ?Eating too much salt (sodium). ?Being on your feet or sitting for a long time. ?Certain medical conditions, such as: ?Pregnancy. ?Heart failure. ?Liver disease. ?Kidney disease. ?Cancer. ?Hot weather may make edema worse. Edema is usually painless. Your skin may look swollen or shiny. ?Follow these instructions at home: ?Medicines ?Take over-the-counter and prescription medicines only as told by your doctor. ?Your doctor may prescribe a medicine to help your body get rid of extra water (diuretic). Take this medicine if you are told to take it. ?Eating and drinking ?Eat a low-salt (low-sodium) diet as told by your doctor. Sometimes, eating less salt may reduce swelling. ?Depending on the cause of your swelling, you may need to limit how much fluid you drink (fluid restriction). ?General instructions ?Raise the injured area above the level of your heart while you are sitting or lying down. ?Do not sit still or stand for a long time. ?Do not wear tight clothes. Do not wear garters on your upper legs. ?Exercise your legs. This can help the swelling go down. ?Wear compression stockings as told by your doctor. It is important that these are the right size. These should be prescribed by your doctor to prevent possible injuries. ?If elastic bandages or wraps are recommended, use them as told by your doctor. ?Contact a doctor if: ?Treatment is not working. ?You have heart, liver, or kidney disease and have symptoms of edema. ?You have sudden and unexplained weight gain. ?Get help right away if: ?You have shortness of breath or chest pain. ?You cannot breathe when you lie down. ?You have pain, redness, or  warmth in the swollen areas. ?You have heart, liver, or kidney disease and get edema all of a sudden. ?You have a fever and your symptoms get worse all of a sudden. ?These symptoms may be an emergency. Get help right away. Call 911. ?Do not wait to see if the symptoms will go away. ?Do not drive yourself to the hospital. ?Summary ?Edema is when you have too much fluid in your body or under your skin. ?Edema may make your legs, feet, and ankles swell. Swelling often happens in looser tissues, such as around your eyes. ?Raise the injured area above the level of your heart while you are sitting or lying down. ?Follow your doctor's instructions about diet and how much fluid you can drink. ?This information is not intended to replace advice given to you by your health care provider. Make sure you discuss any questions you have with your health care provider. ?Document Revised: 05/21/2021 Document Reviewed: 05/21/2021 ?Elsevier Patient Education ? 2023 Elsevier Inc. ? ?

## 2022-07-17 LAB — CBC WITH DIFFERENTIAL/PLATELET
Absolute Monocytes: 640 cells/uL (ref 200–950)
Basophils Absolute: 59 cells/uL (ref 0–200)
Basophils Relative: 0.9 %
Eosinophils Absolute: 20 cells/uL (ref 15–500)
Eosinophils Relative: 0.3 %
HCT: 39.7 % (ref 35.0–45.0)
Hemoglobin: 12.8 g/dL (ref 11.7–15.5)
Lymphs Abs: 1617 cells/uL (ref 850–3900)
MCH: 27.6 pg (ref 27.0–33.0)
MCHC: 32.2 g/dL (ref 32.0–36.0)
MCV: 85.7 fL (ref 80.0–100.0)
MPV: 9.8 fL (ref 7.5–12.5)
Monocytes Relative: 9.7 %
Neutro Abs: 4264 cells/uL (ref 1500–7800)
Neutrophils Relative %: 64.6 %
Platelets: 421 10*3/uL — ABNORMAL HIGH (ref 140–400)
RBC: 4.63 10*6/uL (ref 3.80–5.10)
RDW: 13.2 % (ref 11.0–15.0)
Total Lymphocyte: 24.5 %
WBC: 6.6 10*3/uL (ref 3.8–10.8)

## 2022-07-17 LAB — COMPLETE METABOLIC PANEL WITH GFR
AG Ratio: 1.9 (calc) (ref 1.0–2.5)
ALT: 16 U/L (ref 6–29)
AST: 20 U/L (ref 10–35)
Albumin: 4.7 g/dL (ref 3.6–5.1)
Alkaline phosphatase (APISO): 120 U/L (ref 37–153)
BUN: 11 mg/dL (ref 7–25)
CO2: 27 mmol/L (ref 20–32)
Calcium: 9.8 mg/dL (ref 8.6–10.4)
Chloride: 106 mmol/L (ref 98–110)
Creat: 0.83 mg/dL (ref 0.60–1.00)
Globulin: 2.5 g/dL (calc) (ref 1.9–3.7)
Glucose, Bld: 87 mg/dL (ref 65–99)
Potassium: 4.4 mmol/L (ref 3.5–5.3)
Sodium: 141 mmol/L (ref 135–146)
Total Bilirubin: 0.4 mg/dL (ref 0.2–1.2)
Total Protein: 7.2 g/dL (ref 6.1–8.1)
eGFR: 73 mL/min/{1.73_m2} (ref >=60)

## 2022-07-17 LAB — VITAMIN B12: Vitamin B-12: 736 pg/mL (ref 200–1100)

## 2022-07-18 ENCOUNTER — Ambulatory Visit: Payer: Medicare PPO | Attending: Cardiology | Admitting: Pharmacist

## 2022-07-18 ENCOUNTER — Telehealth: Payer: Self-pay | Admitting: Internal Medicine

## 2022-07-18 VITALS — BP 146/81 | HR 75 | Wt 171.4 lb

## 2022-07-18 DIAGNOSIS — E782 Mixed hyperlipidemia: Secondary | ICD-10-CM

## 2022-07-18 DIAGNOSIS — R319 Hematuria, unspecified: Secondary | ICD-10-CM | POA: Insufficient documentation

## 2022-07-18 NOTE — Progress Notes (Signed)
   Pharmacy Visit   Date of Encounter: 07/18/2022 ID: Laura Maynard, DOB [birth year], MRN [id number]  PCP:  [name], [place] [facility] Cardiologist:  Pixie Casino, MD      Visit Details   VS:  BP (!) 146/81 (BP Location: Right Arm, Patient Position: Sitting, Cuff Size: Normal)   Pulse 75   Wt 171 lb 6.4 oz (77.7 kg)   SpO2 97%   BMI 27.66 kg/m  , BMI Body mass index is 27.66 kg/m.  Wt Readings from Last 3 Encounters:  07/18/22 171 lb 6.4 oz (77.7 kg)  07/16/22 171 lb 3.2 oz (77.7 kg)  06/13/22 172 lb 9.6 oz (78.3 kg)     Reason for visit: Leqvio Injection Performed today: Vitals Changes (medications, testing, etc.) : Patient scheduled for Leqvio injection today however reports a headache, "not feeling well" and BP elevated above normal. Declines injection today. Rescheduled for 1 week. Will have patient see cardiologist/APP. Gave recommendations for home blood pressure cuffs for home monitoring. Length of Visit: 15 minutes  HYPERTENSION CONTROL Vitals:   07/18/22 0831 07/18/22 0836  BP: (!) 143/78 (!) 146/81    The patient's blood pressure is elevated above target today.  In order to address the patient's elevated BP: Blood pressure will be monitored at home to determine if medication changes need to be made.      Medications Adjustments/Labs and Tests Ordered: No orders of the defined types were placed in this encounter.  No orders of the defined types were placed in this encounter.    Alvy Bimler, [facility]  07/18/2022 8:37 AM

## 2022-07-18 NOTE — Telephone Encounter (Signed)
Patient presented to office today 07/18/22 for scheduled Leqvio injection. She expressed to PharmD concerns about her BP and possible SE to Leqvio. Injection was r/s to 10/26. She was scheduled to see APP next week on 10/24. Upon review of schedule, Hilty MD has openings at [place] tomorrow 10/20. Left message for patient to call back to offer one of these appointments. They are 30 min New Patient slots but unfilled -- so OK to schedule per Clarnce Flock., RN

## 2022-07-19 ENCOUNTER — Encounter (HOSPITAL_BASED_OUTPATIENT_CLINIC_OR_DEPARTMENT_OTHER): Payer: Self-pay | Admitting: Internal Medicine

## 2022-07-19 ENCOUNTER — Ambulatory Visit (HOSPITAL_BASED_OUTPATIENT_CLINIC_OR_DEPARTMENT_OTHER): Payer: Medicare PPO | Admitting: Internal Medicine

## 2022-07-19 VITALS — BP 124/80 | HR 81 | Ht 66.0 in | Wt 170.7 lb

## 2022-07-19 DIAGNOSIS — R931 Abnormal findings on diagnostic imaging of heart and coronary circulation: Secondary | ICD-10-CM

## 2022-07-19 DIAGNOSIS — E7841 Elevated Lipoprotein(a): Secondary | ICD-10-CM

## 2022-07-19 DIAGNOSIS — E785 Hyperlipidemia, unspecified: Secondary | ICD-10-CM

## 2022-07-19 DIAGNOSIS — T466X5D Adverse effect of antihyperlipidemic and antiarteriosclerotic drugs, subsequent encounter: Secondary | ICD-10-CM

## 2022-07-19 DIAGNOSIS — M791 Myalgia, unspecified site: Secondary | ICD-10-CM | POA: Diagnosis not present

## 2022-07-19 DIAGNOSIS — T466X5A Adverse effect of antihyperlipidemic and antiarteriosclerotic drugs, initial encounter: Secondary | ICD-10-CM

## 2022-07-19 NOTE — Patient Instructions (Signed)
Medication Instructions:  NO CHANGES  *If you need a refill on your cardiac medications before your next appointment, please call your pharmacy*   Lab Work: FASTING lab work in November 2023  If you have labs (blood work) drawn today and your tests are completely normal, you will receive your results only by: [place] (if you have MyChart) OR A paper copy in the mail If you have any lab test that is abnormal or we need to change your treatment, we will call you to review the results.   Testing/Procedures: NONE   Follow-Up: At [facility], you and your health needs are our priority.  As part of our continuing mission to provide you with exceptional heart care, we have created designated Provider Care Teams.  These Care Teams include your primary Cardiologist (physician) and Advanced Practice Providers (APPs -  Physician Assistants and Nurse Practitioners) who all work together to provide you with the care you need, when you need it.  We recommend signing up for the patient portal called "MyChart".  Sign up information is provided on this After Visit Summary.  MyChart is used to connect with patients for Virtual Visits (Telemedicine).  Patients are able to view lab/test results, encounter notes, upcoming appointments, etc.  Non-urgent messages can be sent to your provider as well.   To learn more about what you can do with MyChart, go to [email or web address].    Your next appointment:    May 2024 -- fasting labs before visit -- will be mailed

## 2022-07-19 NOTE — Progress Notes (Signed)
LIPID CLINIC CONSULT NOTE  Chief Complaint:  Follow-up  Primary Care Physician: [name], MD   Primary Cardiologist:  Pixie Casino, MD  HPI:  Laura Maynard is a 76 y.o. female who is being seen today for the evaluation of dyslipidemia at the request of [name], MD. This is a pleasant 76 year old female who is kindly referred by Dr. Melford Aase for evaluation of dyslipidemia.  According to her she has had a longstanding history of dyslipidemia and is been on multiple therapies including the statins (pravastatin, atorvastatin (and ezetimibe, for which she has had significant side effects including myalgias.  Recently she had a lipid profile which showed total cholesterol 269, triglycerides 57, HDL 99 and LDL 155.  I reviewed her chart and noted that she has no history of coronary disease and few cardiovascular risk factors other than age.  She has no history of hypertension, diabetes and no significant premature coronary disease in her family.  She does have hypothyroidism on low-dose levothyroxine.  Chart review indicated she had a CT angios in 2014 which showed no evidence of pulmonary embolism or pulmonary sarcoidosis.  Also there was no mention of coronary calcification.  08/31/2020  Laura Maynard is seen today in follow-up.  Repeat lipids show persistent elevation with total cholesterol 269, triglycerides 102, HDL 53 and LDL 152.  She has not been on therapy for this.  A CT calcium score did show some coronary calcium with a score 37, 64th percentile for age and sex matched control.  Small area of calcification noted in the distal circumflex artery.  On these findings I would recommend therapy.  I had recommended trying bempedoic acid however the cost would have been $68 a month.  This is felt to be too expensive for her.  We then talked about several other options.  One might be a very low-dose of a statin such as rosuvastatin which is very potent.  She seemed agreeable to  this.  12/07/2020  Laura Maynard is seen today in follow-up.  She has been on low-dose Crestor 5 mg every other day however has significant arthralgias/myalgias which are likely related to the rosuvastatin.  She had similar symptoms with pravastatin and ezetimibe in the past.  I do think we will need to consider an alternative.  In the past we had prescribed bempedoic acid and she was approved for that however the cost was an issue.  I would like to revisit that and she may now be eligible for a grant from [employer].  Her recent cholesterol showed total 247, triglycerides 50, HDL 118 and LDL 121.  06/12/2021  Laura Maynard is seen today in follow-up.  She reports she has been compliant with the Repatha although her labs 2 weeks ago showed no significant change.  Her total cholesterol still 240 with LDL 121.  This is quite unusual.  She did describe to me how she injected the medication which sounds correct.  There are few explanations as to why she may not have had improvement in this.  One may be that she has a high proportion of LP(a), the other could be a mutation in PCSK9 however that would be unusual given the fact that her cholesterol is really not all that high however does need to be lower based on the fact she has coronary calcium.  We discussed several other options today.  I propose going to atorvastatin however she was deathly afraid of that based on her reading on the  Internet.  She said she would prefer to go back to low-dose rosuvastatin even though it may have caused her side effects.  She is not really prepared to do injections every 2 weeks indefinitely as she thought the medication would only be necessary for short period of time.  10/03/2021  Laura Maynard returns today for follow-up.  Unfortunately her lipids were not significantly improved from prior studies.  Cholesterol now total is 232, triglycerides 67, HDL 104 and LDL 117.  Her labs from August showed total cholesterol 240, triglycerides  50, HDL 111 and LDL 121.  She reports compliance with rosuvastatin 5 mg every other day and is not having any side effects.  02/15/2022  Laura Maynard returns today for follow-up.  She reports strict compliance with the increased dose of rosuvastatin 20 mg daily.  Unfortunately there is been no significant change in her lipids.  She is quite frustrated in fact was tearful about this today.  Interestingly she had almost no significant improvement with other statins in the past and was also not able to tolerate Repatha although did have some improvement in her lipids.  Labs now show total cholesterol 241, triglycerides 54, HDL 114 and LDL 118, similar to prior testing which showed total cholesterol 232 and LDL 117.  07/19/2022  Laura Maynard returns today for follow-up of her lipids.  She recently was seen for her second injection of Leqvio.  This was postponed due to concern about hypertension.  She had initially seen her primary care provider because of some leg swelling and was noted to be a little hypertensive.  They had indicated that they felt it might be related to [facility] and then she was seen by our pharmacist who decided to postpone her shot and have her follow-up with me.  Blood pressure today however is normal.  When I asked her about her diet she says she has been eating more processed foods and sodium recently which I suspect may be related to her elevated blood pressure.  It is highly unlikely to be related to her Leqvio.  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   High cholesterol    Hypertension    Hypothyroidism    Migraine    Prediabetes    Sarcoidosis, lung ([place]) 15 YRS AGO   NO TREATMENTS   Toxic multinodular goiter 05/27/2014   Vertigo    Vitamin D deficiency     Past Surgical History:  Procedure Laterality Date   BACK SURGERY  1970   CATARACT EXTRACTION Left 12/2017   COLONOSCOPY  2006; 03/28/11   2  small adenomas; normal   EYE SURGERY  1964   right eye   KNEE ARTHROSCOPY  2002   NASAL SEPTUM SURGERY  1998   TOTAL ABDOMINAL HYSTERECTOMY  2000    FAMHx:  Family History  Problem Relation Age of Onset   Lung cancer Sister 48   Throat cancer Brother 27   Stroke Mother    Emphysema Father    COPD Sister    Diabetes Brother    Colon cancer Neg Hx     SOCHx:   reports that she has never smoked. She has never used smokeless tobacco. She reports that she does not drink alcohol and does not use drugs.  ALLERGIES:  Allergies  Allergen Reactions   Sulfa Antibiotics Hives   Doxycycline Itching    itching   Gabapentin Nausea  And Vomiting   Penicillins Hives   Pravastatin     Myalgia   Rosuvastatin Other (See Comments)    Joint/muscle aches, fatigue   Zetia [Ezetimibe]     Myalgia    ROS: Pertinent items noted in HPI and remainder of comprehensive ROS otherwise negative.  HOME MEDS: Current Outpatient Medications on File Prior to Visit  Medication Sig Dispense Refill   Acetaminophen 325 MG CAPS Take by mouth.     butalbital-acetaminophen-caffeine (FIORICET) 50-325-40 MG tablet TAKE 1 TABLET BY MOUTH EVERY 4 HOURS AS NEEDED FOR SEVERE HEADACHE 30 tablet 0   cetirizine-pseudoephedrine (ZYRTEC-D) 5-120 MG tablet Take 1 tablet by mouth as needed.     cholecalciferol (VITAMIN D3) 25 MCG (1000 UNIT) tablet Take 1 tablet (1,000 Units total) by mouth 2 (two) times daily with a meal.     clobetasol ointment (TEMOVATE) 0.05 % APP THIN LAYER EXT AA BID     cycloSPORINE (RESTASIS) 0.05 % ophthalmic emulsion Apply to eye.     esomeprazole (NEXIUM) 40 MG capsule Take  1 capsule  Daily  to Prevent Laryngeal Reflux / Cough 90 capsule 3   fluticasone (FLONASE) 50 MCG/ACT nasal spray INSTILL 1-2 SPRAYS INTO BOTH NOSTRILS TWICE DAILY AS NEEDED FOR ALLERGIES 48 g 3   inclisiran (LEQVIO) 284 MG/1.5ML SOSY injection Inject 284 mg into the skin as directed. 1 dose every 3 months x2 then every 6  months thereafter     levothyroxine (SYNTHROID) 50 MCG tablet TAKE 1 AND 1/2 TABLETS BY MOUTH DAILY 150 tablet 3   meclizine (ANTIVERT) 25 MG tablet Take      1 tablet      3 x day       if needed for Dizziness /Vertigo 100 tablet 0   metroNIDAZOLE (METROGEL) 1 % gel Apply topically daily. Apply  Daily  to Acne Rosacea rash 60 g 3   naproxen sodium (ANAPROX) 220 MG tablet Take 440 mg by mouth 2 (two) times daily as needed (pain).     No current facility-administered medications on file prior to visit.    LABS/IMAGING: No results found for this or any previous visit (from the past 48 hour(s)).  No results found.  LIPID PANEL:    Component Value Date/Time   CHOL 241 (H) 02/06/2022 0901   TRIG 54 02/06/2022 0901   HDL 114 02/06/2022 0901   CHOLHDL 2.1 02/06/2022 0901   CHOLHDL 2.1 07/31/2021 0940   VLDL 13 02/04/2017 1550   LDLCALC 118 (H) 02/06/2022 0901   LDLCALC 106 (H) 07/31/2021 0940    WEIGHTS: Wt Readings from Last 3 Encounters:  07/19/22 170 lb 11.2 oz (77.4 kg)  07/18/22 171 lb 6.4 oz (77.7 kg)  07/16/22 171 lb 3.2 oz (77.7 kg)    VITALS: BP 124/80 (BP Location: Left Arm, Patient Position: Sitting, Cuff Size: Large)   Pulse 81   Ht '5\' 6"'$  (1.676 m)   Wt 170 lb 11.2 oz (77.4 kg)   SpO2 99%   BMI 27.55 kg/m   EXAM: Deferred  EKG: Deferred  ASSESSMENT: Mixed dyslipidemia, high LDL and HDL, probable genetic dyslipidemia CAC score of 43, 64th percentile (06/2019) Statin intolerance - myalgias Suboptimal lipid lowering with Repatha Elevated LP(a) of 415 nmol/L.  PLAN: 1.   Laura Maynard has started on Leqvio which she is tolerating.  She absolutely needs this medicine.  Her LP(a) was significantly elevated at 415 nmol/L.  This likely explains a significant inability to lower her cholesterol even with  Repatha.  Hopefully Leqvio will be more effective.  I would proceed with her second injection next week.  Blood pressure I think is more of a function of diet and  activity.  Follow-up with me as scheduled.  Pixie Casino, MD, [facility], [place] Director of the Advanced Lipid Disorders &  Cardiovascular Risk Reduction Clinic Diplomate of the American Board of Clinical Lipidology Attending Cardiologist  Direct Dial: [phone]  Fax: [phone]  Website:  [email or web address] 07/19/2022, 9:24 AM

## 2022-07-22 NOTE — Progress Notes (Unsigned)
Future Appointments  Date Time Provider Department  07/23/2022  4:00 PM Lucky Cowboy, MD [facility]  07/25/2022  9:00 AM [facility] PH[facility]ACIST [facility]  08/01/2022 10:30 AM Raynelle Dick, NP [facility]  08/09/2022  8:15 AM Freddie Breech, DPM [facility]  08/15/2022  8:45 AM Rennis Golden Lisette Abu, MD [facility]  01/27/2023 11:00 AM Lucky Cowboy, MD [facility]  05/28/2023  9:00 AM Carlus Pavlov, MD [facility]    History of Present Illness:      Medications  Current Outpatient Medications (Endocrine & Metabolic):    levothyroxine (SYNTHROID) 50 MCG tablet, TAKE 1 AND 1/2 TABLETS BY MOUTH DAILY  Current Outpatient Medications (Cardiovascular):    inclisiran (LEQVIO) 284 MG/1.5ML SOSY injection, Inject 284 mg into the skin as directed. 1 dose every 3 months x2 then every 6 months thereafter  Current Outpatient Medications (Respiratory):    cetirizine-pseudoephedrine (ZYRTEC-D) 5-120 MG tablet, Take 1 tablet by mouth as needed.   fluticasone (FLONASE) 50 MCG/ACT nasal spray, INSTILL 1-2 SPRAYS INTO BOTH NOSTRILS TWICE DAILY AS NEEDED FOR ALLERGIES  Current Outpatient Medications (Analgesics):    Acetaminophen 325 MG CAPS, Take by mouth.   butalbital-acetaminophen-caffeine (FIORICET) 50-325-40 MG tablet, TAKE 1 TABLET BY MOUTH EVERY 4 HOURS AS NEEDED FOR SEVERE HEADACHE   naproxen sodium (ANAPROX) 220 MG tablet, Take 440 mg by mouth 2 (two) times daily as needed (pain).   Current Outpatient Medications (Other):    cholecalciferol (VITAMIN D3) 25 MCG (1000 UNIT) tablet, Take 1 tablet (1,000 Units total) by mouth 2 (two) times daily with a meal.   clobetasol ointment (TEMOVATE) 0.05 %, APP THIN LAYER EXT AA BID   cycloSPORINE (RESTASIS) 0.05 % ophthalmic emulsion, Apply to eye.   esomeprazole (NEXIUM) 40 MG capsule, Take  1 capsule  Daily  to Prevent Laryngeal Reflux / Cough   meclizine (ANTIVERT) 25 MG tablet, Take      1 tablet      3 x day       if needed for  Dizziness /Vertigo   metroNIDAZOLE (METROGEL) 1 % gel, Apply topically daily. Apply  Daily  to Acne Rosacea rash  Problem list She has Labile hypertension; History of prediabetes; Vitamin D deficiency; Hyperlipidemia, mixed; Poor compliance with medication; Benign paroxysmal positional vertigo; TMJ (temporomandibular joint syndrome); Postablative hypothyroidism; BMI 27.0-27.9,adult; Medication Phobia; Multinodular goiter; FH: hypertension; Abnormal glucose; Statin myopathy; Migraine without aura and without status migrainosus, not intractable; Insomnia; Right sided sciatica; Sialectasis; Seasonal allergic conjunctivitis; Pseudophakia of left eye; Dry eye syndrome, bilateral; Cataract, nuclear sclerotic, left eye; Cataract cortical, senile, left; Blood in urine; Aphakia, right eye; and Osteoarthritis of knees, bilateral on their problem list.   Observations/Objective:  There were no vitals taken for this visit.  HEENT - WNL. Neck - supple.  Chest - Clear equal BS. Cor - Nl HS. RRR w/o sig MGR. PP 1(+). No edema. MS- FROM w/o deformities.  Gait Nl. Neuro -  Nl w/o focal abnormalities.   Assessment and Plan:      Follow Up Instructions:        I discussed the assessment and treatment plan with the patient. The patient was provided an opportunity to ask questions and all were answered. The patient agreed with the plan and demonstrated an understanding of the instructions.       The patient was advised to call back or seek an in-person evaluation if the symptoms worsen or if the condition fails to improve as anticipated.    Marinus Maw, MD

## 2022-07-23 ENCOUNTER — Ambulatory Visit: Payer: Medicare PPO | Admitting: Physician Assistant

## 2022-07-23 ENCOUNTER — Ambulatory Visit (INDEPENDENT_AMBULATORY_CARE_PROVIDER_SITE_OTHER): Payer: Medicare PPO | Admitting: Internal Medicine

## 2022-07-23 ENCOUNTER — Telehealth: Payer: Self-pay | Admitting: Internal Medicine

## 2022-07-23 VITALS — BP 122/70 | HR 76 | Temp 97.8°F | Resp 16 | Ht 66.0 in | Wt 170.0 lb

## 2022-07-23 DIAGNOSIS — Z79899 Other long term (current) drug therapy: Secondary | ICD-10-CM | POA: Diagnosis not present

## 2022-07-23 DIAGNOSIS — D75839 Thrombocytosis, unspecified: Secondary | ICD-10-CM | POA: Diagnosis not present

## 2022-07-23 LAB — CBC WITH DIFFERENTIAL/PLATELET
Absolute Monocytes: 626 cells/uL (ref 200–950)
Basophils Absolute: 58 cells/uL (ref 0–200)
Basophils Relative: 0.8 %
Eosinophils Absolute: 43 cells/uL (ref 15–500)
Eosinophils Relative: 0.6 %
HCT: 39.1 % (ref 35.0–45.0)
Hemoglobin: 12.5 g/dL (ref 11.7–15.5)
Lymphs Abs: 2052 cells/uL (ref 850–3900)
MCH: 27.3 pg (ref 27.0–33.0)
MCHC: 32 g/dL (ref 32.0–36.0)
MCV: 85.4 fL (ref 80.0–100.0)
MPV: 9.8 fL (ref 7.5–12.5)
Monocytes Relative: 8.7 %
Neutro Abs: 4421 cells/uL (ref 1500–7800)
Neutrophils Relative %: 61.4 %
Platelets: 441 10*3/uL — ABNORMAL HIGH (ref 140–400)
RBC: 4.58 10*6/uL (ref 3.80–5.10)
RDW: 13.2 % (ref 11.0–15.0)
Total Lymphocyte: 28.5 %
WBC: 7.2 10*3/uL (ref 3.8–10.8)

## 2022-07-23 NOTE — Progress Notes (Signed)
The patient came in for repeat blood work today. She had questions concerning her blood pressure, previous blood work and cholesterol injections. She was advised that her blood pressure was normal today. She was also made aware that if her blood work is concerning that she will be contacted and given a plan of care which will include if she should resume her cholesterol injections. The patient voiced understanding to this staff member.

## 2022-07-23 NOTE — Telephone Encounter (Signed)
Returned call to patient who states that she was told by her PCP that she had elevated Platelets. Patient would like to know if this has anything to do with her cholesterol or her leqvio and would like to know if its safe to still receive the shot as scheduled on Thursday. Advised patient that this should be fine, patient would like to hear from Dr. Priscille Loveless to ensure that this is safe.   Patient would like to know what to do about her elevated platelet levels. Advised patient to keep follow up with her PCP today at 3pm to discuss.   Patient verbalized understanding.

## 2022-07-23 NOTE — Telephone Encounter (Signed)
Patient is calling to talk with Dr. Debara Pickett or nurse in regards to PCP saying that patient has elevated platelets. Please call to discuss

## 2022-07-24 NOTE — Telephone Encounter (Signed)
Platelet count at last PCP visit was 441. Range 140-400. Appears her platelet count has typically been high normal. I do no see any information that indicated Leqvio can increase platelet count but I will defer to Dr Debara Pickett as he has more experience with this med.

## 2022-07-24 NOTE — Progress Notes (Signed)
<><><><><><><><><><><><><><><><><><><><><><><><><><><><><><><><><> <><><><><><><><><><><><><><><><><><><><><><><><><><><><><><><><><> -   Test results slightly outside the reference range are not unusual. If there is anything important, I will review this with you,  otherwise it is considered normal test values.  If you have further questions,  please do not hesitate to contact me at the office or via My Chart.  <><><><><><><><><><><><><><><><><><><><><><><><><><><><><><><><><> <><><><><><><><><><><><><><><><><><><><><><><><><><><><><><><><><>  -  CBC is OK, Platelet count is only very slightly elevated   - Doubt due to the "cholesterol shot"  - even a minor virus infection,                 like a head cold can slightly raise the platelet count                                                                                     as a normal response   - Suggest take an OTC enteric coated Baby Aspirin 81 mg daily   -  & check at Nov 2sd OV w/Dana

## 2022-07-25 ENCOUNTER — Ambulatory Visit: Payer: Medicare PPO

## 2022-07-25 ENCOUNTER — Telehealth: Payer: Self-pay | Admitting: Internal Medicine

## 2022-07-25 ENCOUNTER — Ambulatory Visit: Payer: Medicare PPO | Attending: Cardiology | Admitting: Pharmacist

## 2022-07-25 ENCOUNTER — Encounter: Payer: Self-pay | Admitting: Pharmacist

## 2022-07-25 VITALS — BP 126/82 | Wt 170.0 lb

## 2022-07-25 DIAGNOSIS — R931 Abnormal findings on diagnostic imaging of heart and coronary circulation: Secondary | ICD-10-CM

## 2022-07-25 DIAGNOSIS — E782 Mixed hyperlipidemia: Secondary | ICD-10-CM

## 2022-07-25 NOTE — Progress Notes (Deleted)
   Pharmacist Visit   Date of Encounter: 07/25/2022 ID: GALYA DUNNIGAN, DOB [birth year], MRN [id number]  PCP:  [name], [place] Providers Cardiologist:  Pixie Casino, MD { Click to update primary MD,subspecialty MD or APP then REFRESH:1}     Visit Details   VS:  There were no vitals taken for this visit. , BMI There is no height or weight on file to calculate BMI.  Wt Readings from Last 3 Encounters:  07/23/22 170 lb (77.1 kg)  07/19/22 170 lb 11.2 oz (77.4 kg)  07/18/22 171 lb 6.4 oz (77.7 kg)     Reason for visit: *** Performed today: {Blank multiple:19196::"Vitals","EKG","Provider consulted:***","Education"} Changes (medications, testing, etc.) : *** Length of Visit: *** minutes    Medications Adjustments/Labs and Tests Ordered: No orders of the defined types were placed in this encounter.  No orders of the defined types were placed in this encounter.    Alvy Bimler, [facility]  07/25/2022 9:03 AM

## 2022-07-25 NOTE — Telephone Encounter (Signed)
Pt returning call

## 2022-07-25 NOTE — Progress Notes (Signed)
   Pharmacist Visit   Date of Encounter: 07/25/2022 ID: Lamar, Naef 1945/12/06, MRN [id number]  PCP:  [name], [place] Providers Cardiologist:  Pixie Casino, MD      Visit Details   VS:  There were no vitals taken for this visit. , BMI There is no height or weight on file to calculate BMI.  Wt Readings from Last 3 Encounters:  07/23/22 170 lb (77.1 kg)  07/19/22 170 lb 11.2 oz (77.4 kg)  07/18/22 171 lb 6.4 oz (77.7 kg)     Reason for visit: Leqvio injection #2 Performed today: Vitals and Education Changes (medications, testing, etc.) : Leqvio injection given SQ on back of right arm. No adverse effects noted. Length of Visit: 15 minutes    Medications Adjustments/Labs and Tests Ordered: No orders of the defined types were placed in this encounter.  No orders of the defined types were placed in this encounter.    Alvy Bimler, [facility]  07/25/2022 11:29 AM

## 2022-07-25 NOTE — Telephone Encounter (Signed)
Left message to call back  

## 2022-07-25 NOTE — Telephone Encounter (Signed)
Spoke with patient about advice from Dr. Debara Pickett   She missed her [facility] appointment today - thought it was tomorrow. Per [place], OK to r/s to 11:30am today Patient will come to this appointment at this time.

## 2022-07-25 NOTE — Telephone Encounter (Deleted)
-----   Message from Rollen Sox, [facility] sent at 07/24/2022 12:05 PM EDT ----- Regarding: RE: co-pay for leqvio injection Honestly I have no idea if she will have a copay for her injection.  I do not have any control over that.  Ill attach Lexine Baton.  ----- Message ----- From: Fidel Levy, RN Sent: 07/19/2022   9:57 AM EDT To: Rollen Sox, RPH Subject: co-pay for leqvio injection                    Hey Gerald Stabs  This patient was in yesterday 10/19 and co-pay paid for leqvio injection but not done, d/t BP. Saw Hilty today. She is coming next week for injection -- asked if she will have another co-pay.   Please advise patient/front desk/Nikki  Thanks!

## 2022-07-25 NOTE — Telephone Encounter (Signed)
Hilty, Nadean Corwin, MD  Pavero, Harrell Gave, [place]; Bullins, Belinda Block, RN 17 hours ago (2:06 PM)    Not aware that Marion Downer can cause high platelet counts - these numbers are just out of range. Not a cause for concern.   Dr Lemmie Evens

## 2022-07-25 NOTE — Telephone Encounter (Signed)
Opened in error

## 2022-07-31 ENCOUNTER — Ambulatory Visit: Payer: Medicare PPO | Admitting: Nurse Practitioner

## 2022-07-31 ENCOUNTER — Ambulatory Visit: Payer: Medicare PPO | Admitting: Adult Health

## 2022-07-31 NOTE — Progress Notes (Unsigned)
MEDICARE ANNUAL WELLNESS VISIT AND FOLLOW UP  Assessment:    Annual Medicare Wellness Visit Due annually  Health maintenance reviewed Requested advanced directives/living will paperwork Requested covid 19 booster documenation  Essential hypertension - controlled without medication - Continue DASH diet, exercise and monitor at home. Call if greater than 130/80.   Abnormal Glucose Continue diet and exercise Monitor - A1c  Postablative hypothyroidism  follows with Dr. Darnell Level, continue med the same at this time, declines TSH today, has upcoming appointment   Hyperlipidemia -declines medications -  check lipids, decrease fatty foods, increase activity.  - saw lipid clinic, wasn't recommended PCSK9i, was recommended low risk due to high HDL - zetia intolerance, not candidate for nexlitol due to lack of vascular dx; no aortic atherosclerosis per coronary calcium CT  TMJ (temporomandibular joint syndrome) Monitor  Benign paroxysmal positional vertigo due to bilateral vestibular disorder Monitor, takes meclizine PRN, none recent   Multinodular goiter  follows with Dr. Darnell Level, continue med the same at this time  Vitamin D deficiency Continue Vit D supplementation to maintain value in therapeutic level of 60-100   Other obsessive-compulsive disorders Patient reports improved symptoms  Monitor with routine screening  Medication management Continued  Overweight, BMI 27 - long discussion about weight loss, diet, and exercise -recommended diet heavy in fruits and veggies and low in animal meats, cheeses, and dairy products  Migraines Improved; monitor  -Continue Fioricet as needed  Laceration of right hand - Continue to keep clean and dry and monitor for signs of infections - TD given today    Over 30 minutes of exam, counseling, chart review, and critical decision making was performed  Future Appointments  Date Time Provider [place]  08/09/2022 10:45 AM Bronson Ing, DPM [facility]  01/27/2023 11:00 AM [name], MD [facility] None  04/28/2023 10:00 AM Alycia Rossetti, NP [facility] None  05/28/2023  9:00 AM Philemon Kingdom, MD [facility] None    Plan:   During the course of the visit the patient was educated and counseled about appropriate screening and preventive services including:   Pneumococcal vaccine  Influenza vaccine Td vaccine Prevnar 13 Screening electrocardiogram Screening mammography Bone densitometry screening Colorectal cancer screening Diabetes screening Glaucoma screening Nutrition counseling  Advanced directives: given info/requested copies   Subjective:   Laura Maynard is a 76 y.o. female who presents for Medicare Annual Wellness Visit and 3 month follow up on hypertension, glucose management, hyperlipidemia, vitamin D def.  She is getting knee injections by Dr. Wynelle Link for bilateral knee arthritis, discussing knee replacements. She had cortisone injections 3 weeks ago.  Does not have social support to have performed now but is trying to get things lined up to do the right knee replacement first.   She does have a small cut on her right hand, uncertain of cause. Her tetanus shot is not up to date- will give today.   She has long history of migraines, recently improved. Maybe once a week currently and will have severe migraine about 3 times a year. Having more allergy related sinus headaches, taking zyrtec D, uses flonase intermittently.    BMI is Body mass index is 27.47 kg/m., she has not been working on diet, has been doing 10 min of stationary cycle daily  Wt Readings from Last 3 Encounters:  08/01/22 170 lb 3.2 oz (77.2 kg)  07/25/22 170 lb (77.1 kg)  07/23/22 170 lb (77.1 kg)    Her blood pressure has been controlled at home  without medications it has been running 120's/80's, today their BP is BP: 118/68  BP Readings from Last 3 Encounters:  08/01/22 118/68  07/25/22 126/82   07/23/22 122/70  She does not workout. She denies chest pain, shortness of breath, dizziness.    She is on cholesterol medication (currently on Leqvio, hx of myalgia with pravastatin, rosuvastatin and zetia, has seen lipid clinic, coronary calcium score of 43, 64th percentile, NO atherosclerosis, did not see benefit repatha) and denies myalgias. Her cholesterol is not at goal. 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 Cruzita Lederer. Her medication was not changed last visit.  Currently on Levothyroxine 50 mcg 11/2 tab daily Had recent check -  Lab Results  Component Value Date   TSH 2.70 06/26/2022   History of abnormal glucose, controlled with diet and exercise Lab Results  Component Value Date   HGBA1C 5.5 01/23/2022    Lab Results  Component Value Date   EGFR 73 07/16/2022    Medication Review  Current Outpatient Medications (Endocrine & Metabolic):    levothyroxine (SYNTHROID) 50 MCG tablet, TAKE 1 AND 1/2 TABLETS BY MOUTH DAILY  Current Outpatient Medications (Cardiovascular):    inclisiran (LEQVIO) 284 MG/1.5ML SOSY injection, Inject 284 mg into the skin as directed. 1 dose every 3 months x2 then every 6 months thereafter  Current Outpatient Medications (Respiratory):    fluticasone (FLONASE) 50 MCG/ACT nasal spray, INSTILL 1-2 SPRAYS INTO BOTH NOSTRILS TWICE DAILY AS NEEDED FOR ALLERGIES   cetirizine-pseudoephedrine  (ZYRTEC-D) 5-120 MG tablet, Take 1 tablet by mouth as needed. (Patient not taking: Reported on 08/01/2022)  Current Outpatient Medications (Analgesics):    Acetaminophen 325 MG CAPS, Take by mouth.   naproxen sodium (ANAPROX) 220 MG tablet, Take 440 mg by mouth 2 (two) times daily as needed (pain).   butalbital-acetaminophen-caffeine (FIORICET) 50-325-40 MG tablet, TAKE 1 TABLET BY MOUTH EVERY 4 HOURS AS NEEDED FOR SEVERE HEADACHE  Current Outpatient Medications (Hematological):    Cyanocobalamin (VITAMIN B 12 PO), Take by mouth.  Current Outpatient Medications (Other):    Biotin 1000 MCG CHEW, Chew by mouth.   cholecalciferol (VITAMIN D3) 25 MCG (1000 UNIT) tablet, Take 1 tablet (1,000 Units total) by mouth 2 (two) times daily with a meal.   clobetasol ointment (TEMOVATE) 0.05 %, APP THIN LAYER EXT AA BID   cycloSPORINE (RESTASIS) 0.05 % ophthalmic emulsion, Apply to eye.   esomeprazole (NEXIUM) 40 MG capsule, Take  1 capsule  Daily  to Prevent Laryngeal Reflux / Cough   meclizine (ANTIVERT) 25 MG tablet, Take      1 tablet      3 x day       if needed for Dizziness /Vertigo   metroNIDAZOLE (METROGEL) 1 % gel, Apply topically daily. Apply  Daily  to Acne Rosacea rash  Allergies: Allergies  Allergen Reactions  Sulfa Antibiotics Hives   Doxycycline Itching    itching   Gabapentin Nausea And Vomiting   Penicillins Hives   Pravastatin     Myalgia   Rosuvastatin Other (See Comments)    Joint/muscle aches, fatigue   Zetia [Ezetimibe]     Myalgia    Current Problems (verified) has Labile hypertension; History of prediabetes; Vitamin D deficiency; Hyperlipidemia, mixed; Poor compliance with medication; Benign paroxysmal positional vertigo; TMJ (temporomandibular joint syndrome); Postablative hypothyroidism; BMI 27.0-27.9,adult; Medication Phobia; Multinodular goiter; FH: hypertension; Abnormal glucose; Statin myopathy; Migraine without aura and without status migrainosus, not  intractable; Insomnia; Right sided sciatica; Sialectasis; Seasonal allergic conjunctivitis; Pseudophakia of left eye; Dry eye syndrome, bilateral; Cataract, nuclear sclerotic, left eye; Cataract cortical, senile, left; Blood in urine; Aphakia, right eye; and Osteoarthritis of knees, bilateral on their problem list.  Screening Tests Immunization History  Administered Date(s) Administered   Influenza Split 06/30/2013   Influenza, High Dose Seasonal PF 06/26/2015, 07/25/2016, 05/26/2017, 06/30/2018, 06/10/2019, 06/26/2020, 07/02/2021, 07/03/2022   PFIZER(Purple Top)SARS-COV-2 Vaccination 11/26/2019, 12/25/2019, 06/30/2020   Pneumococcal Conjugate-13 09/14/2014   Pneumococcal Polysaccharide-23 10/16/2011, 02/04/2017   Td 10/16/2011   Zoster, Live 01/14/2007    Preventative care: Last colonoscopy: 2012 cologuard 03/2020 negative Last mammogram: 08/2021 to have next 08/2022 DEXA:08/2021 osteopenia  Prior vaccinations: TD or Tdap: 2013  Influenza: 06/2021 Pneumococcal:  2013 Prevnar13:  2015 Shingles/Zostavax: 2008 Covid 19: 2/2, 2021, pfizer + booster   Names of Other Physician/Practitioners you currently use: 1. [facility]- here for primary care 2. Dr Alfredia Ferguson 10/2021 3. [facility], dentist, has appointment next week  Patient Care Team: [name], MD as PCP - General (Internal Medicine) Debara Pickett Nadean Corwin, MD as PCP - Cardiology (Cardiology) Vanessa Kick, MD (Dermatology) Philemon Kingdom, MD as Consulting Physician (Internal Medicine) Marcial Pacas, MD as Consulting Physician (Neurology) Gatha Mayer, MD as Consulting Physician (Gastroenterology) Rozetta Nunnery, MD (Inactive) as Consulting Physician (Otolaryngology) Gaynelle Arabian, MD as Consulting Physician (Orthopedic Surgery)  Surgical: She  has a past surgical history that includes Eye surgery (1964); Back surgery (1970); Nasal septum surgery (1998); Total abdominal  hysterectomy (2000); Knee arthroscopy (2002); Colonoscopy (2006; 03/28/11); and Cataract extraction (Left, 12/2017). Family Her family history includes COPD in her sister; Diabetes in her brother; Emphysema in her father; Lung cancer (age of onset: 2) in her sister; Stroke in her mother; Throat cancer (age of onset: 3) in her brother. Social history  She reports that she has never smoked. She has never used smokeless tobacco. She reports that she does not drink alcohol and does not use drugs.  MEDICARE WELLNESS OBJECTIVES: Physical activity: Current Exercise Habits: The patient does not participate in regular exercise at present, Exercise limited by: orthopedic condition(s) Cardiac risk factors: Cardiac Risk Factors include: advanced age (>68mn, >>62women);dyslipidemia;hypertension;sedentary lifestyle Depression/mood screen:      08/01/2022   10:54 AM  Depression screen PHQ 2/9  Decreased Interest 0  Down, Depressed, Hopeless 0  PHQ - 2 Score 0    ADLs:     08/01/2022   10:55 AM 06/15/2022    8:31 PM  In your present state of health, do you have any difficulty performing the following activities:  Hearing? 0 0  Vision? 0 0  Difficulty concentrating or making decisions? 0 0  Walking or climbing stairs? 1 0  Dressing or bathing? 0 0  Doing errands, shopping? 0 0     Cognitive Testing  Alert? Yes  Normal Appearance?Yes  Oriented to person? Yes  Place? Yes   Time? Yes  Recall of three objects?  Yes  Can perform simple calculations? Yes  Displays appropriate judgment?Yes  Can read the correct time from a watch face?Yes  EOL planning: Does Patient Have a Medical Advance Directive?: Yes Type of Advance Directive: Healthcare Power of Attorney, Living will Does patient want to make changes to medical advance directive?: No - Patient declined Copy of [place] in Chart?: No - copy requested   Objective:   Today's Vitals   08/01/22 1026  BP: 118/68  Pulse: 81   Temp: (!) 97.5 F (36.4 C)  SpO2: 95%  Weight: 170 lb 3.2 oz (77.2 kg)  Height: _0  (1.676 m)     Wt Readings from Last 3 Encounters:  08/01/22 170 lb 3.2 oz (77.2 kg)  07/25/22 170 lb (77.1 kg)  07/23/22 170 lb (77.1 kg)    Body mass index is 27.47 kg/m.  General appearance: alert, no distress, WD/WN,  female HEENT: normocephalic, sclerae anicteric, TMs pearly, nares patent, no discharge or erythema, pharynx normal. R pupil fixed (since teen following eye trauma), L round and reactive  Oral cavity: MMM, no lesions Neck: supple, no lymphadenopathy, no thyromegaly, no masses Heart: RRR, normal S1, S2, no murmurs Lungs: CTA bilaterally, no wheezes, rhonchi, or rales Abdomen: +bs, soft, non tender, non distended, no masses, no hepatomegaly, no splenomegaly Musculoskeletal: non-tender, no effusions; neg straight leg raise; R 2nd mcp joint with some bony enlargement.  Extremities: no edema, no cyanosis, no clubbing. Spider veins noted lower legs bilaterally Pulses: 2+ symmetric, upper and lower extremities, normal cap refill Neurological: alert, oriented x 3, CN2-12 intact except pupils, strength normal upper extremities and lower extremities, sensation normal throughout, DTRs 2+ throughout, no cerebellar signs, gait normal Psychiatric: normal affect, behavior normal, pleasant  Skin: several small cuts on right hand, no s/s of infection noted   Medicare Attestation I have personally reviewed: The patient's medical and social history Their use of alcohol, tobacco or illicit drugs Their current medications and supplements The patient's functional ability including ADLs,fall risks, home safety risks, cognitive, and hearing and visual impairment Diet and physical activities Evidence for depression or mood disorders  The patient's weight, height, BMI, and visual acuity have been recorded in the chart.  I have made referrals, counseling, and provided education to the patient based  on review of the above and I have provided the patient with a written personalized care plan for preventive services.     Alycia Rossetti, NP   08/01/2022

## 2022-08-01 ENCOUNTER — Ambulatory Visit (INDEPENDENT_AMBULATORY_CARE_PROVIDER_SITE_OTHER): Payer: Medicare PPO | Admitting: Nurse Practitioner

## 2022-08-01 ENCOUNTER — Encounter: Payer: Self-pay | Admitting: Nurse Practitioner

## 2022-08-01 VITALS — BP 118/82 | HR 81 | Temp 97.5°F | Ht 66.0 in | Wt 170.2 lb

## 2022-08-01 DIAGNOSIS — E782 Mixed hyperlipidemia: Secondary | ICD-10-CM

## 2022-08-01 DIAGNOSIS — Z79899 Other long term (current) drug therapy: Secondary | ICD-10-CM

## 2022-08-01 DIAGNOSIS — Z6827 Body mass index (BMI) 27.0-27.9, adult: Secondary | ICD-10-CM

## 2022-08-01 DIAGNOSIS — E890 Postprocedural hypothyroidism: Secondary | ICD-10-CM | POA: Diagnosis not present

## 2022-08-01 DIAGNOSIS — R7309 Other abnormal glucose: Secondary | ICD-10-CM | POA: Diagnosis not present

## 2022-08-01 DIAGNOSIS — Z0000 Encounter for general adult medical examination without abnormal findings: Secondary | ICD-10-CM

## 2022-08-01 DIAGNOSIS — Z23 Encounter for immunization: Secondary | ICD-10-CM | POA: Diagnosis not present

## 2022-08-01 DIAGNOSIS — Z0001 Encounter for general adult medical examination with abnormal findings: Secondary | ICD-10-CM | POA: Diagnosis not present

## 2022-08-01 DIAGNOSIS — E559 Vitamin D deficiency, unspecified: Secondary | ICD-10-CM

## 2022-08-01 DIAGNOSIS — F428 Other obsessive-compulsive disorder: Secondary | ICD-10-CM | POA: Diagnosis not present

## 2022-08-01 DIAGNOSIS — M26609 Unspecified temporomandibular joint disorder, unspecified side: Secondary | ICD-10-CM

## 2022-08-01 DIAGNOSIS — I10 Essential (primary) hypertension: Secondary | ICD-10-CM

## 2022-08-01 DIAGNOSIS — S61411A Laceration without foreign body of right hand, initial encounter: Secondary | ICD-10-CM

## 2022-08-01 DIAGNOSIS — G720 Drug-induced myopathy: Secondary | ICD-10-CM | POA: Diagnosis not present

## 2022-08-01 DIAGNOSIS — R6889 Other general symptoms and signs: Secondary | ICD-10-CM

## 2022-08-01 DIAGNOSIS — E042 Nontoxic multinodular goiter: Secondary | ICD-10-CM

## 2022-08-01 DIAGNOSIS — G43009 Migraine without aura, not intractable, without status migrainosus: Secondary | ICD-10-CM

## 2022-08-01 DIAGNOSIS — Z91148 Patient's other noncompliance with medication regimen for other reason: Secondary | ICD-10-CM

## 2022-08-01 DIAGNOSIS — G44219 Episodic tension-type headache, not intractable: Secondary | ICD-10-CM

## 2022-08-01 MED ORDER — BUTALBITAL-APAP-CAFFEINE 50-325-40 MG PO TABS: ORAL_TABLET | ORAL | 0 refills | Status: DC

## 2022-08-01 NOTE — Patient Instructions (Signed)
Monitor BP and if consistently running higher than 130/80 please notify the office  Go to the ER if any chest pain, shortness of breath, nausea, dizziness, severe HA, changes vision/speech   Hypertension, Adult Hypertension is another name for high blood pressure. High blood pressure forces your heart to work harder to pump blood. This can cause problems over time. There are two numbers in a blood pressure reading. There is a top number (systolic) over a bottom number (diastolic). It is best to have a blood pressure that is below 120/80. What are the causes? The cause of this condition is not known. Some other conditions can lead to high blood pressure. What increases the risk? Some lifestyle factors can make you more likely to develop high blood pressure: Smoking. Not getting enough exercise or physical activity. Being overweight. Having too much fat, sugar, calories, or salt (sodium) in your diet. Drinking too much alcohol. Other risk factors include: Having any of these conditions: Heart disease. Diabetes. High cholesterol. Kidney disease. Obstructive sleep apnea. Having a family history of high blood pressure and high cholesterol. Age. The risk increases with age. Stress. What are the signs or symptoms? High blood pressure may not cause symptoms. Very high blood pressure (hypertensive crisis) may cause: Headache. Fast or uneven heartbeats (palpitations). Shortness of breath. Nosebleed. Vomiting or feeling like you may vomit (nauseous). Changes in how you see. Very bad chest pain. Feeling dizzy. Seizures. How is this treated? This condition is treated by making healthy lifestyle changes, such as: Eating healthy foods. Exercising more. Drinking less alcohol. Your doctor may prescribe medicine if lifestyle changes do not help enough and if: Your top number is above 130. Your bottom number is above 80. Your personal target blood pressure may vary. Follow these  instructions at home: Eating and drinking  If told, follow the DASH eating plan. To follow this plan: Fill one half of your plate at each meal with fruits and vegetables. Fill one fourth of your plate at each meal with whole grains. Whole grains include whole-wheat pasta, brown rice, and whole-grain bread. Eat or drink low-fat dairy products, such as skim milk or low-fat yogurt. Fill one fourth of your plate at each meal with low-fat (lean) proteins. Low-fat proteins include fish, chicken without skin, eggs, beans, and tofu. Avoid fatty meat, cured and processed meat, or chicken with skin. Avoid pre-made or processed food. Limit the amount of salt in your diet to less than 1,500 mg each day. Do not drink alcohol if: Your doctor tells you not to drink. You are pregnant, may be pregnant, or are planning to become pregnant. If you drink alcohol: Limit how much you have to: 0-1 drink a day for women. 0-2 drinks a day for men. Know how much alcohol is in your drink. In the [place] one drink equals one 12 oz bottle of beer (355 mL), one 5 oz glass of wine (148 mL), or one 1 oz glass of hard liquor (44 mL). Lifestyle  Work with your doctor to stay at a healthy weight or to lose weight. Ask your doctor what the best weight is for you. Get at least 30 minutes of exercise that causes your heart to beat faster (aerobic exercise) most days of the week. This may include walking, swimming, or biking. Get at least 30 minutes of exercise that strengthens your muscles (resistance exercise) at least 3 days a week. This may include lifting weights or doing Pilates. Do not smoke or use any products that  contain nicotine or tobacco. If you need help quitting, ask your doctor. Check your blood pressure at home as told by your doctor. Keep all follow-up visits. Medicines Take over-the-counter and prescription medicines only as told by your doctor. Follow directions carefully. Do not skip doses of blood pressure  medicine. The medicine does not work as well if you skip doses. Skipping doses also puts you at risk for problems. Ask your doctor about side effects or reactions to medicines that you should watch for. Contact a doctor if: You think you are having a reaction to the medicine you are taking. You have headaches that keep coming back. You feel dizzy. You have swelling in your ankles. You have trouble with your vision. Get help right away if: You get a very bad headache. You start to feel mixed up (confused). You feel weak or numb. You feel faint. You have very bad pain in your: Chest. Belly (abdomen). You vomit more than once. You have trouble breathing. These symptoms may be an emergency. Get help right away. Call 911. Do not wait to see if the symptoms will go away. Do not drive yourself to the hospital. Summary Hypertension is another name for high blood pressure. High blood pressure forces your heart to work harder to pump blood. For most people, a normal blood pressure is less than 120/80. Making healthy choices can help lower blood pressure. If your blood pressure does not get lower with healthy choices, you may need to take medicine. This information is not intended to replace advice given to you by your health care provider. Make sure you discuss any questions you have with your health care provider. Document Revised: 07/05/2021 Document Reviewed: 07/05/2021 Elsevier Patient Education  [place].

## 2022-08-09 ENCOUNTER — Ambulatory Visit: Payer: Medicare PPO | Admitting: Podiatry

## 2022-08-09 ENCOUNTER — Ambulatory Visit: Payer: Medicare PPO | Admitting: Podiatrist

## 2022-08-15 ENCOUNTER — Ambulatory Visit: Payer: Medicare PPO | Admitting: Internal Medicine

## 2022-08-20 ENCOUNTER — Ambulatory Visit: Payer: Medicare PPO | Admitting: Podiatry

## 2022-08-20 ENCOUNTER — Encounter: Payer: Self-pay | Admitting: Podiatry

## 2022-08-20 DIAGNOSIS — M79676 Pain in unspecified toe(s): Secondary | ICD-10-CM

## 2022-08-20 DIAGNOSIS — L600 Ingrowing nail: Secondary | ICD-10-CM | POA: Diagnosis not present

## 2022-08-20 DIAGNOSIS — B351 Tinea unguium: Secondary | ICD-10-CM | POA: Diagnosis not present

## 2022-08-20 MED ORDER — NEOMYCIN-POLYMYXIN-HC 1 % OT SOLN: OTIC | 1 refills | Status: DC

## 2022-08-20 NOTE — Patient Instructions (Signed)

## 2022-08-25 NOTE — Progress Notes (Signed)
She presents today chief complaint of her painful great toe on her left foot.  States the toenail seems to be bruised and growing in.  Saw Dr. Earleen Newport he checked it she states now it is more tender she states that some of the toenail has rippled up and fallen off.  She states that she would like to have her other nails trimmed as well.  She is also concerned about her medial ankle bone being prominent.  Objective: Vital signs are stable she is alert and oriented x 3.  Pulses are palpable.  Neurologic sensorium is intact D10 reflexes are intact muscle strength is normal symmetrical bilateral.  She does have what appears to be a nail dystrophy to the hallux nails left with ingrown margins tibial and fibular border.  Exquisitely tender on palpation no purulence no malodor.  Contralateral foot does demonstrate some hypertrophic bone growth in the medial malleolus possibly secondary to trauma but does not appear to be any significant abnormality and is nontender nonpainful on range of motion or palpation.  Assessment ingrown nail paronychia abscess hallux left.  Remainder of the nails are thick yellow dystrophic clinically mycotic  Plan: Discussed etiology pathology conservative therapies with matricectomy was performed today.  Tolerated procedure well without complications.  Debrided the remainder of her nails for her today.  She was given both oral and home-going instruction for the care and soaking of her surgical foot.  She is also given prescription for Cortisporin otic to be applied twice daily after soaking.  Like to follow-up with her in 2 to 3 weeks just to make sure she is doing well.

## 2022-08-27 DIAGNOSIS — E785 Hyperlipidemia, unspecified: Secondary | ICD-10-CM | POA: Diagnosis not present

## 2022-08-28 LAB — NMR, LIPOPROFILE
Cholesterol, Total: 256 mg/dL — ABNORMAL HIGH (ref 100–199)
HDL Particle Number: 39.4 umol/L (ref >=30.5)
HDL-C: 104 mg/dL (ref >39)
LDL Particle Number: 1193 nmol/L — ABNORMAL HIGH (ref <1000)
LDL Size: 22 nm (ref >20.5)
LDL-C (NIH Calc): 144 mg/dL — ABNORMAL HIGH (ref 0–99)
LP-IR Score: <25 (ref <=45)
Small LDL Particle Number: <90 nmol/L (ref <=527)
Triglycerides: 52 mg/dL (ref 0–149)

## 2022-08-28 LAB — LIPOPROTEIN A (LPA): Lipoprotein (a): 411.9 nmol/L — ABNORMAL HIGH (ref <75.0)

## 2022-09-05 ENCOUNTER — Encounter: Payer: Self-pay | Admitting: Podiatry

## 2022-09-05 ENCOUNTER — Ambulatory Visit (INDEPENDENT_AMBULATORY_CARE_PROVIDER_SITE_OTHER): Payer: Medicare PPO | Admitting: Podiatry

## 2022-09-05 DIAGNOSIS — Z9889 Other specified postprocedural states: Secondary | ICD-10-CM

## 2022-09-05 DIAGNOSIS — L600 Ingrowing nail: Secondary | ICD-10-CM

## 2022-09-06 ENCOUNTER — Telehealth: Payer: Self-pay | Admitting: Internal Medicine

## 2022-09-06 NOTE — Telephone Encounter (Signed)
Faxed paperwork to [employer] for Leqvio at [phone]  Left message for patient that paperwork was faxed

## 2022-09-08 NOTE — Progress Notes (Signed)
She presents today for nail check hallux left.  States that she is doing pretty well.  She would like for me to smooth the ridge down.  Objective: Vital signs are stable she alert oriented x 3 surgical site appears to be healing very nicely there is no erythema edema salines drainage or odor she does have a transverse ridge across the nail plate.  Assessment: Well-healing surgical toe.  Plan: Follow-up with me.  I debrided the nail today and remove the ridge.

## 2022-10-01 ENCOUNTER — Other Ambulatory Visit: Payer: Self-pay | Admitting: Nurse Practitioner

## 2022-10-01 DIAGNOSIS — G44219 Episodic tension-type headache, not intractable: Secondary | ICD-10-CM

## 2022-10-02 ENCOUNTER — Encounter: Payer: Self-pay | Admitting: Internal Medicine

## 2022-10-02 DIAGNOSIS — Z1231 Encounter for screening mammogram for malignant neoplasm of breast: Secondary | ICD-10-CM | POA: Diagnosis not present

## 2022-10-02 LAB — HM MAMMOGRAPHY

## 2022-10-04 ENCOUNTER — Telehealth: Payer: Self-pay

## 2022-10-04 NOTE — Telephone Encounter (Signed)
[employer] patient assistance  application faxed on 33/53/31

## 2022-10-16 ENCOUNTER — Telehealth: Payer: Self-pay | Admitting: Internal Medicine

## 2022-10-16 MED ORDER — LEQVIO 284 MG/1.5ML ~~LOC~~ SOSY: PREFILLED_SYRINGE | SUBCUTANEOUS | 2 refills | Status: AC

## 2022-10-16 NOTE — Telephone Encounter (Signed)
Pt is calling in to get further information and instructions for this medication since she was approved. Requesting call back.

## 2022-10-16 NOTE — Telephone Encounter (Signed)
Rx sent to pharmacy.

## 2022-10-16 NOTE — Telephone Encounter (Signed)
Spoke with patient about Leqvio. Explained that her subsequent injections (next is due April 2024) will be at [facility] in [place]. Advised that Rx for patient assistance has been provided to [facility] pharmacy as of today and provided her phone # for infusion clinic. Informed her call to schedule this appointment will likely be closer to when she is due for injection.

## 2022-10-16 NOTE — Telephone Encounter (Signed)
Pt c/o medication issue:  1. Name of Medication: LEQVIO  2. How are you currently taking this medication (dosage and times per day)?   3. Are you having a reaction (difficulty breathing--STAT)?   4. What is your medication issue? [facility] for leqvio for pt

## 2022-10-17 NOTE — Progress Notes (Signed)
Assessment and Plan: Shaylinn was seen today for acute visit.  Diagnoses and all orders for this visit:  Essential hypertension - controlled without medication - Continue DASH diet, exercise and monitor at home. Call if greater than 130/80.   Pain in both lower extremities Will get ABI to rule out Peripheral vascular disease -     VAS [place] ABI WITH/WO TBI; Future  Leg cramps Begin Magnesium 400 mg daily Use cyclobenzaprine at bedtime for the next 7 days If develops warmth or redness in calf, pain increases go to the ER -     cyclobenzaprine (FLEXERIL) 5 MG tablet; Take 1 tablet (5 mg total) by mouth at bedtime.  Varicose veins of both lower extremities with pain - Wear compression socks daily to help prevent worsening of varicosities    Further disposition pending results of labs. Discussed med's effects and SE's.   Over 30 minutes of exam, counseling, chart review, and critical decision making was performed.   Future Appointments  Date Time Provider [place]  10/28/2022  9:00 AM Gardiner Barefoot, DPM [facility]  01/27/2023 11:00 AM [name], MD [facility] None  04/28/2023 10:00 AM Alycia Rossetti, NP [facility] None  05/28/2023  9:00 AM Philemon Kingdom, MD [facility] None    ------------------------------------------------------------------------------------------------------------------   HPI BP 124/80   Pulse 90   Temp 97.6 F (36.4 C)   Resp 16   Ht '5\' 6"'$  (1.676 m)   Wt 162 lb 6.4 oz (73.7 kg)   SpO2 98%   BMI 26.21 kg/m   77 y.o.female presents for leg cramping and burning of both legs. Burning sensation increased a week ago as well as the cramping, burning is worse at night.  She has used Tylenol and heat which does help a little.  Controlled without medication.  Denies headaches, chest pain, shortness of breath and dizziness. BP Readings from Last 3 Encounters:  10/18/22 124/80  08/01/22 118/82  07/25/22 126/82   BMI is Body mass  index is 26.21 kg/m., she has been working on diet and exercise. 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   Biotin 1000 MCG CHEW Chew by mouth.   butalbital-acetaminophen-caffeine (FIORICET) 50-325-40 MG tablet TAKE 1 TABLET BY MOUTH EVERY 4 HOURS AS NEEDED FOR SEVERE HEADACHE   cholecalciferol (VITAMIN D3) 25 MCG (1000 UNIT) tablet Take 1 tablet (1,000 Units total) by mouth 2 (two) times daily with a meal.   clobetasol ointment (TEMOVATE) 0.05 % APP  THIN LAYER EXT AA BID   Cyanocobalamin (VITAMIN B 12 PO) Take by mouth.   cycloSPORINE (RESTASIS) 0.05 % ophthalmic emulsion Apply to eye.   esomeprazole (NEXIUM) 40 MG capsule Take  1 capsule  Daily  to Prevent Laryngeal Reflux / Cough   fluticasone (FLONASE) 50 MCG/ACT nasal spray INSTILL 1-2 SPRAYS INTO BOTH NOSTRILS TWICE DAILY AS NEEDED FOR ALLERGIES   inclisiran (LEQVIO) 284 MG/1.5ML SOSY injection Inject '284mg'$  SQ at month 0 and month 3, then every 6 months   levothyroxine (SYNTHROID) 50 MCG tablet TAKE 1 AND 1/2 TABLETS BY MOUTH DAILY   meclizine (ANTIVERT) 25 MG tablet Take      1 tablet      3 x day       if needed for Dizziness  /Vertigo   metroNIDAZOLE (METROGEL) 1 % gel Apply topically daily. Apply  Daily  to Acne Rosacea rash   naproxen sodium (ANAPROX) 220 MG tablet Take 440 mg by mouth 2 (two) times daily as needed (pain).   NEOMYCIN-POLYMYXIN-HYDROCORTISONE (CORTISPORIN) 1 % SOLN OTIC solution Apply 1-2 drops to toe BID after soaking   cetirizine-pseudoephedrine (ZYRTEC-D) 5-120 MG tablet Take 1 tablet by mouth as needed. (Patient not taking: Reported on 08/01/2022)   No current facility-administered medications on file prior to visit.    ROS: all negative except above.   Physical Exam:  BP 124/80   Pulse 90   Temp 97.6 F (36.4 C)   Resp 16   Ht '5\' 6"'$  (1.676 m)   Wt 162 lb 6.4 oz (73.7 kg)   SpO2 98%   BMI 26.21 kg/m   General Appearance: Well nourished, in no apparent distress. Eyes: PERRLA, EOMs, conjunctiva no swelling or erythema Sinuses: No Frontal/maxillary tenderness ENT/Mouth: Ext aud canals clear, TMs without erythema, bulging. No erythema, swelling, or exudate on post pharynx.  Tonsils not swollen or erythematous. Hearing normal.  Neck: Supple, thyroid normal.  Respiratory: Respiratory effort normal, BS equal bilaterally without rales, rhonchi, wheezing or stridor.  Cardio: RRR with no MRGs. Brisk peripheral pulses without edema. Calf size 14.5 inches bilaterally, no redness, warmth or tenderness.  Homan sign negative bilaterally. Does have varicose veins bilaterally to lowe legs  Abdomen: Soft, + BS.  Non tender, no guarding, rebound, hernias, masses. Lymphatics: Non tender without lymphadenopathy.  Musculoskeletal: Full ROM, 5/5 strength, normal gait.  Skin: Warm, dry without rashes, lesions, ecchymosis.  Neuro: Cranial nerves intact. Normal muscle tone, no cerebellar symptoms. Sensation intact.  Psych: Awake and oriented X 3, normal affect, Insight and Judgment appropriate.     Alycia Rossetti, NP 10:33 AM [facility]

## 2022-10-18 ENCOUNTER — Encounter: Payer: Self-pay | Admitting: Nurse Practitioner

## 2022-10-18 ENCOUNTER — Ambulatory Visit: Payer: Medicare PPO | Admitting: Nurse Practitioner

## 2022-10-18 VITALS — BP 124/80 | HR 90 | Temp 97.6°F | Resp 16 | Ht 66.0 in | Wt 162.4 lb

## 2022-10-18 DIAGNOSIS — M79605 Pain in left leg: Secondary | ICD-10-CM

## 2022-10-18 DIAGNOSIS — I10 Essential (primary) hypertension: Secondary | ICD-10-CM

## 2022-10-18 DIAGNOSIS — I83813 Varicose veins of bilateral lower extremities with pain: Secondary | ICD-10-CM | POA: Diagnosis not present

## 2022-10-18 DIAGNOSIS — M79604 Pain in right leg: Secondary | ICD-10-CM

## 2022-10-18 DIAGNOSIS — R252 Cramp and spasm: Secondary | ICD-10-CM

## 2022-10-18 MED ORDER — CYCLOBENZAPRINE HCL 5 MG PO TABS: 5.0000 mg | ORAL_TABLET | Freq: Every day | ORAL | 0 refills | Status: DC

## 2022-10-18 NOTE — Patient Instructions (Addendum)
Use Magnesium 400 mg daily Use cyclobenzaprine, muscle relaxant, at bedtime for the next 7 days  An ABI has been ordered to evaluate the blood flow in your legs.  They will be calling you to schedule the appointment from [facility]   Leg Cramps Leg cramps occur when one or more muscles tighten and a person has no control over it (involuntary muscle contraction). Muscle cramps are most common in the calf muscles of the leg. They can occur during exercise or at rest. Leg cramps are painful, and they may last for a few seconds to a few minutes. Cramps may return several times before they finally stop. Usually, leg cramps are not caused by a serious medical problem. In many cases, the cause is not known. Some common causes include: Excessive physical effort (overexertion), such as during intense exercise. Doing the same motion over and over. Staying in a certain position for a long period of time. Improper preparation, form, or technique while doing a sport or an activity. Dehydration. Injury. Side effects of certain medicines. Abnormally low levels of minerals in your blood (electrolytes), especially potassium and calcium. This could result from: Pregnancy. Taking diuretic medicines. Follow these instructions at home: Eating and drinking Drink enough fluid to keep your urine pale yellow. Staying hydrated may help prevent cramps. Eat a healthy diet that includes plenty of nutrients to help your muscles function. A healthy diet includes fruits and vegetables, lean protein, whole grains, and low-fat or nonfat dairy products. Managing pain, stiffness, and swelling     Try massaging, stretching, and relaxing the affected muscle. Do this for several minutes at a time. If directed, put ice on areas that are sore or painful after a cramp. To do this: Put ice in a plastic bag. Place a towel between your skin and the bag. Leave the ice on for 20 minutes, 2-3 times a day. Remove the ice if your skin  turns bright red. This is very important. If you cannot feel pain, heat, or cold, you have a greater risk of damage to the area. If directed, apply heat to muscles that are tense or tight. Do this before you exercise, or as often as told by your health care provider. Use the heat source that your health care provider recommends, such as a moist heat pack or a heating pad. To do this: Place a towel between your skin and the heat source. Leave the heat on for 20-30 minutes. Remove the heat if your skin turns bright red. This is especially important if you are unable to feel pain, heat, or cold. You may have a greater risk of getting burned. Try taking hot showers or baths to help relax tight muscles. General instructions If you are having frequent leg cramps, avoid intense exercise for several days. Take over-the-counter and prescription medicines only as told by your health care provider. Keep all follow-up visits. This is important. Contact a health care provider if: Your leg cramps get more severe or more frequent, or they do not improve over time. Your foot becomes cold, numb, or blue. Summary Muscle cramps can develop in any muscle, but the most common place is in the calf muscles of the leg. Leg cramps are painful, and they may last for a few seconds to a few minutes. Usually, leg cramps are not caused by a serious medical problem. Often, the cause is not known. Stay hydrated, and take over-the-counter and prescription medicines only as told by your health care provider. This information  is not intended to replace advice given to you by your health care provider. Make sure you discuss any questions you have with your health care provider. Document Revised: 02/02/2020 Document Reviewed: 02/02/2020 Elsevier Patient Education  [place].

## 2022-10-22 NOTE — Telephone Encounter (Signed)
NPAF: LEQVIO Approved: 10/01/22 - 09/30/23 Id: [id number] Phone: [phone]  Shipping address has been updated to [place]

## 2022-10-23 ENCOUNTER — Ambulatory Visit (HOSPITAL_COMMUNITY)
Admission: RE | Admit: 2022-10-23 | Discharge: 2022-10-23 | Disposition: A | Discharge status: Home / Self Care | Payer: Medicare PPO | Source: Ambulatory Visit | Attending: Cardiovascular Disease | Admitting: Cardiovascular Disease

## 2022-10-23 DIAGNOSIS — M79604 Pain in right leg: Secondary | ICD-10-CM | POA: Diagnosis not present

## 2022-10-23 DIAGNOSIS — M79605 Pain in left leg: Secondary | ICD-10-CM | POA: Diagnosis not present

## 2022-10-24 LAB — VAS US LOWER EXT ART SEG MULTI (SEGMENTALS & LE RAYNAUDS)
Left ABI: 1.13
Right ABI: 1.11

## 2022-10-25 ENCOUNTER — Ambulatory Visit: Payer: Medicare PPO | Admitting: Nurse Practitioner

## 2022-10-25 ENCOUNTER — Encounter: Payer: Self-pay | Admitting: Nurse Practitioner

## 2022-10-25 VITALS — BP 122/80 | HR 90 | Temp 97.9°F | Resp 17 | Ht 66.0 in | Wt 165.6 lb

## 2022-10-25 DIAGNOSIS — I10 Essential (primary) hypertension: Secondary | ICD-10-CM | POA: Diagnosis not present

## 2022-10-25 DIAGNOSIS — R300 Dysuria: Secondary | ICD-10-CM

## 2022-10-25 MED ORDER — PHENAZOPYRIDINE HCL 200 MG PO TABS: 200.0000 mg | ORAL_TABLET | Freq: Three times a day (TID) | ORAL | 0 refills | Status: AC | PRN

## 2022-10-25 MED ORDER — NITROFURANTOIN MONOHYD MACRO 100 MG PO CAPS: 100.0000 mg | ORAL_CAPSULE | Freq: Two times a day (BID) | ORAL | 0 refills | Status: AC

## 2022-10-25 NOTE — Progress Notes (Signed)
Assessment and Plan:  Marisol was seen today for urinary tract infection.  Diagnoses and all orders for this visit:  Dysuria Push fluids If develop fever, increased abdominal pain, back pain, or nausea/vomiting please go to the ER -     nitrofurantoin, macrocrystal-monohydrate, (MACROBID) 100 MG capsule; Take 1 capsule (100 mg total) by mouth 2 (two) times daily for 7 days. -     phenazopyridine (PYRIDIUM) 200 MG tablet; Take 1 tablet (200 mg total) by mouth 3 (three) times daily as needed for up to 3 days for pain. -     Urinalysis, Routine w reflex microscopic -     Urine Culture  Essential hypertension - controlled without medication - Continue DASH diet, exercise and monitor at home. Call if greater than 130/80.   Further disposition pending results of labs. Discussed med's effects and SE's.   Over 30 minutes of exam, counseling, chart review, and critical decision making was performed.   Future Appointments  Date Time Provider [place]  01/27/2023 11:00 AM [name], MD [facility] None  04/28/2023 10:00 AM Alycia Rossetti, NP [facility] None  05/28/2023  9:00 AM Philemon Kingdom, MD [facility] None    ------------------------------------------------------------------------------------------------------------------   HPI BP 122/80   Pulse 90   Temp 97.9 F (36.6 C)   Resp 17   Ht '5\' 6"'$  (1.676 m)   Wt 165 lb 9.6 oz (75.1 kg)   SpO2 99%   BMI 26.73 kg/m   77 y.o.female presents for burning on urination, urinary frequency.  Denies hematuria and denies back pain.  Also notes a little lower abdominal pain with urination.    Controlled without medication. Denies headaches, chest pain, shortness of breath and dizziness.  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   Biotin 1000 MCG CHEW Chew by mouth.   butalbital-acetaminophen-caffeine (FIORICET) 50-325-40 MG tablet TAKE 1 TABLET BY MOUTH EVERY 4 HOURS AS NEEDED FOR SEVERE HEADACHE   cholecalciferol (VITAMIN D3) 25 MCG (1000 UNIT) tablet Take 1 tablet (1,000 Units total) by mouth 2 (two) times daily with a meal.   clobetasol ointment (TEMOVATE) 0.05 % APP THIN LAYER EXT AA BID   Cyanocobalamin (VITAMIN B 12 PO) Take by mouth.   cyclobenzaprine (FLEXERIL) 5 MG tablet Take 1 tablet (5 mg total) by mouth at bedtime.   cycloSPORINE (RESTASIS) 0.05 % ophthalmic emulsion Apply to eye.   esomeprazole (NEXIUM) 40 MG capsule Take  1 capsule  Daily  to Prevent Laryngeal Reflux / Cough   fluticasone (FLONASE) 50 MCG/ACT  nasal spray INSTILL 1-2 SPRAYS INTO BOTH NOSTRILS TWICE DAILY AS NEEDED FOR ALLERGIES   inclisiran (LEQVIO) 284 MG/1.5ML SOSY injection Inject '284mg'$  SQ at month 0 and month 3, then every 6 months   levothyroxine (SYNTHROID) 50 MCG tablet TAKE 1 AND 1/2 TABLETS BY MOUTH DAILY   meclizine (ANTIVERT) 25 MG tablet Take      1 tablet      3 x day       if needed for Dizziness /Vertigo   metroNIDAZOLE (METROGEL) 1 % gel Apply topically daily. Apply  Daily  to Acne Rosacea rash   naproxen sodium (ANAPROX) 220 MG tablet Take 440 mg by mouth 2 (two) times daily as needed (pain).   NEOMYCIN-POLYMYXIN-HYDROCORTISONE  (CORTISPORIN) 1 % SOLN OTIC solution Apply 1-2 drops to toe BID after soaking   cetirizine-pseudoephedrine (ZYRTEC-D) 5-120 MG tablet Take 1 tablet by mouth as needed. (Patient not taking: Reported on 08/01/2022)   No current facility-administered medications on file prior to visit.    ROS: all negative except above.   Physical Exam:  BP 122/80   Pulse 90   Temp 97.9 F (36.6 C)   Resp 17   Ht '5\' 6"'$  (1.676 m)   Wt 165 lb 9.6 oz (75.1 kg)   SpO2 99%   BMI 26.73 kg/m   General Appearance: Well nourished, in no apparent distress. Eyes: PERRLA, EOMs, conjunctiva no swelling or erythema Respiratory: Respiratory effort normal, BS equal bilaterally without rales, rhonchi, wheezing or stridor.  Cardio: RRR with no MRGs. Brisk peripheral pulses without edema.  Abdomen: Soft, + BS.  Non tender, no guarding, rebound, hernias, masses. Lymphatics: Non tender without lymphadenopathy.  Musculoskeletal: Full ROM, 5/5 strength, normal gait. Neg CVA tenderness Skin: Warm, dry without rashes, lesions, ecchymosis.  Neuro: Cranial nerves intact. Normal muscle tone, no cerebellar symptoms. Sensation intact.  Psych: Awake and oriented X 3, normal affect, Insight and Judgment appropriate.     Alycia Rossetti, NP 11:45 AM [facility]

## 2022-10-25 NOTE — Patient Instructions (Signed)
Urinary Tract Infection, Adult ?A urinary tract infection (UTI) is an infection of any part of the urinary tract. The urinary tract includes: ?The kidneys. ?The ureters. ?The bladder. ?The urethra. ?These organs make, store, and get rid of pee (urine) in the body. ?What are the causes? ?This infection is caused by germs (bacteria) in your genital area. These germs grow and cause swelling (inflammation) of your urinary tract. ?What increases the risk? ?The following factors may make you more likely to develop this condition: ?Using a small, thin tube (catheter) to drain pee. ?Not being able to control when you pee or poop (incontinence). ?Being female. If you are female, these things can increase the risk: ?Using these methods to prevent pregnancy: ?A medicine that kills sperm (spermicide). ?A device that blocks sperm (diaphragm). ?Having low levels of a female hormone (estrogen). ?Being pregnant. ?You are more likely to develop this condition if: ?You have genes that add to your risk. ?You are sexually active. ?You take antibiotic medicines. ?You have trouble peeing because of: ?A prostate that is bigger than normal, if you are female. ?A blockage in the part of your body that drains pee from the bladder. ?A kidney stone. ?A nerve condition that affects your bladder. ?Not getting enough to drink. ?Not peeing often enough. ?You have other conditions, such as: ?Diabetes. ?A weak disease-fighting system (immune system). ?Sickle cell disease. ?Gout. ?Injury of the spine. ?What are the signs or symptoms? ?Symptoms of this condition include: ?Needing to pee right away. ?Peeing small amounts often. ?Pain or burning when peeing. ?Blood in the pee. ?Pee that smells bad or not like normal. ?Trouble peeing. ?Pee that is cloudy. ?Fluid coming from the vagina, if you are female. ?Pain in the belly or lower back. ?Other symptoms include: ?Vomiting. ?Not feeling hungry. ?Feeling mixed up (confused). This may be the first symptom in  older adults. ?Being tired and grouchy (irritable). ?A fever. ?Watery poop (diarrhea). ?How is this treated? ?Taking antibiotic medicine. ?Taking other medicines. ?Drinking enough water. ?In some cases, you may need to see a specialist. ?Follow these instructions at home: ? ?Medicines ?Take over-the-counter and prescription medicines only as told by your doctor. ?If you were prescribed an antibiotic medicine, take it as told by your doctor. Do not stop taking it even if you start to feel better. ?General instructions ?Make sure you: ?Pee until your bladder is empty. ?Do not hold pee for a long time. ?Empty your bladder after sex. ?Wipe from front to back after peeing or pooping if you are a female. Use each tissue one time when you wipe. ?Drink enough fluid to keep your pee pale yellow. ?Keep all follow-up visits. ?Contact a doctor if: ?You do not get better after 1-2 days. ?Your symptoms go away and then come back. ?Get help right away if: ?You have very bad back pain. ?You have very bad pain in your lower belly. ?You have a fever. ?You have chills. ?You feeling like you will vomit or you vomit. ?Summary ?A urinary tract infection (UTI) is an infection of any part of the urinary tract. ?This condition is caused by germs in your genital area. ?There are many risk factors for a UTI. ?Treatment includes antibiotic medicines. ?Drink enough fluid to keep your pee pale yellow. ?This information is not intended to replace advice given to you by your health care provider. Make sure you discuss any questions you have with your health care provider. ?Document Revised: 04/28/2020 Document Reviewed: 04/28/2020 ?Elsevier Patient Education ?   [place].

## 2022-10-26 LAB — URINALYSIS, ROUTINE W REFLEX MICROSCOPIC
Bacteria, UA: NONE SEEN /HPF
Bilirubin Urine: NEGATIVE
Glucose, UA: NEGATIVE
Hyaline Cast: NONE SEEN /LPF
Ketones, ur: NEGATIVE
Nitrite: NEGATIVE
Protein, ur: NEGATIVE
Specific Gravity, Urine: 1.013 (ref 1.001–1.035)
Squamous Epithelial / HPF: NONE SEEN /HPF (ref <=5)
pH: <=5 (ref 5.0–8.0)

## 2022-10-26 LAB — URINE CULTURE
MICRO NUMBER:: 14480795
Result:: NO GROWTH
SPECIMEN QUALITY:: ADEQUATE

## 2022-10-26 LAB — MICROSCOPIC MESSAGE

## 2022-10-28 ENCOUNTER — Ambulatory Visit: Payer: Medicare PPO | Admitting: Podiatry

## 2022-11-27 DIAGNOSIS — G43709 Chronic migraine without aura, not intractable, without status migrainosus: Secondary | ICD-10-CM | POA: Diagnosis not present

## 2022-11-27 DIAGNOSIS — H04123 Dry eye syndrome of bilateral lacrimal glands: Secondary | ICD-10-CM | POA: Diagnosis not present

## 2022-11-27 DIAGNOSIS — H31011 Macula scars of posterior pole (postinflammatory) (post-traumatic), right eye: Secondary | ICD-10-CM | POA: Diagnosis not present

## 2022-11-27 DIAGNOSIS — H2701 Aphakia, right eye: Secondary | ICD-10-CM | POA: Diagnosis not present

## 2022-12-04 ENCOUNTER — Other Ambulatory Visit: Payer: Self-pay | Admitting: Internal Medicine

## 2022-12-09 ENCOUNTER — Telehealth: Payer: Self-pay | Admitting: Internal Medicine

## 2022-12-09 DIAGNOSIS — E785 Hyperlipidemia, unspecified: Secondary | ICD-10-CM

## 2022-12-09 NOTE — Telephone Encounter (Signed)
Spoke with patient about leqvio, labs, appointment.   Leqvio injection due April 2024 - she called infusion center and they will call her this week for an appointment  Labs are due about 1 week before next visit -- recall for June 2024  Scheduled patient for March 21, 2023 with Dr. Debara Pickett Will mail lab slips

## 2022-12-09 NOTE — Telephone Encounter (Signed)
NMR and LPa ordered per MD

## 2022-12-09 NOTE — Telephone Encounter (Signed)
Pt would like a callback regarding wanting to know if she'll need blood work done before her shot in April and when she'll be called for an appt date. Please advise.

## 2022-12-10 ENCOUNTER — Telehealth: Payer: Self-pay | Admitting: Pharmacy Technician

## 2022-12-10 NOTE — Telephone Encounter (Signed)
Leqvio PAP: approved Id: [id number] 10/01/22-09/30/23 Phone: [phone]  Next appt; April 2024

## 2023-01-20 ENCOUNTER — Ambulatory Visit (INDEPENDENT_AMBULATORY_CARE_PROVIDER_SITE_OTHER): Payer: Medicare PPO

## 2023-01-20 VITALS — BP 131/80 | HR 73 | Temp 98.1°F | Resp 18 | Ht 67.0 in | Wt 165.2 lb

## 2023-01-20 DIAGNOSIS — E782 Mixed hyperlipidemia: Secondary | ICD-10-CM

## 2023-01-20 MED ORDER — INCLISIRAN SODIUM 284 MG/1.5ML ~~LOC~~ SOSY
284.0000 mg | PREFILLED_SYRINGE | Freq: Once | SUBCUTANEOUS | Status: AC
  Administered 2023-01-20: 284 mg via SUBCUTANEOUS
  Filled 2023-01-20: qty 1.5

## 2023-01-20 NOTE — Progress Notes (Signed)
Diagnosis: Hyperlipidemia  Provider:  Praveen Mannam MD  Procedure: Injection  Leqvio (inclisiran), Dose: 284 mg, Site: subcutaneous, Number of injections: 1  Post Care: Patient declined observation  Discharge: Condition: Good, Destination: Home . AVS Provided  Performed by:  Angelino Rumery, RN       

## 2023-01-20 NOTE — Patient Instructions (Signed)
Inclisiran Injection What is this medication? INCLISIRAN (in kli SIR an) treats high cholesterol. It works by decreasing bad cholesterol (such as LDL) in your blood. Changes to diet and exercise are often combined with this medication. This medicine may be used for other purposes; ask your health care provider or pharmacist if you have questions. COMMON BRAND NAME(S): LEQVIO What should I tell my care team before I take this medication? They need to know if you have any of these conditions: An unusual or allergic reaction to inclisiran, other medications, foods, dyes, or preservatives Pregnant or trying to get pregnant Breast-feeding How should I use this medication? This medication is injected under the skin. It is given by your care team in a hospital or clinic setting. Talk to your care team about the use of this medication in children. Special care may be needed. Overdosage: If you think you have taken too much of this medicine contact a poison control center or emergency room at once. NOTE: This medicine is only for you. Do not share this medicine with others. What if I miss a dose? Keep appointments for follow-up doses. It is important not to miss your dose. Call your care team if you are unable to keep an appointment. What may interact with this medication? Interactions are not expected. This list may not describe all possible interactions. Give your health care provider a list of all the medicines, herbs, non-prescription drugs, or dietary supplements you use. Also tell them if you smoke, drink alcohol, or use illegal drugs. Some items may interact with your medicine. What should I watch for while using this medication? Visit your care team for regular checks on your progress. Tell your care team if your symptoms do not start to get better or if they get worse. You may need blood work while you are taking this medication. What side effects may I notice from receiving this  medication? Side effects that you should report to your care team as soon as possible: Allergic reactions--skin rash, itching, hives, swelling of the face, lips, tongue, or throat Side effects that usually do not require medical attention (report these to your care team if they continue or are bothersome): Joint pain Pain, redness, or irritation at injection site This list may not describe all possible side effects. Call your doctor for medical advice about side effects. You may report side effects to FDA at 1-800-FDA-1088. Where should I keep my medication? This medication is given in a hospital or clinic. It will not be stored at home. NOTE: This sheet is a summary. It may not cover all possible information. If you have questions about this medicine, talk to your doctor, pharmacist, or health care provider.  2023 Elsevier/Gold Standard (2020-10-04 00:00:00)  

## 2023-01-26 ENCOUNTER — Encounter: Payer: Self-pay | Admitting: Internal Medicine

## 2023-01-26 NOTE — Patient Instructions (Signed)

## 2023-01-26 NOTE — Progress Notes (Unsigned)
Annual Screening/Preventative Visit & Comprehensive Evaluation &  Examination   Future Appointments  Date Time Provider Department  01/27/2023                                cpe 11:00 AM Lucky Cowboy, MD [facility]  03/21/2023  9:45 AM Rennis Golden Lisette Abu, MD [facility]  04/28/2023                                wellness 10:00 AM Raynelle Dick, NP [facility]  05/28/2023  9:00 AM Carlus Pavlov, MD [facility]          This very nice 77 y.o. DBF with  labile HTN, HLD, T2_NIDDM  Prediabetes  and Vitamin D Deficiency  presents for a Screening / Preventative Visit & comprehensive evaluation and management of multiple medical co-morbidities.  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          Patient has been followed for labile HTN  since  2012. Patient's BP has been controlled at home and patient denies any cardiac symptoms as chest pain, palpitations, shortness of breath, dizziness or ankle swelling. Today's BP is at goal -                               .         Patient's hyperlipidemia has not been  controlled in the past due to her poor diet & reticence to take any lipid lowering meds.  Dr Rennis Golden has convinced her to take low dose Rosuvastatin.  Last lipids were  not at goal :  Lab Results  Component Value Date   CHOL 241 (H) 02/06/2022   HDL 114 02/06/2022   LDLCALC 118 (H) 02/06/2022   TRIG 54 02/06/2022   CHOLHDL 2.1 02/06/2022         Patient has hx/o prediabetes (A1c 5.7% /2012) and patient denies reactive hypoglycemic symptoms, visual blurring, diabetic polys or paresthesias. Last A1c was  normal & at goal :  Lab Results  Component Value Date   HGBA1C 5.5 01/23/2022         Finally, patient has history of Vitamin D Deficiency ("10" /2010 & "8" /2018) and  does not supplement Vit D as repeated recommended in the past.  Her  last Vitamin D was still very low :  Lab Results  Component  Value Date   VD25OH 29 (L) 01/23/2022       Current Outpatient Medications on File Prior to Visit  Medication Sig   Acetaminophen 325 MG CAPS Take by mouth.   butalbital-acetaminophen-caffeine (FIORICET) 50-325-40 MG tablet TAKE 1 TABLET BY MOUTH EVERY 4 HOURS AS NEEDED FOR SEVERE HEADACHE   cetirizine-pseudoephedrine (ZYRTEC-D) 5-120 MG tablet Take 1 tablet by mouth as needed.   cholecalciferol (VITAMIN D3) 25 MCG (1000 UNIT) tablet Take 1 tablet (1,000 Units total) by mouth 2 (two) times daily with a meal.   cycloSPORINE (RESTASIS) 0.05 % ophthalmic emulsion Restasis 0.05 % eye drops in a dropperette  INSTILL 1 DROP IN BOTH EYES TWICE DAILY   fluticasone (FLONASE) 50 MCG/ACT nasal spray INSTILL 1-2 SPRAYS INTO BOTH NOSTRILS TWICE DAILY AS NEEDED FOR ALLERGIES   levothyroxine (SYNTHROID) 50 MCG tablet Take 1.5 tablets (75 mcg  total) by mouth daily.   meclizine (ANTIVERT) 25 MG tablet Take      1 tablet      3 x day       if needed for Dizziness /Vertigo   metroNIDAZOLE (METROGEL) 1 % gel Apply topically daily. Apply  Daily  to Acne Rosacea rash   naproxen sodium (ANAPROX) 220 MG tablet Take 440 mg by mouth 2 (two) times daily as needed (pain).   rosuvastatin (CRESTOR) 20 MG tablet Take 1 tablet (20mg ) every day   No current facility-administered medications on file prior to visit.     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   High cholesterol    Hypertension    Hypothyroidism    Migraine    Prediabetes    Sarcoidosis, lung (HCC) 15 YRS AGO   NO TREATMENTS   Toxic multinodular goiter 05/27/2014   Vertigo    Vitamin D deficiency       Health Maintenance  Topic Date Due   Zoster Vaccines- Shingrix (1 of 2) Never done   COVID-19 Vaccine (4 - Booster for Pfizer series) 08/25/2020   TETANUS/TDAP  10/15/2021   INFLUENZA VACCINE  04/30/2022   MAMMOGRAM  09/11/2022   Fecal DNA (Cologuard)  04/22/2023   Pneumonia Vaccine 1+ Years old  Completed   DEXA SCAN  Completed   Hepatitis C Screening  Completed   HPV VACCINES  Aged Out     Immunization History  Administered Date(s) Administered   Influenza Split 06/30/2013   Influenza, High Dose  06/10/2019, 06/26/2020, 07/02/2021   PFIZER SARS-COV-2 Vacc 11/26/2019, 12/25/2019, 06/30/2020   Pneumococcal -13 09/14/2014   Pneumococcal -23 10/16/2011, 02/04/2017   Td 10/16/2011   Zoster, Live 01/14/2007    Last Colon - 07/15/2005 - Dr Leone Payor - recc 5 year f/u. - Sent recall letter 04/23/2016 -  Patient aware OVERDUE   Last MGM - 09/11/2021 Last dexaBMD - 09/11/2021 - Osteopenia (T-2.30)  Past Surgical History:  Procedure Laterality Date   BACK SURGERY  1970   CATARACT EXTRACTION Left 12/2017   COLONOSCOPY  2006; 03/28/11   2 small adenomas; normal   EYE SURGERY  1964   right eye   KNEE ARTHROSCOPY  2002   NASAL SEPTUM SURGERY  1998   TOTAL ABDOMINAL HYSTERECTOMY  2000     Family History  Problem Relation Age of Onset   Lung cancer Sister 54   Throat cancer Brother 81   Stroke Mother    Emphysema Father    COPD Sister    Diabetes Brother    Colon cancer Neg Hx      Social History   Tobacco Use   Smoking status: Never   Smokeless tobacco: Never  Vaping Use   Vaping Use: Never used  Substance Use Topics   Alcohol use: Never    Alcohol/week: 1.0 standard drink    Types: 1 Standard drinks or equivalent per week    Comment: RARE   Drug use: Never      ROS Constitutional: Denies fever, chills, weight loss/gain, headaches,  insomnia,  night sweats, and change in appetite. Does c/o fatigue. Eyes: Denies redness, blurred vision, diplopia,  discharge, itchy, watery eyes.  ENT: Denies discharge, congestion, post nasal drip, epistaxis, sore throat, earache, hearing loss, dental pain, Tinnitus, Vertigo, Sinus pain, snoring.  Cardio: Denies chest pain, palpitations, irregular heartbeat, syncope, dyspnea, diaphoresis, orthopnea, PND, claudication, edema Respiratory: denies cough, dyspnea, DOE, pleurisy, hoarseness, laryngitis, wheezing.  Gastrointestinal: Denies dysphagia, heartburn, reflux, water brash, pain, cramps, nausea, vomiting, bloating, diarrhea, constipation, hematemesis, melena, hematochezia, jaundice, hemorrhoids Genitourinary: Denies dysuria, frequency, urgency, nocturia, hesitancy, discharge, hematuria, flank pain Breast: Breast lumps, nipple discharge, bleeding.  Musculoskeletal: Denies arthralgia, myalgia, stiffness, Jt. Swelling, pain, limp, and strain/sprain. Denies falls. Skin: Denies puritis, rash, hives, warts, acne, eczema, changing in skin lesion Neuro: No weakness, tremor, incoordination, spasms, paresthesia, pain Psychiatric: Denies confusion, memory loss, sensory loss. Denies Depression. Endocrine: Denies change in weight, skin, hair change, nocturia, and paresthesia, diabetic polys, visual blurring, hyper / hypo glycemic episodes.  Heme/Lymph: No excessive bleeding, bruising, enlarged lymph nodes.  Physical Exam  There were no vitals taken for this visit.  General Appearance: Well nourished, well groomed and in no apparent distress.  Eyes: PERRLA, EOMs, conjunctiva no swelling or erythema, normal fundi and vessels. Sinuses: No frontal/maxillary tenderness ENT/Mouth: EACs patent / TMs  nl. Nares clear without erythema, swelling, mucoid exudates. Oral hygiene is good. No erythema, swelling, or exudate. Tongue normal, non-obstructing. Tonsils not swollen or erythematous. Hearing normal.  Neck: Supple, thyroid not palpable. No bruits, nodes or JVD. Respiratory: Respiratory effort normal.  BS equal and clear  bilateral without rales, rhonci, wheezing or stridor. Cardio: Heart sounds are normal with regular rate and rhythm and no murmurs, rubs or gallops. Peripheral pulses are normal and equal bilaterally without edema. No aortic or femoral bruits. Chest: symmetric with normal excursions and percussion. Breasts: Symmetric, without lumps, nipple discharge, retractions, or fibrocystic changes.  Abdomen: Flat, soft with bowel sounds active. Nontender, no guarding, rebound, hernias, masses, or organomegaly.  Lymphatics: Non tender without lymphadenopathy.  Genitourinary:  Musculoskeletal: Full ROM all peripheral extremities, joint stability, 5/5 strength, and normal gait. Skin: Warm and dry without rashes, lesions, cyanosis, clubbing or  ecchymosis.  Neuro: Cranial nerves intact, reflexes equal bilaterally. Normal muscle tone, no cerebellar symptoms. Sensation intact.  Pysch: Alert and oriented X 3, normal affect, Insight and Judgment appropriate.    Assessment and Plan  1. Annual Preventative Screening Examination    2. Labile hypertension  - EKG 12-Lead - Urinalysis, Routine w reflex microscopic - Microalbumin / creatinine urine ratio - CBC with Differential/Platelet - COMPLETE METABOLIC PANEL WITH GFR - Magnesium  3. Hyperlipidemia, mixed  - EKG 12-Lead  4. Abnormal glucose  - EKG 12-Lead - Hemoglobin A1c - Insulin, random  5. Vitamin D deficiency  - VITAMIN D 25 Hydroxy   6. Postablative hypothyroidism   7. Prediabetes  - Hemoglobin A1c - Insulin, random  8. Screening for colorectal cancer  - POC Hemoccult Bld/Stl   9. Poor compliance with medication   10. Screening for ischemic heart disease  - EKG 12-Lead  11. FH: hypertension  - EKG 12-Lead  12. Medication management  - Urinalysis, Routine w reflex microscopic - Microalbumin / creatinine urine ratio - CBC with Differential/Platelet - COMPLETE METABOLIC PANEL WITH GFR - Magnesium - Hemoglobin A1c -  Insulin, random - VITAMIN D 25 Hydroxy  Patient is also given sx's of Myrbetriq 25 mg & 50 mg  to try for OAB sx's.                                                 Patient was counseled in prudent diet to achieve/maintain BMI less than 25 for weight control, BP monitoring, regular exercise and medications. Discussed med's effects and SE's. Screening labs and tests as requested with regular follow-up as recommended. Over 40 minutes of exam, counseling, chart review and high complex critical decision making was performed.   Marinus Maw, MD

## 2023-01-27 ENCOUNTER — Ambulatory Visit (INDEPENDENT_AMBULATORY_CARE_PROVIDER_SITE_OTHER): Payer: Medicare PPO | Admitting: Internal Medicine

## 2023-01-27 ENCOUNTER — Encounter: Payer: Self-pay | Admitting: Internal Medicine

## 2023-01-27 VITALS — BP 120/70 | HR 83 | Temp 97.7°F | Ht 65.5 in | Wt 163.0 lb

## 2023-01-27 DIAGNOSIS — Z136 Encounter for screening for cardiovascular disorders: Secondary | ICD-10-CM | POA: Diagnosis not present

## 2023-01-27 DIAGNOSIS — E890 Postprocedural hypothyroidism: Secondary | ICD-10-CM

## 2023-01-27 DIAGNOSIS — R7303 Prediabetes: Secondary | ICD-10-CM

## 2023-01-27 DIAGNOSIS — E559 Vitamin D deficiency, unspecified: Secondary | ICD-10-CM

## 2023-01-27 DIAGNOSIS — Z0001 Encounter for general adult medical examination with abnormal findings: Secondary | ICD-10-CM

## 2023-01-27 DIAGNOSIS — R0989 Other specified symptoms and signs involving the circulatory and respiratory systems: Secondary | ICD-10-CM

## 2023-01-27 DIAGNOSIS — Z79899 Other long term (current) drug therapy: Secondary | ICD-10-CM | POA: Diagnosis not present

## 2023-01-27 DIAGNOSIS — E782 Mixed hyperlipidemia: Secondary | ICD-10-CM | POA: Diagnosis not present

## 2023-01-27 DIAGNOSIS — G44219 Episodic tension-type headache, not intractable: Secondary | ICD-10-CM

## 2023-01-27 DIAGNOSIS — Z8249 Family history of ischemic heart disease and other diseases of the circulatory system: Secondary | ICD-10-CM

## 2023-01-27 DIAGNOSIS — Z91148 Patient's other noncompliance with medication regimen for other reason: Secondary | ICD-10-CM

## 2023-01-27 DIAGNOSIS — Z1211 Encounter for screening for malignant neoplasm of colon: Secondary | ICD-10-CM

## 2023-01-27 DIAGNOSIS — R7309 Other abnormal glucose: Secondary | ICD-10-CM

## 2023-01-27 DIAGNOSIS — Z0000 Encounter for general adult medical examination without abnormal findings: Secondary | ICD-10-CM | POA: Diagnosis not present

## 2023-01-27 LAB — CBC WITH DIFFERENTIAL/PLATELET
Absolute Monocytes: 428 cells/uL (ref 200–950)
Eosinophils Absolute: 71 cells/uL (ref 15–500)
Hemoglobin: 12 g/dL (ref 11.7–15.5)
MCHC: 31.9 g/dL — ABNORMAL LOW (ref 32.0–36.0)
RBC: 4.51 10*6/uL (ref 3.80–5.10)

## 2023-01-27 MED ORDER — NEOMYCIN-POLYMYXIN-HC 1 % OT SOLN: OTIC | 1 refills | Status: DC

## 2023-01-27 MED ORDER — BUTALBITAL-APAP-CAFFEINE 50-325-40 MG PO TABS: ORAL_TABLET | ORAL | 0 refills | Status: DC

## 2023-01-27 MED ORDER — NEOMYCIN-POLYMYXIN-HC 1 % OT SOLN: OTIC | 3 refills | Status: DC

## 2023-01-27 MED ORDER — METRONIDAZOLE 1 % EX GEL: Freq: Every day | CUTANEOUS | 3 refills | Status: DC

## 2023-01-28 ENCOUNTER — Other Ambulatory Visit: Payer: Self-pay | Admitting: Internal Medicine

## 2023-01-28 LAB — URINALYSIS, ROUTINE W REFLEX MICROSCOPIC
Bilirubin Urine: NEGATIVE
Glucose, UA: NEGATIVE
Hyaline Cast: NONE SEEN /LPF
Ketones, ur: NEGATIVE
Leukocytes,Ua: NEGATIVE
Nitrite: NEGATIVE
Protein, ur: NEGATIVE
Specific Gravity, Urine: 1.016 (ref 1.001–1.035)
Squamous Epithelial / HPF: NONE SEEN /HPF (ref <=5)
pH: <=5 (ref 5.0–8.0)

## 2023-01-28 LAB — COMPLETE METABOLIC PANEL WITH GFR
AG Ratio: 1.9 (calc) (ref 1.0–2.5)
ALT: 13 U/L (ref 6–29)
AST: 26 U/L (ref 10–35)
Albumin: 4.3 g/dL (ref 3.6–5.1)
Alkaline phosphatase (APISO): 115 U/L (ref 37–153)
BUN: 9 mg/dL (ref 7–25)
CO2: 29 mmol/L (ref 20–32)
Calcium: 9.5 mg/dL (ref 8.6–10.4)
Chloride: 106 mmol/L (ref 98–110)
Creat: 0.73 mg/dL (ref 0.60–1.00)
Globulin: 2.3 g/dL (calc) (ref 1.9–3.7)
Glucose, Bld: 92 mg/dL (ref 65–99)
Potassium: 4.3 mmol/L (ref 3.5–5.3)
Sodium: 142 mmol/L (ref 135–146)
Total Bilirubin: 0.5 mg/dL (ref 0.2–1.2)
Total Protein: 6.6 g/dL (ref 6.1–8.1)
eGFR: 85 mL/min/{1.73_m2} (ref >=60)

## 2023-01-28 LAB — CBC WITH DIFFERENTIAL/PLATELET
Basophils Absolute: 61 cells/uL (ref 0–200)
Basophils Relative: 1.3 %
Eosinophils Relative: 1.5 %
HCT: 37.6 % (ref 35.0–45.0)
Lymphs Abs: 1824 cells/uL (ref 850–3900)
MCH: 26.6 pg — ABNORMAL LOW (ref 27.0–33.0)
MCV: 83.4 fL (ref 80.0–100.0)
MPV: 9.6 fL (ref 7.5–12.5)
Monocytes Relative: 9.1 %
Neutro Abs: 2317 cells/uL (ref 1500–7800)
Neutrophils Relative %: 49.3 %
Platelets: 419 10*3/uL — ABNORMAL HIGH (ref 140–400)
RDW: 12.9 % (ref 11.0–15.0)
Total Lymphocyte: 38.8 %
WBC: 4.7 10*3/uL (ref 3.8–10.8)

## 2023-01-28 LAB — HEMOGLOBIN A1C
Hgb A1c MFr Bld: 5.5 %{Hb}
Mean Plasma Glucose: 111 mg/dL
eAG (mmol/L): 6.2 mmol/L

## 2023-01-28 LAB — MICROALBUMIN / CREATININE URINE RATIO
Creatinine, Urine: 112 mg/dL (ref 20–275)
Microalb Creat Ratio: 5 mg/g creat (ref <30)
Microalb, Ur: 0.6 mg/dL

## 2023-01-28 LAB — TSH: TSH: 1.62 mIU/L (ref 0.40–4.50)

## 2023-01-28 LAB — INSULIN, RANDOM: Insulin: 8 u[IU]/mL

## 2023-01-28 LAB — MICROSCOPIC MESSAGE

## 2023-01-28 LAB — VITAMIN D 25 HYDROXY (VIT D DEFICIENCY, FRACTURES): Vit D, 25-Hydroxy: 34 ng/mL (ref 30–100)

## 2023-01-28 LAB — MAGNESIUM: Magnesium: 2.2 mg/dL (ref 1.5–2.5)

## 2023-01-28 MED ORDER — NEOMYCIN-POLYMYXIN-HC 1 % OT SOLN: OTIC | 3 refills | Status: AC

## 2023-01-28 NOTE — Progress Notes (Signed)
^<^<^<^<^<^<^<^<^<^<^<^<^<^<^<^<^<^<^<^<^<^<^<^<^<^<^<^<^<^<^<^<^<^<^<^<^ ^>^>^>^>^>^>^>^>^>^>^>>^>^>^>^>^>^>^>^>^>^>^>^>^>^>^>^>^>^>^>^>^>^>^>^>^>  -    Thyroid is Normal   ^<^<^<^<^<^<^<^<^<^<^<^<^<^<^<^<^<^<^<^<^<^<^<^<^<^<^<^<^<^<^<^<^<^<^<^<^  - A1c - Normal - No Diabetes   ^>^>^>^>^>^>^>^>^>^>^>^>^>^>^>^>^>^>^>^>^>^>^>^>^>^>^>^>^>^>^>^>^>^>^>^>^  -  Vitamin D = 34 - STILL EXTREMELY LOW                    - SUGGESTS you are not taking as repeated ly recommended in tre past !                    - Recommend   take Vit D 5,000 units / day !   ^>^>^>^>^>^>^>^>^>^>^>^>^>^>^>^>^>^>^>^>^>^>^>^>^>^>^>^>^>^>^>^>^>^>^>^>^  - Also STRONGLY Recommend  STOP taking Biotin                       as there have been Death reported with patients taking too much Biotin !  ^>^>^>^>^>^>^>^>^>^>^>^>^>^>^>^>^>^>^>^>^>^>^>^>^>^>^>^>^>^>^>^>^>^>^>^>^  -All Else - CBC - Kidneys - Electrolytes - Liver - Magnesium  - all  Normal / OK  ^>^>^>^>^>^>^>^>^>^>^>^>^>^>^>^>^>^>^>^>^>^>^>^>^>^>^>^>^>^>^>^>^>^>^>^>^ ^>^>^>^>^>^>^>^>^>^>^>^>^>^>^>^>^>^>^>^>^>^>^>^>^>^>^>^>^>^>^>^>^>^>^>^>^

## 2023-01-30 NOTE — Progress Notes (Signed)
Patient is aware of lab results and instructions and would like a copy mailed to her. Will you mail her a copy, I am unable to print lab work. -e welch

## 2023-01-31 ENCOUNTER — Encounter: Payer: Self-pay | Admitting: Internal Medicine

## 2023-02-17 ENCOUNTER — Encounter: Payer: Self-pay | Admitting: Nurse Practitioner

## 2023-02-17 ENCOUNTER — Ambulatory Visit: Payer: Medicare PPO | Admitting: Nurse Practitioner

## 2023-02-17 VITALS — BP 130/68 | HR 87 | Temp 97.9°F | Ht 65.5 in | Wt 163.2 lb

## 2023-02-17 DIAGNOSIS — H60331 Swimmer's ear, right ear: Secondary | ICD-10-CM | POA: Diagnosis not present

## 2023-02-17 DIAGNOSIS — H9201 Otalgia, right ear: Secondary | ICD-10-CM | POA: Diagnosis not present

## 2023-02-17 MED ORDER — PREDNISONE 10 MG PO TABS
ORAL_TABLET | ORAL | 0 refills | Status: DC
Start: 2023-02-17 — End: 2023-04-28

## 2023-02-17 NOTE — Patient Instructions (Signed)
Otitis Externa  Otitis externa is an infection of the outer ear canal. The outer ear canal is the area between the outside of the ear and the eardrum. Otitis externa is sometimes called swimmer's ear. What are the causes? Common causes of this condition include: Swimming in dirty water. Moisture in the ear. An injury to the inside of the ear. An object stuck in the ear. A cut or scrape on the outside of the ear or in the ear canal. What increases the risk? You are more likely to get this condition if you go swimming often. What are the signs or symptoms? Itching in the ear. This is often the first symptom. Swelling of the ear. Redness in the ear. Ear pain. The pain may get worse when you pull on your ear. Pus coming from the ear. How is this treated? This condition may be treated with: Antibiotic ear drops. These are often given for 10-14 days. Medicines to reduce itching and swelling. Follow these instructions at home: If you were prescribed antibiotic ear drops, use them as told by your doctor. Do not stop using them even if you start to feel better. Take over-the-counter and prescription medicines only as told by your doctor. Avoid getting water in your ears as told by your doctor. You may be told to avoid swimming or water sports for a few days. Keep all follow-up visits. How is this prevented? Keep your ears dry. Use the corner of a towel to dry your ears after you swim or bathe. Try not to scratch or put things in your ear. Doing these things makes it easier for germs to grow in your ear. Avoid swimming in lakes, dirty water, or swimming pools that may not have the right amount of a chemical called chlorine. Contact a doctor if: You have a fever. Your ear is still red, swollen, or painful after 3 days. You still have pus coming from your ear after 3 days. Your redness, swelling, or pain gets worse. You have a very bad headache. Get help right away if: You have redness,  swelling, and pain or tenderness behind your ear. Summary Otitis externa is an infection of the outer ear canal. Symptoms include pain, redness, and swelling of the ear. If you were prescribed antibiotic ear drops, use them as told by your doctor. Do not stop using them even if you start to feel better. Try not to scratch or put things in your ear. This information is not intended to replace advice given to you by your health care provider. Make sure you discuss any questions you have with your health care provider. Document Revised: 11/29/2020 Document Reviewed: 11/29/2020 Elsevier Patient Education  2023 [employer]  

## 2023-02-17 NOTE — Progress Notes (Signed)
Assessment and Plan:  Laura Maynard was seen today for an episodic visit.  Diagnoses and all order for this visit:  1. Acute swimmer's ear of right side Continue to keep ear canal free of moisture May apply topical hydrocortisone 1% cream to outer canal Continue to monitor  - predniSONE (DELTASONE) 10 MG tablet; 1 tab 3 x day for 2 days, then 1 tab 2 x day for 2 days, then 1 tab 1 x day for 3 days  Dispense: 13 tablet; Refill: 0  2. Acute otalgia, right Start prednisone taper as directed for pain. If s/s fail to improve or advance to sinus infection may need further tmt with abx.  - predniSONE (DELTASONE) 10 MG tablet; 1 tab 3 x day for 2 days, then 1 tab 2 x day for 2 days, then 1 tab 1 x day for 3 days  Dispense: 13 tablet; Refill: 0  Notify office for further evaluation and treatment, questions or concerns if s/s fail to improve. The risks and benefits of my recommendations, as well as other treatment options were discussed with the patient today. Questions were answered.  Further disposition pending results of labs. Discussed med's effects and SE's.    Over 15 minutes of exam, counseling, chart review, and critical decision making was performed.   Future Appointments  Date Time Provider Department Center  02/17/2023 11:00 AM Adela Glimpse, NP [facility] None  03/21/2023  9:45 AM Rennis Golden Lisette Abu, MD [facility] None  04/28/2023 10:00 AM Raynelle Dick, NP [facility] None  05/28/2023  9:00 AM Carlus Pavlov, MD [facility] None  07/22/2023  8:30 AM [facility] None  07/30/2023 10:30 AM Lucky Cowboy, MD [facility] None  02/04/2024 10:00 AM Lucky Cowboy, MD [facility] None    ------------------------------------------------------------------------------------------------------------------   HPI BP 130/68   Pulse 87   Temp 97.9 F (36.6 C)   Ht 5' 5.5" (1.664 m)   Wt 163 lb 3.2 oz (74 kg)   SpO2 99%   BMI 26.74 kg/m   Patient complains of right  ear pain. Symptoms have been present 3 days. She also notes ringing in the right ear and pressure along the right neck . She does have a history of ear infections. She does not have a history of recent swimming. She has tried  Aleve  for her symptoms. She has been recently treated with abx.  She is trying to stay well hydrated.  She has a hx of seasonal allergies and migraine.  Does note mild HA that is described as pressure.  She takes Fioricet and Acetaminophen with some improvement.   Denies fever, chills.    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   Biotin 1000 MCG CHEW Chew by mouth.   butalbital-acetaminophen-caffeine (FIORICET) 50-325-40 MG tablet TAKE 1 TABLET  EVERY 4 HOURS AS NEEDED FOR SEVERE HEADACHE                                                                               /                                      TAKE                                                   BY                                        MOUTH   cholecalciferol (VITAMIN D3) 25 MCG (1000 UNIT) tablet Take 1 tablet (1,000 Units total) by mouth 2 (two) times daily with a meal.   Cyanocobalamin (VITAMIN B 12 PO) Take by mouth.   cycloSPORINE (RESTASIS) 0.05 % ophthalmic emulsion Apply to eye.   fluticasone (FLONASE) 50 MCG/ACT nasal spray INSTILL  1-2 SPRAYS INTO BOTH NOSTRILS TWICE DAILY AS NEEDED FOR ALLERGIES   inclisiran (LEQVIO) 284 MG/1.5ML SOSY injection Inject 284mg  SQ at month 0 and month 3, then every 6 months   levothyroxine (SYNTHROID) 50 MCG tablet TAKE 1 AND 1/2 TABLETS BY MOUTH DAILY   meclizine (ANTIVERT) 25 MG tablet Take      1 tablet      3 x day       if needed for Dizziness /Vertigo   metroNIDAZOLE (METROGEL) 1 % gel Apply topically daily. Apply  Daily  to Acne Rosacea rash   naproxen sodium (ANAPROX) 220 MG tablet Take 440 mg by mouth 2 (two) times daily as needed (pain).   NEOMYCIN-POLYMYXIN-HYDROCORTISONE (CORTISPORIN) 1 % SOLN OTIC solution Apply  6 to 8 drops to affected ear 3 to 4 x /day   No current facility-administered medications on file prior to visit.    ROS: all negative except what is noted in the HPI.   Physical Exam:  BP 130/68   Pulse 87   Temp 97.9 F (36.6 C)   Ht 5' 5.5" (1.664 m)   Wt 163 lb 3.2 oz (74 kg)   SpO2 99%   BMI 26.74 kg/m   General Appearance: NAD.  Awake, conversant and cooperative. Eyes: PERRLA, EOMs intact.  Sclera white.  Conjunctiva without erythema. Sinuses: No frontal/maxillary tenderness.  No nasal discharge. Nares patent.  ENT/Mouth: Right ext aud canals with mild erythema, tender to palpation.  Bilateral TMs w/DOL and without erythema or bulging. Hearing intact.  Posterior pharynx without swelling or exudate.  Tonsils without swelling or erythema.  Neck: Supple.  No masses, nodules or thyromegaly. Respiratory: Effort is regular with non-labored breathing. Breath sounds are equal bilaterally without rales, rhonchi, wheezing or  stridor.  Cardio: RRR with no MRGs. Brisk peripheral pulses without edema.  Abdomen: Active BS in all four quadrants.  Soft and non-tender without guarding, rebound tenderness, hernias or masses. Lymphatics: Non tender without lymphadenopathy.  Musculoskeletal: Full ROM, 5/5 strength, normal ambulation.  No clubbing or cyanosis. Skin:  Appropriate color for ethnicity. Warm without rashes, lesions, ecchymosis, ulcers.  Neuro: CN II-XII grossly normal. Normal muscle tone without cerebellar symptoms and intact sensation.   Psych: AO X 3,  appropriate mood and affect, insight and judgment.     Adela Glimpse, NP 10:59 AM [facility]

## 2023-03-10 ENCOUNTER — Other Ambulatory Visit: Payer: Self-pay

## 2023-03-10 DIAGNOSIS — Z1212 Encounter for screening for malignant neoplasm of rectum: Secondary | ICD-10-CM | POA: Diagnosis not present

## 2023-03-10 DIAGNOSIS — Z1211 Encounter for screening for malignant neoplasm of colon: Secondary | ICD-10-CM

## 2023-03-10 LAB — POC HEMOCCULT BLD/STL (HOME/3-CARD/SCREEN)
Card #2 Fecal Occult Blod, POC: NEGATIVE
Card #3 Fecal Occult Blood, POC: NEGATIVE
Fecal Occult Blood, POC: NEGATIVE

## 2023-03-14 DIAGNOSIS — E785 Hyperlipidemia, unspecified: Secondary | ICD-10-CM | POA: Diagnosis not present

## 2023-03-17 LAB — NMR, LIPOPROFILE
Cholesterol, Total: 223 mg/dL — ABNORMAL HIGH (ref 100–199)
HDL Particle Number: 34 umol/L (ref >=30.5)
HDL-C: 105 mg/dL (ref >39)
LDL Particle Number: 843 nmol/L (ref <1000)
LDL Size: 21.6 nm (ref >20.5)
LDL-C (NIH Calc): 105 mg/dL — ABNORMAL HIGH (ref 0–99)
LP-IR Score: <25 (ref <=45)
Small LDL Particle Number: <90 nmol/L (ref <=527)
Triglycerides: 75 mg/dL (ref 0–149)

## 2023-03-17 LAB — LIPOPROTEIN A (LPA): Lipoprotein (a): 259.9 nmol/L — ABNORMAL HIGH (ref <75.0)

## 2023-03-21 ENCOUNTER — Encounter: Payer: Self-pay | Admitting: Internal Medicine

## 2023-03-21 ENCOUNTER — Ambulatory Visit: Payer: Medicare PPO | Attending: Internal Medicine | Admitting: Internal Medicine

## 2023-03-21 VITALS — BP 134/64 | HR 84 | Ht 66.0 in | Wt 161.8 lb

## 2023-03-21 DIAGNOSIS — T466X5D Adverse effect of antihyperlipidemic and antiarteriosclerotic drugs, subsequent encounter: Secondary | ICD-10-CM

## 2023-03-21 DIAGNOSIS — E7841 Elevated Lipoprotein(a): Secondary | ICD-10-CM | POA: Diagnosis not present

## 2023-03-21 DIAGNOSIS — T466X5A Adverse effect of antihyperlipidemic and antiarteriosclerotic drugs, initial encounter: Secondary | ICD-10-CM

## 2023-03-21 DIAGNOSIS — R931 Abnormal findings on diagnostic imaging of heart and coronary circulation: Secondary | ICD-10-CM

## 2023-03-21 DIAGNOSIS — E785 Hyperlipidemia, unspecified: Secondary | ICD-10-CM | POA: Diagnosis not present

## 2023-03-21 DIAGNOSIS — M791 Myalgia, unspecified site: Secondary | ICD-10-CM | POA: Diagnosis not present

## 2023-03-21 NOTE — Progress Notes (Signed)
LIPID CLINIC CONSULT NOTE  Chief Complaint:  Follow-up  Primary Care Physician: Laura Cowboy, MD   Primary Cardiologist:  Laura Nose, MD  HPI:  Laura Maynard is a 77 y.o. female who is being seen today for the evaluation of dyslipidemia at the request of Laura Cowboy, MD. This is a pleasant 77 year old female who is kindly referred by Dr. Oneta Maynard for evaluation of dyslipidemia.  According to her she has had a longstanding history of dyslipidemia and is been on multiple therapies including the statins (pravastatin, atorvastatin (and ezetimibe, for which she has had significant side effects including myalgias.  Recently she had a lipid profile which showed total cholesterol 269, triglycerides 57, HDL 99 and LDL 155.  I reviewed her chart and noted that she has no history of coronary disease and few cardiovascular risk factors other than age.  She has no history of hypertension, diabetes and no significant premature coronary disease in her family.  She does have hypothyroidism on low-dose levothyroxine.  Chart review indicated she had a CT angios in 2014 which showed no evidence of pulmonary embolism or pulmonary sarcoidosis.  Also there was no mention of coronary calcification.  08/31/2020  Laura Maynard is seen today in follow-up.  Repeat lipids show persistent elevation with total cholesterol 269, triglycerides 102, HDL 53 and LDL 152.  She has not been on therapy for this.  A CT calcium score did show some coronary calcium with a score 43, 64th percentile for age and sex matched control.  Small area of calcification noted in the distal circumflex artery.  On these findings I would recommend therapy.  I had recommended trying bempedoic acid however the cost would have been $68 a month.  This is felt to be too expensive for her.  We then talked about several other options.  One might be a very low-dose of a statin such as rosuvastatin which is very potent.  She seemed agreeable to  this.  12/07/2020  Laura Maynard is seen today in follow-up.  She has been on low-dose Crestor 5 mg every other day however has significant arthralgias/myalgias which are likely related to the rosuvastatin.  She had similar symptoms with pravastatin and ezetimibe in the past.  I do think we will need to consider an alternative.  In the past we had prescribed bempedoic acid and she was approved for that however the cost was an issue.  I would like to revisit that and she may now be eligible for a grant from [employer].  Her recent cholesterol showed total 247, triglycerides 50, HDL 118 and LDL 121.  06/12/2021  Laura Maynard is seen today in follow-up.  She reports she has been compliant with the Repatha although her labs 2 weeks ago showed no significant change.  Her total cholesterol still 240 with LDL 121.  This is quite unusual.  She did describe to me how she injected the medication which sounds correct.  There are few explanations as to why she may not have had improvement in this.  One may be that she has a high proportion of LP(a), the other could be a mutation in PCSK9 however that would be unusual given the fact that her cholesterol is really not all that high however does need to be lower based on the fact she has coronary calcium.  We discussed several other options today.  I propose going to atorvastatin however she was deathly afraid of that based on her reading on the  Internet.  She said she would prefer to go back to low-dose rosuvastatin even though it may have caused her side effects.  She is not really prepared to do injections every 2 weeks indefinitely as she thought the medication would only be necessary for short period of time.  10/03/2021  Laura Maynard returns today for follow-up.  Unfortunately her lipids were not significantly improved from prior studies.  Cholesterol now total is 232, triglycerides 67, HDL 104 and LDL 117.  Her labs from August showed total cholesterol 240, triglycerides  50, HDL 111 and LDL 121.  She reports compliance with rosuvastatin 5 mg every other day and is not having any side effects.  02/15/2022  Laura Maynard returns today for follow-up.  She reports strict compliance with the increased dose of rosuvastatin 20 mg daily.  Unfortunately there is been no significant change in her lipids.  She is quite frustrated in fact was tearful about this today.  Interestingly she had almost no significant improvement with other statins in the past and was also not able to tolerate Repatha although did have some improvement in her lipids.  Labs now show total cholesterol 241, triglycerides 54, HDL 114 and LDL 118, similar to prior testing which showed total cholesterol 232 and LDL 117.  07/19/2022  Laura Maynard returns today for follow-up of her lipids.  She recently was seen for her second injection of Leqvio.  This was postponed due to concern about hypertension.  She had initially seen her primary care provider because of some leg swelling and was noted to be a little hypertensive.  They had indicated that they felt it might be related to [facility] and then she was seen by our pharmacist who decided to postpone her shot and have her follow-up with me.  Blood pressure today however is normal.  When I asked her about her diet she says she has been eating more processed foods and sodium recently which I suspect may be related to her elevated blood pressure.  It is highly unlikely to be related to her Leqvio.  03/21/2023  Laura Maynard is seen today in follow-up.  She states to be tolerating Leqvio.  She has had further improvement in her lipids and only mild injection site redness in her abdomen however tolerated the arm injection better.  She has had a marked reduction in her LDL particle number for 1193 down to 843, LDL-C has reduced from 144 down to 105 and moreover her LP(a) is come down from 411.9 nmol/L to 259 nmol/L.  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   High cholesterol    Hypertension    Hypothyroidism    Migraine    Prediabetes    Sarcoidosis, lung (HCC) 15 YRS AGO   NO TREATMENTS   Toxic multinodular goiter 05/27/2014   Vertigo    Vitamin D deficiency     Past Surgical History:  Procedure Laterality Date   BACK SURGERY  1970   CATARACT EXTRACTION Left 12/2017   COLONOSCOPY  2006; 03/28/11   2 small adenomas; normal   EYE SURGERY  1964   right eye   KNEE ARTHROSCOPY  2002   NASAL SEPTUM SURGERY  1998   TOTAL ABDOMINAL HYSTERECTOMY  2000    FAMHx:  Family History  Problem Relation Age of Onset   Lung cancer Sister 92   Throat cancer Brother 48   Stroke  Mother    Emphysema Father    COPD Sister    Diabetes Brother    Colon cancer Neg Hx     SOCHx:   reports that she has never smoked. She has never used smokeless tobacco. She reports that she does not drink alcohol and does not use drugs.  ALLERGIES:  Allergies  Allergen Reactions   Sulfa Antibiotics Hives   Doxycycline Itching    itching   Gabapentin Nausea And Vomiting   Penicillins Hives   Pravastatin     Myalgia   Rosuvastatin Other (See Comments)    Joint/muscle aches, fatigue   Zetia [Ezetimibe]     Myalgia    ROS: Pertinent items noted in HPI and remainder of comprehensive ROS otherwise negative.  HOME MEDS: Current Outpatient Medications on File Prior to Visit  Medication Sig Dispense Refill   Acetaminophen 325 MG CAPS Take by mouth.     butalbital-acetaminophen-caffeine (FIORICET) 50-325-40 MG tablet TAKE 1 TABLET  EVERY 4 HOURS AS NEEDED FOR SEVERE HEADACHE                                                                               /                                      TAKE                                                   BY                                        MOUTH 30 tablet 0   cholecalciferol (VITAMIN D3) 25 MCG (1000 UNIT) tablet Take 1 tablet (1,000 Units total) by mouth 2  (two) times daily with a meal.     Cyanocobalamin (VITAMIN B 12 PO) Take by mouth.     cycloSPORINE (RESTASIS) 0.05 % ophthalmic emulsion Apply to eye.     inclisiran (LEQVIO) 284 MG/1.5ML SOSY injection Inject 284mg  SQ at month 0 and month 3, then every 6 months 1.5 mL 2   levothyroxine (SYNTHROID) 50 MCG tablet TAKE 1 AND 1/2 TABLETS BY MOUTH DAILY 150 tablet 3   meclizine (ANTIVERT) 25 MG tablet Take      1 tablet      3 x day       if needed for Dizziness /Vertigo 100 tablet 0   metroNIDAZOLE (METROGEL) 1 % gel Apply topically daily. Apply  Daily  to Acne Rosacea rash 60 g 3   naproxen sodium (ANAPROX) 220 MG tablet Take 440 mg by mouth 2 (two) times daily as needed (pain).     NEOMYCIN-POLYMYXIN-HYDROCORTISONE (CORTISPORIN) 1 % SOLN OTIC solution Apply  6 to 8 drops to affected ear 3 to 4 x /day 10 mL 3   Biotin 1000 MCG CHEW Chew by mouth. (Patient not taking:  Reported on 03/21/2023)     fluticasone (FLONASE) 50 MCG/ACT nasal spray INSTILL 1-2 SPRAYS INTO BOTH NOSTRILS TWICE DAILY AS NEEDED FOR ALLERGIES (Patient not taking: Reported on 03/21/2023) 48 g 3   predniSONE (DELTASONE) 10 MG tablet 1 tab 3 x day for 2 days, then 1 tab 2 x day for 2 days, then 1 tab 1 x day for 3 days (Patient not taking: Reported on 03/21/2023) 13 tablet 0   No current facility-administered medications on file prior to visit.    LABS/IMAGING: No results found for this or any previous visit (from the past 48 hour(s)).  No results found.  LIPID PANEL:    Component Value Date/Time   CHOL 241 (H) 02/06/2022 0901   TRIG 54 02/06/2022 0901   HDL 114 02/06/2022 0901   CHOLHDL 2.1 02/06/2022 0901   CHOLHDL 2.1 07/31/2021 0940   VLDL 13 02/04/2017 1550   LDLCALC 118 (H) 02/06/2022 0901   LDLCALC 106 (H) 07/31/2021 0940    WEIGHTS: Wt Readings from Last 3 Encounters:  03/21/23 161 lb 12.8 oz (73.4 kg)  02/17/23 163 lb 3.2 oz (74 kg)  01/27/23 163 lb (73.9 kg)    VITALS: BP (!) 102/52 (BP Location: Left  Arm, Patient Position: Sitting, Cuff Size: Normal)   Pulse 84   Ht 5\' 6"  (1.676 m)   Wt 161 lb 12.8 oz (73.4 kg)   SpO2 96%   BMI 26.12 kg/m   EXAM: Deferred  EKG: Deferred  ASSESSMENT: Mixed dyslipidemia, high LDL and HDL, probable genetic dyslipidemia CAC score of 43, 64th percentile (06/2019) Statin intolerance - myalgias Suboptimal lipid lowering with Repatha Elevated LP(a) of 415 nmol/L.  PLAN: 1.   Laura Maynard has finally had a decent response to the [facility].  She has had a marked reduction in her LP(a) and improvement in her NMR and LDL numbers.  Will plan to continue this therapy and she has her next injection in November and I will follow-up with her afterwards.  Laura Nose, MD, [facility], FACP  [place]  [facility]  Medical Director of the Advanced Lipid Disorders &  Cardiovascular Risk Reduction Clinic Diplomate of the American Board of Clinical Lipidology Attending Cardiologist  Direct Dial: [phone]  Fax: [phone]  Website:  [email or web address] 03/21/2023, 9:54 AM

## 2023-03-21 NOTE — Patient Instructions (Signed)
Medication Instructions:  NO CHANGES  *If you need a refill on your cardiac medications before your next appointment, please call your pharmacy*   Lab Work: FASTING lab work to check cholesterol in January 2025  If you have labs (blood work) drawn today and your tests are completely normal, you will receive your results only by: [employer] (if you have MyChart) OR A paper copy in the mail If you have any lab test that is abnormal or we need to change your treatment, we will call you to review the results.    Follow-Up: At [facility], you and your health needs are our priority.  As part of our continuing mission to provide you with exceptional heart care, we have created designated Provider Care Teams.  These Care Teams include your primary Cardiologist (physician) and Advanced Practice Providers (APPs -  Physician Assistants and Nurse Practitioners) who all work together to provide you with the care you need, when you need it.  We recommend signing up for the patient portal called "MyChart".  Sign up information is provided on this After Visit Summary.  MyChart is used to connect with patients for Virtual Visits (Telemedicine).  Patients are able to view lab/test results, encounter notes, upcoming appointments, etc.  Non-urgent messages can be sent to your provider as well.   To learn more about what you can do with MyChart, go to [email or web address].    Your next appointment:    January 2025 with Dr. Rennis Golden

## 2023-04-15 DIAGNOSIS — H31011 Macula scars of posterior pole (postinflammatory) (post-traumatic), right eye: Secondary | ICD-10-CM | POA: Diagnosis not present

## 2023-04-15 DIAGNOSIS — H04123 Dry eye syndrome of bilateral lacrimal glands: Secondary | ICD-10-CM | POA: Diagnosis not present

## 2023-04-26 NOTE — Progress Notes (Unsigned)
MEDICARE ANNUAL WELLNESS VISIT AND FOLLOW UP  Assessment:    Annual Medicare Wellness Visit Due annually  Health maintenance reviewed Requested advanced directives/living will paperwork Requested covid 19 booster documenation Cologuard 2021 negative- due now  Essential hypertension - controlled currently without medication - Continue DASH diet, exercise and monitor at home. Call if greater than 130/80.  - CBC, CMP  Abnormal Glucose Continue diet and exercise Monitor - CMP  Postablative hypothyroidism  follows with Dr. Reece Agar, continue med the same at this time, declines TSH today, has upcoming appointment   Hyperlipidemia -declines medications -  check lipids, decrease fatty foods, increase activity.  - saw lipid clinic, continues Leqvio 284 mg/1.5 ml SQ q 6 months - zetia intolerance, not candidate for nexlitol due to lack of vascular dx; no aortic atherosclerosis per coronary calcium CT  TMJ (temporomandibular joint syndrome) Monitor  Benign paroxysmal positional vertigo due to bilateral vestibular disorder Monitor, takes meclizine PRN, none recent   Multinodular goiter  follows with Dr. Reece Agar, continue med the same at this time  Vitamin D deficiency Continue Vit D supplementation to maintain value in therapeutic level of 60-100   Other obsessive-compulsive disorders Patient reports improved symptoms  Monitor with routine screening  Medication management - Magnesium  Overweight, BMI 27 - long discussion about weight loss, diet, and exercise -recommended diet heavy in fruits and veggies and low in animal meats, cheeses, and dairy products  Migraines Improved; monitor  -Continue Fioricet as needed  Screening for Colon Cancer - Cologuard ordered     Over 30 minutes of exam, counseling, chart review, and critical decision making was performed  Future Appointments  Date Time Provider Department Center  05/28/2023  9:00 AM Carlus Pavlov, MD [facility] None   07/22/2023  8:30 AM [facility] None  07/30/2023 10:30 AM Lucky Cowboy, MD [facility] None  09/19/2023  9:00 AM Chrystie Nose, MD [facility] None  02/04/2024 10:00 AM Lucky Cowboy, MD [facility] None  04/27/2024 10:00 AM Raynelle Dick, NP [facility] None    Plan:   During the course of the visit the patient was educated and counseled about appropriate screening and preventive services including:   Pneumococcal vaccine  Influenza vaccine Td vaccine Prevnar 13 Screening electrocardiogram Screening mammography Bone densitometry screening Colorectal cancer screening Diabetes screening Glaucoma screening Nutrition counseling  Advanced directives: given info/requested copies   Subjective:   Laura Maynard is a 77 y.o. female who presents for Medicare Annual Wellness Visit and 3 month follow up on hypertension, glucose management, hyperlipidemia, vitamin D def.  She is getting knee injections by Dr. Lequita Halt for bilateral knee arthritis, discussing knee replacements. She has not had any recent cortisone injections.  Will alternate Aleve and Tylenol and use ice when pain is bad.    She has long history of migraines, recently improved. Maybe once a week currently and will have severe migraine about 3 times a year. Having more allergy related sinus headaches, taking zyrtec D, uses flonase intermittently.  Uses fioricet intermittently  BMI is Body mass index is 26.52 kg/m., she has not been working on diet, has been doing 10 min of stationary cycle daily  Wt Readings from Last 3 Encounters:  04/28/23 161 lb 12.8 oz (73.4 kg)  03/21/23 161 lb 12.8 oz (73.4 kg)  02/17/23 163 lb 3.2 oz (74 kg)   Pulse Readings from Last 3 Encounters:  04/28/23 89  03/21/23 84  02/17/23 87     Her blood pressure has been controlled  at home without medications it has been running 120's/80's, today their BP is BP: 118/62  BP Readings from Last 3 Encounters:  04/28/23 118/62   03/21/23 134/64  02/17/23 130/68  She does not workout. She denies chest pain, shortness of breath, dizziness.    She is on cholesterol medication (currently on Leqvio, hx of myalgia with pravastatin, rosuvastatin and zetia, has seen lipid clinic, coronary calcium score of 43, 64th percentile, NO atherosclerosis, did not see benefit repatha) and denies myalgias. Her cholesterol is not at goal. The cholesterol last visit was:   Lab Results  Component Value Date   CHOL 241 (H) 02/06/2022   HDL 114 02/06/2022   LDLCALC 118 (H) 02/06/2022   TRIG 54 02/06/2022   CHOLHDL 2.1 02/06/2022    She has hx of prediabetes (A1C 5.8% in 2015, 5.7% in 2016) recently well controlled  Lab Results  Component Value Date   HGBA1C 5.5 01/27/2023   Patient is taking 1000-2000 IU vitamin D, cannot tolerate higher dose Lab Results  Component Value Date   VD25OH 34 01/27/2023    hx/o Toxic MNG and was treated by Dr Elvera Lennox in Sept 2015 with RAI-131*  She is on thyroid medication, follows with Dr. Elvera Lennox. Her medication was not changed last visit.  Currently on Levothyroxine 50 mcg 11/2 tab daily Had recent check -  Lab Results  Component Value Date   TSH 1.62 01/27/2023   History of abnormal glucose, controlled with diet and exercise Lab Results  Component Value Date   HGBA1C 5.5 01/27/2023    Lab Results  Component Value Date   EGFR 85 01/27/2023    Medication Review  Current Outpatient Medications (Endocrine & Metabolic):    levothyroxine (SYNTHROID) 50 MCG tablet, TAKE 1 AND 1/2 TABLETS BY MOUTH DAILY   predniSONE (DELTASONE) 10 MG tablet, 1 tab 3 x day for 2 days, then 1 tab 2 x day for 2 days, then 1 tab 1 x day for 3 days (Patient not taking: Reported on 03/21/2023)  Current Outpatient Medications (Cardiovascular):    inclisiran (LEQVIO) 284 MG/1.5ML SOSY injection, Inject 284mg  SQ at month 0 and month 3, then every 6 months  Current Outpatient Medications (Respiratory):    AYR SALINE  NASAL NA, Place into the nose.   fluticasone (FLONASE) 50 MCG/ACT nasal spray, INSTILL 1-2 SPRAYS INTO BOTH NOSTRILS TWICE DAILY AS NEEDED FOR ALLERGIES (Patient not taking: Reported on 03/21/2023)  Current Outpatient Medications (Analgesics):    Acetaminophen 325 MG CAPS, Take by mouth.   Naproxen Sodium (ALEVE PO), Take by mouth. PRN   butalbital-acetaminophen-caffeine (FIORICET) 50-325-40 MG tablet, TAKE 1 TABLET  EVERY 4 HOURS AS NEEDED FOR SEVERE HEADACHE                                                                               /                                      TAKE  BY                                        MOUTH   naproxen sodium (ANAPROX) 220 MG tablet, Take 440 mg by mouth 2 (two) times daily as needed (pain). (Patient not taking: Reported on 04/28/2023)  Current Outpatient Medications (Hematological):    Cyanocobalamin (VITAMIN B 12 PO), Take by mouth.  Current Outpatient Medications (Other):    cholecalciferol (VITAMIN D3) 25 MCG (1000 UNIT) tablet, Take 1 tablet (1,000 Units total) by mouth 2 (two) times daily with a meal.   cycloSPORINE (RESTASIS) 0.05 % ophthalmic emulsion, Apply to eye.   meclizine (ANTIVERT) 25 MG tablet, Take      1 tablet      3 x day       if needed for Dizziness /Vertigo   metroNIDAZOLE (METROGEL) 1 % gel, Apply topically daily. Apply  Daily  to Acne Rosacea rash   NEOMYCIN-POLYMYXIN-HYDROCORTISONE (CORTISPORIN) 1 % SOLN OTIC solution, Apply  6 to 8 drops to affected ear 3 to 4 x /day   Biotin 1000 MCG CHEW, Chew by mouth. (Patient not taking: Reported on 03/21/2023)  Allergies: Allergies  Allergen Reactions   Sulfa Antibiotics Hives   Doxycycline Itching    itching   Gabapentin Nausea And Vomiting   Penicillins Hives   Pravastatin     Myalgia   Rosuvastatin Other (See Comments)    Joint/muscle aches, fatigue   Zetia [Ezetimibe]     Myalgia    Current Problems (verified) has Labile  hypertension; History of prediabetes; Vitamin D deficiency; Hyperlipidemia, mixed; Poor compliance with medication; Benign paroxysmal positional vertigo; TMJ (temporomandibular joint syndrome); Postablative hypothyroidism; BMI 27.0-27.9,adult; Medication Phobia; Multinodular goiter; FH: hypertension; Abnormal glucose; Statin myopathy; Migraine without aura and without status migrainosus, not intractable; Insomnia; Right sided sciatica; Sialectasis; Seasonal allergic conjunctivitis; Pseudophakia of left eye; Dry eye syndrome, bilateral; Cataract, nuclear sclerotic, left eye; Cataract cortical, senile, left; Blood in urine; Aphakia, right eye; and Osteoarthritis of knees, bilateral on their problem list.  Screening Tests Immunization History  Administered Date(s) Administered   Influenza Split 06/30/2013   Influenza, High Dose Seasonal PF 06/26/2015, 07/25/2016, 05/26/2017, 06/30/2018, 06/10/2019, 06/26/2020, 07/02/2021, 07/03/2022   PFIZER(Purple Top)SARS-COV-2 Vaccination 11/26/2019, 12/25/2019, 06/30/2020   Pneumococcal Conjugate-13 09/14/2014   Pneumococcal Polysaccharide-23 10/16/2011, 02/04/2017   Td 10/16/2011, 08/01/2022   Zoster, Live 01/14/2007   Health Maintenance  Topic Date Due   COVID-19 Vaccine (4 - 2023-24 season) 05/14/2023 (Originally 05/31/2022)   Zoster Vaccines- Shingrix (1 of 2) 06/29/2023 (Originally [birth year]/06/1966)   INFLUENZA VACCINE  05/01/2023   MAMMOGRAM  10/03/2023   Medicare Annual Wellness (AWV)  04/27/2024   DTaP/Tdap/Td (3 - Tdap) 08/01/2032   Pneumonia Vaccine 76+ Years old  Completed   DEXA SCAN  Completed   Hepatitis C Screening  Completed   HPV VACCINES  Aged Out   Fecal DNA (Cologuard)  Discontinued     Names of Other Physician/Practitioners you currently use: 1. [facility]- here for primary care 2. Dr Leeroy Bock 03/2023 3. [facility], dentist, 01/2023  Patient Care Team: Lucky Cowboy, MD as PCP - General  (Internal Medicine) Rennis Golden Lisette Abu, MD as PCP - Cardiology (Cardiology) Doristine Section, MD (Dermatology) Carlus Pavlov, MD as Consulting Physician (Internal Medicine) Levert Feinstein, MD as Consulting Physician (Neurology) Iva Boop, MD as Consulting Physician (Gastroenterology) Drema Halon,  MD (Inactive) as Consulting Physician (Otolaryngology) Ollen Gross, MD as Consulting Physician (Orthopedic Surgery)  Surgical: She  has a past surgical history that includes Eye surgery (1964); Back surgery (1970); Nasal septum surgery (1998); Total abdominal hysterectomy (2000); Knee arthroscopy (2002); Colonoscopy (2006; 03/28/11); and Cataract extraction (Left, 12/2017). Family Her family history includes COPD in her sister; Diabetes in her brother; Emphysema in her father; Lung cancer (age of onset: 27) in her sister; Stroke in her mother; Throat cancer (age of onset: 4) in her brother. Social history  She reports that she has never smoked. She has never used smokeless tobacco. She reports that she does not drink alcohol and does not use drugs.  MEDICARE WELLNESS OBJECTIVES: Physical activity:  Not organized, does shopping and house work Cardiac risk factors: Cardiac Risk Factors include: advanced age (>10men, >47 women);dyslipidemia;hypertension;sedentary lifestyle Depression/mood screen:      04/28/2023   10:27 AM  Depression screen PHQ 2/9  Decreased Interest 0  Down, Depressed, Hopeless 0  PHQ - 2 Score 0    ADLs:     04/28/2023   10:24 AM 01/26/2023    9:07 PM  In your present state of health, do you have any difficulty performing the following activities:  Hearing? 0 0  Vision? 0 0  Difficulty concentrating or making decisions? 0 0  Walking or climbing stairs? 0 0  Dressing or bathing? 0 0  Doing errands, shopping? 0 0     Cognitive Testing  Alert? Yes  Normal Appearance?Yes  Oriented to person? Yes  Place? Yes   Time? Yes  Recall of three objects?  Yes  Can  perform simple calculations? Yes  Displays appropriate judgment?Yes  Can read the correct time from a watch face?Yes  EOL planning: Does Patient Have a Medical Advance Directive?: Yes Type of Advance Directive: Living will, Healthcare Power of Attorney Does patient want to make changes to medical advance directive?: No - Patient declined Copy of Healthcare Power of Attorney in Chart?: No - copy requested   Objective:   Today's Vitals   04/28/23 0959  BP: 118/62  Pulse: 89  Temp: 97.9 F (36.6 C)  SpO2: 97%  Weight: 161 lb 12.8 oz (73.4 kg)  Height: 5' 5.5" (1.664 m)      Wt Readings from Last 3 Encounters:  04/28/23 161 lb 12.8 oz (73.4 kg)  03/21/23 161 lb 12.8 oz (73.4 kg)  02/17/23 163 lb 3.2 oz (74 kg)    Body mass index is 26.52 kg/m.  General appearance: alert, no distress, WD/WN,  female HEENT: normocephalic, sclerae anicteric, TMs pearly, nares patent, no discharge or erythema, pharynx normal. R pupil fixed (since teen following eye trauma), L round and reactive  Oral cavity: MMM, no lesions Neck: supple, no lymphadenopathy, no thyromegaly, no masses Heart: RRR, normal S1, S2, no murmurs Lungs: CTA bilaterally, no wheezes, rhonchi, or rales Abdomen: +bs, soft, non tender, non distended, no masses, no hepatomegaly, no splenomegaly Musculoskeletal: non-tender, no effusions; neg straight leg raise; R 2nd mcp joint with some bony enlargement.  Extremities: no edema, no cyanosis, no clubbing. Spider veins noted lower legs bilaterally Pulses: 2+ symmetric, upper and lower extremities, normal cap refill Neurological: alert, oriented x 3, CN2-12 intact except pupils, strength normal upper extremities and lower extremities, sensation normal throughout, DTRs 2+ throughout, no cerebellar signs, gait normal Psychiatric: normal affect, behavior normal, pleasant     Medicare Attestation I have personally reviewed: The patient's medical and social history Their use of  alcohol, tobacco or illicit drugs Their current medications and supplements The patient's functional ability including ADLs,fall risks, home safety risks, cognitive, and hearing and visual impairment Diet and physical activities Evidence for depression or mood disorders  The patient's weight, height, BMI, and visual acuity have been recorded in the chart.  I have made referrals, counseling, and provided education to the patient based on review of the above and I have provided the patient with a written personalized care plan for preventive services.     Raynelle Dick, NP   04/28/2023

## 2023-04-28 ENCOUNTER — Ambulatory Visit: Payer: Medicare PPO | Admitting: Nurse Practitioner

## 2023-04-28 ENCOUNTER — Encounter: Payer: Self-pay | Admitting: Nurse Practitioner

## 2023-04-28 VITALS — BP 118/62 | HR 89 | Temp 97.9°F | Ht 65.5 in | Wt 161.8 lb

## 2023-04-28 DIAGNOSIS — E890 Postprocedural hypothyroidism: Secondary | ICD-10-CM

## 2023-04-28 DIAGNOSIS — Z0001 Encounter for general adult medical examination with abnormal findings: Secondary | ICD-10-CM

## 2023-04-28 DIAGNOSIS — R7309 Other abnormal glucose: Secondary | ICD-10-CM | POA: Diagnosis not present

## 2023-04-28 DIAGNOSIS — Z79899 Other long term (current) drug therapy: Secondary | ICD-10-CM

## 2023-04-28 DIAGNOSIS — G44219 Episodic tension-type headache, not intractable: Secondary | ICD-10-CM

## 2023-04-28 DIAGNOSIS — Z6827 Body mass index (BMI) 27.0-27.9, adult: Secondary | ICD-10-CM

## 2023-04-28 DIAGNOSIS — Z0000 Encounter for general adult medical examination without abnormal findings: Secondary | ICD-10-CM

## 2023-04-28 DIAGNOSIS — F428 Other obsessive-compulsive disorder: Secondary | ICD-10-CM

## 2023-04-28 DIAGNOSIS — R6889 Other general symptoms and signs: Secondary | ICD-10-CM

## 2023-04-28 DIAGNOSIS — E042 Nontoxic multinodular goiter: Secondary | ICD-10-CM

## 2023-04-28 DIAGNOSIS — G43009 Migraine without aura, not intractable, without status migrainosus: Secondary | ICD-10-CM

## 2023-04-28 DIAGNOSIS — E559 Vitamin D deficiency, unspecified: Secondary | ICD-10-CM

## 2023-04-28 DIAGNOSIS — M26609 Unspecified temporomandibular joint disorder, unspecified side: Secondary | ICD-10-CM

## 2023-04-28 DIAGNOSIS — H8110 Benign paroxysmal vertigo, unspecified ear: Secondary | ICD-10-CM

## 2023-04-28 DIAGNOSIS — Z23 Encounter for immunization: Secondary | ICD-10-CM

## 2023-04-28 DIAGNOSIS — E782 Mixed hyperlipidemia: Secondary | ICD-10-CM

## 2023-04-28 DIAGNOSIS — I10 Essential (primary) hypertension: Secondary | ICD-10-CM | POA: Diagnosis not present

## 2023-04-28 DIAGNOSIS — Z1211 Encounter for screening for malignant neoplasm of colon: Secondary | ICD-10-CM

## 2023-04-28 LAB — CBC WITH DIFFERENTIAL/PLATELET
Absolute Monocytes: 568 cells/uL (ref 200–950)
Basophils Absolute: 58 cells/uL (ref 0–200)
Basophils Relative: 1 %
Eosinophils Absolute: 70 cells/uL (ref 15–500)
Eosinophils Relative: 1.2 %
HCT: 37 % (ref 35.0–45.0)
Hemoglobin: 11.9 g/dL (ref 11.7–15.5)
Lymphs Abs: 1688 cells/uL (ref 850–3900)
MCH: 27.1 pg (ref 27.0–33.0)
MCHC: 32.2 g/dL (ref 32.0–36.0)
MCV: 84.3 fL (ref 80.0–100.0)
MPV: 9.8 fL (ref 7.5–12.5)
Monocytes Relative: 9.8 %
Neutro Abs: 3416 cells/uL (ref 1500–7800)
Neutrophils Relative %: 58.9 %
Platelets: 403 10*3/uL — ABNORMAL HIGH (ref 140–400)
RBC: 4.39 10*6/uL (ref 3.80–5.10)
RDW: 13.3 % (ref 11.0–15.0)
Total Lymphocyte: 29.1 %
WBC: 5.8 10*3/uL (ref 3.8–10.8)

## 2023-04-28 MED ORDER — BUTALBITAL-APAP-CAFFEINE 50-325-40 MG PO TABS: ORAL_TABLET | ORAL | 0 refills | Status: DC

## 2023-04-28 NOTE — Patient Instructions (Signed)

## 2023-05-08 DIAGNOSIS — H16223 Keratoconjunctivitis sicca, not specified as Sjogren's, bilateral: Secondary | ICD-10-CM | POA: Diagnosis not present

## 2023-05-08 DIAGNOSIS — H21561 Pupillary abnormality, right eye: Secondary | ICD-10-CM | POA: Diagnosis not present

## 2023-05-08 DIAGNOSIS — H04123 Dry eye syndrome of bilateral lacrimal glands: Secondary | ICD-10-CM | POA: Diagnosis not present

## 2023-05-08 DIAGNOSIS — H5709 Other anomalies of pupillary function: Secondary | ICD-10-CM | POA: Diagnosis not present

## 2023-05-12 DIAGNOSIS — Z1211 Encounter for screening for malignant neoplasm of colon: Secondary | ICD-10-CM | POA: Diagnosis not present

## 2023-05-28 ENCOUNTER — Encounter: Payer: Self-pay | Admitting: Internal Medicine

## 2023-05-28 ENCOUNTER — Ambulatory Visit: Payer: Medicare PPO | Admitting: Internal Medicine

## 2023-05-28 VITALS — BP 120/70 | HR 80 | Ht 65.5 in | Wt 162.6 lb

## 2023-05-28 DIAGNOSIS — E890 Postprocedural hypothyroidism: Secondary | ICD-10-CM | POA: Diagnosis not present

## 2023-05-28 DIAGNOSIS — E559 Vitamin D deficiency, unspecified: Secondary | ICD-10-CM | POA: Diagnosis not present

## 2023-05-28 DIAGNOSIS — E042 Nontoxic multinodular goiter: Secondary | ICD-10-CM | POA: Diagnosis not present

## 2023-05-28 MED ORDER — LEVOTHYROXINE SODIUM 50 MCG PO TABS: 75.0000 ug | ORAL_TABLET | Freq: Every day | ORAL | 3 refills | Status: DC

## 2023-05-28 NOTE — Patient Instructions (Addendum)
Please continue vit D 1000 units daily.   Continue Levothyroxine 75 mcg daily.  Take the thyroid hormone every day, with water, at least 30 minutes before breakfast, separated by at least 4 hours from: - acid reflux medications - calcium - iron - multivitamins  Please come back for a follow-up appointment in 1 year.

## 2023-05-28 NOTE — Progress Notes (Signed)
Patient ID: Laura Maynard, female   DOB: October 28, 1945, 77 y.o.   MRN: [id number]  HPI  Laura Maynard is a 77 y.o.-year-old female, returning for f/u for postablative hypothyroidism after toxic multinodular goiter (TMNG) radioactive iodine treatment. Last visit 1 year ago.  Interim history: She continues to have knee pain.  No recent steroid injections - may need one in 06/2023. He otherwise feels well, without complaints.  She lost 11 pounds since last visit, which she mentions is not necessarily intentional.  Reviewed history: Pt had screening labs in 01/2014 and was found to have a low TSH. This was repeated 2x and the TSH continued to decrease.   Thyroid Uptake and scan (05/20/2014) >> uptake 48.5% and scan c/w TMNG.  We started MMI 5 mg bid.  Had RAI tx on 06/09/2014.   After RAI treatment, she developed post-ablative hypothyroidism >> we started levothyroxine 50 g daily. She developed leg cramps, which resolved after stopping levothyroxine but her TSH started to increase, so we change to Tirosint. Unfortunately, she could not afford this and we had to restart levothyroxine at the lower dose, subsequent to be increased back to 50 g daily (on 08/12/2016).  Her TSH became suppressed after increased the dose to 75 mcg daily.    She had itching with a 75 mcg tablet so we switched to only 50 mcg tablets, which are white.  She is tolerating these well.  She continues on levothyroxine 75 mcg (1.5 tablet of 50 mcg) daily: - in am - fasting - at least 30 min from b'fast - no calcium - no iron - no multivitamins - no PPIs - not on Biotin  Reviewed her TFTs: Lab Results  Component Value Date   TSH 1.62 01/27/2023   TSH 2.70 06/26/2022   TSH 0.85 11/21/2021   TSH 0.57 06/27/2021   TSH 5.02 05/18/2021   TSH 2.67 11/17/2020   TSH 4.22 07/20/2020   TSH 3.77 01/19/2020   TSH 3.41 10/26/2019   TSH 3.11 04/22/2019   FREET4 0.82 06/26/2022   FREET4 0.90 11/21/2021   FREET4 0.95  06/27/2021   FREET4 0.89 05/18/2021   FREET4 0.88 11/17/2020   FREET4 0.96 10/26/2019   FREET4 0.89 04/22/2019   FREET4 0.95 10/29/2018   FREET4 0.95 10/14/2017   FREET4 1.14 08/28/2017    Pt denies: - feeling nodules in neck - hoarseness - dysphagia - choking  She also has a history of HTN, HL, anemia.  Vitamin D deficiency:  Reviewed vitamin D levels: Lab Results  Component Value Date   VD25OH 34 01/27/2023   VD25OH 29 (L) 01/23/2022   VD25OH 27 (L) 07/31/2021   VD25OH 26.0 (L) 11/17/2020   VD25OH 25.8 (L) 05/24/2020   VD25OH 22 (L) 01/19/2020   VD25OH 27.83 (L) 10/26/2019   VD25OH 24.26 (L) 04/22/2019   VD25OH 6 (L) 09/17/2018   VD25OH 12 (L) 02/05/2018   She had very low vitamin D levels in the past; she was previously on 5000 units vitamin D daily but was not compliant with this due to headaches and itching.  She was on 1000 units daily at last visit (reduced dose due to concerns for constipation), and I advised her to increase to 2000 units daily.  However at last visit she was still on 1000 units daily.  His vitamin D was low I advised her to at least alternate 1000 with 2000 units every other day.  However, she continues on 1000 units daily.She tells me that this  is the maximum amount that she can take per day.  She has knee pains and had steroid injections in her knees. Dr. Rennis Golden started a statin (Crestor 5) qod but she could not tolerate this due to knee pain. Then she tried Rapatha >> no change. Now Leqvio.  He has a history of osteopenia.  ROS: + see HPI + Muscle aches/+ joint aches - knees  I reviewed pt's medications, allergies, PMH, social hx, family hx, and changes were documented in the history of present illness. Otherwise, unchanged from my initial visit note.  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   High cholesterol    Hypertension    Hypothyroidism    Migraine    Prediabetes     Sarcoidosis, lung (HCC) 15 YRS AGO   NO TREATMENTS   Toxic multinodular goiter 05/27/2014   Vertigo    Vitamin D deficiency    Past Surgical History:  Procedure Laterality Date   BACK SURGERY  1970   CATARACT EXTRACTION Left 12/2017   COLONOSCOPY  2006; 03/28/11   2 small adenomas; normal   EYE SURGERY  1964   right eye   KNEE ARTHROSCOPY  2002   NASAL SEPTUM SURGERY  1998   TOTAL ABDOMINAL HYSTERECTOMY  2000   Social History   Socioeconomic History   Marital status: Divorced    Spouse name: Not on file   Number of children: 0   Years of education: college   Highest education level: Not on file  Occupational History    Comment: Retired  Tobacco Use   Smoking status: Never   Smokeless tobacco: Never  Vaping Use   Vaping status: Never Used  Substance and Sexual Activity   Alcohol use: Never    Alcohol/week: 1.0 standard drink of alcohol    Types: 1 Standard drinks or equivalent per week    Comment: RARE   Drug use: Never   Sexual activity: Not Currently  Other Topics Concern   Not on file  Social History Narrative   Patient is retired and lives at home alone. Patient  has college education.   Caffeine- 3 cups of caffeine daily.  One soda daily.   Right handed.   Social Determinants of Health   Financial Resource Strain: Not on file  Food Insecurity: Not on file  Transportation Needs: Not on file  Physical Activity: Not on file  Stress: Not on file  Social Connections: Not on file  Intimate Partner Violence: Not on file   Current Outpatient Medications on File Prior to Visit  Medication Sig Dispense Refill   Acetaminophen 325 MG CAPS Take by mouth.     AYR SALINE NASAL NA Place into the nose.     butalbital-acetaminophen-caffeine (FIORICET) 50-325-40 MG tablet TAKE 1 TABLET  EVERY 4 HOURS AS NEEDED FOR SEVERE HEADACHE                                                                               /  TAKE                                                    BY                                        MOUTH 30 tablet 0   cholecalciferol (VITAMIN D3) 25 MCG (1000 UNIT) tablet Take 1 tablet (1,000 Units total) by mouth 2 (two) times daily with a meal.     Cyanocobalamin (VITAMIN B 12 PO) Take by mouth.     cycloSPORINE (RESTASIS) 0.05 % ophthalmic emulsion Apply to eye.     inclisiran (LEQVIO) 284 MG/1.5ML SOSY injection Inject 284mg  SQ at month 0 and month 3, then every 6 months 1.5 mL 2   levothyroxine (SYNTHROID) 50 MCG tablet TAKE 1 AND 1/2 TABLETS BY MOUTH DAILY 150 tablet 3   meclizine (ANTIVERT) 25 MG tablet Take      1 tablet      3 x day       if needed for Dizziness /Vertigo 100 tablet 0   metroNIDAZOLE (METROGEL) 1 % gel Apply topically daily. Apply  Daily  to Acne Rosacea rash 60 g 3   Naproxen Sodium (ALEVE PO) Take by mouth. PRN     NEOMYCIN-POLYMYXIN-HYDROCORTISONE (CORTISPORIN) 1 % SOLN OTIC solution Apply  6 to 8 drops to affected ear 3 to 4 x /day 10 mL 3   No current facility-administered medications on file prior to visit.   Allergies  Allergen Reactions   Sulfa Antibiotics Hives   Doxycycline Itching    itching   Gabapentin Nausea And Vomiting   Penicillins Hives   Pravastatin     Myalgia   Rosuvastatin Other (See Comments)    Joint/muscle aches, fatigue   Zetia [Ezetimibe]     Myalgia   Family History  Problem Relation Age of Onset   Lung cancer Sister 53   Throat cancer Brother 42   Stroke Mother    Emphysema Father    COPD Sister    Diabetes Brother    Colon cancer Neg Hx    PE: BP 120/70   Pulse 80   Ht 5' 5.5" (1.664 m)   Wt 162 lb 9.6 oz (73.8 kg)   SpO2 99%   BMI 26.65 kg/m   Wt Readings from Last 3 Encounters:  05/28/23 162 lb 9.6 oz (73.8 kg)  04/28/23 161 lb 12.8 oz (73.4 kg)  03/21/23 161 lb 12.8 oz (73.4 kg)   Constitutional: overweight, in NAD Eyes: no exophthalmos ENT: moist mucous membranes, no masses palpated in neck, no cervical  lymphadenopathy Cardiovascular: RRR, No MRG Respiratory: CTA B Musculoskeletal: no deformities Skin: moist, warm, no rashes Neurological: no tremor with outstretched hands  ASSESSMENT: 1. Hypothyroidism after RAI tx   2. MNG  3. vitamin D deficiency  PLAN:  1. And 2.  Patient with history of thyrotoxicosis due to toxic multinodular goiter, resolved after RAI treatment.  She developed post ablative hypothyroidism and we started levothyroxine.  She initially complained of severe cramps with levothyroxine but she was later found to also have vitamin D deficiency, which is now treated.  We did try Tirosint in the past but this was too expensive.  She developed  itching with a 75 mcg levothyroxine tablet, so she is not using 50 mcg white tablets, which do not have coloring agents.  She tolerates these well. - no neck compression symptoms - latest thyroid labs reviewed with pt. >> normal: Lab Results  Component Value Date   TSH 1.62 01/27/2023  - she continues on LT4 75 mcg daily - pt feels good on this dose. She lost 11 lbs in last year. - we discussed about taking the thyroid hormone every day, with water, >30 minutes before breakfast, separated by >4 hours from acid reflux medications, calcium, iron, multivitamins. Pt. is taking it correctly. - At last visit she was on biotin and she is wondering whether she can restart this.  I advised her that she absolutely can but to remember to stop this 1 week prior to labs - I refilled her levothyroxine for now - we will see her back in a year  3.  Vitamin D deficiency  -She has a history of a very low vitamin D level in the past and unfortunately she could not tolerate higher doses of vitamin D supplement due to constipation -She now takes 1000 units vitamin D daily, and she mentioned that this was the maximum amount that she could take.  We tried to alternate 1000 with 2000 units every other day but she could not tolerate this. -Latest vitamin D  level was actually normal in 12/2022, at 34 -Will continue to keep an eye on this  Carlus Pavlov, MD PhD [facility] Endocrinology

## 2023-06-04 DIAGNOSIS — H2701 Aphakia, right eye: Secondary | ICD-10-CM | POA: Diagnosis not present

## 2023-06-04 DIAGNOSIS — H0288B Meibomian gland dysfunction left eye, upper and lower eyelids: Secondary | ICD-10-CM | POA: Diagnosis not present

## 2023-06-04 DIAGNOSIS — H0288A Meibomian gland dysfunction right eye, upper and lower eyelids: Secondary | ICD-10-CM | POA: Diagnosis not present

## 2023-06-04 DIAGNOSIS — G43709 Chronic migraine without aura, not intractable, without status migrainosus: Secondary | ICD-10-CM | POA: Diagnosis not present

## 2023-06-04 DIAGNOSIS — H04123 Dry eye syndrome of bilateral lacrimal glands: Secondary | ICD-10-CM | POA: Diagnosis not present

## 2023-06-04 DIAGNOSIS — H31011 Macula scars of posterior pole (postinflammatory) (post-traumatic), right eye: Secondary | ICD-10-CM | POA: Diagnosis not present

## 2023-06-18 ENCOUNTER — Other Ambulatory Visit: Payer: Self-pay | Admitting: *Deleted

## 2023-06-18 ENCOUNTER — Telehealth: Payer: Self-pay | Admitting: Internal Medicine

## 2023-06-18 DIAGNOSIS — E785 Hyperlipidemia, unspecified: Secondary | ICD-10-CM

## 2023-06-18 DIAGNOSIS — M170 Bilateral primary osteoarthritis of knee: Secondary | ICD-10-CM | POA: Diagnosis not present

## 2023-06-18 NOTE — Telephone Encounter (Signed)
Patient came in to see if she could be seen sooner. Patient is scheduled for 09/19/2023 to see Hilty. Spoke to Julaine Fusi -RN and stated patient had injection 10/22. Patient mentioned she also had questions. Asked if someone could reach out.

## 2023-06-18 NOTE — Telephone Encounter (Signed)
Left voicemail to return call to office

## 2023-06-19 NOTE — Telephone Encounter (Signed)
Advised patient of recommendations of the physician.  She states receiving earlier message from the nurse as well.

## 2023-06-19 NOTE — Telephone Encounter (Signed)
States she is supposed to be taking injection in October, but hasn't gotten any call. She wants to know who will reach out to her for this.  She goes somewhere [place] [address].   She also is on an ATB for her eye and gets cortisone in her knee.  She wants to know if this will cause any issues with her getting the injection  Lequvio. She states with the eye and her knee, she has also lost approx 12 lbs and wants to know if still OK to get the injection.  Again is it OK for the injection or she ask should she be seen prior to injection to discuss the issues

## 2023-06-19 NOTE — Telephone Encounter (Signed)
07/22/23 at 0830 - leqvio injection  Left detailed message for patient with this appointment info   Additional questions pending MD review

## 2023-06-20 DIAGNOSIS — L218 Other seborrheic dermatitis: Secondary | ICD-10-CM | POA: Diagnosis not present

## 2023-07-11 ENCOUNTER — Telehealth: Payer: Self-pay | Admitting: Internal Medicine

## 2023-07-11 NOTE — Telephone Encounter (Signed)
Pt c/o medication issue:  1. Name of Medication:   inclisiran (LEQVIO) 284 MG/1.5ML SOSY injection    2. How are you currently taking this medication (dosage and times per day)?   3. Are you having a reaction (difficulty breathing--STAT)?   4. What is your medication issue? Margaret from [facility] [employer] is requesting proof of income from patient in order to continue with [employer] Patient Assistance for this medication.  Once the patient brings this proof, she is requesting it be faxed to the following:     Fax # [phone] Attn: Claris Che

## 2023-07-11 NOTE — Telephone Encounter (Signed)
Called pt to verify she knows that she needs to bring in proof of income. She verbalized understanding. No further questions at this time.

## 2023-07-17 NOTE — Telephone Encounter (Signed)
Received patient's SS statement. Faxed to Claris Che at # provided

## 2023-07-22 ENCOUNTER — Ambulatory Visit: Payer: Medicare PPO | Admitting: *Deleted

## 2023-07-22 VITALS — BP 122/80 | HR 86 | Temp 98.2°F | Resp 16 | Ht 66.0 in | Wt 160.0 lb

## 2023-07-22 DIAGNOSIS — E782 Mixed hyperlipidemia: Secondary | ICD-10-CM | POA: Diagnosis not present

## 2023-07-22 MED ORDER — INCLISIRAN SODIUM 284 MG/1.5ML ~~LOC~~ SOSY
284.0000 mg | PREFILLED_SYRINGE | Freq: Once | SUBCUTANEOUS | Status: AC
  Administered 2023-07-22: 284 mg via SUBCUTANEOUS
  Filled 2023-07-22: qty 1.5

## 2023-07-22 NOTE — Progress Notes (Signed)
Diagnosis: Hyperlipidemia  Provider:  Mannam, Praveen MD  Procedure: Injection  Leqvio (inclisiran), Dose: 284 mg, Site: subcutaneous, Number of injections: 1  Post Care: Observation period completed  Discharge: Condition: Good, Destination: Home . AVS Provided  Performed by:  Williams, Kathryn A, RN       

## 2023-07-30 ENCOUNTER — Ambulatory Visit: Payer: Medicare PPO | Admitting: Internal Medicine

## 2023-07-30 ENCOUNTER — Encounter: Payer: Self-pay | Admitting: Internal Medicine

## 2023-07-30 VITALS — BP 118/70 | HR 95 | Temp 97.9°F | Resp 16 | Ht 65.5 in | Wt 160.8 lb

## 2023-07-30 DIAGNOSIS — Z79899 Other long term (current) drug therapy: Secondary | ICD-10-CM

## 2023-07-30 DIAGNOSIS — G44219 Episodic tension-type headache, not intractable: Secondary | ICD-10-CM | POA: Diagnosis not present

## 2023-07-30 DIAGNOSIS — R7309 Other abnormal glucose: Secondary | ICD-10-CM

## 2023-07-30 DIAGNOSIS — E782 Mixed hyperlipidemia: Secondary | ICD-10-CM | POA: Diagnosis not present

## 2023-07-30 DIAGNOSIS — R112 Nausea with vomiting, unspecified: Secondary | ICD-10-CM | POA: Diagnosis not present

## 2023-07-30 DIAGNOSIS — E559 Vitamin D deficiency, unspecified: Secondary | ICD-10-CM | POA: Diagnosis not present

## 2023-07-30 DIAGNOSIS — E890 Postprocedural hypothyroidism: Secondary | ICD-10-CM

## 2023-07-30 DIAGNOSIS — I10 Essential (primary) hypertension: Secondary | ICD-10-CM

## 2023-07-30 DIAGNOSIS — Z23 Encounter for immunization: Secondary | ICD-10-CM

## 2023-07-30 MED ORDER — ONDANSETRON HCL 8 MG PO TABS: ORAL_TABLET | ORAL | 1 refills | Status: DC

## 2023-07-30 MED ORDER — BUTALBITAL-APAP-CAFFEINE 50-325-40 MG PO TABS: ORAL_TABLET | ORAL | 0 refills | Status: DC

## 2023-07-30 MED ORDER — DEXAMETHASONE 2 MG PO TABS: ORAL_TABLET | ORAL | 0 refills | Status: DC

## 2023-07-30 NOTE — Patient Instructions (Signed)

## 2023-07-30 NOTE — Progress Notes (Signed)
Future Appointments  Date Time Provider Department  07/30/2023                6 mo ov 10:30 AM Lucky Cowboy, MD [facility]  09/19/2023  9:00 AM Chrystie Nose, MD [facility]  10/08/2023  9:30 AM Terri Piedra, DO [facility]  02/04/2024                cpe 10:00 AM Lucky Cowboy, MD [facility]  04/27/2024               3 mo ov 10:00 AM Raynelle Dick, NP [facility]    History of Present Illness:       This very nice 77 y.o. DBF  presents for  6  month follow up with HTN, HLD, Pre-Diabetes and Vitamin D Deficiency.   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        Patient has been followed for labile HTN since 2012  & BP has been controlled at home. Today's BP is at goal - 118/70. Patient has had no complaints of any cardiac type chest pain, palpitations, dyspnea Pollyann Kennedy /PND, dizziness, claudication or dependent edema.        Patient's hyperlipidemia has not been controlled in the past due to her poor diet & reticence to take any lipid lowering meds. Dr Rennis Golden  now manages herHLD & has prescribed her Leqvio.  Patient relate local skin reactions after her injections .   Last Lipids were not at goal :  Lab Results  Component Value Date   CHOL 217 (H) 04/28/2023   HDL 91 04/28/2023   LDLCALC 107 (H) 04/28/2023   TRIG 94 04/28/2023   CHOLHDL 2.4 04/28/2023     Also, the patient has history of prediabetes ( A1c 5.7% /2012 ) and has had no symptoms of reactive hypoglycemia, diabetic polys, paresthesias or visual blurring.  Last A1c was at goal :  Lab Results  Component Value Date   HGBA1C 5.5 01/27/2023                                                      Further, the patient also has history of Vitamin D Deficiency and she does not supplement vitamin D as recommended .   Last vitamin D was still low :  Lab Results  Component Value Date   VD25OH 34 01/27/2023     Current Outpatient Medications on File Prior  to Visit  Medication Sig   Acetaminophen 325 MG CAPS Take as needed   AYR SALINE NASAL NA Place into the nose.   FIORICET  tablet TAKE 1 TABLET  EVERY 4 HRS AS NEEDED    VITAMIN D 1000 UNIT Take  1 tablet /1,000 Units  2 times daily    VITAMIN B 12  Take b daily   RESTASIS 0.05 % ophth emulsion Apply to eye.   inclisiran (LEQVIO) 284 MG/1.5ML  injec Inject 284mg  SQ at month 0 and month 3, then every 6 months   levothyroxine  50 MCG tablet Take 1.5 tablets (75 mcg )  daily    meclizine  25 MG tablet Take      1 tablet      3 x day       if needed for Dizziness Tristan Schroeder  METROGEL 1 % gel Apply topically daily to Acne Rosacea rash   Naproxen  Take PRN   CORTISPORIN  1 % SOLN OTIC soln Apply  6 to 8 drops to affected ear 3 to 4 x /day     Allergies  Allergen Reactions   Sulfa Antibiotics Hives   Doxycycline Itching    itching   Gabapentin Nausea And Vomiting   Penicillins Hives   Pravastatin     Myalgia   Rosuvastatin Other (See Comments)    Joint/muscle aches, fatigue   Zetia [Ezetimibe]     Myalgia     PMHx:   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   High cholesterol    Hypertension    Hypothyroidism    Migraine    Prediabetes    Sarcoidosis, lung (HCC) 15 YRS AGO   NO TREATMENTS   Toxic multinodular goiter 05/27/2014   Vertigo    Vitamin D deficiency      Immunization History  Administered Date(s) Administered   Influenza Split 06/30/2013   Influenza, High Dose Seasonal PF 06/26/2015, 07/25/2016, 05/26/2017, 06/30/2018, 06/10/2019, 06/26/2020, 07/02/2021, 07/03/2022   PFIZER(Purple Top)SARS-COV-2 Vaccination 11/26/2019, 12/25/2019, 06/30/2020   Pneumococcal Conjugate-13 09/14/2014   Pneumococcal Polysaccharide-23 10/16/2011, 02/04/2017   Td 10/16/2011, 08/01/2022   Zoster, Live 01/14/2007      Past Surgical History:  Procedure Laterality Date   BACK SURGERY  1970   CATARACT  EXTRACTION Left 12/2017   COLONOSCOPY  2006; 03/28/11   2 small adenomas; normal   EYE SURGERY  1964   right eye   KNEE ARTHROSCOPY  2002   NASAL SEPTUM SURGERY  1998   TOTAL ABDOMINAL HYSTERECTOMY  2000     FHx:    Reviewed / unchanged   SHx:    Reviewed / unchanged    Systems Review:  Constitutional: Denies fever, chills, wt changes, headaches, insomnia, fatigue, night sweats, change in appetite. Eyes: Denies redness, blurred vision, diplopia, discharge, itchy, watery eyes.  ENT: Denies discharge, congestion, post nasal drip, epistaxis, sore throat, earache, hearing loss, dental pain, tinnitus, vertigo, sinus pain, snoring.  CV: Denies chest pain, palpitations, irregular heartbeat, syncope, dyspnea, diaphoresis, orthopnea, PND, claudication or edema. Respiratory: denies cough, dyspnea, DOE, pleurisy, hoarseness, laryngitis, wheezing.  Gastrointestinal: Denies dysphagia, odynophagia, heartburn, reflux, water brash, abdominal pain or cramps, nausea, vomiting, bloating, diarrhea, constipation, hematemesis, melena, hematochezia  or hemorrhoids. Genitourinary: Denies dysuria, frequency, urgency, nocturia, hesitancy, discharge, hematuria or flank pain. Musculoskeletal: Denies arthralgias, myalgias, stiffness, jt. swelling, pain, limping or strain/sprain.  Skin: Denies pruritus, rash, hives, warts, acne, eczema or change in skin lesion(s). Neuro: No weakness, tremor, incoordination, spasms, paresthesia or pain. Psychiatric: Denies confusion, memory loss or sensory loss. Endo: Denies change in weight, skin or hair change.  Heme/Lymph: No excessive bleeding, bruising or enlarged lymph nodes.   Physical Exam  BP 118/70   Pulse 95   Temp 97.9 F (36.6 C)   Resp 16   Ht 5' 5.5" (1.664 m)   Wt 160 lb 12.8 oz (72.9 kg)   SpO2 97%   BMI 26.35 kg/m   Appears  well nourished, well groomed  and in no distress.  Eyes: PERRLA, EOMs, conjunctiva no swelling or erythema. Sinuses: No  frontal/maxillary tenderness ENT/Mouth: EAC's clear, TM's nl w/o erythema, bulging. Nares clear w/o erythema, swelling, exudates. Oropharynx clear without erythema or exudates. Oral hygiene is good. Tongue normal, non obstructing. Hearing intact.  Neck: Supple. Thyroid not palpable. Car 2+/2+ without bruits, nodes or JVD. Chest: Respirations nl with BS clear & equal w/o rales, rhonchi, wheezing or stridor.  Cor: Heart sounds normal w/ regular rate and rhythm without sig. murmurs, gallops, clicks or rubs. Peripheral pulses normal and equal  without edema.  Abdomen: Soft & bowel sounds normal. Non-tender w/o guarding, rebound, hernias, masses or organomegaly.  Lymphatics: Unremarkable.  Musculoskeletal: Full ROM all peripheral extremities, joint stability, 5/5 strength and normal gait.  Skin: Warm, dry without exposed rashes, lesions or ecchymosis apparent.  Neuro: Cranial nerves intact, reflexes equal bilaterally. Sensory-motor testing grossly intact. Tendon reflexes grossly intact.  Pysch: Alert & oriented x 3.  Insight and judgement nl & appropriate. No ideations.   Assessment and Plan:   1. Essential hypertension  - Continue medication, monitor blood pressure at home.  - Continue DASH diet.  Reminder to go to the ER if any CP,  SOB, nausea, dizziness, severe HA, changes vision/speech.     - CBC with Differential/Platelet - COMPLETE METABOLIC PANEL WITH GFR - Magnesium   2. Hyperlipidemia, mixed  - Continue diet/meds, exercise,& lifestyle modifications.  - Continue monitor periodic cholesterol/liver & renal functions      3. Abnormal glucose  - Continue diet, exercise  - Lifestyle modifications.  - Monitor appropriate labs    - Hemoglobin A1c - Insulin, random   4. Vitamin D deficiency  - Continue supplementation.    5. Postablative hypothyroidism   6. Medication management  - CBC with Differential/Platelet - COMPLETE METABOLIC PANEL WITH GFR - Magnesium -  Hemoglobin A1c - Insulin, random - butalbital-acetaminophen-caffeine (FIORICET) 50-325-40 MG tablet; TAKE 1 TABLET  EVERY 4 HRS AS NEEDED FOR SEVERE HEADACHE                                                                          Dispense: 30 tablet; Refill: 0   7. Need for immunization against influenza  - patient deferred  today   8. Episodic tension-type headache, not intractable  - butalbital-acetaminophen-caffeine (FIORICET) 50-325-40 MG tablet;  TAKE 1 TABLET  EVERY 4 HOURS AS NEEDED FOR SEVERE HEADACHE                                                                                Dispense: 30 tablet; Refill: 0   9. Nausea and vomiting, unspecified vomiting type  - ondansetron (ZOFRAN) 8 MG tablet;  Take  1/2 to 1  tablet    3 x /day (every 6 hours)   if Needed for Nausea  or Vomitting   Dispense: 30 tablet; Refill: 1          Discussed  regular exercise, BP monitoring, weight control to achieve/maintain BMI less than 25 and discussed med and SE's. Recommended labs to assess /monitor clinical status .  I discussed the assessment and treatment plan with the patient. The patient was provided an  opportunity to ask questions and all were answered. The patient agreed with the plan and demonstrated an understanding of the instructions.  I provided over 30 minutes of exam, counseling, chart review and  complex critical decision making.        The patient was advised to call back or seek an in-person evaluation if the symptoms worsen or if the condition fails to improve as anticipated.   Marinus Maw, MD

## 2023-07-31 LAB — CBC WITH DIFFERENTIAL/PLATELET
Absolute Lymphocytes: 1325 {cells}/uL (ref 850–3900)
Absolute Monocytes: 400 {cells}/uL (ref 200–950)
Basophils Absolute: 41 {cells}/uL (ref 0–200)
Basophils Relative: 0.9 %
Eosinophils Absolute: 92 {cells}/uL (ref 15–500)
Eosinophils Relative: 2 %
HCT: 37.8 % (ref 35.0–45.0)
Hemoglobin: 12 g/dL (ref 11.7–15.5)
MCH: 27.1 pg (ref 27.0–33.0)
MCHC: 31.7 g/dL — ABNORMAL LOW (ref 32.0–36.0)
MCV: 85.5 fL (ref 80.0–100.0)
MPV: 9.4 fL (ref 7.5–12.5)
Monocytes Relative: 8.7 %
Neutro Abs: 2742 {cells}/uL (ref 1500–7800)
Neutrophils Relative %: 59.6 %
Platelets: 420 10*3/uL — ABNORMAL HIGH (ref 140–400)
RBC: 4.42 10*6/uL (ref 3.80–5.10)
RDW: 13.1 % (ref 11.0–15.0)
Total Lymphocyte: 28.8 %
WBC: 4.6 10*3/uL (ref 3.8–10.8)

## 2023-07-31 LAB — COMPLETE METABOLIC PANEL WITH GFR
AG Ratio: 1.7 (calc) (ref 1.0–2.5)
ALT: 14 U/L (ref 6–29)
AST: 18 U/L (ref 10–35)
Albumin: 4.3 g/dL (ref 3.6–5.1)
Alkaline phosphatase (APISO): 113 U/L (ref 37–153)
BUN: 10 mg/dL (ref 7–25)
CO2: 28 mmol/L (ref 20–32)
Calcium: 9.8 mg/dL (ref 8.6–10.4)
Chloride: 107 mmol/L (ref 98–110)
Creat: 0.81 mg/dL (ref 0.60–1.00)
Globulin: 2.5 g/dL (ref 1.9–3.7)
Glucose, Bld: 88 mg/dL (ref 65–99)
Potassium: 4.2 mmol/L (ref 3.5–5.3)
Sodium: 141 mmol/L (ref 135–146)
Total Bilirubin: 0.5 mg/dL (ref 0.2–1.2)
Total Protein: 6.8 g/dL (ref 6.1–8.1)
eGFR: 75 mL/min/{1.73_m2} (ref >=60)

## 2023-07-31 LAB — INSULIN, RANDOM: Insulin: 8.7 u[IU]/mL

## 2023-07-31 LAB — HEMOGLOBIN A1C
Hgb A1c MFr Bld: 5.5 %{Hb} (ref <5.7)
Mean Plasma Glucose: 111 mg/dL
eAG (mmol/L): 6.2 mmol/L

## 2023-07-31 LAB — MAGNESIUM: Magnesium: 2.3 mg/dL (ref 1.5–2.5)

## 2023-07-31 LAB — VITAMIN D 25 HYDROXY (VIT D DEFICIENCY, FRACTURES): Vit D, 25-Hydroxy: 15 ng/mL — ABNORMAL LOW (ref 30–100)

## 2023-07-31 NOTE — Progress Notes (Signed)
<>*<>*<>*<>*<>*<>*<>*<>*<>*<>*<>*<>*<>*<>*<>*<>*<>*<>*<>*<>*<>*<>*<>*<>*<> <>*<>*<>*<>*<>*<>*<>*<>*<>*<>*<>*<>*<>*<>*<>*<>*<>*<>*<>*<>*<>*<>*<>*<>*<>  -    Vitamin D = 15 is EXTREMELY & Dangerously LOW as ALWAYS   - Vitamin D goal is between 70-100.   - Please PLEASE  INCREASE your Vitamin D  from 1,000 u  2 x /day                                                                                   Up to 5,000 units  2 x / day  ! ! !   - It is very important as a natural anti-inflammatory and helping the                           immune system protect against viral infections, like Flu or Covid-19    - Also helps  hair, skin, and nails, as well as reducing stroke and heart attack risk.   - It helps your bones  ( prevents Osteoporosis ) and helps with mood.  - It also decreases numerous cancer risks so please                                                                                           take it as directed.   - Low Vit D is associated with a 200-300% higher risk for CANCER   and 200-300% higher risk for HEART   ATTACK  &  STROKE.    - It is also associated with higher death rate at younger ages,   autoimmune diseases like Rheumatoid arthritis, Lupus, Multiple Sclerosis.     - Also many other serious conditions, like depression, Alzheimer's  Dementia,  muscle aches, fatigue, fibromyalgia   <>*<>*<>*<>*<>*<>*<>*<>*<>*<>*<>*<>*<>*<>*<>*<>*<>*<>*<>*<>*<>*<>*<>*<>*<> <>*<>*<>*<>*<>*<>*<>*<>*<>*<>*<>*<>*<>*<>*<>*<>*<>*<>*<>*<>*<>*<>*<>*<>*<>  -  A1c - is Normal - No Diabetes    <>*<>*<>*<>*<>*<>*<>*<>*<>*<>*<>*<>*<>*<>*<>*<>*<>*<>*<>*<>*<>*<>*<>*<>*<> <>*<>*<>*<>*<>*<>*<>*<>*<>*<>*<>*<>*<>*<>*<>*<>*<>*<>*<>*<>*<>*<>*<>*<>*<>  -  All Else - CBC - Kidneys - Electrolytes - Liver - Magnesium & Thyroid    - all  Normal /  OK  <>*<>*<>*<>*<>*<>*<>*<>*<>*<>*<>*<>*<>*<>*<>*<>*<>*<>*<>*<>*<>*<>*<>*<>*<> <>*<>*<>*<>*<>*<>*<>*<>*<>*<>*<>*<>*<>*<>*<>*<>*<>*<>*<>*<>*<>*<>*<>*<>*<>

## 2023-08-04 DIAGNOSIS — H0288A Meibomian gland dysfunction right eye, upper and lower eyelids: Secondary | ICD-10-CM | POA: Diagnosis not present

## 2023-08-04 DIAGNOSIS — H2701 Aphakia, right eye: Secondary | ICD-10-CM | POA: Diagnosis not present

## 2023-08-04 DIAGNOSIS — H04123 Dry eye syndrome of bilateral lacrimal glands: Secondary | ICD-10-CM | POA: Diagnosis not present

## 2023-08-04 DIAGNOSIS — H0288B Meibomian gland dysfunction left eye, upper and lower eyelids: Secondary | ICD-10-CM | POA: Diagnosis not present

## 2023-08-04 DIAGNOSIS — H31011 Macula scars of posterior pole (postinflammatory) (post-traumatic), right eye: Secondary | ICD-10-CM | POA: Diagnosis not present

## 2023-08-07 ENCOUNTER — Ambulatory Visit (INDEPENDENT_AMBULATORY_CARE_PROVIDER_SITE_OTHER): Payer: Medicare PPO

## 2023-08-07 VITALS — Temp 98.0°F

## 2023-08-07 DIAGNOSIS — Z23 Encounter for immunization: Secondary | ICD-10-CM | POA: Diagnosis not present

## 2023-08-08 ENCOUNTER — Other Ambulatory Visit: Payer: Self-pay | Admitting: Internal Medicine

## 2023-08-08 DIAGNOSIS — Z1211 Encounter for screening for malignant neoplasm of colon: Secondary | ICD-10-CM

## 2023-08-08 DIAGNOSIS — Z860100 Personal history of colon polyps, unspecified: Secondary | ICD-10-CM

## 2023-08-11 ENCOUNTER — Encounter: Payer: Self-pay | Admitting: Gastroenterology

## 2023-09-15 DIAGNOSIS — E785 Hyperlipidemia, unspecified: Secondary | ICD-10-CM | POA: Diagnosis not present

## 2023-09-17 LAB — NMR, LIPOPROFILE
Cholesterol, Total: 247 mg/dL — ABNORMAL HIGH (ref 100–199)
HDL Particle Number: 34.5 umol/L (ref >=30.5)
HDL-C: 110 mg/dL (ref >39)
LDL Particle Number: 801 nmol/L (ref <1000)
LDL Size: 22.2 nmol (ref >20.5)
LDL-C (NIH Calc): 130 mg/dL — ABNORMAL HIGH (ref 0–99)
LP-IR Score: <25 (ref <=45)
Small LDL Particle Number: <90 nmol/L (ref <=527)
Triglycerides: 43 mg/dL (ref 0–149)

## 2023-09-18 ENCOUNTER — Encounter: Payer: Self-pay | Admitting: Internal Medicine

## 2023-09-18 NOTE — Progress Notes (Signed)
[place]      ADULT   &   ADOLESCENT      INTERNAL MEDICINE  Lucky Cowboy, M.D.          Rance Muir, ANP        Adela Glimpse, FNP  [facility] [address]  [place], [place]. [address] Telephone [phone] Telefax [phone]    Future Appointments  Date Time Provider Department  09/19/2023  9:00 AM Chrystie Nose, MD [facility]  09/19/2023                  acute illness 10:30 AM Lucky Cowboy, MD [facility]  10/03/2023 10:00 AM Zehr, Princella Pellegrini, PA-C [facility]  10/08/2023  9:30 AM Terri Piedra, DO [facility]  11/04/2023                  wellness 10:30 AM Raynelle Dick, NP [facility]  02/04/2024                   cpe 10:00 AM Lucky Cowboy, MD [facility]    History of Present Illness:      Patient is a very nice 77 yo DBF presenting with a 1 week prodrome of head & chest congestion and nonproductive barking dry cough.  She denies fever, chills, sweats or dyspnea.    Medications Current Outpatient Medications  Medication Instructions   Acetaminophen 325 MG CAPS Oral   FIORICET TAKE 1 TAB EVERY 4 HRS AS NEEDED    VITAMIN D3 1,000 Units, Oral, 2 times daily with meals   VITAMIN B 12  Oral   RESTASIS ophthal emulsion Ophthalmic   inclisiran (LEQVIO) 284 MG/1.5ML injec Inject 284mg  SQ at month 0 and month 3, then every 6 months   levothyroxine 75 mcg Daily before breakfast   meclizine \\25  MG tablet Take 1 tablet  3 x day if needed   METROGEL 1 % gel Apply  Daily  to Acne Rosacea r   ALEVE  PRN   CORTISPORIN OTIC solution Apply  6 to 8 drops to affected ear 3 to 4 x /day   ondansetron 8 MG tablet Take  1/2-1  tablet 3 x /day   if Needed     Problem list She has Labile hypertension; History of prediabetes; Vitamin D deficiency; Hyperlipidemia, mixed; Poor compliance with medication; Benign paroxysmal positional vertigo; TMJ (temporomandibular joint syndrome); Postablative hypothyroidism; BMI 27.0-27.9,adult; Medication  Phobia; Multinodular goiter; FH: hypertension; Abnormal glucose; Statin myopathy; Migraine without aura and without status migrainosus, not intractable; Insomnia; Right sided sciatica; Sialectasis; Seasonal allergic conjunctivitis; Pseudophakia of left eye; Dry eye syndrome, bilateral; Cataract, nuclear sclerotic, left eye; Cataract cortical, senile, left; Blood in urine; Aphakia, right eye; and Osteoarthritis of knees, bilateral on their problem list.   Observations/Objective:  BP 136/78   Pulse 86   Temp 98 F (36.7 C)   Resp 16   Ht 5\' 6"  (1.676 m)   Wt 164 lb 12.8 oz (74.8 kg)   SpO2 98%   BMI 26.60 kg/m   Persistent dry hacking cough  HEENT - WNL. Neck - supple.  Chest - Few scattered dry rales. No rhonchi /wheezes. Cor - Nl HS. RRR w/o sig MGR. PP 1(+). No edema. MS- FROM w/o deformities.  Gait Nl. Neuro -  Nl w/o focal abnormalities.   Assessment and Plan:  1. Tracheitis (Primary)  - dexamethasone  4 MG; Take 1 tab 3 x/day for 2 days,  2 x/day for 2  Days,  then 1 tab daily                        Dispense: 13 tablet; Refill: 0  - azithromycin 250 MG tablet; Take 2 tablets Day 1, then 1 tablet Daily   Dispense: 6 each; Refill: 1  - benzonatate  200 MG capsule; Take 1 perle 3 x / day to prevent cough   Dispense: 30 capsule; Refill: 1  2. Pansinusitis  - dexamethasone 4 MG ; Take 1 tab 3 x/day for 2 days, then 2 x/day for 2  Days, then 1 tab daily    Dispense: 13 tablet; Refill: 0  - azithromycin  250 MG tablet; Take 2 tablets with Food on  Day 1, then 1 tablet Daily    Dispense: 6 each; Refill: 1   Follow Up Instructions:        I discussed the assessment and treatment plan with the patient. The patient was provided an opportunity to ask questions and all were answered. The patient agreed with the plan and demonstrated an understanding of the instructions.       The patient was advised to call back or seek an in-person evaluation if the symptoms worsen or if  the condition fails to improve as anticipated.   Marinus Maw, MD

## 2023-09-19 ENCOUNTER — Other Ambulatory Visit: Payer: Self-pay

## 2023-09-19 ENCOUNTER — Ambulatory Visit (INDEPENDENT_AMBULATORY_CARE_PROVIDER_SITE_OTHER): Payer: Medicare PPO | Admitting: Internal Medicine

## 2023-09-19 ENCOUNTER — Ambulatory Visit: Payer: Medicare PPO | Attending: Internal Medicine | Admitting: Internal Medicine

## 2023-09-19 VITALS — BP 136/78 | HR 86 | Temp 98.0°F | Resp 16 | Ht 66.0 in | Wt 164.8 lb

## 2023-09-19 VITALS — BP 118/66 | HR 83 | Ht 66.0 in | Wt 164.2 lb

## 2023-09-19 DIAGNOSIS — E7841 Elevated Lipoprotein(a): Secondary | ICD-10-CM | POA: Diagnosis not present

## 2023-09-19 DIAGNOSIS — T466X5D Adverse effect of antihyperlipidemic and antiarteriosclerotic drugs, subsequent encounter: Secondary | ICD-10-CM | POA: Diagnosis not present

## 2023-09-19 DIAGNOSIS — J0410 Acute tracheitis without obstruction: Secondary | ICD-10-CM | POA: Diagnosis not present

## 2023-09-19 DIAGNOSIS — E785 Hyperlipidemia, unspecified: Secondary | ICD-10-CM

## 2023-09-19 DIAGNOSIS — M791 Myalgia, unspecified site: Secondary | ICD-10-CM

## 2023-09-19 DIAGNOSIS — R931 Abnormal findings on diagnostic imaging of heart and coronary circulation: Secondary | ICD-10-CM

## 2023-09-19 DIAGNOSIS — J324 Chronic pansinusitis: Secondary | ICD-10-CM

## 2023-09-19 MED ORDER — DEXAMETHASONE 4 MG PO TABS: ORAL_TABLET | ORAL | 0 refills | Status: DC

## 2023-09-19 MED ORDER — BENZONATATE 200 MG PO CAPS: ORAL_CAPSULE | ORAL | 1 refills | Status: DC

## 2023-09-19 MED ORDER — AZITHROMYCIN 250 MG PO TABS: ORAL_TABLET | ORAL | 1 refills | Status: DC

## 2023-09-19 NOTE — Progress Notes (Signed)
LIPID CLINIC CONSULT NOTE  Chief Complaint:  Follow-up  Primary Care Physician: Lucky Cowboy, MD   Primary Cardiologist:  Chrystie Nose, MD  HPI:  Laura Maynard is a 77 y.o. female who is being seen today for the evaluation of dyslipidemia at the request of Lucky Cowboy, MD. This is a pleasant 76 year old female who is kindly referred by Dr. Oneta Rack for evaluation of dyslipidemia.  According to her she has had a longstanding history of dyslipidemia and is been on multiple therapies including the statins (pravastatin, atorvastatin (and ezetimibe, for which she has had significant side effects including myalgias.  Recently she had a lipid profile which showed total cholesterol 269, triglycerides 57, HDL 99 and LDL 155.  I reviewed her chart and noted that she has no history of coronary disease and few cardiovascular risk factors other than age.  She has no history of hypertension, diabetes and no significant premature coronary disease in her family.  She does have hypothyroidism on low-dose levothyroxine.  Chart review indicated she had a CT angios in 2014 which showed no evidence of pulmonary embolism or pulmonary sarcoidosis.  Also there was no mention of coronary calcification.  08/31/2020  Laura Maynard is seen today in follow-up.  Repeat lipids show persistent elevation with total cholesterol 269, triglycerides 102, HDL 53 and LDL 152.  She has not been on therapy for this.  A CT calcium score did show some coronary calcium with a score 43, 64th percentile for age and sex matched control.  Small area of calcification noted in the distal circumflex artery.  On these findings I would recommend therapy.  I had recommended trying bempedoic acid however the cost would have been $68 a month.  This is felt to be too expensive for her.  We then talked about several other options.  One might be a very low-dose of a statin such as rosuvastatin which is very potent.  She seemed agreeable to  this.  12/07/2020  Laura Maynard is seen today in follow-up.  She has been on low-dose Crestor 5 mg every other day however has significant arthralgias/myalgias which are likely related to the rosuvastatin.  She had similar symptoms with pravastatin and ezetimibe in the past.  I do think we will need to consider an alternative.  In the past we had prescribed bempedoic acid and she was approved for that however the cost was an issue.  I would like to revisit that and she may now be eligible for a grant from [employer].  Her recent cholesterol showed total 247, triglycerides 50, HDL 118 and LDL 121.  06/12/2021  Laura Maynard is seen today in follow-up.  She reports she has been compliant with the Repatha although her labs 2 weeks ago showed no significant change.  Her total cholesterol still 240 with LDL 121.  This is quite unusual.  She did describe to me how she injected the medication which sounds correct.  There are few explanations as to why she may not have had improvement in this.  One may be that she has a high proportion of LP(a), the other could be a mutation in PCSK9 however that would be unusual given the fact that her cholesterol is really not all that high however does need to be lower based on the fact she has coronary calcium.  We discussed several other options today.  I propose going to atorvastatin however she was deathly afraid of that based on her reading on the  Internet.  She said she would prefer to go back to low-dose rosuvastatin even though it may have caused her side effects.  She is not really prepared to do injections every 2 weeks indefinitely as she thought the medication would only be necessary for short period of time.  10/03/2021  Laura Maynard returns today for follow-up.  Unfortunately her lipids were not significantly improved from prior studies.  Cholesterol now total is 232, triglycerides 67, HDL 104 and LDL 117.  Her labs from August showed total cholesterol 240, triglycerides  50, HDL 111 and LDL 121.  She reports compliance with rosuvastatin 5 mg every other day and is not having any side effects.  02/15/2022  Laura Maynard returns today for follow-up.  She reports strict compliance with the increased dose of rosuvastatin 20 mg daily.  Unfortunately there is been no significant change in her lipids.  She is quite frustrated in fact was tearful about this today.  Interestingly she had almost no significant improvement with other statins in the past and was also not able to tolerate Repatha although did have some improvement in her lipids.  Labs now show total cholesterol 241, triglycerides 54, HDL 114 and LDL 118, similar to prior testing which showed total cholesterol 232 and LDL 117.  07/19/2022  Laura Maynard returns today for follow-up of her lipids.  She recently was seen for her second injection of Leqvio.  This was postponed due to concern about hypertension.  She had initially seen her primary care provider because of some leg swelling and was noted to be a little hypertensive.  They had indicated that they felt it might be related to [facility] and then she was seen by our pharmacist who decided to postpone her shot and have her follow-up with me.  Blood pressure today however is normal.  When I asked her about her diet she says she has been eating more processed foods and sodium recently which I suspect may be related to her elevated blood pressure.  It is highly unlikely to be related to her Leqvio.  03/21/2023  Laura Maynard is seen today in follow-up.  She states to be tolerating Leqvio.  She has had further improvement in her lipids and only mild injection site redness in her abdomen however tolerated the arm injection better.  She has had a marked reduction in her LDL particle number for 1193 down to 843, LDL-C has reduced from 144 down to 105 and moreover her LP(a) is come down from 411.9 nmol/L to 259 nmol/L.  09/19/2023  Laura Maynard is seen today in follow-up.  She  remains on Leqvio.  She has had very good control of her LDL particles.  LDL particle number was 801, her LDL was going up somewhat to 130 however this is calculated as her HDL is actually higher at 952 and her triglycerides are very low at 43.  Her small LDL particle numbers are still undetectable.  Overall I think this is mathematically demonstrating an increase in LDL however her cholesterol is still very well-controlled.  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   High cholesterol    Hypertension    Hypothyroidism    Migraine    Prediabetes    Sarcoidosis, lung (HCC) 15 YRS AGO   NO TREATMENTS   Toxic multinodular goiter 05/27/2014   Vertigo    Vitamin D deficiency     Past  Surgical History:  Procedure Laterality Date   BACK SURGERY  1970   CATARACT EXTRACTION Left 12/2017   COLONOSCOPY  2006; 03/28/11   2 small adenomas; normal   EYE SURGERY  1964   right eye   KNEE ARTHROSCOPY  2002   NASAL SEPTUM SURGERY  1998   TOTAL ABDOMINAL HYSTERECTOMY  2000    FAMHx:  Family History  Problem Relation Age of Onset   Lung cancer Sister 32   Throat cancer Brother 81   Stroke Mother    Emphysema Father    COPD Sister    Diabetes Brother    Colon cancer Neg Hx     SOCHx:   reports that she has never smoked. She has never used smokeless tobacco. She reports that she does not drink alcohol and does not use drugs.  ALLERGIES:  Allergies  Allergen Reactions   Sulfa Antibiotics Hives   Doxycycline Itching    itching   Gabapentin Nausea And Vomiting   Penicillins Hives   Pravastatin     Myalgia   Rosuvastatin Other (See Comments)    Joint/muscle aches, fatigue   Zetia [Ezetimibe]     Myalgia    ROS: Pertinent items noted in HPI and remainder of comprehensive ROS otherwise negative.  HOME MEDS: Current Outpatient Medications on File Prior to Visit  Medication Sig Dispense Refill    Acetaminophen 325 MG CAPS Take by mouth.     AYR SALINE NASAL NA Place into the nose.     butalbital-acetaminophen-caffeine (FIORICET) 50-325-40 MG tablet TAKE 1 TABLET  EVERY 4 HOURS AS NEEDED FOR SEVERE HEADACHE                                                                               /                                      TAKE                                                   BY                                        MOUTH 30 tablet 0   cholecalciferol (VITAMIN D3) 25 MCG (1000 UNIT) tablet Take 1 tablet (1,000 Units total) by mouth 2 (two) times daily with a meal.     Cyanocobalamin (VITAMIN B 12 PO) Take by mouth.     cycloSPORINE (RESTASIS) 0.05 % ophthalmic emulsion Apply to eye.     inclisiran (LEQVIO) 284 MG/1.5ML SOSY injection Inject 284mg  SQ at month 0 and month 3, then every 6 months 1.5 mL 2   levothyroxine (SYNTHROID) 50 MCG tablet Take 1.5 tablets (75 mcg total) by mouth daily before breakfast. 150 tablet 3   meclizine (ANTIVERT) 25 MG tablet Take      1 tablet  3 x day       if needed for Dizziness /Vertigo 100 tablet 0   metroNIDAZOLE (METROGEL) 1 % gel Apply topically daily. Apply  Daily  to Acne Rosacea rash 60 g 3   Naproxen Sodium (ALEVE PO) Take by mouth. PRN     NEOMYCIN-POLYMYXIN-HYDROCORTISONE (CORTISPORIN) 1 % SOLN OTIC solution Apply  6 to 8 drops to affected ear 3 to 4 x /day 10 mL 3   ondansetron (ZOFRAN) 8 MG tablet Take  1/2 to 1  tablet    3 x /day (every 6 hours)   if Needed for Nausea  or Vomitting 30 tablet 1   dexamethasone (DECADRON) 2 MG tablet Take 1 tab 3 x day for 5 days,  then 2 x day for 5 days, then 1 tab daily for Allergic reaction (Patient not taking: Reported on 09/19/2023) 30 tablet 0   No current facility-administered medications on file prior to visit.    LABS/IMAGING: No results found for this or any previous visit (from the past 48 hours).  No results found.  LIPID PANEL:    Component Value Date/Time   CHOL 217 (H) 04/28/2023 1107    CHOL 241 (H) 02/06/2022 0901   TRIG 94 04/28/2023 1107   HDL 91 04/28/2023 1107   HDL 114 02/06/2022 0901   CHOLHDL 2.4 04/28/2023 1107   VLDL 13 02/04/2017 1550   LDLCALC 107 (H) 04/28/2023 1107    WEIGHTS: Wt Readings from Last 3 Encounters:  09/19/23 164 lb 3.2 oz (74.5 kg)  07/30/23 160 lb 12.8 oz (72.9 kg)  07/22/23 160 lb (72.6 kg)    VITALS: BP 118/66 (BP Location: Left Arm, Patient Position: Sitting, Cuff Size: Normal)   Pulse 83   Ht 5\' 6"  (1.676 m)   Wt 164 lb 3.2 oz (74.5 kg)   SpO2 98%   BMI 26.50 kg/m   EXAM: Deferred  EKG: Deferred  ASSESSMENT: Mixed dyslipidemia, high LDL and HDL, probable genetic dyslipidemia CAC score of 43, 64th percentile (06/2019) Statin intolerance - myalgias Suboptimal lipid lowering with Repatha Elevated LP(a) of 415 nmol/L, reduced to to 59.9 on Leqvio.  PLAN: 1.   Mrs. Eden seems to have persistently good control of her cholesterol on Leqvio.  I have encouraged her to continue to watch diet and continue to be active.  This should be giving her some benefit with her LP(a).  In fact she had come down dramatically with an LP(a) of 259.9.  She might be a candidate for upcoming cardiovascular risk reduction trials for specific LP(a) inhibition.  Plan follow-up with me otherwise annually or sooner as necessary.  Chrystie Nose, MD, [facility], FACP  [place]  [facility]  Medical Director of the Advanced Lipid Disorders &  Cardiovascular Risk Reduction Clinic Diplomate of the American Board of Clinical Lipidology Attending Cardiologist  Direct Dial: [phone]  Fax: [phone]  Website:  [email or web address] Nicely Tahnee Cifuentes 09/19/2023, 9:30 AM

## 2023-09-19 NOTE — Patient Instructions (Signed)
Medication Instructions:  No Changes  *If you need a refill on your cardiac medications before your next appointment, please call your pharmacy*   Lab Work: FASTING lab work to check cholesterol in 1 year  ** complete one week before next appointment    Follow-Up: At [facility] you and your health needs are our priority.  As part of our continuing mission to provide you with exceptional heart care, we have created designated Provider Care Teams.  These Care Teams include your primary Cardiologist (physician) and Advanced Practice Providers (APPs -  Physician Assistants and Nurse Practitioners) who all work together to provide you with the care you need, when you need it.  We recommend signing up for the patient portal called "MyChart".  Sign up information is provided on this After Visit Summary.  MyChart is used to connect with patients for Virtual Visits (Telemedicine).  Patients are able to view lab/test results, encounter notes, upcoming appointments, etc.  Non-urgent messages can be sent to your provider as well.   To learn more about what you can do with MyChart, go to [email or web address].    Your next appointment:    12 months with Dr. Rennis Golden

## 2023-09-25 ENCOUNTER — Telehealth: Payer: Self-pay | Admitting: Nurse Practitioner

## 2023-09-25 NOTE — Telephone Encounter (Signed)
Dr. Oneta Rack gave patient azithromycin. She took the medication correctly the first 2 days. Patient went out of town and forgot the pills and has 3 left. Should she start taking the pills again?

## 2023-09-25 NOTE — Telephone Encounter (Signed)
Patient advised, still feeling "under the weather"

## 2023-09-25 NOTE — Telephone Encounter (Signed)
If she is still symptomatic then I would have her finish the last 3 pills

## 2023-10-03 ENCOUNTER — Ambulatory Visit: Payer: Medicare PPO | Admitting: Gastroenterology

## 2023-10-03 ENCOUNTER — Encounter: Payer: Self-pay | Admitting: Gastroenterology

## 2023-10-03 VITALS — BP 126/76 | HR 83 | Wt 162.0 lb

## 2023-10-03 DIAGNOSIS — R194 Change in bowel habit: Secondary | ICD-10-CM | POA: Diagnosis not present

## 2023-10-03 DIAGNOSIS — Z860101 Personal history of adenomatous and serrated colon polyps: Secondary | ICD-10-CM | POA: Diagnosis not present

## 2023-10-03 NOTE — Patient Instructions (Signed)
 You have been scheduled for a colonoscopy. Please follow written instructions given to you at your visit today.   Please pick up your prep supplies at the pharmacy within the next 1-3 days.  If you use inhalers (even only as needed), please bring them with you on the day of your procedure.  DO NOT TAKE 7 DAYS PRIOR TO TEST- Trulicity (dulaglutide) Ozempic, Wegovy (semaglutide) Mounjaro (tirzepatide) Bydureon Bcise (exanatide extended release)  DO NOT TAKE 1 DAY PRIOR TO YOUR TEST Rybelsus (semaglutide) Adlyxin (lixisenatide) Victoza (liraglutide) Byetta (exanatide) _____________________________________________________________________  _______________________________________________________  If your blood pressure at your visit was 140/90 or greater, please contact your primary care physician to follow up on this.  _______________________________________________________  If you are age 7 or older, your body mass index should be between 23-30. Your Body mass index is 26.15 kg/m. If this is out of the aforementioned range listed, please consider follow up with your Primary Care Provider.  If you are age 51 or younger, your body mass index should be between 19-25. Your Body mass index is 26.15 kg/m. If this is out of the aformentioned range listed, please consider follow up with your Primary Care Provider.   ________________________________________________________  The [facility] providers would like to encourage you to use MYCHART to communicate with providers for non-urgent requests or questions.  Due to long hold times on the telephone, sending your provider a message by [facility] may be a faster and more efficient way to get a response.  Please allow 48 business hours for a response.  Please remember that this is for non-urgent requests.  _______________________________________________________

## 2023-10-03 NOTE — Progress Notes (Signed)
 10/03/2023 Erminio MARLA Molt [id number] 1946/07/19   HISTORY OF PRESENT ILLNESS: This is a 78 year old female who is a patient of Dr. Darilyn, known to him only for colonoscopy in 2006 at which time she had 2 adenomas removed and colonoscopy in 2012 that was normal.  Has not been seen for any other reason since then.  Had a negative Cologuard in 2018, July 2021, and just August 2024.  She is now here today for evaluation of change in bowel habits.  She says that for the past several months her stools just do not seem the same as they used to be.  Sometimes they seem somewhat flat.  She thought that maybe it was due to her injection that she takes for high cholesterol, but I do not see anything in the side effect profile that would cause any issues with her stool.  Denies any blood in her stool.  Had 1 occasion where she just saw a dark area in her stool, the stool was not black at all by any means.  She is on Synthroid  so her TSH is monitored and followed regularly.  Admits that she does not eat the best as she does not cook and just tends to eat things that are easy access.   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 She has never used smokeless tobacco. She reports that she does not drink alcohol and does not use drugs. family history includes COPD in her sister; Diabetes in her brother;  Emphysema in her father; Lung cancer (age of onset: 22) in her sister; Stroke in her mother; Throat cancer (age of onset: 56) in her brother. Allergies  Allergen Reactions   Sulfa Antibiotics Hives   Doxycycline  Itching    itching   Gabapentin  Nausea And Vomiting   Penicillins Hives   Pravastatin     Myalgia   Rosuvastatin  Other (See Comments)    Joint/muscle aches, fatigue   Zetia [Ezetimibe]     Myalgia      Outpatient Encounter Medications as of 10/03/2023  Medication Sig   Acetaminophen 325 MG CAPS Take by mouth.   AYR SALINE NASAL NA Place into the nose.   azithromycin  (ZITHROMAX ) 250 MG tablet Take 2 tablets with Food on  Day 1, then 1 tablet Daily with Food for Sinusitis / Bronchitis   benzonatate  (TESSALON ) 200 MG capsule Take 1 perle 3 x / day to prevent cough   butalbital -acetaminophen-caffeine  (FIORICET) 50-325-40 MG tablet TAKE 1 TABLET  EVERY 4 HOURS AS NEEDED FOR SEVERE HEADACHE                                                                               /  TAKE                                                   BY                                        MOUTH   cholecalciferol (VITAMIN D3) 25 MCG (1000 UNIT) tablet Take 1 tablet (1,000 Units total) by mouth 2 (two) times daily with a meal.   Cyanocobalamin  (VITAMIN B 12 PO) Take by mouth.   cycloSPORINE (RESTASIS) 0.05 % ophthalmic emulsion Apply to eye.   inclisiran (LEQVIO ) 284 MG/1.5ML SOSY injection Inject 284mg  SQ at month 0 and month 3, then every 6 months   levothyroxine  (SYNTHROID ) 50 MCG tablet Take 1.5 tablets (75 mcg total) by mouth daily before breakfast.   meclizine  (ANTIVERT ) 25 MG tablet Take      1 tablet      3 x day       if needed for Dizziness /Vertigo   metroNIDAZOLE  (METROGEL ) 1 % gel Apply topically daily. Apply  Daily  to Acne Rosacea rash   Naproxen Sodium (ALEVE PO) Take by mouth. PRN   NEOMYCIN -POLYMYXIN-HYDROCORTISONE  (CORTISPORIN) 1 % SOLN OTIC solution Apply   6 to 8 drops to affected ear 3 to 4 x /day   ondansetron  (ZOFRAN ) 8 MG tablet Take  1/2 to 1  tablet    3 x /day (every 6 hours)   if Needed for Nausea  or Vomitting   dexamethasone  (DECADRON ) 4 MG tablet Take 1 tab 3 x /day for 2 days,      then 2 x /day for 2  Days,     then 1 tab daily (Patient not taking: Reported on 10/03/2023)   No facility-administered encounter medications on file as of 10/03/2023.     REVIEW OF SYSTEMS  : All other systems reviewed and negative except where noted in the History of Present Illness.   PHYSICAL EXAM: Wt 162 lb (73.5 kg)   BMI 26.15 kg/m  General: Well developed white female in no acute distress Head: Normocephalic and atraumatic Eyes:  Sclerae anicteric, conjunctiva pink. Ears: Normal auditory acuity Lungs: Clear throughout to auscultation; no W/R/R. Heart: Regular rate and rhythm; no M/R/G. Abdomen: Soft, non-distended.  BS present.  Non-tender. Rectal:  Will be done at the time of colonoscopy. Musculoskeletal: Symmetrical with no gross deformities  Skin: No lesions on visible extremities Extremities: No edema  Neurological: Alert oriented x 4, grossly non-focal Psychological:  Alert and cooperative. Normal mood and affect  ASSESSMENT AND PLAN: *Change in bowel habits: Had a colonoscopy in 2006 with 2 adenomas removed, colonoscopy 2012 that was normal and then a negative Cologuard in 2018, 2021, and just in August 2024.  Now over the past several months has had what sounds like just some mild change in bowel habits.  Intermittently stool is flat, just says that her stools are not the same as they used to be.  She would like to proceed with colonoscopy just for reassurance.  Will schedule with Dr. Avram.  I suggested that she could possibly try Benefiber starting with 2 teaspoons mixed in 8 ounces of liquid daily as well to see if this would help better regulate her bowel habits.  CC:  Tonita Fallow, MD

## 2023-10-08 ENCOUNTER — Ambulatory Visit: Payer: Medicare PPO | Admitting: Dermatology

## 2023-10-08 ENCOUNTER — Encounter: Payer: Self-pay | Admitting: Dermatology

## 2023-10-08 VITALS — BP 140/92 | HR 83

## 2023-10-08 DIAGNOSIS — L219 Seborrheic dermatitis, unspecified: Secondary | ICD-10-CM

## 2023-10-08 DIAGNOSIS — L649 Androgenic alopecia, unspecified: Secondary | ICD-10-CM

## 2023-10-08 MED ORDER — SAFETY SEAL MISCELLANEOUS MISC: 0 refills | Status: DC

## 2023-10-08 MED ORDER — CLOBETASOL PROPIONATE 0.05 % EX SOLN: 1.0000 | Freq: Two times a day (BID) | CUTANEOUS | 0 refills | Status: DC

## 2023-10-08 NOTE — Progress Notes (Signed)
   New Patient Visit   Subjective  Laura Maynard is a 78 y.o. female who presents for the following: New Pt - Hair thinning & itchy scalp  Patient states she has itchy scalp & hair thinning located at the scalp that she would like to have examined. Patient reports the areas have been there for 6 months. She reports the areas are bothersome.Patient rates irritation (itching) 8 out of 10. She states that the areas have not spread. Patient reports she has previously been treated for these areas by Dr. Shona about 15mo and was prescribed a medication that would have to sit on her scalp daily for 4 hours, but she is unsure of the name of it.  Patient denies Hx of bx. Patient denies family history of skin cancer(s).   The following portions of the chart were reviewed this encounter and updated as appropriate: medications, allergies, medical history  Review of Systems:  No other skin or systemic complaints except as noted in HPI or Assessment and Plan.  Objective  Well appearing patient in no apparent distress; mood and affect are within normal limits.   A focused examination was performed of the following areas: scalp & hair   Relevant exam findings are noted in the Assessment and Plan.           Assessment & Plan   SEBORRHEIC DERMATITIS Exam: Pink patches with greasy scale at scalp   flared  Seborrheic Dermatitis is a chronic persistent rash characterized by pinkness and scaling most commonly of the mid face but also can occur on the scalp (dandruff), ears; mid chest, mid back and groin.  It tends to be exacerbated by stress and cooler weather.  People who have neurologic disease may experience new onset or exacerbation of existing seborrheic dermatitis.  The condition is not curable but treatable and can be controlled.  Treatment Plan: - Recommended using [employer] Zinc Shampoo. Picture included in AVS. - Rx Clobetasol  0.0.5% scalp solution - use one or two times a day PRN to affected  areas   ANDROGENETIC ALOPECIA (FEMALE PATTERN HAIR LOSS) Exam: Diffuse thinning of the crown and widening of the midline part with retention of the frontal hairline  flared  Female Androgenic Alopecia is a chronic condition related to genetics and/or hormonal changes.  In women androgenetic alopecia is commonly associated with menopause but may occur any time after puberty.  It causes hair thinning primarily on the crown with widening of the part and temporal hairline recession.    Treatment Plan: -  Prescribe topical solution containing minoxidil and finasteride from specialty pharmacy. Instruct patient to apply the solution daily in the morning.   Recommend Viviscal supplements: one tablet in the morning, one at night.   Consider collagen supplements, e.g., Vital Proteins.   Advise trimming hair to manage shedding.   Schedule a follow-up appointment in 4 months to assess progress.   Patient education: Inform the patient to expect visible changes in hair growth in approximately 4 months.    No follow-ups on file.    Documentation: I have reviewed the above documentation for accuracy and completeness, and I agree with the above.  I, Shirron Maranda, CMA, am acting as scribe for [employer], DO.   Delon Lenis, DO

## 2023-10-08 NOTE — Patient Instructions (Addendum)
 Hello Chanae,  Thank you for visiting us  today. We appreciate your commitment to addressing your concerns about your itchy scalp and thinning hair. Here is a summary of the key instructions from today's consultation:  Topical Treatment for Hair Thinning: Start applying the prescribed treatment containing minoxidil and finasteride every morning. This regimen is designed to manage hair thinning effectively without causing unwanted facial hair growth.   Expectation: Anticipate new hair growth within approximately four months.  Supplements: Start taking Viviscal supplements, ensuring they are taken separately from your other medications to prevent interactions. Additionally, consider incorporating collagen supplements like Vital Proteins to support hair growth.  Management of Seborrheic Dermatitis: Utilize clobetasol  drops for flare-ups. For routine management, wash your hair with DHS Zinc shampoo, which is available on [employer]. Use this shampoo before applying your regular shampoo and conditioner.  Hair Care Advice: Regular trimming is recommended to help manage hair shedding. Maintain your usual hair care routine, as shampooing does not contribute to hair loss.  Stress Management: Incorporate stress-reducing activities such as meditation and exercise into your routine, as stress can worsen your symptoms.  Follow-Up Appointment: We will see you again in four months to evaluate the progress of your treatment. We are optimistic about seeing significant improvements by then.  We look forward to witnessing the positive changes at your next visit. If you have any questions or concerns before then, please do not hesitate to contact our office.  Warm regards,  Dr. Delon Lenis,  Dermatology                Important Information  Due to recent changes in healthcare laws, you may see results of your pathology and/or laboratory studies on MyChart before the doctors have had a chance to  review them. We understand that in some cases there may be results that are confusing or concerning to you. Please understand that not all results are received at the same time and often the doctors may need to interpret multiple results in order to provide you with the best plan of care or course of treatment. Therefore, we ask that you please give us  2 business days to thoroughly review all your results before contacting the office for clarification. Should we see a critical lab result, you will be contacted sooner.   If You Need Anything After Your Visit  If you have any questions or concerns for your doctor, please call our main line at [phone] If no one answers, please leave a voicemail as directed and we will return your call as soon as possible. Messages left after 4 pm will be answered the following business day.   You may also send us  a message via MyChart. We typically respond to MyChart messages within 1-2 business days.  For prescription refills, please ask your pharmacy to contact our office. Our fax number is [phone].  If you have an urgent issue when the clinic is closed that cannot wait until the next business day, you can page your doctor at the number below.    Please note that while we do our best to be available for urgent issues outside of office hours, we are not available 24/7.   If you have an urgent issue and are unable to reach us , you may choose to seek medical care at your doctor's office, retail clinic, urgent care center, or emergency room.  If you have a medical emergency, please immediately call 911 or go to the emergency department. In the  event of inclement weather, please call our main line at [phone] for an update on the status of any delays or closures.  Dermatology Medication Tips: Please keep the boxes that topical medications come in in order to help keep track of the instructions about where and how to use these. Pharmacies typically print  the medication instructions only on the boxes and not directly on the medication tubes.   If your medication is too expensive, please contact our office at [phone] or send us  a message through MyChart.   We are unable to tell what your co-pay for medications will be in advance as this is different depending on your insurance coverage. However, we may be able to find a substitute medication at lower cost or fill out paperwork to get insurance to cover a needed medication.   If a prior authorization is required to get your medication covered by your insurance company, please allow us  1-2 business days to complete this process.  Drug prices often vary depending on where the prescription is filled and some pharmacies may offer cheaper prices.  The website www.goodrx.com contains coupons for medications through different pharmacies. The prices here do not account for what the cost may be with help from insurance (it may be cheaper with your insurance), but the website can give you the price if you did not use any insurance.  - You can print the associated coupon and take it with your prescription to the pharmacy.  - You may also stop by our office during regular business hours and pick up a GoodRx coupon card.  - If you need your prescription sent electronically to a different pharmacy, notify our office through [facility] or by phone at [phone]

## 2023-10-15 DIAGNOSIS — Z1231 Encounter for screening mammogram for malignant neoplasm of breast: Secondary | ICD-10-CM | POA: Diagnosis not present

## 2023-11-04 ENCOUNTER — Ambulatory Visit: Payer: Medicare PPO | Admitting: Nurse Practitioner

## 2023-11-04 ENCOUNTER — Other Ambulatory Visit: Payer: Self-pay | Admitting: Internal Medicine

## 2023-11-04 DIAGNOSIS — G44219 Episodic tension-type headache, not intractable: Secondary | ICD-10-CM

## 2023-11-04 DIAGNOSIS — Z79899 Other long term (current) drug therapy: Secondary | ICD-10-CM

## 2023-11-06 ENCOUNTER — Other Ambulatory Visit: Payer: Self-pay | Admitting: Nurse Practitioner

## 2023-11-06 ENCOUNTER — Other Ambulatory Visit: Payer: Self-pay | Admitting: Internal Medicine

## 2023-11-06 DIAGNOSIS — G44219 Episodic tension-type headache, not intractable: Secondary | ICD-10-CM

## 2023-11-06 DIAGNOSIS — Z79899 Other long term (current) drug therapy: Secondary | ICD-10-CM

## 2023-11-06 MED ORDER — BUTALBITAL-APAP-CAFFEINE 50-325-40 MG PO TABS: ORAL_TABLET | ORAL | 0 refills | Status: DC

## 2023-11-20 ENCOUNTER — Encounter: Payer: Medicare PPO | Admitting: Internal Medicine

## 2023-12-05 ENCOUNTER — Encounter: Payer: Self-pay | Admitting: Internal Medicine

## 2023-12-05 ENCOUNTER — Telehealth: Payer: Self-pay | Admitting: Gastroenterology

## 2023-12-05 NOTE — Telephone Encounter (Signed)
 Patient called and stated that her PCP had passed away and she is now scheduled to see a new PCP late in March. Patient is now unsure if she should keep her endo colonoscopy procedure due to her not having blood in her stool anymore. Patient has her endo colonoscopy schedule for this Monday coming up.Patient is requesting a call back to discuss this further. Please advise.

## 2023-12-05 NOTE — Telephone Encounter (Signed)
 I spoke with the pt and she discussed whether she should keep the appt for procedure as planned.  She has not had any further bleeding. I have advised that it is her choice to keep the appt or not but we certainly will see her as planned if she chooses. She will decide and call back if she decides to cancel.

## 2023-12-11 ENCOUNTER — Ambulatory Visit: Payer: Medicare PPO

## 2023-12-11 ENCOUNTER — Telehealth: Payer: Self-pay | Admitting: *Deleted

## 2023-12-11 NOTE — Telephone Encounter (Signed)
 Pt scheduled for PV but had an OV in Feb that she was given PV instructions. Pt has instructions and RN reviewed with pt to make sure she understood them and gave her the new dates and times.  New copy with new dates to be sent to pt via mail..No other questions.

## 2023-12-15 ENCOUNTER — Telehealth: Payer: Self-pay | Admitting: Internal Medicine

## 2023-12-15 DIAGNOSIS — E785 Hyperlipidemia, unspecified: Secondary | ICD-10-CM

## 2023-12-15 NOTE — Telephone Encounter (Signed)
 Patient calling to get lab request mailed out to her. Please advise

## 2023-12-15 NOTE — Telephone Encounter (Signed)
 Patient identification verified by 2 forms. Marilynn Rail, RN    Called and spoke to patient  Patient states:   -she typically completes labs before infusion   -has infusion scheduled for 4/23  Informed patient:  -per 12/20 OV note repeat labs in 1 year fasting a f/u in 1 year   -lab printed and placed in Mail, complete in 08/2024 Patient verbalized understanding, no questions at this time

## 2023-12-18 ENCOUNTER — Ambulatory Visit: Payer: Self-pay | Admitting: Family Medicine

## 2023-12-18 VITALS — BP 136/82 | HR 74 | Temp 97.5°F | Ht 66.0 in | Wt 164.2 lb

## 2023-12-18 DIAGNOSIS — E782 Mixed hyperlipidemia: Secondary | ICD-10-CM

## 2023-12-18 DIAGNOSIS — L219 Seborrheic dermatitis, unspecified: Secondary | ICD-10-CM | POA: Insufficient documentation

## 2023-12-18 DIAGNOSIS — T466X5A Adverse effect of antihyperlipidemic and antiarteriosclerotic drugs, initial encounter: Secondary | ICD-10-CM

## 2023-12-18 DIAGNOSIS — Z7689 Persons encountering health services in other specified circumstances: Secondary | ICD-10-CM

## 2023-12-18 DIAGNOSIS — G43009 Migraine without aura, not intractable, without status migrainosus: Secondary | ICD-10-CM

## 2023-12-18 DIAGNOSIS — R198 Other specified symptoms and signs involving the digestive system and abdomen: Secondary | ICD-10-CM | POA: Insufficient documentation

## 2023-12-18 DIAGNOSIS — E559 Vitamin D deficiency, unspecified: Secondary | ICD-10-CM

## 2023-12-18 DIAGNOSIS — R0989 Other specified symptoms and signs involving the circulatory and respiratory systems: Secondary | ICD-10-CM

## 2023-12-18 DIAGNOSIS — H5410 Blindness, one eye, low vision other eye, unspecified eyes: Secondary | ICD-10-CM

## 2023-12-18 DIAGNOSIS — L659 Nonscarring hair loss, unspecified: Secondary | ICD-10-CM

## 2023-12-18 DIAGNOSIS — E890 Postprocedural hypothyroidism: Secondary | ICD-10-CM

## 2023-12-18 DIAGNOSIS — G720 Drug-induced myopathy: Secondary | ICD-10-CM | POA: Diagnosis not present

## 2023-12-18 DIAGNOSIS — H8113 Benign paroxysmal vertigo, bilateral: Secondary | ICD-10-CM

## 2023-12-18 NOTE — Telephone Encounter (Signed)
 Patient called stated she has not received any new instructions in the mail.

## 2023-12-18 NOTE — Telephone Encounter (Signed)
 VM left for patient. New copy printed to be mailed to address on file. PT advised she can pick up hard copy from 2nd floor lobby if she prefers. address given.

## 2023-12-18 NOTE — Patient Instructions (Signed)
 VISIT SUMMARY:  Laura Maynard, a 78 year old female, visited to establish primary care after the loss of her previous doctor. She discussed her concerns about an upcoming colonoscopy, her current treatments for hyperlipidemia, hypothyroidism, migraines, dry eyes, and vitamin D deficiency, as well as her fluctuating blood pressure. The doctor reviewed her medical history and provided a plan to address her gastrointestinal symptoms, monitor her blood pressure, and continue her current treatments.  YOUR PLAN:  -GASTROINTESTINAL SYMPTOMS: You have been experiencing changes in your bowel movements and stool consistency. Given your history and concerns, it is important to proceed with the scheduled colonoscopy on January 05, 2024, to thoroughly evaluate these symptoms and rule out any potential issues.  -HYPERLIPIDEMIA: Hyperlipidemia means having high levels of fats (lipids) in your blood. You are currently receiving Inclisiran Wilber Bihari) injections every six months, as prescribed by your cardiologist. Continue with these injections and monitor for any side effects. If you have ongoing concerns, discuss them with your cardiologist.  -HYPERTENSION: Hypertension is high blood pressure. Your blood pressure readings have been fluctuating, but today's reading was 136/82 mmHg. Continue to monitor your blood pressure regularly, and we will evaluate the need for medication if the fluctuations persist.  -HYPOTHYROIDISM: Hypothyroidism is when your thyroid gland doesn't produce enough thyroid hormone. You are taking levothyroxine 75 mcg daily, and you should continue this medication as prescribed.  -MIGRAINE HEADACHES: Migraines are severe headaches often accompanied by nausea and sensitivity to light. You experience these headaches about once every three months and take Fioricet as needed. Continue using Fioricet when you feel a migraine coming on, and let us know if the frequency or severity of your migraines  increases.  -DRY EYES: Dry eyes occur when your eyes don't produce enough tears. You are using cyclosporine drops to manage this condition. Continue using the drops as prescribed.  -VITAMIN D DEFICIENCY: Vitamin D deficiency means you have lower than normal levels of vitamin D. You are managing this with supplements, and you should continue taking them as directed.  -GENERAL HEALTH MAINTENANCE: We will request your mammogram results from [facility] and schedule your annual physical after addressing your gastrointestinal issues, likely in May 2025. Preventive care is important, but we need to prioritize your current health concerns first.  INSTRUCTIONS:  Please proceed with your scheduled colonoscopy on January 05, 2024. Continue monitoring your blood pressure regularly. We will request your mammogram results and schedule your annual physical after your gastrointestinal issues are addressed. Schedule a follow-up appointment after your colonoscopy and cholesterol infusion. Ensure that your past medical records are integrated and coordinate with your specialists for continuity of care.

## 2023-12-18 NOTE — Progress Notes (Signed)
 Assessment/Plan:   Gastrointestinal symptoms She reports changes in bowel movements and stool consistency without visible blood. She has a history of negative Cologuard tests and a poorly tolerated colonoscopy in 2012 due to vomiting. The upcoming colonoscopy is intended to evaluate these gastrointestinal changes rather than solely for colon cancer screening. She expressed concerns about the procedure and previous adverse experience with the preparation. While Cologuard is for colon cancer screening, colonoscopy can evaluate other intestinal issues. Alternatives like blood and stool tests exist, but colonoscopy is comprehensive for gastrointestinal evaluation. - Proceed with scheduled colonoscopy on January 05, 2024 to evaluate gastrointestinal symptoms.  Hyperlipidemia She is on Inclisiran (Leqvio) injections every six months for hyperlipidemia, managed by her cardiologist. She expresses concerns about potential side effects, including hair thinning and changes in bowel movements, although her cardiologist does not attribute these symptoms to the medication. - Continue Inclisiran (Leqvio) injections as scheduled. - Monitor for any adverse effects and discuss with cardiologist if concerns persist.  Hypertension She reports fluctuating blood pressure readings and is concerned about the variability. Blood pressure today is 136/82 mmHg. She is not currently on antihypertensive medication as it has not been deemed necessary due to the fluctuations. Addressing one health issue well can sometimes improve others. - Monitor blood pressure regularly. - Evaluate the need for antihypertensive medication if fluctuations persist.  Hypothyroidism She is on levothyroxine 75 mcg daily for hypothyroidism and reports taking it regularly. - Continue levothyroxine 75 mcg daily.  Migraine headaches She experiences migraines approximately once every 30 months, with severe episodes requiring bed rest and avoidance of  light. She uses Fioricet as needed, which is effective if taken early, but less so if the migraine wakes her at night. - Continue Fioricet as needed for migraines. - Consider further evaluation if migraine frequency or severity increases.  Dry eyes She uses cyclosporine drops for dry eyes, which is a chronic condition managed with current treatment. - Continue cyclosporine drops for dry eyes.  Vitamin D deficiency She has vitamin D deficiency, managed with supplementation. She reports improvement in levels but acknowledges it is an ongoing issue. - Continue vitamin D supplementation.  General Health Maintenance She is concerned about missing her annual wellness visit and physical. She is awaiting mammogram results from 2025, which have not yet been received. Emphasized the importance of preventive care but prioritized addressing current gastrointestinal issues first. - Request mammogram results from [facility]. - Schedule annual physical after addressing gastrointestinal issues, potentially in May 2025.  Follow-up She is transitioning care from  her previous primary care provider to the current practice. Plans to integrate past medical records and coordinate care with specialists. Emphasized the importance of coordinating care and addressing current health issues before focusing on preventive measures. - Schedule follow-up appointment after colonoscopy and cholesterol infusion. - Coordinate with specialists to integrate medical records and ensure continuity of care.         Medications Discontinued During This Encounter  Medication Reason   benzonatate (TESSALON) 200 MG capsule    dexamethasone (DECADRON) 4 MG tablet    azithromycin (ZITHROMAX) 250 MG tablet    ondansetron (ZOFRAN) 8 MG tablet    Safety Seal Miscellaneous MISC     Return in about 2 months (around 02/17/2024) for physical (fasting labs).    Subjective:   Encounter date: 12/18/2023  Laura Maynard is a 78 y.o.  female who has Labile hypertension; History of prediabetes; Vitamin D deficiency; Hyperlipidemia, mixed; Poor compliance with medication; Benign paroxysmal positional  vertigo; TMJ (temporomandibular joint syndrome); Postablative hypothyroidism; BMI 27.0-27.9,adult; Medication Phobia; Multinodular goiter; FH: hypertension; Abnormal glucose; Statin myopathy; Migraine without aura and without status migrainosus, not intractable; Insomnia; Right sided sciatica; Sialectasis; Seasonal allergic conjunctivitis; Pseudophakia of left eye; Dry eye syndrome, bilateral; Cataract, nuclear sclerotic, left eye; Cataract cortical, senile, left; Blood in urine; Aphakia, right eye; Osteoarthritis of knees, bilateral; Change in bowel habits; Seborrheic dermatitis of scalp; Alopecia; Blindness, one eye, low vision other eye, unspecified eyes; and Gastrointestinal complaint on their problem list..   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   She presents with chief complaint of Establish Care .   Discussed the use of AI scribe software for clinical note transcription with the patient, who gave verbal consent to proceed.  History of Present Illness   Laura Maynard is a 78 year old female who presents for establishment of primary care.  She is transitioning care following the loss of her previous doctor and is seeking to establish care with a new primary care physician. She manages her thyroid condition with a specialist and receives treatment for her cholesterol and eye issues from other specialists. She has only sight in one eye.  She is concerned about an upcoming colonoscopy scheduled for April 7th. Her last colonoscopy in 2012 was difficult due to the preparation process causing vomiting.  She has had two negative Cologuard tests since then but is experiencing changes in her bowel movements, though no blood is present in her stool.  She receives Inclisiran injections every six months for hyperlipidemia, prescribed by her cardiologist. She reports thinning hair and changes in her stool, questioning if these symptoms are related to the Inclisiran injections.  She takes levothyroxine 75 mcg daily for hypothyroidism and is compliant with her medication regimen.  She experiences migraine headaches approximately once every three months. When a migraine is imminent, she takes Fioricet and lies down, which usually helps. However, if a migraine occurs at night, she often vomits after taking Fioricet and requires multiple doses before the headache resolves.  She reports fluctuating blood pressure, which has been monitored but not treated with medication due to its variability. She is concerned about the fluctuations and monitors it regularly.  She has a history of dry eyes for which she uses cyclosporine drops. She also experiences vertigo and takes Meclizine as needed.  She has a history of vitamin D and B12 deficiencies, which she manages with supplements.  She has been living in [place] since 1970, is retired, and has a small family with no children. 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 She lives near [address] and is coordinating her medical care following the loss of her primary care physician.         Past Surgical History:  Procedure Laterality Date   BACK SURGERY  1970   CATARACT EXTRACTION Left 12/2017   COLONOSCOPY  2006; 03/28/11   2 small adenomas; normal   EYE SURGERY  1964   right eye   KNEE ARTHROSCOPY  2002   NASAL SEPTUM SURGERY  1998   TOTAL ABDOMINAL HYSTERECTOMY  2000    Outpatient Medications Prior to Visit  Medication Sig Dispense Refill   Acetaminophen 325 MG CAPS Take by mouth.     AYR SALINE NASAL NA Place into  the  nose.     butalbital-acetaminophen-caffeine (FIORICET) 50-325-40 MG tablet TAKE 1 TABLET  EVERY 4 HOURS AS NEEDED FOR SEVERE HEADACHE                                                                               /                                      TAKE                                                   BY                                        MOUTH 30 tablet 0   cholecalciferol (VITAMIN D3) 25 MCG (1000 UNIT) tablet Take 1 tablet (1,000 Units total) by mouth 2 (two) times daily with a meal.     clobetasol (TEMOVATE) 0.05 % external solution Apply 1 Application topically 2 (two) times daily. use one or two times a day PRN to affected areas 50 mL 0   Cyanocobalamin (VITAMIN B 12 PO) Take by mouth.     cycloSPORINE (RESTASIS) 0.05 % ophthalmic emulsion Apply to eye.     inclisiran (LEQVIO) 284 MG/1.5ML SOSY injection Inject 284mg  SQ at month 0 and month 3, then every 6 months 1.5 mL 2   levothyroxine (SYNTHROID) 50 MCG tablet Take 1.5 tablets (75 mcg total) by mouth daily before breakfast. 150 tablet 3   meclizine (ANTIVERT) 25 MG tablet Take      1 tablet      3 x day       if needed for Dizziness /Vertigo 100 tablet 0   metroNIDAZOLE (METROGEL) 1 % gel Apply topically daily. Apply  Daily  to Acne Rosacea rash 60 g 3   Naproxen Sodium (ALEVE PO) Take by mouth. PRN     NEOMYCIN-POLYMYXIN-HYDROCORTISONE (CORTISPORIN) 1 % SOLN OTIC solution Apply  6 to 8 drops to affected ear 3 to 4 x /day 10 mL 3   azithromycin (ZITHROMAX) 250 MG tablet Take 2 tablets with Food on  Day 1, then 1 tablet Daily with Food for Sinusitis / Bronchitis (Patient not taking: Reported on 12/18/2023) 6 each 1   benzonatate (TESSALON) 200 MG capsule Take 1 perle 3 x / day to prevent cough (Patient not taking: Reported on 12/18/2023) 30 capsule 1   dexamethasone (DECADRON) 4 MG tablet Take 1 tab 3 x /day for 2 days,      then 2 x /day for 2  Days,     then 1 tab daily (Patient not taking: Reported on 12/18/2023) 13 tablet 0    ondansetron (ZOFRAN) 8 MG tablet Take  1/2 to 1  tablet    3 x /day (every 6 hours)   if Needed for Nausea  or Vomitting (Patient not taking: Reported  on 12/18/2023) 30 tablet 1   Safety Seal Miscellaneous MISC Brogaine Solution with minoxidil USP 7% and finasteride USP 0.1% Use a few drops in the morning every day (Patient not taking: Reported on 12/18/2023) 30 mL 0   No facility-administered medications prior to visit.    Family History  Problem Relation Age of Onset   Lung cancer Sister 63   Throat cancer Brother 25   Stroke Mother    Emphysema Father    COPD Sister    Diabetes Brother    Colon cancer Neg Hx     Social History   Socioeconomic History   Marital status: Divorced    Spouse name: Not on file   Number of children: 0   Years of education: college   Highest education level: Not on file  Occupational History    Comment: Retired  Tobacco Use   Smoking status: Never   Smokeless tobacco: Never  Vaping Use   Vaping status: Never Used  Substance and Sexual Activity   Alcohol use: Never    Alcohol/week: 1.0 standard drink of alcohol    Types: 1 Standard drinks or equivalent per week    Comment: RARE   Drug use: Never   Sexual activity: Not Currently  Other Topics Concern   Not on file  Social History Narrative   Patient is retired and lives at home alone. Patient  has college education.   Caffeine- 3 cups of caffeine daily.  One soda daily.   Right handed.   Social Drivers of Corporate investment banker Strain: Not on file  Food Insecurity: Not on file  Transportation Needs: Not on file  Physical Activity: Not on file  Stress: Not on file  Social Connections: Not on file  Intimate Partner Violence: Not on file                                                                                                  Objective:  Physical Exam: BP 136/82   Pulse 74   Temp (!) 97.5 F (36.4 C) (Temporal)   Ht 5\' 6"  (1.676 m)   Wt 164 lb 3.2 oz (74.5 kg)    SpO2 97%   BMI 26.50 kg/m     Physical Exam Constitutional:      General: She is not in acute distress.    Appearance: Normal appearance. She is not ill-appearing or toxic-appearing.  HENT:     Head: Normocephalic and atraumatic.     Comments: Generalized thinning of hair and scaling dry skin of scalp    Nose: Nose normal. No congestion.  Eyes:     General: No scleral icterus.    Extraocular Movements: Extraocular movements intact.  Cardiovascular:     Rate and Rhythm: Normal rate and regular rhythm.     Pulses: Normal pulses.     Heart sounds: Normal heart sounds.  Pulmonary:     Effort: Pulmonary effort is normal. No respiratory distress.     Breath sounds: Normal breath sounds.  Abdominal:     General: Abdomen is flat. Bowel sounds are normal.  Palpations: Abdomen is soft.  Musculoskeletal:        General: Normal range of motion.  Lymphadenopathy:     Cervical: No cervical adenopathy.  Skin:    General: Skin is warm and dry.     Findings: No rash.  Neurological:     General: No focal deficit present.     Mental Status: She is alert and oriented to person, place, and time. Mental status is at baseline.  Psychiatric:        Mood and Affect: Mood normal.        Behavior: Behavior normal.        Thought Content: Thought content normal.        Judgment: Judgment normal.     No results found.  No results found for this or any previous visit (from the past 2160 hours).      Garner Nash, MD, MS

## 2023-12-26 ENCOUNTER — Encounter: Payer: Self-pay | Admitting: Internal Medicine

## 2024-01-04 NOTE — Progress Notes (Unsigned)
  Gastroenterology History and Physical   Primary Care Physician:  Garnette Gunner, MD   Reason for Procedure:   Change in bowel habits  Plan:    colonoscopy     HPI: Laura Maynard is a 78 y.o. female w/ hx of colonoscopy in 2006 at which time she had 2 adenomas removed and colonoscopy in 2012 that was normal. Has not been seen for any other reason since then until now. Had a negative Cologuard in 2018, July 2021, and just August 2024. She is now here today for evaluation of change in bowel habits. Seen in 1/25 (Zehr, PA-C) for change in bowel habits and colonoscopy was scheduled. Stools were described as flat. Benefiber recommended.  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 Authorizing Provider  Acetaminophen 325 MG CAPS Take by mouth.    [provider]  AYR SALINE NASAL NA Place into the nose.    [provider]  butalbital-acetaminophen-caffeine (FIORICET) 50-325-40 MG tablet TAKE 1 TABLET  EVERY 4 HOURS AS NEEDED FOR SEVERE HEADACHE                                                                               /                                      TAKE                                                   BY                                        MOUTH 11/06/23   Adela Glimpse, NP  cholecalciferol (VITAMIN D3)  25 MCG (1000 UNIT) tablet Take 1 tablet (1,000 Units total) by mouth 2 (two) times daily with a meal. 08/01/21   Judd Gaudier, NP  clobetasol (TEMOVATE) 0.05 % external solution Apply 1 Application topically 2 (two) times daily. use one or two times a day PRN to affected areas 10/08/23   Terri Piedra,  DO  Cyanocobalamin (VITAMIN B 12 PO) Take by mouth.    [provider]  cycloSPORINE (RESTASIS) 0.05 % ophthalmic emulsion Apply to eye. 05/28/22   [provider]  inclisiran (LEQVIO) 284 MG/1.5ML SOSY injection Inject 284mg  SQ at month 0 and month 3, then every 6 months 10/16/22   Hilty, Lisette Abu, MD  levothyroxine (SYNTHROID) 50 MCG tablet Take 1.5 tablets (75 mcg total) by mouth daily before breakfast. 05/28/23   Carlus Pavlov, MD  meclizine (ANTIVERT) 25 MG tablet Take      1 tablet      3 x day       if needed for Dizziness Tristan Schroeder 09/26/20   Lucky Cowboy, MD  metroNIDAZOLE (METROGEL) 1 % gel Apply topically daily. Apply  Daily  to Acne Rosacea rash 01/27/23   Lucky Cowboy, MD  Naproxen Sodium (ALEVE PO) Take by mouth. PRN    [provider]  NEOMYCIN-POLYMYXIN-HYDROCORTISONE (CORTISPORIN) 1 % SOLN OTIC solution Apply  6 to 8 drops to affected ear 3 to 4 x /day 01/28/23   Lucky Cowboy, MD    Current Outpatient Medications  Medication Sig Dispense Refill   Acetaminophen 325 MG CAPS Take by mouth.     butalbital-acetaminophen-caffeine (FIORICET) 50-325-40 MG tablet TAKE 1 TABLET  EVERY 4 HOURS AS NEEDED FOR SEVERE HEADACHE                                                                               /                                      TAKE                                                   BY                                        MOUTH 30 tablet 0   cholecalciferol (VITAMIN D3) 25 MCG (1000 UNIT) tablet Take 1 tablet (1,000 Units total) by mouth 2 (two) times daily with a meal.     cycloSPORINE (RESTASIS) 0.05 % ophthalmic emulsion Apply to eye.      levothyroxine (SYNTHROID) 50 MCG tablet Take 1.5 tablets (75 mcg total) by mouth daily before breakfast. 150 tablet 3   AYR SALINE NASAL NA Place into the nose.     clobetasol (TEMOVATE) 0.05 % external solution Apply 1 Application topically 2 (two) times daily. use one or two times a day PRN to affected areas 50 mL 0   Cyanocobalamin (VITAMIN B 12 PO) Take by mouth.     inclisiran (LEQVIO) 284 MG/1.5ML SOSY injection Inject 284mg  SQ at month 0 and month 3, then every 6 months 1.5 mL 2   meclizine (ANTIVERT) 25 MG tablet Take      1 tablet      3  x day       if needed for Dizziness /Vertigo 100 tablet 0   metroNIDAZOLE (METROGEL) 1 % gel Apply topically daily. Apply  Daily  to Acne Rosacea rash 60 g 3   Naproxen Sodium (ALEVE PO) Take by mouth. PRN     NEOMYCIN-POLYMYXIN-HYDROCORTISONE (CORTISPORIN) 1 % SOLN OTIC solution Apply  6 to 8 drops to affected ear 3 to 4 x /day 10 mL 3   Current Facility-Administered Medications  Medication Dose Route Frequency Provider Last Rate Last Admin   0.9 %  sodium chloride infusion  500 mL Intravenous Once Iva Boop, MD        Allergies as of 01/05/2024 - Review Complete 01/05/2024  Allergen Reaction Noted   Penicillins Hives 03/18/2011   Sulfa antibiotics Hives 03/18/2011   Zetia [ezetimibe]  10/26/2013   Doxycycline Itching 04/28/2019   Gabapentin Nausea And Vomiting 08/31/2020   Pravastatin Other (See Comments) 10/26/2013   Rosuvastatin Other (See Comments) 12/07/2020    Family History  Problem Relation Age of Onset   Lung cancer Sister 3   Throat cancer Brother 14   Stroke Mother    Emphysema Father    COPD Sister    Diabetes Brother    Colon cancer Neg Hx     Social History   Socioeconomic History   Marital status: Divorced    Spouse name: Not on file   Number of children: 0   Years of education: college   Highest education level: Not on file  Occupational History    Comment: Retired  Tobacco Use   Smoking status: Never    Smokeless tobacco: Never  Vaping Use   Vaping status: Never Used  Substance and Sexual Activity   Alcohol use: Never    Alcohol/week: 1.0 standard drink of alcohol    Types: 1 Standard drinks or equivalent per week    Comment: RARE   Drug use: Never   Sexual activity: Not Currently  Other Topics Concern   Not on file  Social History Narrative   Patient is retired and lives at home alone. Patient  has college education.   Caffeine- 3 cups of caffeine daily.  One soda daily.   Right handed.   Social Drivers of Corporate investment banker Strain: Not on file  Food Insecurity: Not on file  Transportation Needs: Not on file  Physical Activity: Not on file  Stress: Not on file  Social Connections: Not on file  Intimate Partner Violence: Not on file    Review of Systems:  All other review of systems negative except as mentioned in the HPI.  Physical Exam: Vital signs BP (!) 148/84   Pulse 88   Temp 98.8 F (37.1 C) (Temporal)   Ht 5\' 6"  (1.676 m)   Wt 162 lb (73.5 kg)   SpO2 97%   BMI 26.15 kg/m   General:   Alert,  Well-developed, well-nourished, pleasant and cooperative in NAD Lungs:  Clear throughout to auscultation.   Heart:  Regular rate and rhythm; no murmurs, clicks, rubs,  or gallops. Abdomen:  Soft, nontender and nondistended. Normal bowel sounds.   Neuro/Psych:  Alert and cooperative. Normal mood and affect. A and O x 3   @Zaniah Titterington  Sena Slate, MD, [facility] [phone] (pager) 01/05/2024 9:33 AM@

## 2024-01-05 ENCOUNTER — Ambulatory Visit (AMBULATORY_SURGERY_CENTER): Payer: Medicare PPO | Admitting: Internal Medicine

## 2024-01-05 ENCOUNTER — Encounter: Payer: Self-pay | Admitting: Internal Medicine

## 2024-01-05 ENCOUNTER — Encounter: Payer: Medicare PPO | Admitting: Internal Medicine

## 2024-01-05 VITALS — BP 126/72 | HR 79 | Temp 98.8°F | Resp 12 | Ht 66.0 in | Wt 162.0 lb

## 2024-01-05 DIAGNOSIS — R194 Change in bowel habit: Secondary | ICD-10-CM

## 2024-01-05 DIAGNOSIS — K648 Other hemorrhoids: Secondary | ICD-10-CM | POA: Diagnosis not present

## 2024-01-05 MED ORDER — SODIUM CHLORIDE 0.9 % IV SOLN
500.0000 mL | Freq: Once | INTRAVENOUS | Status: DC
Start: 2024-01-05 — End: 2024-01-05

## 2024-01-05 NOTE — Progress Notes (Signed)
 Pt's states no medical or surgical changes since previsit or office visit.

## 2024-01-05 NOTE — Op Note (Signed)
 [facility] Patient Name: Laura Maynard Procedure Date: 01/05/2024 9:22 AM MRN: [id number] Endoscopist: Iva Boop , MD, [id number] Age: 78 Referring MD:  Date of Birth: [birth year] Gender: Female Account #: [id number] Procedure:                Colonoscopy Indications:              Change in bowel habits Medicines:                Monitored Anesthesia Care Procedure:                Pre-Anesthesia Assessment:                           - Prior to the procedure, a History and Physical                            was performed, and patient medications and                            allergies were reviewed. The patient's tolerance of                            previous anesthesia was also reviewed. The risks                            and benefits of the procedure and the sedation                            options and risks were discussed with the patient.                            All questions were answered, and informed consent                            was obtained. Prior Anticoagulants: The patient has                            taken no anticoagulant or antiplatelet agents. ASA                            Grade Assessment: II - A patient with mild systemic                            disease. After reviewing the risks and benefits,                            the patient was deemed in satisfactory condition to                            undergo the procedure.                           After obtaining informed consent, the colonoscope  was passed under direct vision. Throughout the                            procedure, the patient's blood pressure, pulse, and                            oxygen saturations were monitored continuously. The                            Olympus Scope SN [id number] was introduced through the                            anus and advanced to the the cecum, identified by                            appendiceal orifice and ileocecal  valve. The                            colonoscopy was performed without difficulty. The                            patient tolerated the procedure well. The quality                            of the bowel preparation was adequate. The                            ileocecal valve, appendiceal orifice, and rectum                            were photographed. The bowel preparation used was                            Miralax via split dose instruction. Scope In: 9:42:20 AM Scope Out: 9:59:07 AM Scope Withdrawal Time: 0 hours 10 minutes 31 seconds  Total Procedure Duration: 0 hours 16 minutes 47 seconds  Findings:                 The perianal and digital rectal examinations were                            normal.                           Internal hemorrhoids were found. The hemorrhoids                            were small.                           The exam was otherwise without abnormality on                            direct and retroflexion views. Complications:            No immediate complications. Estimated  Blood Loss:     Estimated blood loss: none. Impression:               - Internal hemorrhoids.                           - The examination was otherwise normal on direct                            and retroflexion views.                           - No specimens collected.                           - remote hx 2 small adenomas 2006 Recommendation:           - Patient has a contact number available for                            emergencies. The signs and symptoms of potential                            delayed complications were discussed with the                            patient. Return to normal activities tomorrow.                            Written discharge instructions were provided to the                            patient.                           - High fiber diet.                           - No repeat colonoscopy due to age and the absence                            of  colonic polyps. Iva Boop, MD 01/05/2024 10:05:01 AM This report has been signed electronically.

## 2024-01-05 NOTE — Patient Instructions (Addendum)
 No polyps or cancer were seen.  Please try a higher fiber diet.  Happy Birthday on Wednesday!  I appreciate the opportunity to care for you. Iva Boop, MD, FACG  YOU HAD AN ENDOSCOPIC PROCEDURE TODAY AT THE [facility]:   Refer to the procedure report that was given to you for any specific questions about what was found during the examination.  If the procedure report does not answer your questions, please call your gastroenterologist to clarify.  If you requested that your care partner not be given the details of your procedure findings, then the procedure report has been included in a sealed envelope for you to review at your convenience later.  YOU SHOULD EXPECT: Some feelings of bloating in the abdomen. Passage of more gas than usual.  Walking can help get rid of the air that was put into your GI tract during the procedure and reduce the bloating. If you had a lower endoscopy (such as a colonoscopy or flexible sigmoidoscopy) you may notice spotting of blood in your stool or on the toilet paper. If you underwent a bowel prep for your procedure, you may not have a normal bowel movement for a few days.  Please Note:  You might notice some irritation and congestion in your nose or some drainage.  This is from the oxygen used during your procedure.  There is no need for concern and it should clear up in a day or so.  SYMPTOMS TO REPORT IMMEDIATELY:  Following lower endoscopy (colonoscopy or flexible sigmoidoscopy):  Excessive amounts of blood in the stool  Significant tenderness or worsening of abdominal pains  Swelling of the abdomen that is new, acute  Fever of 100F or higher  For urgent or emergent issues, a gastroenterologist can be reached at any hour by calling [phone]. Do not use MyChart messaging for urgent concerns.    DIET:  We do recommend a small meal at first, but then you may proceed to your regular diet.  Drink plenty of fluids but you should avoid  alcoholic beverages for 24 hours.  ACTIVITY:  You should plan to take it easy for the rest of today and you should NOT DRIVE or use heavy machinery until tomorrow (because of the sedation medicines used during the test).    FOLLOW UP: Our staff will call the number listed on your records the next business day following your procedure.  We will call around 7:15- 8:00 am to check on you and address any questions or concerns that you may have regarding the information given to you following your procedure. If we do not reach you, we will leave a message.     If any biopsies were taken you will be contacted by phone or by letter within the next 1-3 weeks.  Please call us at [phone] if you have not heard about the biopsies in 3 weeks.    SIGNATURES/CONFIDENTIALITY: You and/or your care partner have signed paperwork which will be entered into your electronic medical record.  These signatures attest to the fact that that the information above on your After Visit Summary has been reviewed and is understood.  Full responsibility of the confidentiality of this discharge information lies with you and/or your care-partner.

## 2024-01-05 NOTE — Progress Notes (Signed)
 To pacu, VSS. Report to [email or web address]

## 2024-01-06 ENCOUNTER — Telehealth: Payer: Self-pay

## 2024-01-06 NOTE — Telephone Encounter (Signed)
  Follow up Call-     01/05/2024    9:06 AM  Call back number  Post procedure Call Back phone  # [phone]  Permission to leave phone message Yes     Patient questions:  Do you have a fever, pain , or abdominal swelling? No. Pain Score  0 *  Have you tolerated food without any problems? Yes.    Have you been able to return to your normal activities? Yes.    Do you have any questions about your discharge instructions: Diet   No. Medications  No. Follow up visit  No.  Do you have questions or concerns about your Care? No.  Actions: * If pain score is 4 or above: No action needed, pain <4.

## 2024-01-21 ENCOUNTER — Ambulatory Visit (INDEPENDENT_AMBULATORY_CARE_PROVIDER_SITE_OTHER): Payer: Medicare PPO

## 2024-01-21 VITALS — BP 127/80 | HR 71 | Temp 99.1°F | Resp 18 | Ht 67.0 in | Wt 162.8 lb

## 2024-01-21 DIAGNOSIS — E782 Mixed hyperlipidemia: Secondary | ICD-10-CM

## 2024-01-21 MED ORDER — INCLISIRAN SODIUM 284 MG/1.5ML ~~LOC~~ SOSY
284.0000 mg | PREFILLED_SYRINGE | Freq: Once | SUBCUTANEOUS | Status: AC
  Administered 2024-01-21: 284 mg via SUBCUTANEOUS
  Filled 2024-01-21: qty 1.5

## 2024-01-21 NOTE — Progress Notes (Signed)
 Diagnosis: Hyperlipidemia  Provider:  Chilton Greathouse MD  Procedure: Injection  Leqvio (inclisiran), Dose: 284 mg, Site: subcutaneous, Number of injections: 1  Injection Site(s): Left arm  Post Care:  left arm injection  Discharge: Condition: Good, Destination: Home . AVS Provided  Performed by:  Rico Ala, LPN

## 2024-02-04 ENCOUNTER — Encounter: Payer: Medicare PPO | Admitting: Internal Medicine

## 2024-02-05 ENCOUNTER — Ambulatory Visit: Payer: Medicare PPO | Admitting: Dermatology

## 2024-02-16 DIAGNOSIS — M25562 Pain in left knee: Secondary | ICD-10-CM | POA: Insufficient documentation

## 2024-02-17 ENCOUNTER — Encounter: Admitting: Family Medicine

## 2024-02-19 ENCOUNTER — Ambulatory Visit (INDEPENDENT_AMBULATORY_CARE_PROVIDER_SITE_OTHER): Admitting: Family Medicine

## 2024-02-19 ENCOUNTER — Encounter: Payer: Self-pay | Admitting: Family Medicine

## 2024-02-19 VITALS — BP 136/75 | HR 71 | Temp 97.3°F | Resp 18 | Ht 66.0 in | Wt 162.8 lb

## 2024-02-19 DIAGNOSIS — R42 Dizziness and giddiness: Secondary | ICD-10-CM | POA: Insufficient documentation

## 2024-02-19 DIAGNOSIS — I10 Essential (primary) hypertension: Secondary | ICD-10-CM

## 2024-02-19 DIAGNOSIS — E559 Vitamin D deficiency, unspecified: Secondary | ICD-10-CM | POA: Diagnosis not present

## 2024-02-19 DIAGNOSIS — E538 Deficiency of other specified B group vitamins: Secondary | ICD-10-CM | POA: Diagnosis not present

## 2024-02-19 DIAGNOSIS — H04123 Dry eye syndrome of bilateral lacrimal glands: Secondary | ICD-10-CM

## 2024-02-19 DIAGNOSIS — E785 Hyperlipidemia, unspecified: Secondary | ICD-10-CM | POA: Diagnosis not present

## 2024-02-19 DIAGNOSIS — R7309 Other abnormal glucose: Secondary | ICD-10-CM

## 2024-02-19 DIAGNOSIS — Z87898 Personal history of other specified conditions: Secondary | ICD-10-CM

## 2024-02-19 DIAGNOSIS — G44219 Episodic tension-type headache, not intractable: Secondary | ICD-10-CM | POA: Insufficient documentation

## 2024-02-19 DIAGNOSIS — Z0000 Encounter for general adult medical examination without abnormal findings: Secondary | ICD-10-CM | POA: Diagnosis not present

## 2024-02-19 DIAGNOSIS — E782 Mixed hyperlipidemia: Secondary | ICD-10-CM

## 2024-02-19 DIAGNOSIS — E890 Postprocedural hypothyroidism: Secondary | ICD-10-CM

## 2024-02-19 DIAGNOSIS — M170 Bilateral primary osteoarthritis of knee: Secondary | ICD-10-CM

## 2024-02-19 HISTORY — DX: Essential (primary) hypertension: I10

## 2024-02-19 LAB — MICROALBUMIN / CREATININE URINE RATIO
Creatinine,U: 155 mg/dL
Microalb Creat Ratio: 21.4 mg/g (ref 0.0–30.0)
Microalb, Ur: 3.3 mg/dL — ABNORMAL HIGH (ref 0.0–1.9)

## 2024-02-19 LAB — B12 AND FOLATE PANEL
Folate: 8.6 ng/mL (ref >5.9)
Vitamin B-12: 404 pg/mL (ref 211–911)

## 2024-02-19 LAB — MAGNESIUM: Magnesium: 2.3 mg/dL (ref 1.5–2.5)

## 2024-02-19 LAB — CBC WITH DIFFERENTIAL/PLATELET
Basophils Absolute: 0 10*3/uL (ref 0.0–0.1)
Basophils Relative: 0.4 % (ref 0.0–3.0)
Eosinophils Absolute: 0 10*3/uL (ref 0.0–0.7)
Eosinophils Relative: 0.2 % (ref 0.0–5.0)
HCT: 38.6 % (ref 36.0–46.0)
Hemoglobin: 12.4 g/dL (ref 12.0–15.0)
Lymphocytes Relative: 26.2 % (ref 12.0–46.0)
Lymphs Abs: 2 10*3/uL (ref 0.7–4.0)
MCHC: 32.2 g/dL (ref 30.0–36.0)
MCV: 82.6 fl (ref 78.0–100.0)
Monocytes Absolute: 0.8 10*3/uL (ref 0.1–1.0)
Monocytes Relative: 10.3 % (ref 3.0–12.0)
Neutro Abs: 4.8 10*3/uL (ref 1.4–7.7)
Neutrophils Relative %: 62.9 % (ref 43.0–77.0)
Platelets: 407 10*3/uL — ABNORMAL HIGH (ref 150.0–400.0)
RBC: 4.67 Mil/uL (ref 3.87–5.11)
RDW: 14.7 % (ref 11.5–15.5)
WBC: 7.7 10*3/uL (ref 4.0–10.5)

## 2024-02-19 LAB — LIPID PANEL
Cholesterol: 259 mg/dL — ABNORMAL HIGH (ref 0–200)
HDL: 117.1 mg/dL (ref >39.00)
LDL Cholesterol: 130 mg/dL — ABNORMAL HIGH (ref 0–99)
NonHDL: 142.02
Total CHOL/HDL Ratio: 2
Triglycerides: 62 mg/dL (ref 0.0–149.0)
VLDL: 12.4 mg/dL (ref 0.0–40.0)

## 2024-02-19 LAB — VITAMIN D 25 HYDROXY (VIT D DEFICIENCY, FRACTURES): VITD: 18.66 ng/mL — ABNORMAL LOW (ref 30.00–100.00)

## 2024-02-19 LAB — HEMOGLOBIN A1C: Hgb A1c MFr Bld: 5.6 % (ref 4.6–6.5)

## 2024-02-19 MED ORDER — DICLOFENAC SODIUM 1 % EX GEL: 4.0000 g | Freq: Four times a day (QID) | CUTANEOUS | 3 refills | Status: DC | PRN

## 2024-02-19 MED ORDER — MECLIZINE HCL 25 MG PO TABS: ORAL_TABLET | ORAL | 0 refills | Status: AC

## 2024-02-19 MED ORDER — BUTALBITAL-APAP-CAFFEINE 50-325-40 MG PO TABS: ORAL_TABLET | ORAL | 0 refills | Status: DC

## 2024-02-19 NOTE — Progress Notes (Signed)
 Assessment  Assessment/Plan:   Assessment & Plan Knee pain Chronic knee pain with recent exacerbation, radiating pain, and associated swelling. Symptoms improved with prednisone . Awaiting knee replacement, delayed due to lack of support system and concerns about post-surgical support in a two-story house. - Continue prednisone  as prescribed by [facility]. - Consider diclofenac gel for additional pain relief, up to four times daily as needed. - Plan for cortisone injections on June 11. - Discuss potential knee replacement surgery when support system is available.  Hypercholesterolemia Managed with inclisiran due to statin intolerance. Reports joint discomfort potentially related to inclisiran, similar to previous statin side effects. Inclisiran is a new, expensive medication with limited primary care experience. Experiences discomfort after each biannual injection, but symptoms improve over time. Discussed potential side effects and alternative therapies. - Order cholesterol labs today. - Consider referral to lipid clinic for further management and evaluation of inclisiran side effects. - Discuss potential alternative therapies such as bempedoic acid  with lipid specialist.  Vitamin D  deficiency Vitamin D  deficiency with current status unknown, due for lab work. - Order vitamin D  level as part of today's lab work.  B12 deficiency B12 deficiency with current status unknown, due for lab work. - Order B12 and folate levels as part of today's lab work.  Dry eye syndrome Chronic dry eye syndrome managed with cyclosporine drops. No acute changes reported. - Continue current use of cyclosporine drops as prescribed.  General Health Maintenance Routine health maintenance discussed. Recent colonoscopy completed. Mammogram up to date. Discussed RSV vaccine availability and other routine vaccinations. - Consider RSV vaccine at pharmacy if interested. - Continue routine vaccinations as  needed.    Medications Discontinued During This Encounter  Medication Reason   meclizine  (ANTIVERT ) 25 MG tablet Reorder   butalbital -acetaminophen-caffeine  (FIORICET) 50-325-40 MG tablet Reorder    Patient Counseling(The following topics were reviewed and/or handout was given):  -Nutrition: Stressed importance of moderation in sodium/caffeine  intake, saturated fat and cholesterol, caloric balance, sufficient intake of fresh fruits, vegetables, and fiber.  -Stressed the importance of regular exercise.   -Substance Abuse: Discussed cessation/primary prevention of tobacco, alcohol, or other drug use; driving or other dangerous activities under the influence; availability of treatment for abuse.   -Injury prevention: Discussed safety belts, safety helmets, smoke detector, smoking near bedding or upholstery.   -Sexuality: Discussed sexually transmitted diseases, partner selection, use of condoms, avoidance of unintended pregnancy and contraceptive alternatives.   -Dental health: Discussed importance of regular tooth brushing, flossing, and dental visits.  -Health maintenance and immunizations reviewed. Please refer to Health maintenance section.  Return in about 6 months (around 08/21/2024) for follow up .        Subjective:   Encounter date: 02/19/2024  Chief Complaint  Patient presents with   Annual Exam    Pt is fasting today. Rx refill request   HM- updated cologuard done on 05/12/2023 (negative), colonoscopy on 01/05/2024, mammogram due yearly 2025    Knee Pain    Pt c/o of left knee pain and discomfort; she is currently seeing [facility] Elinor Guardian Sprague, PA-C) and was prescribed prednisone .      Discussed the use of AI scribe software for clinical note transcription with the patient, who gave verbal consent to proceed.  History of Present Illness Laura Maynard is a 78 year old female who presents for a physical exam and medication refills.  She experiences ongoing  knee discomfort, particularly in the right knee, which she describes as the 'worst  one'. The pain is located in the knee and sometimes radiates to the thigh. She has been seen by [facility] and was prescribed prednisone , which she started on Monday. She mentions needing a knee replacement but is delaying it due to lack of a support system at home, as she lives in a two-story house and has relatives living out of town.  She is currently taking inclisiran for high cholesterol, having experienced issues with statins like Crestor  and Lipitor. After taking the shot in April, she experiences discomfort in her knees and joints similar to when she was on statins. The medication is administered every six months, and she notes that her symptoms improve just as it is time for the next dose.  She has a history of B12 and vitamin D  deficiencies. She is also on cyclosporine drops for dry eyes, which she uses in both eyes, although her left eye is the 'good eye'. She has a history of an eye injury from school, resulting in reduced vision in one eye.  She uses Fioricet for headaches, which she takes as needed, and has not required a refill since January. She also discusses vertigo and the use of meclizine , which she prefers to obtain from [place] due to insurance coverage issues at [employer]. No new or worsening symptoms beyond the knee discomfort and joint pain associated with her cholesterol medication.       02/19/2024    8:59 AM 02/19/2024    8:42 AM 09/18/2023   10:52 PM 04/28/2023   10:27 AM 01/26/2023    9:07 PM  Depression screen PHQ 2/9  Decreased Interest 0 0 0 0 0  Down, Depressed, Hopeless 0 0 0 0 0  PHQ - 2 Score 0 0 0 0 0  Altered sleeping 0      Tired, decreased energy 0      Change in appetite 0      Feeling bad or failure about yourself  0      Trouble concentrating 0      Moving slowly or fidgety/restless 0      Suicidal thoughts 0      PHQ-9 Score 0      Difficult doing work/chores Not  difficult at all           02/19/2024    8:59 AM  GAD 7 : Generalized Anxiety Score  Nervous, Anxious, on Edge 0  Control/stop worrying 0  Worry too much - different things 0  Trouble relaxing 0  Restless 0  Easily annoyed or irritable 0  Afraid - awful might happen 0  Total GAD 7 Score 0  Anxiety Difficulty Not difficult at all    Health Maintenance Due  Topic Date Due   COVID-19 Vaccine (4 - 2024-25 season) 06/01/2023   MAMMOGRAM  10/04/2023       PMH:  The following were reviewed and entered/updated in epic: 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   High cholesterol    Hypertension    Hypothyroidism    Migraine    Prediabetes    Sarcoidosis, lung (HCC) 15 YRS AGO   NO TREATMENTS   Toxic multinodular goiter 05/27/2014   Vertigo    Vitamin D  deficiency     Patient Active Problem List   Diagnosis Date Noted   Essential hypertension 02/19/2024   B12 deficiency 02/19/2024   Vertigo 02/19/2024   Episodic tension-type headache, not intractable  02/19/2024   Seborrheic dermatitis of scalp 12/18/2023   Alopecia 12/18/2023   Blindness, one eye, low vision other eye, unspecified eyes 12/18/2023   Gastrointestinal complaint 12/18/2023   Change in bowel habits 10/03/2023   Blood in urine 07/18/2022   Right sided sciatica 03/29/2021   Insomnia 07/14/2020   Migraine without aura and without status migrainosus, not intractable 04/19/2020   Statin myopathy 04/17/2020   Seasonal allergic conjunctivitis 03/06/2020   Dry eye syndrome, bilateral 03/06/2020   Osteoarthritis of knees, bilateral 05/27/2019   Sialectasis 04/21/2019   Abnormal glucose 09/17/2018   FH: hypertension 06/14/2018   Pseudophakia of left eye 03/12/2018   Aphakia, right eye 01/15/2018   Cataract, nuclear sclerotic, left eye 01/14/2018   Cataract cortical, senile, left 01/14/2018   Multinodular goiter 08/28/2017   Medication  Phobia 02/04/2017   BMI 27.0-27.9,adult 07/27/2015   Postablative hypothyroidism 07/06/2015   Benign paroxysmal positional vertigo 09/14/2014   TMJ (temporomandibular joint syndrome) 09/14/2014   Poor compliance with medication 05/29/2014   Hyperlipidemia LDL goal <70 02/08/2014   Labile hypertension    History of prediabetes    Vitamin D  deficiency     Past Surgical History:  Procedure Laterality Date   BACK SURGERY  1970   CATARACT EXTRACTION Left 12/2017   COLONOSCOPY  2006; 03/28/11   2 small adenomas; normal   EYE SURGERY  1964   right eye   KNEE ARTHROSCOPY  2002   NASAL SEPTUM SURGERY  1998   TOTAL ABDOMINAL HYSTERECTOMY  2000    Family History  Problem Relation Age of Onset   Lung cancer Sister 74   Throat cancer Brother 104   Stroke Mother    Emphysema Father    COPD Sister    Diabetes Brother    Colon cancer Neg Hx     Medications- reviewed and updated Outpatient Medications Prior to Visit  Medication Sig Dispense Refill   Acetaminophen 325 MG CAPS Take by mouth.     AYR SALINE NASAL NA Place into the nose.     cholecalciferol (VITAMIN D3) 25 MCG (1000 UNIT) tablet Take 1 tablet (1,000 Units total) by mouth 2 (two) times daily with a meal.     clobetasol  (TEMOVATE ) 0.05 % external solution Apply 1 Application topically 2 (two) times daily. use one or two times a day PRN to affected areas 50 mL 0   Cyanocobalamin  (VITAMIN B 12 PO) Take by mouth.     cycloSPORINE (RESTASIS) 0.05 % ophthalmic emulsion Apply to eye.     inclisiran (LEQVIO ) 284 MG/1.5ML SOSY injection Inject 284mg  SQ at month 0 and month 3, then every 6 months 1.5 mL 2   levothyroxine  (SYNTHROID ) 50 MCG tablet Take 1.5 tablets (75 mcg total) by mouth daily before breakfast. 150 tablet 3   metroNIDAZOLE  (METROGEL ) 1 % gel Apply topically daily. Apply  Daily  to Acne Rosacea rash 60 g 3   Naproxen Sodium (ALEVE PO) Take by mouth. PRN     NEOMYCIN -POLYMYXIN-HYDROCORTISONE  (CORTISPORIN) 1 % SOLN OTIC  solution Apply  6 to 8 drops to affected ear 3 to 4 x /day 10 mL 3   predniSONE  (STERAPRED UNI-PAK 21 TAB) 5 MG (21) TBPK tablet Take by mouth as directed.     butalbital -acetaminophen-caffeine  (FIORICET) 50-325-40 MG tablet TAKE 1 TABLET  EVERY 4 HOURS AS NEEDED FOR SEVERE HEADACHE                                                                               /  TAKE                                                   BY                                        MOUTH 30 tablet 0   meclizine  (ANTIVERT ) 25 MG tablet Take      1 tablet      3 x day       if needed for Dizziness /Vertigo (Patient not taking: Reported on 02/19/2024) 100 tablet 0   No facility-administered medications prior to visit.    Allergies  Allergen Reactions   Penicillins Hives   Sulfa Antibiotics Hives   Zetia [Ezetimibe]     Myalgia   Doxycycline  Itching    itching   Gabapentin  Nausea And Vomiting   Pravastatin Other (See Comments)    Myalgia   Rosuvastatin  Other (See Comments)    Joint/muscle aches, fatigue    Social History   Socioeconomic History   Marital status: Divorced    Spouse name: Not on file   Number of children: 0   Years of education: college   Highest education level: Not on file  Occupational History    Comment: Retired  Tobacco Use   Smoking status: Never   Smokeless tobacco: Never  Vaping Use   Vaping status: Never Used  Substance and Sexual Activity   Alcohol use: Never    Alcohol/week: 1.0 standard drink of alcohol    Types: 1 Standard drinks or equivalent per week    Comment: RARE   Drug use: Never   Sexual activity: Not Currently  Other Topics Concern   Not on file  Social History Narrative   Patient is retired and lives at home alone. Patient  has college education.   Caffeine - 3 cups of caffeine  daily.  One soda daily.   Right handed.   Social Drivers of Corporate investment banker Strain: Not on file  Food Insecurity: Not on file   Transportation Needs: Not on file  Physical Activity: Not on file  Stress: Not on file  Social Connections: Not on file           Objective:  Physical Exam: BP 136/75 (BP Location: Left Arm, Patient Position: Sitting, Cuff Size: Normal)   Pulse 71   Temp (!) 97.3 F (36.3 C) (Temporal)   Resp 18   Ht 5\' 6"  (1.676 m)   Wt 162 lb 12.8 oz (73.8 kg)   SpO2 99%   BMI 26.28 kg/m   Body mass index is 26.28 kg/m. Wt Readings from Last 3 Encounters:  02/19/24 162 lb 12.8 oz (73.8 kg)  01/21/24 162 lb 12.8 oz (73.8 kg)  01/05/24 162 lb (73.5 kg)    Physical Exam VITALS: BP- 136/75 GENERAL: Alert, cooperative, well developed, no acute distress. HEENT: Normocephalic, normal oropharynx, moist mucous membranes, ears clear of cerumen bilaterally. CHEST: Clear to auscultation bilaterally, no wheezes, rhonchi, or crackles. CARDIOVASCULAR: Normal heart rate and rhythm, S1 and S2 normal without murmurs. ABDOMEN: Soft, non-tender, non-distended, without organomegaly, normal bowel sounds. EXTREMITIES: No cyanosis or edema, 5/5 strength in upper extremities. MUSCULOSKELETAL: Right knee swollen and tender, left knee non-swollen. NEUROLOGICAL: Cranial nerves grossly intact,  moves all extremities without gross motor or sensory deficit.  Physical Exam Constitutional:      General: She is not in acute distress.    Appearance: Normal appearance. She is not ill-appearing or toxic-appearing.  HENT:     Head: Normocephalic and atraumatic.     Right Ear: Hearing, tympanic membrane, ear canal and external ear normal. There is no impacted cerumen.     Left Ear: Hearing, tympanic membrane, ear canal and external ear normal. There is no impacted cerumen.     Nose: Nose normal. No congestion.     Mouth/Throat:     Lips: No lesions.     Mouth: Mucous membranes are moist.     Pharynx: Oropharynx is clear. No oropharyngeal exudate.  Eyes:     General: No scleral icterus.       Right eye: No  discharge.        Left eye: No discharge.     Conjunctiva/sclera: Conjunctivae normal.     Pupils: Pupils are equal, round, and reactive to light.  Neck:     Thyroid : No thyroid  mass, thyromegaly or thyroid  tenderness.  Cardiovascular:     Rate and Rhythm: Normal rate and regular rhythm.     Pulses: Normal pulses.     Heart sounds: Normal heart sounds.  Pulmonary:     Effort: Pulmonary effort is normal. No respiratory distress.     Breath sounds: Normal breath sounds.  Abdominal:     General: Abdomen is flat. Bowel sounds are normal.     Palpations: Abdomen is soft.  Musculoskeletal:        General: Normal range of motion.     Cervical back: Normal range of motion.     Left lower leg: No tenderness.     Comments: 5/5 strength in upper extremities.   Lymphadenopathy:     Cervical: No cervical adenopathy.  Skin:    General: Skin is warm and dry.     Findings: No rash.  Neurological:     General: No focal deficit present.     Mental Status: She is alert and oriented to person, place, and time. Mental status is at baseline.     Deep Tendon Reflexes:     Reflex Scores:      Patellar reflexes are 2+ on the right side and 2+ on the left side. Psychiatric:        Mood and Affect: Mood normal.        Behavior: Behavior normal.        Thought Content: Thought content normal.        Judgment: Judgment normal.         Prior labs:   No results found for this or any previous visit (from the past 2160 hours).  Lab Results  Component Value Date   CHOL 217 (H) 04/28/2023   CHOL 241 (H) 02/06/2022   CHOL 232 (H) 10/02/2021   Lab Results  Component Value Date   HDL 91 04/28/2023   HDL 114 02/06/2022   HDL 104 10/02/2021   Lab Results  Component Value Date   LDLCALC 107 (H) 04/28/2023   LDLCALC 118 (H) 02/06/2022   LDLCALC 117 (H) 10/02/2021   Lab Results  Component Value Date   TRIG 94 04/28/2023   TRIG 54 02/06/2022   TRIG 67 10/02/2021   Lab Results  Component  Value Date   CHOLHDL 2.4 04/28/2023   CHOLHDL 2.1 02/06/2022   CHOLHDL 2.2 10/02/2021   No  results found for: "LDLDIRECT"  Last metabolic panel Lab Results  Component Value Date   GLUCOSE 88 07/30/2023   NA 141 07/30/2023   K 4.2 07/30/2023   CL 107 07/30/2023   CO2 28 07/30/2023   BUN 10 07/30/2023   CREATININE 0.81 07/30/2023   EGFR 75 07/30/2023   CALCIUM  9.8 07/30/2023   PROT 6.8 07/30/2023   ALBUMIN 4.4 02/04/2017   BILITOT 0.5 07/30/2023   ALKPHOS 82 02/04/2017   AST 18 07/30/2023   ALT 14 07/30/2023   ANIONGAP 9 11/07/2015    Lab Results  Component Value Date   HGBA1C 5.5 07/30/2023    Last CBC Lab Results  Component Value Date   WBC 4.6 07/30/2023   HGB 12.0 07/30/2023   HCT 37.8 07/30/2023   MCV 85.5 07/30/2023   MCH 27.1 07/30/2023   RDW 13.1 07/30/2023   PLT 420 (H) 07/30/2023    Lab Results  Component Value Date   TSH 1.62 01/27/2023    No results found for: "PSA1", "PSA"  Last vitamin D  Lab Results  Component Value Date   VD25OH 15 (L) 07/30/2023    Lab Results  Component Value Date   BILIRUBINUR NEGATIVE 02/07/2017   PROTEINUR NEGATIVE 01/27/2023   UROBILINOGEN 0.2 12/20/2014   LEUKOCYTESUR NEGATIVE 01/27/2023    Lab Results  Component Value Date   MICROALBUR 0.6 01/27/2023   MICROALBUR 2.1 01/23/2022     At today's visit, we discussed treatment options, associated risk and benefits, and engage in counseling as needed.  Additionally the following were reviewed: Past medical records, past medical and surgical history, family and social background, as well as relevant laboratory results, imaging findings, and specialty notes, where applicable.  This message was generated using dictation software, and as a result, it may contain unintentional typos or errors.  Nevertheless, extensive effort was made to accurately convey at the pertinent aspects of the patient visit.    There may have been are other unrelated non-urgent  complaints, but due to the busy schedule and the amount of time already spent with her, time does not permit to address these issues at today's visit. Another appointment may have or has been requested to review these additional issues.     Harle Libra, MD, MS

## 2024-02-19 NOTE — Patient Instructions (Signed)
  VISIT SUMMARY: Today, you came in for a physical exam and to discuss your ongoing knee discomfort, medication refills, and general health maintenance. We reviewed your current medications and discussed your knee pain, cholesterol management, vitamin deficiencies, and dry eye syndrome.  YOUR PLAN: -KNEE PAIN: You have chronic knee pain, especially in your right knee, which sometimes radiates to your thigh. You are currently taking prednisone , which has helped with the pain. We discussed using diclofenac gel up to four times daily for additional relief and planning for cortisone injections on June 11. We will also revisit the possibility of knee replacement surgery when you have a support system in place. Knee pain is often due to wear and tear of the joint, leading to discomfort and swelling.  -HYPERCHOLESTEROLEMIA: You are managing your high cholesterol with inclisiran due to intolerance to statins. You experience joint discomfort after each biannual injection, which improves over time. We ordered cholesterol labs today and will consider referring you to a lipid clinic to evaluate the side effects of inclisiran and discuss alternative therapies like bempedoic acid . Hypercholesterolemia means having high levels of cholesterol in the blood, which can increase the risk of heart disease.  -VITAMIN D  DEFICIENCY: You have a history of vitamin D  deficiency, and we need to check your current levels. We ordered a vitamin D  level test as part of today's lab work. Vitamin D  deficiency can lead to bone weakness and other health issues.  -B12 DEFICIENCY: You have a history of B12 deficiency, and we need to check your current levels. We ordered B12 and folate levels as part of today's lab work. B12 deficiency can cause fatigue, weakness, and other symptoms.  -DRY EYE SYNDROME: You have chronic dry eye syndrome and are using cyclosporine drops as prescribed. Continue using the drops as directed. Dry eye syndrome  occurs when your eyes do not produce enough tears or the right quality of tears.  -GENERAL HEALTH MAINTENANCE: We discussed your routine health maintenance. Your recent colonoscopy and mammogram are up to date. We talked about the availability of the RSV vaccine and other routine vaccinations. Consider getting the RSV vaccine at the pharmacy if you are interested, and continue with routine vaccinations as needed.  INSTRUCTIONS: Please follow up with the lab work ordered today, including cholesterol, vitamin D , B12, and folate levels. Plan for cortisone injections on June 11 for your knee pain. Consider visiting a lipid clinic for further evaluation of your cholesterol management and potential side effects of inclisiran. If interested, get the RSV vaccine at the pharmacy and continue with routine vaccinations as needed.

## 2024-02-20 LAB — URINALYSIS W MICROSCOPIC + REFLEX CULTURE
Bilirubin Urine: NEGATIVE
Glucose, UA: NEGATIVE
Hyaline Cast: NONE SEEN /LPF
Ketones, ur: NEGATIVE
Leukocyte Esterase: NEGATIVE
Nitrites, Initial: NEGATIVE
Protein, ur: NEGATIVE
Specific Gravity, Urine: 1.02 (ref 1.001–1.035)
Squamous Epithelial / HPF: NONE SEEN /HPF (ref <=5)
WBC, UA: NONE SEEN /HPF (ref 0–5)
pH: <=5 — AB (ref 5.0–8.0)

## 2024-02-20 LAB — INSULIN, RANDOM: Insulin: 14.8 u[IU]/mL

## 2024-02-20 LAB — NO CULTURE INDICATED

## 2024-02-20 LAB — THYROID PANEL WITH TSH
Free Thyroxine Index: 2.6 (ref 1.4–3.8)
T3 Uptake: 31 % (ref 22–35)
T4, Total: 8.3 ug/dL (ref 5.1–11.9)
TSH: 1.01 m[IU]/L (ref 0.40–4.50)

## 2024-02-20 LAB — EXTRA SPECIMEN

## 2024-02-21 LAB — NMR, LIPOPROFILE
Cholesterol, Total: 259 mg/dL — ABNORMAL HIGH (ref 100–199)
HDL Particle Number: 36.9 umol/L (ref >=30.5)
HDL-C: 118 mg/dL (ref >39)
LDL Particle Number: 1034 nmol/L — ABNORMAL HIGH (ref <1000)
LDL Size: 21.5 nm (ref >20.5)
LDL-C (NIH Calc): 130 mg/dL — ABNORMAL HIGH (ref 0–99)
LP-IR Score: <25 (ref <=45)
Small LDL Particle Number: <90 nmol/L (ref <=527)
Triglycerides: 67 mg/dL (ref 0–149)

## 2024-02-22 LAB — COMPLETE METABOLIC PANEL WITHOUT GFR
AG Ratio: 1.9 (calc) (ref 1.0–2.5)
ALT: 9 U/L (ref 6–29)
AST: 16 U/L (ref 10–35)
Albumin: 4.9 g/dL (ref 3.6–5.1)
Alkaline phosphatase (APISO): 112 U/L (ref 37–153)
BUN: 16 mg/dL (ref 7–25)
CO2: 20 mmol/L (ref 20–32)
Calcium: 9.5 mg/dL (ref 8.6–10.4)
Chloride: 111 mmol/L — ABNORMAL HIGH (ref 98–110)
Creat: 0.87 mg/dL (ref 0.60–1.00)
Globulin: 2.6 g/dL (ref 1.9–3.7)
Glucose, Bld: 83 mg/dL (ref 65–99)
Potassium: 4.6 mmol/L (ref 3.5–5.3)
Sodium: 149 mmol/L — ABNORMAL HIGH (ref 135–146)
Total Bilirubin: 0.3 mg/dL (ref 0.2–1.2)
Total Protein: 7.5 g/dL (ref 6.1–8.1)

## 2024-02-24 ENCOUNTER — Ambulatory Visit: Payer: Self-pay | Admitting: Family Medicine

## 2024-02-24 DIAGNOSIS — R3121 Asymptomatic microscopic hematuria: Secondary | ICD-10-CM

## 2024-02-24 DIAGNOSIS — E559 Vitamin D deficiency, unspecified: Secondary | ICD-10-CM

## 2024-02-24 MED ORDER — VITAMIN D (ERGOCALCIFEROL) 1.25 MG (50000 UNIT) PO CAPS: 50000.0000 [IU] | ORAL_CAPSULE | ORAL | 3 refills | Status: DC

## 2024-02-25 NOTE — Telephone Encounter (Signed)
 Copied from CRM [id number]. Topic: Clinical - Lab/Test Results >> Feb 25, 2024  1:38 PM Laura Maynard wrote: Reason for CRM:pt called for lab results  please call pt back at [phone]

## 2024-03-03 ENCOUNTER — Telehealth: Payer: Self-pay

## 2024-03-03 NOTE — Telephone Encounter (Signed)
 Copied from CRM [id number]. Topic: Clinical - Lab/Test Results >> Feb 25, 2024  1:38 PM Earnestine Goes B wrote: Reason for CRM:pt called for lab results  please call pt back at [phone] >> Mar 02, 2024  3:27 PM Caliyah H wrote: Patient called to follow up on lab results from 02/19/24. She stated haven't received anything through mail and is requesting a call from Dr. Hildy Lowers nurse.

## 2024-03-03 NOTE — Telephone Encounter (Signed)
 Spoke to patient and provided location and contact information. Pt verbalized understanding and will call to schedule appointment.

## 2024-03-03 NOTE — Telephone Encounter (Signed)
 Spoke to patient and see have concerns with recent lab report with cholesterol and thyroid  and would like to know the next steps. Pt is also requesting urology referral change to [place] location instead of [place]; recent labs results that was collected on 02/19/2024 were mailed ( 2nd attempt on 03/03/2024).

## 2024-03-03 NOTE — Telephone Encounter (Signed)
 Pt will stop by clinic tomorrow for recent lab work results print out; placed in folder at front desk.

## 2024-03-10 DIAGNOSIS — M170 Bilateral primary osteoarthritis of knee: Secondary | ICD-10-CM | POA: Diagnosis not present

## 2024-03-24 ENCOUNTER — Ambulatory Visit: Admitting: Dermatology

## 2024-04-27 ENCOUNTER — Ambulatory Visit: Payer: Medicare PPO | Admitting: Nurse Practitioner

## 2024-04-27 ENCOUNTER — Encounter: Payer: Self-pay | Admitting: Internal Medicine

## 2024-04-27 ENCOUNTER — Ambulatory Visit: Admitting: Internal Medicine

## 2024-04-27 ENCOUNTER — Telehealth: Payer: Self-pay

## 2024-04-27 VITALS — BP 120/72 | HR 89 | Temp 98.0°F | Ht 66.0 in | Wt 160.8 lb

## 2024-04-27 DIAGNOSIS — J0140 Acute pansinusitis, unspecified: Secondary | ICD-10-CM

## 2024-04-27 MED ORDER — AZITHROMYCIN 250 MG PO TABS: ORAL_TABLET | ORAL | 0 refills | Status: AC

## 2024-04-27 NOTE — Telephone Encounter (Signed)
 Spoke to patient and offered a sooner appointment for today with Rosina Senters NP at 2:00 pm. Pt verbalized understanding and a thank you.

## 2024-04-27 NOTE — Patient Instructions (Signed)
 Rest, drink plenty of fluids.  Tylenol / ibuprofen  for pain  For nasal and sinus congestion: Patients with high blood pressure can use Coricidin HBP for decongestant, it will not raise your blood pressure.   For sore throat:  Throat lozenges  Salt water gargles     If you have been prescribed an antibiotic, take as prescribed.  Call if not gradually better over the next  10 days  Call anytime if the symptoms are severe, you have high fever, short of breath, chest pain

## 2024-04-27 NOTE — Progress Notes (Unsigned)
 [facility] [address] [place] [address] Dept: [phone] Dept Fax: [phone]  Acute Care Office Visit  Subjective:   Laura Maynard 1946-02-18 04/27/2024  Chief Complaint  Patient presents with   Sinusitis    Head pressure started 2 weeks ago and getting worse     HPI: Discussed the use of AI scribe software for clinical note transcription with the patient, who gave verbal consent to proceed.  History of Present Illness   Laura Maynard is a 78 year old female who presents with symptoms suggestive of a sinus infection.  She has been experiencing a sore throat, rhinorrhea, dental pain, headache, dizziness, and nausea for about a week. Initially, she had a dull headache and rhinorrhea, but symptoms have worsened significantly over the past two days. She describes a sensation of her head being 'underwater' and mentions a hissing sound in her ears.  She has a history of migraines and initially thought the headache was related to that. She has been using over-the-counter allergy relief medications, including Pseudoephedrine  and chlorphenamine, but reports they have not been effective.  She denies any recent exposure to sick individuals and mentions staying indoors due to the heat.  She has a history of allergies to penicillin, sulfa, and doxycycline . She recently completed a course of prednisone  for an unspecified condition. She is currently taking Fioricet for headache pain, and has not been using ibuprofen .  She performed a COVID test prior to the visit, which was negative.      The following portions of the patient's history were reviewed and updated as appropriate: past medical history, past surgical history, family history, social history, allergies, medications, and problem list.   Patient Active Problem List   Diagnosis Date Noted   Essential hypertension 02/19/2024   B12 deficiency 02/19/2024   Vertigo  02/19/2024   Episodic tension-type headache, not intractable 02/19/2024   Seborrheic dermatitis of scalp 12/18/2023   Alopecia 12/18/2023   Blindness, one eye, low vision other eye, unspecified eyes 12/18/2023   Gastrointestinal complaint 12/18/2023   Change in bowel habits 10/03/2023   Blood in urine 07/18/2022   Right sided sciatica 03/29/2021   Insomnia 07/14/2020   Migraine without aura and without status migrainosus, not intractable 04/19/2020   Statin myopathy 04/17/2020   Seasonal allergic conjunctivitis 03/06/2020   Dry eye syndrome, bilateral 03/06/2020   Osteoarthritis of knees, bilateral 05/27/2019   Sialectasis 04/21/2019   Abnormal glucose 09/17/2018   FH: hypertension 06/14/2018   Pseudophakia of left eye 03/12/2018   Aphakia, right eye 01/15/2018   Cataract, nuclear sclerotic, left eye 01/14/2018   Cataract cortical, senile, left 01/14/2018   Multinodular goiter 08/28/2017   Medication Phobia 02/04/2017   BMI 27.0-27.9,adult 07/27/2015   Postablative hypothyroidism 07/06/2015   Benign paroxysmal positional vertigo 09/14/2014   TMJ (temporomandibular joint syndrome) 09/14/2014   Poor compliance with medication 05/29/2014   Hyperlipidemia LDL goal <70 02/08/2014   Labile hypertension    History of prediabetes    Vitamin D  deficiency    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   High cholesterol    Hypertension    Hypothyroidism    Migraine    Prediabetes    Sarcoidosis, lung (HCC) 15 YRS AGO   NO TREATMENTS   Toxic multinodular goiter 05/27/2014   Vertigo    Vitamin D  deficiency    Past Surgical History:  Procedure Laterality Date   BACK SURGERY  1970   CATARACT EXTRACTION Left 12/2017   COLONOSCOPY  2006; 03/28/11   2 small adenomas; normal   EYE SURGERY  1964   right eye   KNEE ARTHROSCOPY  2002   NASAL SEPTUM SURGERY  1998   TOTAL ABDOMINAL HYSTERECTOMY  2000   Family History   Problem Relation Age of Onset   Lung cancer Sister 52   Throat cancer Brother 24   Stroke Mother    Emphysema Father    COPD Sister    Diabetes Brother    Colon cancer Neg Hx     Current Outpatient Medications:    Acetaminophen 325 MG CAPS, Take by mouth., Disp: , Rfl:    AYR SALINE NASAL NA, Place into the nose., Disp: , Rfl:    azithromycin  (ZITHROMAX ) 250 MG tablet, Take 2 tablets on day 1, then 1 tablet daily on days 2 through 5, Disp: 6 tablet, Rfl: 0   butalbital -acetaminophen-caffeine  (FIORICET) 50-325-40 MG tablet, TAKE 1 TABLET  EVERY 4 HOURS AS NEEDED FOR SEVERE HEADACHE                                                                               /                                      TAKE                                                   BY                                        MOUTH, Disp: 30 tablet, Rfl: 0   cholecalciferol (VITAMIN D3) 25 MCG (1000 UNIT) tablet, Take 1 tablet (1,000 Units total) by mouth 2 (two) times daily with a meal., Disp: , Rfl:    clobetasol  (TEMOVATE ) 0.05 % external solution, Apply 1 Application topically 2 (two) times daily. use one or two times a day PRN to affected areas, Disp: 50 mL, Rfl: 0   Cyanocobalamin  (VITAMIN B 12 PO), Take by mouth., Disp: , Rfl:    cycloSPORINE (RESTASIS) 0.05 % ophthalmic emulsion, Apply to eye., Disp: , Rfl:    diclofenac  Sodium (VOLTAREN ) 1 % GEL, Apply 4 g topically 4 (four) times daily as needed., Disp: 100 g, Rfl: 3   inclisiran (LEQVIO ) 284 MG/1.5ML SOSY injection, Inject 284mg  SQ at month 0 and month 3, then every 6 months, Disp: 1.5 mL, Rfl: 2   levothyroxine  (SYNTHROID ) 50 MCG tablet, Take 1.5 tablets (75 mcg total) by mouth daily before breakfast., Disp: 150 tablet, Rfl: 3   meclizine  (ANTIVERT ) 25 MG tablet, Take      1 tablet      3 x day       if needed for Dizziness /Vertigo, Disp: 100 tablet, Rfl:  0   metroNIDAZOLE  (METROGEL ) 1 % gel, Apply topically daily. Apply  Daily  to Acne Rosacea rash, Disp: 60 g,  Rfl: 3   Naproxen Sodium (ALEVE PO), Take by mouth. PRN, Disp: , Rfl:    NEOMYCIN -POLYMYXIN-HYDROCORTISONE  (CORTISPORIN) 1 % SOLN OTIC solution, Apply  6 to 8 drops to affected ear 3 to 4 x /day, Disp: 10 mL, Rfl: 3   predniSONE  (STERAPRED UNI-PAK 21 TAB) 5 MG (21) TBPK tablet, Take by mouth as directed., Disp: , Rfl:    Vitamin D , Ergocalciferol , (DRISDOL ) 1.25 MG (50000 UNIT) CAPS capsule, Take 1 capsule (50,000 Units total) by mouth every 7 (seven) days., Disp: 12 capsule, Rfl: 3 Allergies  Allergen Reactions   Penicillins Hives   Sulfa Antibiotics Hives   Zetia [Ezetimibe]     Myalgia   Doxycycline  Itching    itching   Gabapentin  Nausea And Vomiting   Pravastatin Other (See Comments)    Myalgia   Rosuvastatin  Other (See Comments)    Joint/muscle aches, fatigue     ROS: A complete ROS was performed with pertinent positives/negatives noted in the HPI. The remainder of the ROS are negative.    Objective:   Today's Vitals   04/27/24 1403  BP: 120/72  Pulse: 89  Temp: 98 F (36.7 C)  TempSrc: Temporal  SpO2: 98%  Weight: 160 lb 12.8 oz (72.9 kg)  Height: 5' 6 (1.676 m)    GENERAL: Well-appearing, in NAD. Well nourished.  SKIN: Pink, warm and dry. No rash.  HEENT:    HEAD: Normocephalic, non-traumatic.  EYES: Conjunctive pink without exudate. EARS: External ear w/o redness, swelling, masses, or lesions. EAC clear. TM's intact, translucent w/o bulging, appropriate landmarks visualized. Hazy appearance to right TM.  NOSE: Septum midline w/o deformity. Nares patent, mucosa pink and inflamed w/o drainage. Frontal and maxillary sinus tenderness.  THROAT: Uvula midline. Oropharynx clear. Tonsils non-inflamed w/o exudate . Mucus membranes pink and moist.  NECK: Trachea midline. Full ROM w/o pain or tenderness. No lymphadenopathy.  RESPIRATORY: Chest wall symmetrical. Respirations even and non-labored. Breath sounds clear to auscultation bilaterally.  CARDIAC: S1, S2 present,  regular rate and rhythm. Peripheral pulses 2+ bilaterally.  EXTREMITIES: Without clubbing, cyanosis, or edema.  NEUROLOGIC: Steady, even gait.  PSYCH/MENTAL STATUS: Alert, oriented x 3. Cooperative, appropriate mood and affect.    No results found for any visits on 04/27/24.    Assessment & Plan:  Assessment and Plan    Acute sinusitis - Prescribed azithromycin  (Z-Pak) for sinus infection. - Recommended Coricidin HBP for sinus congestion instead of pseudoephedrine  for HTN concerns. - Advised sinus rinses using a neti pot. - Suggested salt water gargles and throat lozenges for sore throat. - Recommended acetaminophen or ibuprofen  for sinus pain and pressure.    Meds ordered this encounter  Medications   azithromycin  (ZITHROMAX ) 250 MG tablet    Sig: Take 2 tablets on day 1, then 1 tablet daily on days 2 through 5    Dispense:  6 tablet    Refill:  0    Supervising Provider:   SEBASTIAN BEVERLEY NOVAK [[id number]]   No orders of the defined types were placed in this encounter.  Lab Orders  No laboratory test(s) ordered today   No images are attached to the encounter or orders placed in the encounter.  Return if symptoms worsen or fail to improve.   Laura Senters, FNP

## 2024-04-27 NOTE — Telephone Encounter (Signed)
 Copied from CRM [id number]. Topic: Appointments - Appointment Scheduling >> Apr 27, 2024  8:05 AM Laura Maynard wrote: Patient/patient representative is calling to schedule an appointment. Refer to attachments for appointment information.   Patient called stating she has a possible sinus infection and want to know if she can be fit in today for an appointment and patient also want to know if the doctor recommend anything  [phone]

## 2024-04-28 ENCOUNTER — Ambulatory Visit: Admitting: Internal Medicine

## 2024-05-11 ENCOUNTER — Encounter: Payer: Self-pay | Admitting: Family Medicine

## 2024-05-11 ENCOUNTER — Ambulatory Visit: Admitting: Family Medicine

## 2024-05-11 VITALS — BP 130/75 | HR 75 | Temp 98.2°F | Resp 18 | Wt 163.2 lb

## 2024-05-11 DIAGNOSIS — E785 Hyperlipidemia, unspecified: Secondary | ICD-10-CM | POA: Diagnosis not present

## 2024-05-11 DIAGNOSIS — L219 Seborrheic dermatitis, unspecified: Secondary | ICD-10-CM | POA: Diagnosis not present

## 2024-05-11 DIAGNOSIS — G43009 Migraine without aura, not intractable, without status migrainosus: Secondary | ICD-10-CM | POA: Diagnosis not present

## 2024-05-11 DIAGNOSIS — G720 Drug-induced myopathy: Secondary | ICD-10-CM | POA: Diagnosis not present

## 2024-05-11 DIAGNOSIS — M5431 Sciatica, right side: Secondary | ICD-10-CM | POA: Diagnosis not present

## 2024-05-11 DIAGNOSIS — E042 Nontoxic multinodular goiter: Secondary | ICD-10-CM | POA: Diagnosis not present

## 2024-05-11 DIAGNOSIS — L659 Nonscarring hair loss, unspecified: Secondary | ICD-10-CM | POA: Diagnosis not present

## 2024-05-11 DIAGNOSIS — R519 Headache, unspecified: Secondary | ICD-10-CM

## 2024-05-11 DIAGNOSIS — H8110 Benign paroxysmal vertigo, unspecified ear: Secondary | ICD-10-CM | POA: Diagnosis not present

## 2024-05-11 DIAGNOSIS — J069 Acute upper respiratory infection, unspecified: Secondary | ICD-10-CM

## 2024-05-11 LAB — POC COVID19 BINAXNOW: SARS Coronavirus 2 Ag: NEGATIVE

## 2024-05-11 LAB — POCT INFLUENZA A/B
Influenza A, POC: NEGATIVE
Influenza B, POC: NEGATIVE

## 2024-05-11 MED ORDER — CEFUROXIME AXETIL 500 MG PO TABS: 500.0000 mg | ORAL_TABLET | Freq: Two times a day (BID) | ORAL | 0 refills | Status: AC

## 2024-05-11 MED ORDER — PREDNISONE 20 MG PO TABS: 40.0000 mg | ORAL_TABLET | Freq: Every day | ORAL | 0 refills | Status: AC

## 2024-05-11 NOTE — Progress Notes (Signed)
 Assessment & Plan   Assessment/Plan:     Assessment & Plan Headache with vision changes, possible temporal arteritis versus acute sinusitis Chronic dull headache with vision changes, ear pain, and sinus pressure for a couple of weeks. Negative COVID and flu tests. Differential diagnosis includes acute sinusitis, glaucoma, ICH, intracranial mass, and temporal arteritis. Sinusitis is considered due to symptoms of sinus pressure, ear pain, and teeth sensitivity. Temporal arteritis is considered due to chronic headache, vision changes, and age-related risk. Immediate treatment is prioritized to prevent potential vision loss associated with temporal arteritis. - Order CBC, ESR, and CRP to check for inflammatory markers - Order MR brain with and without contrast to evaluate for intracranial pathology or hemorrhage - Initiate prednisone  40 mg daily for 10 days - Prescribe cefuroxime  500 mg BID due to allergies and low cross-reactivity with penicillin allergy - Refer to ophthalmology urgently - Advise follow-up in 3 days - Instruct to go to ED if worsening headache, changes in vision, or severe fevers occur  Migraine (history of) Current headache differs from typical migraines, presenting as a dull, persistent headache with additional symptoms of ear pain and sinus pressure.      Medications Discontinued During This Encounter  Medication Reason   predniSONE  (STERAPRED UNI-PAK 21 TAB) 5 MG (21) TBPK tablet     Return in about 3 days (around 05/14/2024) for headache.        Subjective:   Encounter date: 05/11/2024  Laura Maynard is a 78 y.o. female who has Labile hypertension; History of prediabetes; Vitamin D  deficiency; Hyperlipidemia LDL goal <70; Poor compliance with medication; Benign paroxysmal positional vertigo; TMJ (temporomandibular joint syndrome); Postablative hypothyroidism; BMI 27.0-27.9,adult; Medication Phobia; Multinodular goiter; FH: hypertension; Abnormal glucose;  Statin myopathy; Migraine without aura and without status migrainosus, not intractable; Insomnia; Right sided sciatica; Sialectasis; Seasonal allergic conjunctivitis; Pseudophakia of left eye; Dry eye syndrome, bilateral; Cataract, nuclear sclerotic, left eye; Cataract cortical, senile, left; Blood in urine; Aphakia, right eye; Osteoarthritis of knees, bilateral; Change in bowel habits; Seborrheic dermatitis of scalp; Alopecia; Blindness, one eye, low vision other eye, unspecified eyes; Gastrointestinal complaint; Essential hypertension; B12 deficiency; Vertigo; Episodic tension-type headache, not intractable; and Arthralgia of left knee on their problem list..   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   She presents with chief complaint of URI (Pt c/o of ear pain, headache and dizziness for a few week. Pt seen Rosina Senters NP on 04/27/2024 with no improvement. Pt is requesting head scan due to excessive pressure. Pt used OTC medications that was advised and finished azithromycin  (ZITHROMAX ) 250 MG tablet. //HM Due- shingles vaccine and mammogram ) .   Discussed the use of AI scribe software for clinical note transcription with the patient, who gave verbal consent to proceed.  History of Present Illness Laura Maynard is a 78 year old female who presents with persistent headache, ear pain, and sinus pressure.  She has been experiencing ear pain, headache, and sinus pressure for a couple of weeks. The headache is described as dull and persistent, differing from her usual migraines. The pain is located in her head, ears, and jaws, and she has been using heat on her ears for relief. She also reports dental sensitivity and a sensation of fullness in her head and ears.  She  has a history of  migraines but states that this headache feels different, describing it as 'cloudy' and 'about to explode'. On Saturday, she experienced a severe headache that she describes as a 'migraine mixed with everything else'.  She reports a visual change described as an aura, where faces on TV appeared distorted, but this resolved gradually. No fever. She has tried over-the-counter medications like Tylenol and ibuprofen  without significant relief.  She has a known penicillin allergy and allergies to sulfasalate and doxycycline . She previously took a Z-Pak (azithromycin ) without improvement. She mentions using chloracetin and dry eye treatments as recommended by a nurse practitioner she saw previously.  During the review of symptoms, she denies any fever but reports vision changes associated with her headache. She has poor vision in her left eye, which is her baseline, and experienced a headache with visual changes in her right eye on Saturday.     ROS  Past Surgical History:  Procedure Laterality Date   BACK SURGERY  1970   CATARACT EXTRACTION Left 12/2017   COLONOSCOPY  2006; 03/28/11   2 small adenomas; normal   EYE SURGERY  1964   right eye   KNEE ARTHROSCOPY  2002   NASAL SEPTUM SURGERY  1998   TOTAL ABDOMINAL HYSTERECTOMY  2000    Outpatient Medications Prior to Visit  Medication Sig Dispense Refill   Acetaminophen 325 MG CAPS Take by mouth.     AYR SALINE NASAL NA Place into the nose.     butalbital -acetaminophen-caffeine  (FIORICET) 50-325-40 MG tablet TAKE 1 TABLET  EVERY 4 HOURS AS NEEDED FOR SEVERE HEADACHE                                                                               /                                      TAKE                                                   BY                                        MOUTH 30 tablet 0   cholecalciferol (VITAMIN D3) 25 MCG (1000 UNIT) tablet Take 1 tablet (1,000 Units total) by mouth 2 (two) times daily with a meal.      clobetasol  (TEMOVATE ) 0.05 % external solution Apply 1 Application topically 2 (two) times daily. use one or two times a day PRN to affected areas 50 mL 0   Cyanocobalamin  (VITAMIN B 12 PO) Take by mouth.     cycloSPORINE (RESTASIS) 0.05 % ophthalmic emulsion Apply to eye.     diclofenac  Sodium (VOLTAREN ) 1 % GEL Apply 4 g topically 4 (four) times daily as needed. 100 g 3   fluticasone  (FLONASE ) 50 MCG/ACT nasal spray SHAKE LIQUID AND USE 1 TO 2  SPRAYS IN EACH NOSTRIL TWICE DAILY AS NEEDED FOR ALLERGIES     inclisiran (LEQVIO ) 284 MG/1.5ML SOSY injection Inject 284mg  SQ at month 0 and month 3, then every 6 months 1.5 mL 2   levothyroxine  (SYNTHROID ) 50 MCG tablet Take 1.5 tablets (75 mcg total) by mouth daily before breakfast. 150 tablet 3   meclizine  (ANTIVERT ) 25 MG tablet Take      1 tablet      3 x day       if needed for Dizziness /Vertigo 100 tablet 0   metroNIDAZOLE  (METROGEL ) 1 % gel Apply topically daily. Apply  Daily  to Acne Rosacea rash 60 g 3   Naproxen Sodium (ALEVE PO) Take by mouth. PRN     NEOMYCIN -POLYMYXIN-HYDROCORTISONE  (CORTISPORIN) 1 % SOLN OTIC solution Apply  6 to 8 drops to affected ear 3 to 4 x /day 10 mL 3   Vitamin D , Ergocalciferol , (DRISDOL ) 1.25 MG (50000 UNIT) CAPS capsule Take 1 capsule (50,000 Units total) by mouth every 7 (seven) days. 12 capsule 3   predniSONE  (STERAPRED UNI-PAK 21 TAB) 5 MG (21) TBPK tablet Take by mouth as directed.     No facility-administered medications prior to visit.    Family History  Problem Relation Age of Onset   Lung cancer Sister 74   Throat cancer Brother 80   Stroke Mother    Emphysema Father    COPD Sister    Diabetes Brother    Colon cancer Neg Hx     Social History   Socioeconomic History   Marital status: Divorced    Spouse name: Not on file   Number of children: 0   Years of education: college   Highest education level: Not on file  Occupational History    Comment: Retired  Tobacco Use   Smoking status:  Never   Smokeless tobacco: Never  Vaping Use   Vaping status: Never Used  Substance and Sexual Activity   Alcohol use: Never    Alcohol/week: 1.0 standard drink of alcohol    Types: 1 Standard drinks or equivalent per week    Comment: RARE   Drug use: Never   Sexual activity: Not Currently  Other Topics Concern   Not on file  Social History Narrative   Patient is retired and lives at home alone. Patient  has college education.   Caffeine - 3 cups of caffeine  daily.  One soda daily.   Right handed.   Social Drivers of Corporate investment banker Strain: Not on file  Food Insecurity: Not on file  Transportation Needs: Not on file  Physical Activity: Not on file  Stress: Not on file  Social Connections: Not on file  Intimate Partner Violence: Not on file                                                                                                  Objective:  Physical Exam: BP 130/75 (BP Location: Left Arm, Patient Position: Sitting, Cuff Size: Large)   Pulse 75   Temp 98.2 F (36.8 C) (Oral)   Resp 18  Wt 163 lb 3.2 oz (74 kg)   SpO2 98%   BMI 26.34 kg/m    Physical Exam GENERAL: Alert, cooperative, well developed, no acute distress. HEENT: Normocephalic, normal oropharynx, moist mucous membranes, eyes bloodshot, PERRLA NECK: No specific findings. CHEST: Clear to auscultation bilaterally, no wheezes, rhonchi, or crackles. CARDIOVASCULAR: Normal heart rate and rhythm, S1 and S2 normal without murmurs. ABDOMEN: Soft, non-tender, non-distended, without organomegaly, normal bowel sounds. EXTREMITIES: No cyanosis or edema. NEUROLOGICAL: Cranial nerves grossly intact, moves all extremities without gross motor or sensory deficit.   Physical Exam  No results found.  Recent Results (from the past 2160 hours)  NMR, lipoprofile     Status: Abnormal   Collection Time: 02/19/24  9:30 AM  Result Value Ref Range   LDL Particle Number 1,034 (H) <1,000 nmol/L     Comment:                           Low                   < 1000                           Moderate         1000 - 1299                           Borderline-High  1300 - 1599                           High             1600 - 2000                           Very High             > 2000    LDL-C (NIH Calc) 130 (H) 0 - 99 mg/dL    Comment:                           Optimal               <  100                           Above optimal     100 -  129                           Borderline        130 -  159                           High              160 -  189                           Very high             >  189    HDL-C 118 >39 mg/dL   Triglycerides 67 0 - 149 mg/dL   Cholesterol, Total 740 (H) 100 - 199 mg/dL   HDL Particle Number 63.0 >=30.5 umol/L  Small LDL Particle Number <90 <=527 nmol/L   LDL Size 21.5 >20.5 nm    Comment:  ----------------------------------------------------------                  ** INTERPRETATIVE INFORMATION**                  PARTICLE CONCENTRATION AND SIZE                     <--Lower CVD Risk   Higher CVD Risk-->   LDL AND HDL PARTICLES   Percentile in Reference Population   HDL-P (total)        High     75th    50th    25th   Low                        >34.9    34.9    30.5    26.7   <26.7   Small LDL-P          Low      25th    50th    75th   High                        <117     117     527     839    >839   LDL Size   <-Large (Pattern A)->    <-Small (Pattern B)->                     23.0    20.6           20.5      19.0  ---------------------------------------------------------- Small LDL-P and LDL Size are associated with CVD risk, but not after LDL-P is taken into account.    LP-IR Score <25 <=45    Comment: INSULIN  RESISTANCE MARKER     Insulin  Sensitive    Insulin  Resistant-->            Percentile in Reference Population Insulin  Resistance Score LP-IR Score   Low   25th   50th   75th   High               <27   27     45     63     >63 LP-IR  Score is inaccurate if patient is non-fasting. The LP-IR score is a laboratory developed index that has been associated with insulin  resistance and diabetes risk and should be used as one component of a physician's clinical assessment.   CBC with Differential/Platelet     Status: Abnormal   Collection Time: 02/19/24  9:30 AM  Result Value Ref Range   WBC 7.7 4.0 - 10.5 K/uL   RBC 4.67 3.87 - 5.11 Mil/uL   Hemoglobin 12.4 12.0 - 15.0 g/dL   HCT 61.3 63.9 - 53.9 %   MCV 82.6 78.0 - 100.0 fl   MCHC 32.2 30.0 - 36.0 g/dL   RDW 85.2 88.4 - 84.4 %   Platelets 407.0 (H) 150.0 - 400.0 K/uL   Neutrophils Relative % 62.9 43.0 - 77.0 %   Lymphocytes Relative 26.2 12.0 - 46.0 %   Monocytes Relative 10.3 3.0 - 12.0 %   Eosinophils Relative 0.2 0.0 - 5.0 %   Basophils Relative 0.4 0.0 - 3.0 %   Neutro Abs 4.8 1.4 - 7.7 K/uL   Lymphs Abs 2.0  0.7 - 4.0 K/uL   Monocytes Absolute 0.8 0.1 - 1.0 K/uL   Eosinophils Absolute 0.0 0.0 - 0.7 K/uL   Basophils Absolute 0.0 0.0 - 0.1 K/uL  Lipid panel     Status: Abnormal   Collection Time: 02/19/24  9:30 AM  Result Value Ref Range   Cholesterol 259 (H) 0 - 200 mg/dL    Comment: ATP III Classification       Desirable:  < 200 mg/dL               Borderline High:  200 - 239 mg/dL          High:  > = 759 mg/dL   Triglycerides 37.9 0.0 - 149.0 mg/dL    Comment: Normal:  <849 mg/dLBorderline High:  150 - 199 mg/dL   HDL 882.89 >60.99 mg/dL   VLDL 87.5 0.0 - 59.9 mg/dL   LDL Cholesterol 869 (H) 0 - 99 mg/dL   Total CHOL/HDL Ratio 2     Comment:                Men          Women1/2 Average Risk     3.4          3.3Average Risk          5.0          4.42X Average Risk          9.6          7.13X Average Risk          15.0          11.0                       NonHDL 142.02     Comment: NOTE:  Non-HDL goal should be 30 mg/dL higher than patient's LDL goal (i.e. LDL goal of < 70 mg/dL, would have non-HDL goal of < 100 mg/dL)  Magnesium      Status: None   Collection  Time: 02/19/24  9:30 AM  Result Value Ref Range   Magnesium  2.3 1.5 - 2.5 mg/dL  Hemoglobin J8r     Status: None   Collection Time: 02/19/24  9:30 AM  Result Value Ref Range   Hgb A1c MFr Bld 5.6 4.6 - 6.5 %    Comment: Glycemic Control Guidelines for People with Diabetes:Non Diabetic:  <6%Goal of Therapy: <7%Additional Action Suggested:  >8%   Insulin , random     Status: None   Collection Time: 02/19/24  9:30 AM  Result Value Ref Range   Insulin  14.8 uIU/mL    Comment:       Reference Range  < or = 18.4 .       Risk:       Optimal          < or = 18.4       Moderate         NA       High             >18.4 .       Adult cardiovascular event risk category       cut points (optimal, moderate, high)       are based on Insulin  Reference Interval       studies performed at [facility]       in 2022. SABRA   Urinalysis w microscopic + reflex cultur     Status: Abnormal  Collection Time: 02/19/24  9:30 AM   Specimen: Blood  Result Value Ref Range   Color, Urine YELLOW YELLOW   APPearance CLEAR CLEAR   Specific Gravity, Urine 1.020 1.001 - 1.035   pH < OR = 5.0 (A) 5.0 - 8.0   Glucose, UA NEGATIVE NEGATIVE   Bilirubin Urine NEGATIVE NEGATIVE   Ketones, ur NEGATIVE NEGATIVE   Hgb urine dipstick 1+ (A) NEGATIVE   Protein, ur NEGATIVE NEGATIVE   Nitrites, Initial NEGATIVE NEGATIVE   Leukocyte Esterase NEGATIVE NEGATIVE   WBC, UA NONE SEEN 0 - 5 /HPF   RBC / HPF 10-20 (A) 0 - 2 /HPF   Squamous Epithelial / HPF NONE SEEN < OR = 5 /HPF   Bacteria, UA FEW (A) NONE SEEN /HPF   Hyaline Cast NONE SEEN NONE SEEN /LPF   Note      Comment: This urine was analyzed for the presence of WBC,  RBC, bacteria, casts, and other formed elements.  Only those elements seen were reported. SABRA .   Microalbumin / creatinine urine ratio     Status: Abnormal   Collection Time: 02/19/24  9:30 AM  Result Value Ref Range   Microalb, Ur 3.3 (H) 0.0 - 1.9 mg/dL   Creatinine,U 844.9 mg/dL   Microalb  Creat Ratio 21.4 0.0 - 30.0 mg/g  Thyroid  Panel With TSH     Status: None   Collection Time: 02/19/24  9:30 AM  Result Value Ref Range   T3 Uptake 31 22 - 35 %   T4, Total 8.3 5.1 - 11.9 mcg/dL   Free Thyroxine Index 2.6 1.4 - 3.8   TSH 1.01 0.40 - 4.50 mIU/L  B12 and Folate Panel     Status: None   Collection Time: 02/19/24  9:30 AM  Result Value Ref Range   Vitamin B-12 404 211 - 911 pg/mL   Folate 8.6 >5.9 ng/mL  REFLEXIVE URINE CULTURE     Status: None   Collection Time: 02/19/24  9:30 AM  Result Value Ref Range   Reflexve Urine Culture      Comment: NO CULTURE INDICATED  Extra Specimen     Status: None   Collection Time: 02/19/24  9:30 AM  Result Value Ref Range   Extra tube recieved      Comment: An extra specimen was received with no test requested. The specimen will be maintained in storage in case  additional testing is needed. Please call the client service department for further assistance. SABRA    Specimen type recieved Green Top   COMPLETE METABOLIC PANEL WITHOUT GFR     Status: Abnormal   Collection Time: 02/19/24  9:35 AM  Result Value Ref Range   Glucose, Bld 83 65 - 99 mg/dL    Comment: .            Fasting reference interval .    BUN 16 7 - 25 mg/dL   Creat 9.12 9.39 - 8.99 mg/dL   BUN/Creatinine Ratio SEE NOTE: 6 - 22 (calc)    Comment:    Not Reported: BUN and Creatinine are within    reference range. .    Sodium 149 (H) 135 - 146 mmol/L    Comment: Verified by repeat analysis. .    Potassium 4.6 3.5 - 5.3 mmol/L   Chloride 111 (H) 98 - 110 mmol/L    Comment: . Result consistent with prolonged exposure to red blood cells. Interpret result with caution. SABRA    CO2  20 20 - 32 mmol/L   Calcium  9.5 8.6 - 10.4 mg/dL   Total Protein 7.5 6.1 - 8.1 g/dL   Albumin 4.9 3.6 - 5.1 g/dL   Globulin 2.6 1.9 - 3.7 g/dL (calc)   AG Ratio 1.9 1.0 - 2.5 (calc)   Total Bilirubin 0.3 0.2 - 1.2 mg/dL   Alkaline phosphatase (APISO) 112 37 - 153 U/L   AST 16 10 -  35 U/L   ALT 9 6 - 29 U/L  VITAMIN D  25 Hydroxy (Vit-D Deficiency, Fractures)     Status: Abnormal   Collection Time: 02/19/24  9:35 AM  Result Value Ref Range   VITD 18.66 (L) 30.00 - 100.00 ng/mL  POCT Influenza A/B     Status: Normal   Collection Time: 05/11/24  4:52 PM  Result Value Ref Range   Influenza A, POC Negative Negative   Influenza B, POC Negative Negative  POC COVID-19 BinaxNow     Status: Normal   Collection Time: 05/11/24  4:53 PM  Result Value Ref Range   SARS Coronavirus 2 Ag Negative Negative        Beverley Adine Hummer, MD, MS

## 2024-05-11 NOTE — Patient Instructions (Addendum)
  VISIT SUMMARY: Today, you were seen for persistent headache, ear pain, and sinus pressure that have been troubling you for a couple of weeks. You described the headache as dull and different from your usual migraines, with additional symptoms including dental sensitivity and a sensation of fullness in your head and ears. You also experienced a visual change described as an aura, which has since resolved. You have tried over-the-counter medications without significant relief.  YOUR PLAN: -HEADACHE WITH VISION CHANGES, POSSIBLE TEMPORAL ARTERITIS VERSUS ACUTE SINUSITIS: Your symptoms may be due to either acute sinusitis or temporal arteritis. Acute sinusitis is an infection or inflammation of the sinuses, while temporal arteritis is an inflammation of the blood vessels in your temples, which can lead to vision loss if not treated promptly. We have ordered blood tests (CBC, ESR, and CRP) to check for inflammation, a CT scan of your head, and an MRI of your brain to rule out other issues. You will start taking prednisone  40 mg daily for 10 days and cefuroxime  500 mg twice daily. We have also referred you to an eye specialist urgently. Please follow up in 3 days and go to the emergency department if your headache worsens, your vision changes, or you develop a severe fever.  -MIGRAINE (HISTORY OF): Your current headache is different from your usual migraines, presenting as a dull, persistent headache with additional symptoms of ear pain and sinus pressure. We are focusing on treating the potential causes of your current symptoms.  INSTRUCTIONS: Please follow up in 3 days. If your headache worsens, your vision changes, or you develop a severe fever, go to the emergency department immediately.

## 2024-05-12 ENCOUNTER — Telehealth: Payer: Self-pay | Admitting: Family Medicine

## 2024-05-12 ENCOUNTER — Telehealth: Payer: Self-pay

## 2024-05-12 ENCOUNTER — Ambulatory Visit: Payer: Self-pay | Admitting: Family Medicine

## 2024-05-12 LAB — CBC WITH DIFFERENTIAL/PLATELET
Absolute Lymphocytes: 1918 {cells}/uL (ref 850–3900)
Absolute Monocytes: 608 {cells}/uL (ref 200–950)
Basophils Absolute: 47 {cells}/uL (ref 0–200)
Basophils Relative: 0.8 %
Eosinophils Absolute: 71 {cells}/uL (ref 15–500)
Eosinophils Relative: 1.2 %
HCT: 37.5 % (ref 35.0–45.0)
Hemoglobin: 12 g/dL (ref 11.7–15.5)
MCH: 27.5 pg (ref 27.0–33.0)
MCHC: 32 g/dL (ref 32.0–36.0)
MCV: 85.8 fL (ref 80.0–100.0)
MPV: 9.3 fL (ref 7.5–12.5)
Monocytes Relative: 10.3 %
Neutro Abs: 3257 {cells}/uL (ref 1500–7800)
Neutrophils Relative %: 55.2 %
Platelets: 389 Thousand/uL (ref 140–400)
RBC: 4.37 Million/uL (ref 3.80–5.10)
RDW: 13.3 % (ref 11.0–15.0)
Total Lymphocyte: 32.5 %
WBC: 5.9 Thousand/uL (ref 3.8–10.8)

## 2024-05-12 LAB — HIGH SENSITIVITY CRP: hs-CRP: 0.6 mg/L

## 2024-05-12 LAB — SEDIMENTATION RATE: Sed Rate: 17 mm/h (ref 0–30)

## 2024-05-12 NOTE — Telephone Encounter (Signed)
 Please see message below. Patient is requesting MRI location change.

## 2024-05-12 NOTE — Telephone Encounter (Unsigned)
 Copied from CRM [id number]. Topic: General - Other >> May 12, 2024  8:57 AM Gennette ORN wrote: Reason for CRM: Delaine Maris [facility] [phone] is calling to speak to someone about radiology for the patient. Please call back.

## 2024-05-12 NOTE — Telephone Encounter (Signed)
 Copied from CRM [id number]. Topic: Clinical - Request for Lab/Test Order >> May 12, 2024 10:29 AM Berneda FALCON wrote: Reason for CRM: Patient states MRI was ordered but sent to [place] and she wants it to be sent to [facility] where it is closer to her please. If she does not answer, please leave her a message.They have her down for Sunday in [place] so if the new place can't get her in soon,she will keep her appt in [facility] if needed.  Patient callback is [phone]

## 2024-05-13 NOTE — Telephone Encounter (Signed)
 Returned phone call; left VM for call back.

## 2024-05-14 ENCOUNTER — Ambulatory Visit: Admitting: Family Medicine

## 2024-05-14 ENCOUNTER — Encounter: Payer: Self-pay | Admitting: Family Medicine

## 2024-05-14 VITALS — BP 112/71 | HR 80 | Temp 98.0°F | Resp 18 | Wt 163.3 lb

## 2024-05-14 DIAGNOSIS — R519 Headache, unspecified: Secondary | ICD-10-CM

## 2024-05-14 NOTE — Progress Notes (Addendum)
 Assessment & Plan   Assessment/Plan:    Problem List Items Addressed This Visit   None       Assessment and Plan Assessment & Plan       There are no discontinued medications.  No follow-ups on file.        Subjective:   Encounter date: 05/14/2024  Laura Maynard is a 78 y.o. female who has Labile hypertension; History of prediabetes; Vitamin D  deficiency; Hyperlipidemia LDL goal <70; Poor compliance with medication; Benign paroxysmal positional vertigo; TMJ (temporomandibular joint syndrome); Postablative hypothyroidism; BMI 27.0-27.9,adult; Medication Phobia; Multinodular goiter; FH: hypertension; Abnormal glucose; Statin myopathy; Migraine without aura and without status migrainosus, not intractable; Insomnia; Right sided sciatica; Sialectasis; Seasonal allergic conjunctivitis; Pseudophakia of left eye; Dry eye syndrome, bilateral; Cataract, nuclear sclerotic, left eye; Cataract cortical, senile, left; Blood in urine; Aphakia, right eye; Osteoarthritis of knees, bilateral; Change in bowel habits; Seborrheic dermatitis of scalp; Alopecia; Blindness, one eye, low vision other eye, unspecified eyes; Gastrointestinal complaint; Essential hypertension; B12 deficiency; Vertigo; Episodic tension-type headache, not intractable; and Arthralgia of left knee on their problem list..   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   She presents with chief complaint of URI (3 day follow up. Pt stated symptoms have improved since last visit but is still experiencing a dull headache with ear pain; she is currently still taking the antibiotics that was prescribed. MRI scheduled for 05/17/2024 @ 8am//Pt stated side effects with antibiotics (diarrhea)  ) .    Discussed the use of AI scribe software for clinical note transcription with the patient, who gave verbal consent to proceed.  History of Present Illness      ROS  Past Surgical History:  Procedure Laterality Date  . BACK SURGERY  1970  . CATARACT EXTRACTION Left 12/2017  . COLONOSCOPY  2006; 03/28/11   2 small adenomas; normal  . EYE SURGERY  1964   right eye  . KNEE ARTHROSCOPY  2002  . NASAL SEPTUM SURGERY  1998  . TOTAL ABDOMINAL HYSTERECTOMY  2000    Outpatient Medications Prior to Visit  Medication Sig Dispense Refill  . Acetaminophen 325 MG CAPS Take by mouth.    . AYR SALINE NASAL NA Place into the nose.    . butalbital -acetaminophen-caffeine  (FIORICET) 50-325-40 MG tablet TAKE 1 TABLET  EVERY 4 HOURS AS NEEDED FOR SEVERE HEADACHE                                                                               /                                      TAKE  BY                                        MOUTH 30 tablet 0  . cefUROXime  (CEFTIN ) 500 MG tablet Take 1 tablet (500 mg total) by mouth 2 (two) times daily with a meal for 10 days. 20 tablet 0  . cholecalciferol (VITAMIN D3) 25 MCG (1000 UNIT) tablet Take 1 tablet (1,000 Units total) by mouth 2 (two) times daily with a meal.    . clobetasol  (TEMOVATE ) 0.05 % external solution Apply 1 Application topically 2 (two) times daily. use one or two times a day PRN to affected areas 50 mL 0  . Cyanocobalamin  (VITAMIN B 12 PO) Take by mouth.    . cycloSPORINE (RESTASIS) 0.05 % ophthalmic emulsion Apply to eye.    . diclofenac  Sodium (VOLTAREN ) 1 % GEL Apply 4 g topically 4 (four) times daily as needed. 100 g 3  . fluticasone  (FLONASE ) 50 MCG/ACT nasal spray SHAKE LIQUID AND USE 1 TO 2 SPRAYS IN EACH NOSTRIL TWICE DAILY AS NEEDED FOR ALLERGIES    . inclisiran (LEQVIO ) 284 MG/1.5ML SOSY injection Inject 284mg  SQ at month 0 and month 3, then every 6 months 1.5 mL 2  . levothyroxine   (SYNTHROID ) 50 MCG tablet Take 1.5 tablets (75 mcg total) by mouth daily before breakfast. 150 tablet 3  . meclizine  (ANTIVERT ) 25 MG tablet Take      1 tablet      3 x day       if needed for Dizziness /Vertigo 100 tablet 0  . metroNIDAZOLE  (METROGEL ) 1 % gel Apply topically daily. Apply  Daily  to Acne Rosacea rash 60 g 3  . Naproxen Sodium (ALEVE PO) Take by mouth. PRN    . NEOMYCIN -POLYMYXIN-HYDROCORTISONE  (CORTISPORIN) 1 % SOLN OTIC solution Apply  6 to 8 drops to affected ear 3 to 4 x /day 10 mL 3  . predniSONE  (DELTASONE ) 20 MG tablet Take 2 tablets (40 mg total) by mouth daily with breakfast for 10 days. 20 tablet 0  . Vitamin D , Ergocalciferol , (DRISDOL ) 1.25 MG (50000 UNIT) CAPS capsule Take 1 capsule (50,000 Units total) by mouth every 7 (seven) days. 12 capsule 3   No facility-administered medications prior to visit.    Family History  Problem Relation Age of Onset  . Lung cancer Sister 28  . Throat cancer Brother 51  . Stroke Mother   . Emphysema Father   . COPD Sister   . Diabetes Brother   . Colon cancer Neg Hx     Social History   Socioeconomic History  . Marital status: Divorced    Spouse name: Not on file  . Number of children: 0  . Years of education: college  . Highest education level: Not on file  Occupational History    Comment: Retired  Tobacco Use  . Smoking status: Never  . Smokeless tobacco: Never  Vaping Use  . Vaping status: Never Used  Substance and Sexual Activity  . Alcohol use: Never    Alcohol/week: 1.0 standard drink of alcohol    Types: 1 Standard drinks or equivalent per week    Comment: RARE  . Drug use: Never  . Sexual activity: Not Currently  Other Topics Concern  . Not on file  Social History Narrative   Patient is retired and lives at home alone. Patient  has college education.  Caffeine - 3 cups of caffeine  daily.  One soda daily.   Right handed.   Social Drivers of Corporate investment banker Strain: Not on file  Food  Insecurity: Not on file  Transportation Needs: Not on file  Physical Activity: Not on file  Stress: Not on file  Social Connections: Not on file  Intimate Partner Violence: Not on file                                                                                                  Objective:  Physical Exam: BP 112/71 (BP Location: Left Arm, Patient Position: Sitting, Cuff Size: Normal)   Pulse 80   Temp 98 F (36.7 C) (Oral)   Resp 18   Wt 163 lb 4.8 oz (74.1 kg)   SpO2 98%   BMI 26.36 kg/m    Physical Exam    Physical Exam  No results found.  Recent Results (from the past 2160 hours)  NMR, lipoprofile     Status: Abnormal   Collection Time: 02/19/24  9:30 AM  Result Value Ref Range   LDL Particle Number 1,034 (H) <1,000 nmol/L    Comment:                           Low                   < 1000                           Moderate         1000 - 1299                           Borderline-High  1300 - 1599                           High             1600 - 2000                           Very High             > 2000    LDL-C (NIH Calc) 130 (H) 0 - 99 mg/dL    Comment:                           Optimal               <  100                           Above optimal     100 -  129                           Borderline        130 -  159                           High              160 -  189                           Very high             >  189    HDL-C 118 >39 mg/dL   Triglycerides 67 0 - 149 mg/dL   Cholesterol, Total 740 (H) 100 - 199 mg/dL   HDL Particle Number 63.0 >=30.5 umol/L   Small LDL Particle Number <90 <=527 nmol/L   LDL Size 21.5 >20.5 nm    Comment:  ----------------------------------------------------------                  ** INTERPRETATIVE INFORMATION**                  PARTICLE CONCENTRATION AND SIZE                     <--Lower CVD Risk   Higher CVD Risk-->   LDL AND HDL PARTICLES   Percentile in Reference Population   HDL-P (total)        High      75th    50th    25th   Low                        >34.9    34.9    30.5    26.7   <26.7   Small LDL-P          Low      25th    50th    75th   High                        <117     117     527     839    >839   LDL Size   <-Large (Pattern A)->    <-Small (Pattern B)->                     23.0    20.6           20.5      19.0  ---------------------------------------------------------- Small LDL-P and LDL Size are associated with CVD risk, but not after LDL-P is taken into account.    LP-IR Score <25 <=45    Comment: INSULIN  RESISTANCE MARKER     Insulin  Sensitive    Insulin  Resistant-->            Percentile in Reference Population Insulin  Resistance Score LP-IR Score   Low   25th   50th   75th   High               <27   27     45     63     >63 LP-IR Score is inaccurate if patient is non-fasting. The LP-IR score is a laboratory developed index that has been associated with insulin  resistance and diabetes risk and should be used as one component of a physician's clinical assessment.   CBC with Differential/Platelet     Status: Abnormal   Collection Time: 02/19/24  9:30 AM  Result Value Ref Range  WBC 7.7 4.0 - 10.5 K/uL   RBC 4.67 3.87 - 5.11 Mil/uL   Hemoglobin 12.4 12.0 - 15.0 g/dL   HCT 61.3 63.9 - 53.9 %   MCV 82.6 78.0 - 100.0 fl   MCHC 32.2 30.0 - 36.0 g/dL   RDW 85.2 88.4 - 84.4 %   Platelets 407.0 (H) 150.0 - 400.0 K/uL   Neutrophils Relative % 62.9 43.0 - 77.0 %   Lymphocytes Relative 26.2 12.0 - 46.0 %   Monocytes Relative 10.3 3.0 - 12.0 %   Eosinophils Relative 0.2 0.0 - 5.0 %   Basophils Relative 0.4 0.0 - 3.0 %   Neutro Abs 4.8 1.4 - 7.7 K/uL   Lymphs Abs 2.0 0.7 - 4.0 K/uL   Monocytes Absolute 0.8 0.1 - 1.0 K/uL   Eosinophils Absolute 0.0 0.0 - 0.7 K/uL   Basophils Absolute 0.0 0.0 - 0.1 K/uL  Lipid panel     Status: Abnormal   Collection Time: 02/19/24  9:30 AM  Result Value Ref Range   Cholesterol 259 (H) 0 - 200 mg/dL    Comment: ATP III  Classification       Desirable:  < 200 mg/dL               Borderline High:  200 - 239 mg/dL          High:  > = 759 mg/dL   Triglycerides 37.9 0.0 - 149.0 mg/dL    Comment: Normal:  <849 mg/dLBorderline High:  150 - 199 mg/dL   HDL 882.89 >60.99 mg/dL   VLDL 87.5 0.0 - 59.9 mg/dL   LDL Cholesterol 869 (H) 0 - 99 mg/dL   Total CHOL/HDL Ratio 2     Comment:                Men          Women1/2 Average Risk     3.4          3.3Average Risk          5.0          4.42X Average Risk          9.6          7.13X Average Risk          15.0          11.0                       NonHDL 142.02     Comment: NOTE:  Non-HDL goal should be 30 mg/dL higher than patient's LDL goal (i.e. LDL goal of < 70 mg/dL, would have non-HDL goal of < 100 mg/dL)  Magnesium      Status: None   Collection Time: 02/19/24  9:30 AM  Result Value Ref Range   Magnesium  2.3 1.5 - 2.5 mg/dL  Hemoglobin J8r     Status: None   Collection Time: 02/19/24  9:30 AM  Result Value Ref Range   Hgb A1c MFr Bld 5.6 4.6 - 6.5 %    Comment: Glycemic Control Guidelines for People with Diabetes:Non Diabetic:  <6%Goal of Therapy: <7%Additional Action Suggested:  >8%   Insulin , random     Status: None   Collection Time: 02/19/24  9:30 AM  Result Value Ref Range   Insulin  14.8 uIU/mL    Comment:       Reference Range  < or = 18.4 .       Risk:  Optimal          < or = 18.4       Moderate         NA       High             >18.4 .       Adult cardiovascular event risk category       cut points (optimal, moderate, high)       are based on Insulin  Reference Interval       studies performed at [facility]       in 2022. SABRA   Urinalysis w microscopic + reflex cultur     Status: Abnormal   Collection Time: 02/19/24  9:30 AM   Specimen: Blood  Result Value Ref Range   Color, Urine YELLOW YELLOW   APPearance CLEAR CLEAR   Specific Gravity, Urine 1.020 1.001 - 1.035   pH < OR = 5.0 (A) 5.0 - 8.0   Glucose, UA NEGATIVE NEGATIVE    Bilirubin Urine NEGATIVE NEGATIVE   Ketones, ur NEGATIVE NEGATIVE   Hgb urine dipstick 1+ (A) NEGATIVE   Protein, ur NEGATIVE NEGATIVE   Nitrites, Initial NEGATIVE NEGATIVE   Leukocyte Esterase NEGATIVE NEGATIVE   WBC, UA NONE SEEN 0 - 5 /HPF   RBC / HPF 10-20 (A) 0 - 2 /HPF   Squamous Epithelial / HPF NONE SEEN < OR = 5 /HPF   Bacteria, UA FEW (A) NONE SEEN /HPF   Hyaline Cast NONE SEEN NONE SEEN /LPF   Note      Comment: This urine was analyzed for the presence of WBC,  RBC, bacteria, casts, and other formed elements.  Only those elements seen were reported. SABRA .   Microalbumin / creatinine urine ratio     Status: Abnormal   Collection Time: 02/19/24  9:30 AM  Result Value Ref Range   Microalb, Ur 3.3 (H) 0.0 - 1.9 mg/dL   Creatinine,U 844.9 mg/dL   Microalb Creat Ratio 21.4 0.0 - 30.0 mg/g  Thyroid  Panel With TSH     Status: None   Collection Time: 02/19/24  9:30 AM  Result Value Ref Range   T3 Uptake 31 22 - 35 %   T4, Total 8.3 5.1 - 11.9 mcg/dL   Free Thyroxine Index 2.6 1.4 - 3.8   TSH 1.01 0.40 - 4.50 mIU/L  B12 and Folate Panel     Status: None   Collection Time: 02/19/24  9:30 AM  Result Value Ref Range   Vitamin B-12 404 211 - 911 pg/mL   Folate 8.6 >5.9 ng/mL  REFLEXIVE URINE CULTURE     Status: None   Collection Time: 02/19/24  9:30 AM  Result Value Ref Range   Reflexve Urine Culture      Comment: NO CULTURE INDICATED  Extra Specimen     Status: None   Collection Time: 02/19/24  9:30 AM  Result Value Ref Range   Extra tube recieved      Comment: An extra specimen was received with no test requested. The specimen will be maintained in storage in case  additional testing is needed. Please call the client service department for further assistance. SABRA    Specimen type recieved Green Top   COMPLETE METABOLIC PANEL WITHOUT GFR     Status: Abnormal   Collection Time: 02/19/24  9:35 AM  Result Value Ref Range   Glucose, Bld 83 65 - 99 mg/dL    Comment: .  Fasting reference interval .    BUN 16 7 - 25 mg/dL   Creat 9.12 9.39 - 8.99 mg/dL   BUN/Creatinine Ratio SEE NOTE: 6 - 22 (calc)    Comment:    Not Reported: BUN and Creatinine are within    reference range. .    Sodium 149 (H) 135 - 146 mmol/L    Comment: Verified by repeat analysis. .    Potassium 4.6 3.5 - 5.3 mmol/L   Chloride 111 (H) 98 - 110 mmol/L    Comment: . Result consistent with prolonged exposure to red blood cells. Interpret result with caution. .    CO2 20 20 - 32 mmol/L   Calcium  9.5 8.6 - 10.4 mg/dL   Total Protein 7.5 6.1 - 8.1 g/dL   Albumin 4.9 3.6 - 5.1 g/dL   Globulin 2.6 1.9 - 3.7 g/dL (calc)   AG Ratio 1.9 1.0 - 2.5 (calc)   Total Bilirubin 0.3 0.2 - 1.2 mg/dL   Alkaline phosphatase (APISO) 112 37 - 153 U/L   AST 16 10 - 35 U/L   ALT 9 6 - 29 U/L  VITAMIN D  25 Hydroxy (Vit-D Deficiency, Fractures)     Status: Abnormal   Collection Time: 02/19/24  9:35 AM  Result Value Ref Range   VITD 18.66 (L) 30.00 - 100.00 ng/mL  POCT Influenza A/B     Status: Normal   Collection Time: 05/11/24  4:52 PM  Result Value Ref Range   Influenza A, POC Negative Negative   Influenza B, POC Negative Negative  POC COVID-19 BinaxNow     Status: Normal   Collection Time: 05/11/24  4:53 PM  Result Value Ref Range   SARS Coronavirus 2 Ag Negative Negative  CBC w/Diff     Status: None   Collection Time: 05/11/24  5:06 PM  Result Value Ref Range   WBC 5.9 3.8 - 10.8 Thousand/uL   RBC 4.37 3.80 - 5.10 Million/uL   Hemoglobin 12.0 11.7 - 15.5 g/dL   HCT 62.4 64.9 - 54.9 %   MCV 85.8 80.0 - 100.0 fL   MCH 27.5 27.0 - 33.0 pg   MCHC 32.0 32.0 - 36.0 g/dL    Comment: For adults, a slight decrease in the calculated MCHC value (in the range of 30 to 32 g/dL) is most likely not clinically significant; however, it should be interpreted with caution in correlation with other red cell parameters and the patient's clinical condition.    RDW 13.3 11.0 - 15.0 %    Platelets 389 140 - 400 Thousand/uL   MPV 9.3 7.5 - 12.5 fL   Neutro Abs 3,257 1,500 - 7,800 cells/uL   Absolute Lymphocytes 1,918 850 - 3,900 cells/uL   Absolute Monocytes 608 200 - 950 cells/uL   Eosinophils Absolute 71 15 - 500 cells/uL   Basophils Absolute 47 0 - 200 cells/uL   Neutrophils Relative % 55.2 %   Total Lymphocyte 32.5 %   Monocytes Relative 10.3 %   Eosinophils Relative 1.2 %   Basophils Relative 0.8 %  CRP High sensitivity     Status: None   Collection Time: 05/11/24  5:06 PM  Result Value Ref Range   hs-CRP 0.6 mg/L    Comment: Reference Range Optimal <1.0 Jellinger PS et al. Gaylene Pract.2017;23(Suppl 2):1-87. For ages >4 Years: hs-CRP mg/L  Risk According to AHA/CDC Guidelines <1.0         Lower relative cardiovascular risk. 1.0-3.0      Average  relative cardiovascular risk. 3.1-10.0     Higher relative cardiovascular risk.              Consider retesting in 1 to 2 weeks to              exclude a benign transient elevation              in the baseline CRP value secondary              to infection or inflammation. >10.0        Persistent elevation, upon retesting,              may be associated with infection and              inflammation. SABRA Sierra TA, Mensah GA, Alexander RW, et al. Markers of inflammation and cardiovascular disease:   application to clinical and public health practice: A statement for healthcare professionals from the  Centers for Disease Control and Prevention and the  American Heart Association. Circulation 2003; 107(3): L7973251.   Sedimentation rate     Status: None   Collection Time: 05/11/24  5:06 PM  Result Value Ref Range   Sed Rate 17 0 - 30 mm/h        Beverley Adine Hummer, MD, MS

## 2024-05-16 ENCOUNTER — Ambulatory Visit (HOSPITAL_BASED_OUTPATIENT_CLINIC_OR_DEPARTMENT_OTHER): Admission: RE | Admit: 2024-05-16 | Source: Ambulatory Visit

## 2024-05-17 ENCOUNTER — Ambulatory Visit (HOSPITAL_COMMUNITY)
Admission: RE | Admit: 2024-05-17 | Discharge: 2024-05-17 | Disposition: A | Discharge status: Home / Self Care | Source: Ambulatory Visit | Attending: Family Medicine | Admitting: Family Medicine

## 2024-05-17 DIAGNOSIS — R519 Headache, unspecified: Secondary | ICD-10-CM | POA: Diagnosis not present

## 2024-05-17 DIAGNOSIS — G319 Degenerative disease of nervous system, unspecified: Secondary | ICD-10-CM | POA: Diagnosis not present

## 2024-05-17 MED ORDER — GADOBUTROL 1 MMOL/ML IV SOLN
7.0000 mL | Freq: Once | INTRAVENOUS | Status: AC | PRN
  Administered 2024-05-17: 7 mL via INTRAVENOUS

## 2024-05-18 ENCOUNTER — Encounter: Payer: Self-pay | Admitting: Internal Medicine

## 2024-05-18 ENCOUNTER — Ambulatory Visit: Admitting: Internal Medicine

## 2024-05-18 VITALS — BP 110/68 | HR 68 | Ht 66.0 in | Wt 161.0 lb

## 2024-05-18 DIAGNOSIS — E042 Nontoxic multinodular goiter: Secondary | ICD-10-CM | POA: Diagnosis not present

## 2024-05-18 DIAGNOSIS — E890 Postprocedural hypothyroidism: Secondary | ICD-10-CM

## 2024-05-18 MED ORDER — LEVOTHYROXINE SODIUM 50 MCG PO TABS: 75.0000 ug | ORAL_TABLET | Freq: Every day | ORAL | 3 refills | Status: AC

## 2024-05-18 NOTE — Patient Instructions (Addendum)
 Please continue Levothyroxine  75 mcg daily.  Take the thyroid  hormone every day, with water, at least 30 minutes before breakfast, separated by at least 4 hours from: - acid reflux medications - calcium  - iron - multivitamins  Please come back for a follow-up appointment in 1 year.  Latest MRI: Narrative & Impression  CLINICAL DATA:  78 year old female with new onset headache, intractable headache. History of goiter, right eye blindness.   EXAM: MRI HEAD WITHOUT AND WITH CONTRAST   TECHNIQUE: Multiplanar, multiecho pulse sequences of the brain and surrounding structures were obtained without and with intravenous contrast.   CONTRAST:  7mL GADAVIST  GADOBUTROL  1 MMOL/ML IV SOLN   COMPARISON:  Head CT 11/07/2015.   FINDINGS: Brain: Normal cerebral volume. No restricted diffusion to suggest acute infarction. No midline shift, mass effect, evidence of mass lesion, ventriculomegaly, extra-axial collection or acute intracranial hemorrhage. Cervicomedullary junction and pituitary are within normal limits. Elnor and white matter signal is within normal limits for age throughout the brain. No chronic cerebral blood products. No cortical encephalomalacia. No abnormal enhancement identified. No dural thickening.   Vascular: Major intracranial vascular flow voids are preserved. Following contrast the major dural venous sinuses are enhancing and appear to be patent.   Skull and upper cervical spine: Negative for age visible cervical spine. Visualized bone marrow signal is within normal limits.   Sinuses/Orbits: Chronic postoperative changes to both globes. Some asymmetric atrophy of the right optic nerve suspected and compatible with reported ipsilateral blindness (series 18, image 23). Paranasal sinuses and mastoids are clear.   Other: Visible internal auditory structures appear normal. Negative visible scalp and face.   IMPRESSION: Normal for age MRI appearance of the Brain.

## 2024-05-18 NOTE — Progress Notes (Signed)
 Patient ID: Laura Maynard, female   DOB: November 08, 1945, 78 y.o.   MRN: [id number]  HPI  Laura Maynard is a 78 y.o.-year-old female, returning for f/u for postablative hypothyroidism after toxic multinodular goiter (TMNG) radioactive iodine treatment. Last visit 1 year ago.  Interim history: She continues to have knee pain. She had steroid injections - last was last month. She has HAs, whooshing sound in her ears.  She had an MRI yesterday.  She is still waiting for the results.  She was told they may take 2 weeks.  Reviewed history: Pt had screening labs in 01/2014 and was found to have a low TSH. This was repeated 2x and the TSH continued to decrease.   Thyroid  Uptake and scan (05/20/2014) >> uptake 48.5% and scan c/w TMNG.  We started MMI 5 mg bid.  Had RAI tx on 06/09/2014.   After RAI treatment, she developed post-ablative hypothyroidism >> we started levothyroxine  50 g daily. She developed leg cramps, which resolved after stopping levothyroxine  but her TSH started to increase, so we change to Tirosint . Unfortunately, she could not afford this and we had to restart levothyroxine  at the lower dose, subsequent to be increased back to 50 g daily (on 08/12/2016).  Her TSH became suppressed after increased the dose to 75 mcg daily.    She had itching with a 75 mcg tablet so we switched to only 50 mcg tablets, which are white.  She is tolerating these well.  She continues on levothyroxine  75 mcg (1.5 tablet of 50 mcg) daily: - in am - fasting - at least 30 min from b'fast - no calcium  - no iron - no multivitamins - no PPIs - not on Biotin  Reviewed her TFTs: Lab Results  Component Value Date   TSH 1.01 02/19/2024   TSH 1.62 01/27/2023   TSH 2.70 06/26/2022   TSH 0.85 11/21/2021   TSH 0.57 06/27/2021   TSH 5.02 05/18/2021   TSH 2.67 11/17/2020   TSH 4.22 07/20/2020   TSH 3.77 01/19/2020   TSH 3.41 10/26/2019   FREET4 0.82 06/26/2022   FREET4 0.90 11/21/2021   FREET4 0.95  06/27/2021   FREET4 0.89 05/18/2021   FREET4 0.88 11/17/2020   FREET4 0.96 10/26/2019   FREET4 0.89 04/22/2019   FREET4 0.95 10/29/2018   FREET4 0.95 10/14/2017   FREET4 1.14 08/28/2017    Pt denies: - feeling nodules in neck - hoarseness - dysphagia - choking  She also has a history of HTN, HL, anemia.  Vitamin D  deficiency:  Reviewed vitamin D  levels: Lab Results  Component Value Date   VD25OH 18.66 (L) 02/19/2024   VD25OH 15 (L) 07/30/2023   VD25OH 34 01/27/2023   VD25OH 29 (L) 01/23/2022   VD25OH 27 (L) 07/31/2021   VD25OH 26.0 (L) 11/17/2020   VD25OH 25.8 (L) 05/24/2020   VD25OH 22 (L) 01/19/2020   VD25OH 27.83 (L) 10/26/2019   VD25OH 24.26 (L) 04/22/2019   She had very low vitamin D  levels in the past; she was previously on 5000 units vitamin D  daily but was not compliant with this due to headaches and itching.  She was on 1000 units daily at last visit (reduced dose due to concerns for constipation), and I advised her to increase to 2000 units daily.  However at last visit she was still on 1000 units daily.  His vitamin D  was low I advised her to at least alternate 1000 with 2000 units every other day.  However, she continues  on 1000 units daily.She tells me that this is the maximum amount that she can take per day. PCP recommended ergocalciferol  after the last results returned.  She has knee pains and had steroid injections in her knees. Dr. Mona started a statin (Crestor  5) qod but she could not tolerate this due to knee pain. Then she tried Rapatha >> no change. Now Leqvio .  He has a history of osteopenia. She has androgenic alopecia.  ROS: + see HPI + Muscle aches/+ joint aches - knees  I reviewed pt's medications, allergies, PMH, social hx, family hx, and changes were documented in the history of present illness. Otherwise, unchanged from my initial visit note.  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   High cholesterol    Hypertension    Hypothyroidism    Migraine    Prediabetes    Sarcoidosis, lung (HCC) 15 YRS AGO   NO TREATMENTS   Toxic multinodular goiter 05/27/2014   Vertigo    Vitamin D  deficiency    Past Surgical History:  Procedure Laterality Date   BACK SURGERY  1970   CATARACT EXTRACTION Left 12/2017   COLONOSCOPY  2006; 03/28/11   2 small adenomas; normal   EYE SURGERY  1964   right eye   KNEE ARTHROSCOPY  2002   NASAL SEPTUM SURGERY  1998   TOTAL ABDOMINAL HYSTERECTOMY  2000   Social History   Socioeconomic History   Marital status: Divorced    Spouse name: Not on file   Number of children: 0   Years of education: college   Highest education level: Not on file  Occupational History    Comment: Retired  Tobacco Use   Smoking status: Never   Smokeless tobacco: Never  Vaping Use   Vaping status: Never Used  Substance and Sexual Activity   Alcohol use: Never    Alcohol/week: 1.0 standard drink of alcohol    Types: 1 Standard drinks or equivalent per week    Comment: RARE   Drug use: Never   Sexual activity: Not Currently  Other Topics Concern   Not on file  Social History Narrative   Patient is retired and lives at home alone. Patient  has college education.   Caffeine - 3 cups of caffeine  daily.  One soda daily.   Right handed.   Social Drivers of Corporate investment banker Strain: Not on file  Food Insecurity: Not on file  Transportation Needs: Not on file  Physical Activity: Not on file  Stress: Not on file  Social Connections: Not on file  Intimate Partner Violence: Not on file   Current Outpatient Medications on File Prior to Visit  Medication Sig Dispense Refill   Acetaminophen 325 MG CAPS Take by mouth.     AYR SALINE NASAL NA Place into the nose.     butalbital -acetaminophen-caffeine  (FIORICET) 50-325-40 MG tablet TAKE 1 TABLET  EVERY 4 HOURS AS NEEDED FOR SEVERE HEADACHE                                                                                /  TAKE                                                   BY                                        MOUTH 30 tablet 0   cefUROXime  (CEFTIN ) 500 MG tablet Take 1 tablet (500 mg total) by mouth 2 (two) times daily with a meal for 10 days. 20 tablet 0   cholecalciferol (VITAMIN D3) 25 MCG (1000 UNIT) tablet Take 1 tablet (1,000 Units total) by mouth 2 (two) times daily with a meal.     clobetasol  (TEMOVATE ) 0.05 % external solution Apply 1 Application topically 2 (two) times daily. use one or two times a day PRN to affected areas 50 mL 0   Cyanocobalamin  (VITAMIN B 12 PO) Take by mouth.     cycloSPORINE (RESTASIS) 0.05 % ophthalmic emulsion Apply to eye.     diclofenac  Sodium (VOLTAREN ) 1 % GEL Apply 4 g topically 4 (four) times daily as needed. 100 g 3   fluticasone  (FLONASE ) 50 MCG/ACT nasal spray SHAKE LIQUID AND USE 1 TO 2 SPRAYS IN EACH NOSTRIL TWICE DAILY AS NEEDED FOR ALLERGIES     inclisiran (LEQVIO ) 284 MG/1.5ML SOSY injection Inject 284mg  SQ at month 0 and month 3, then every 6 months 1.5 mL 2   levothyroxine  (SYNTHROID ) 50 MCG tablet Take 1.5 tablets (75 mcg total) by mouth daily before breakfast. 150 tablet 3   meclizine  (ANTIVERT ) 25 MG tablet Take      1 tablet      3 x day       if needed for Dizziness /Vertigo 100 tablet 0   metroNIDAZOLE  (METROGEL ) 1 % gel Apply topically daily. Apply  Daily  to Acne Rosacea rash 60 g 3   Naproxen Sodium (ALEVE PO) Take by mouth. PRN     NEOMYCIN -POLYMYXIN-HYDROCORTISONE  (CORTISPORIN) 1 % SOLN OTIC solution Apply  6 to 8 drops to affected ear 3 to 4 x /day 10 mL 3   predniSONE  (DELTASONE ) 20 MG tablet Take 2 tablets (40 mg total) by mouth daily with breakfast for 10 days. 20 tablet 0   Vitamin D , Ergocalciferol , (DRISDOL ) 1.25 MG (50000 UNIT) CAPS capsule Take 1 capsule (50,000 Units total) by mouth every 7 (seven) days. 12 capsule 3   No current facility-administered medications on  file prior to visit.   Allergies  Allergen Reactions   Penicillins Hives   Sulfa Antibiotics Hives   Zetia [Ezetimibe]     Myalgia   Doxycycline  Itching    itching   Gabapentin  Nausea And Vomiting   Pravastatin Other (See Comments)    Myalgia   Rosuvastatin  Other (See Comments)    Joint/muscle aches, fatigue   Family History  Problem Relation Age of Onset   Lung cancer Sister 38   Throat cancer Brother 80   Stroke Mother    Emphysema Father    COPD Sister    Diabetes Brother    Colon cancer Neg Hx    PE: BP 110/68 (BP Location: Left Arm, Patient Position: Sitting, Cuff Size: Normal)   Pulse 68   Ht 5' 6 (1.676 m)   Wt 161 lb (73 kg)  SpO2 95%   BMI 25.99 kg/m   Wt Readings from Last 15 Encounters:  05/18/24 161 lb (73 kg)  05/14/24 163 lb 4.8 oz (74.1 kg)  05/11/24 163 lb 3.2 oz (74 kg)  04/27/24 160 lb 12.8 oz (72.9 kg)  02/19/24 162 lb 12.8 oz (73.8 kg)  01/21/24 162 lb 12.8 oz (73.8 kg)  01/05/24 162 lb (73.5 kg)  12/18/23 164 lb 3.2 oz (74.5 kg)  12/11/23 162 lb (73.5 kg)  10/03/23 162 lb (73.5 kg)  09/19/23 164 lb 12.8 oz (74.8 kg)  09/19/23 164 lb 3.2 oz (74.5 kg)  07/30/23 160 lb 12.8 oz (72.9 kg)  07/22/23 160 lb (72.6 kg)  05/28/23 162 lb 9.6 oz (73.8 kg)   Constitutional: overweight, in NAD Eyes: no exophthalmos ENT: moist mucous membranes, no masses palpated in neck, no cervical lymphadenopathy Cardiovascular: RRR, No MRG Respiratory: CTA B Musculoskeletal: no deformities Skin: moist, warm, no rashes Neurological: no tremor with outstretched hands  ASSESSMENT: 1. Hypothyroidism after RAI tx   2. MNG  3. vitamin D  deficiency  PLAN:  1. And 2.  Patient with history of thyrotoxicosis due to toxic multinodular goiter, resolved after RAI treatment.  She developed post ablative hypothyroidism started levothyroxine .  She initially complained of severe cramps with levothyroxine  but she was later found to also have vitamin D  deficiency, which  is now treated.  We tried to add some Tirosint  in the past but this was too expensive.  She developed itching with the 75 mcg levothyroxine  tablets that she is now using 50 mcg white tablets, without coloring agents.  She tolerates these well. - no neck compression symptoms or masses felt on palpation of her neck today - latest thyroid  labs reviewed with pt. >> normal 3 months ago: Lab Results  Component Value Date   TSH 1.01 02/19/2024  - she continues on LT4 75 mcg daily - pt feels good on this dose.  However, she does have headaches, and also a whooshing sound in her ears.  She had an MRI of her brain yesterday.  She was told that it may be 2 weeks before she can receive the results.  I printed the results for her today-today appears normal. - we discussed about taking the thyroid  hormone every day, with water, >30 minutes before breakfast, separated by >4 hours from acid reflux medications, calcium , iron, multivitamins. Pt. is taking it correctly. - I refilled her levothyroxine  today - I will see her back in a year  3.  Vitamin D  deficiency  -She has a history of a very low vitamin D  level in the past and unfortunately she was not able to tolerate higher doses of vitamin D  supplement due to constipation - We have her on 1000 units vitamin D  daily and she mentions that this is the maximum that she could take.  She tries to alternate between 1002 1000 units every other day before but she could not tolerate this. - At last visit, vitamin D  level was normal, but she had another level on 02/19/2024 and this was quite low, at 18.66.  PCP recommended ergocalciferol .  - Further management per PCP.  Requested Prescriptions   Signed Prescriptions Disp Refills   levothyroxine  (SYNTHROID ) 50 MCG tablet 150 tablet 3    Sig: Take 1.5 tablets (75 mcg total) by mouth daily before breakfast.   Laura Fendt, MD PhD [facility]

## 2024-05-20 ENCOUNTER — Encounter: Payer: Self-pay | Admitting: Family Medicine

## 2024-05-20 ENCOUNTER — Ambulatory Visit: Admitting: Family Medicine

## 2024-05-20 VITALS — BP 128/76 | HR 62 | Temp 97.0°F | Resp 18 | Wt 162.0 lb

## 2024-05-20 DIAGNOSIS — R519 Headache, unspecified: Secondary | ICD-10-CM

## 2024-05-20 DIAGNOSIS — H9313 Tinnitus, bilateral: Secondary | ICD-10-CM

## 2024-05-20 DIAGNOSIS — K521 Toxic gastroenteritis and colitis: Secondary | ICD-10-CM | POA: Diagnosis not present

## 2024-05-20 DIAGNOSIS — H6693 Otitis media, unspecified, bilateral: Secondary | ICD-10-CM | POA: Diagnosis not present

## 2024-05-20 DIAGNOSIS — T3695XA Adverse effect of unspecified systemic antibiotic, initial encounter: Secondary | ICD-10-CM | POA: Diagnosis not present

## 2024-05-20 NOTE — Patient Instructions (Signed)
  VISIT SUMMARY: Today, we reviewed your ongoing symptoms and treatment plan for your headache, ear ringing, and diarrhea. Your headache has mostly resolved with the current treatment, but you still experience a 'hissing' noise in your ears. We discussed the potential causes and next steps for managing your symptoms.  YOUR PLAN: -HEADACHE SECONDARY TO VIRAL UPPER RESPIRATORY INFECTION AND ACUTE SINUSITIS: Your headache was likely caused by a viral infection and sinusitis. The initial treatment with azithromycin  did not help, but prednisone  and cefuroxime  have improved your symptoms. You should complete the current course of these medications. Your MRI and blood work were normal, ruling out more serious conditions.  -TINNITUS: Tinnitus is a ringing or noise in the ears that can be caused by infections or other conditions. Your symptoms are more noticeable at night and with water exposure. We discussed the option of seeing an ENT specialist for further evaluation and treatment, but you prefer to wait and see if the symptoms improve on their own.  -DIARRHEA SECONDARY TO ANTIBIOTICS: Your diarrhea is likely a side effect of the antibiotics. It is tolerable but more frequent than usual. We discussed managing it with dietary changes, such as drinking more water and eating fiber-rich foods like bananas and oats.  INSTRUCTIONS: Please complete your current course of prednisone  and cefuroxime . We will provide you with a printed copy of your MRI and blood work results. If your ear ringing does not improve, consider scheduling an appointment with ENT specialist Dr. Karis for further evaluation. Stay hydrated and follow the dietary recommendations to manage your diarrhea.

## 2024-05-20 NOTE — Progress Notes (Signed)
 Assessment & Plan   Assessment/Plan:     Assessment & Plan Headache secondary to viral upper respiratory infection and acute sinusitis Headache initially treated with azithromycin  without improvement, followed by oral prednisone  and cefuroxime . Initial concern for GCA, intracranial hemorrhage, or mass due to vision changes, but workup including CBC, ESR, CRP, and MRI was negative. MRI showed normal age-related brain appearance and clear sinuses. Symptoms have improved with current treatment, indicating likely resolution of superimposed sinus and ear infection. No signs of stroke, cancer, or other serious conditions. - Complete current course of prednisone  and cefuroxime . - Provide printed copy of MRI and blood work results.  Tinnitus Persistent tinnitus, described as a high-pitched noise, possibly related to recent viral infection. Symptoms are more noticeable at night and when water enters the ears. Discussed potential for ENT referral and treatment options to manage symptoms, including medications to help tune out the noise. She prefers to wait and see if symptoms improve before pursuing further treatment. - Refer to ENT specialist Dr. Tio for further evaluation and management of tinnitus.  Diarrhea secondary to antibiotics Diarrhea noted as more frequent than usual but tolerable. Discussed dietary modifications to manage symptoms, including increased hydration and consumption of fiber-rich foods like bananas and oats. - Encourage hydration and dietary modifications to manage diarrhea.      There are no discontinued medications.  Return if symptoms worsen or fail to improve.        Subjective:   Encounter date: 05/20/2024  Laura Maynard is a 78 y.o. female who has History of prediabetes; Vitamin D  deficiency; Hyperlipidemia LDL goal <70; Poor compliance with medication; Benign paroxysmal positional vertigo; TMJ (temporomandibular joint syndrome); Postablative hypothyroidism;  BMI 27.0-27.9,adult; Medication Phobia; Multinodular goiter; FH: hypertension; Abnormal glucose; Statin myopathy; Migraine without aura and without status migrainosus, not intractable; Insomnia; Right sided sciatica; Sialectasis; Seasonal allergic conjunctivitis; Pseudophakia of left eye; Dry eye syndrome, bilateral; Cataract, nuclear sclerotic, left eye; Cataract cortical, senile, left; Blood in urine; Aphakia, right eye; Osteoarthritis of knees, bilateral; Change in bowel habits; Seborrheic dermatitis of scalp; Alopecia; Blindness, one eye, low vision other eye, unspecified eyes; Gastrointestinal complaint; B12 deficiency; Vertigo; Episodic tension-type headache, not intractable; and Arthralgia of left knee on their problem list..   She  has a past medical history of Blind right eye, Chorioretinal scar, macular (03/10/2013), Essential hypertension (02/19/2024), High cholesterol, Hypertension, Hypothyroidism, Labile hypertension, Migraine, Prediabetes, Sarcoidosis, lung (HCC) (15 YRS AGO), Toxic multinodular goiter (05/27/2014), Vertigo, and Vitamin D  deficiency..   She presents with chief complaint of URI (1 week follow up URI and MRI results. Pt stated symptoms have improved but is still experiencing ear discomfort. //HM due- shingles vaccine and mammogram ) .   Discussed the use of AI scribe software for clinical note transcription with the patient, who gave verbal consent to proceed.  History of Present Illness Laura Maynard is a 78 year old female who presents with ongoing assessment of acute headache likely secondary to viral URI versus sinusitis.  She was initially treated with azithromycin  without improvement. Subsequently, she was treated with oral prednisone  and cefuroxime . The headache has mostly resolved, but she still experiences a 'hissing' noise in her ears, which is more pronounced at night and seems to worsen with water exposure. She has not completed her course of antibiotics and  steroids, with two days remaining. She experienced diarrhea, which she describes as tolerable, and has not used probiotics.  Previously, she endured vision changes and vomiting, which were concerning for  more serious conditions. She underwent a comprehensive workup including CBC, ESR, CRP, and MRI of the brain. The MRI was reported as normal for her age.  She has a history of hypertension, which is well-controlled without medication. She also has a history of glaucoma and dry eyes, with her vision returning to baseline after the acute episode. She reports chronic issues with her right eye and dry eye in the left, which has been managed by her eye doctor.  During the review of symptoms, no current headaches but persistent ear ringing. The ringing began with the onset of her other symptoms, including dizziness and vomiting, which have since resolved. The ear ringing is annoying but not significantly impacting her sleep.     ROS  Past Surgical History:  Procedure Laterality Date   BACK SURGERY  1970   CATARACT EXTRACTION Left 12/2017   COLONOSCOPY  2006; 03/28/11   2 small adenomas; normal   EYE SURGERY  1964   right eye   KNEE ARTHROSCOPY  2002   NASAL SEPTUM SURGERY  1998   TOTAL ABDOMINAL HYSTERECTOMY  2000    Outpatient Medications Prior to Visit  Medication Sig Dispense Refill   Acetaminophen 325 MG CAPS Take by mouth.     AYR SALINE NASAL NA Place into the nose.     butalbital -acetaminophen-caffeine  (FIORICET) 50-325-40 MG tablet TAKE 1 TABLET  EVERY 4 HOURS AS NEEDED FOR SEVERE HEADACHE                                                                               /                                      TAKE                                                   BY                                        MOUTH 30 tablet 0   cefUROXime  (CEFTIN ) 500 MG tablet Take 1 tablet (500 mg total) by mouth 2 (two) times daily with a meal for 10 days. 20 tablet 0   cholecalciferol (VITAMIN D3) 25 MCG  (1000 UNIT) tablet Take 1 tablet (1,000 Units total) by mouth 2 (two) times daily with a meal.     clobetasol  (TEMOVATE ) 0.05 % external solution Apply 1 Application topically 2 (two) times daily. use one or two times a day PRN to affected areas 50 mL 0   Cyanocobalamin  (VITAMIN B 12 PO) Take by mouth.     cycloSPORINE (RESTASIS) 0.05 % ophthalmic emulsion Apply to eye.     diclofenac  Sodium (VOLTAREN ) 1 % GEL Apply 4 g topically 4 (four) times daily as needed. 100 g 3   fluticasone  (FLONASE ) 50 MCG/ACT nasal spray SHAKE LIQUID AND USE 1  TO 2 SPRAYS IN EACH NOSTRIL TWICE DAILY AS NEEDED FOR ALLERGIES     inclisiran (LEQVIO ) 284 MG/1.5ML SOSY injection Inject 284mg  SQ at month 0 and month 3, then every 6 months 1.5 mL 2   levothyroxine  (SYNTHROID ) 50 MCG tablet Take 1.5 tablets (75 mcg total) by mouth daily before breakfast. 150 tablet 3   meclizine  (ANTIVERT ) 25 MG tablet Take      1 tablet      3 x day       if needed for Dizziness /Vertigo 100 tablet 0   metroNIDAZOLE  (METROGEL ) 1 % gel Apply topically daily. Apply  Daily  to Acne Rosacea rash 60 g 3   Naproxen Sodium (ALEVE PO) Take by mouth. PRN     NEOMYCIN -POLYMYXIN-HYDROCORTISONE  (CORTISPORIN) 1 % SOLN OTIC solution Apply  6 to 8 drops to affected ear 3 to 4 x /day 10 mL 3   predniSONE  (DELTASONE ) 20 MG tablet Take 2 tablets (40 mg total) by mouth daily with breakfast for 10 days. 20 tablet 0   Vitamin D , Ergocalciferol , (DRISDOL ) 1.25 MG (50000 UNIT) CAPS capsule Take 1 capsule (50,000 Units total) by mouth every 7 (seven) days. 12 capsule 3   No facility-administered medications prior to visit.    Family History  Problem Relation Age of Onset   Lung cancer Sister 23   Throat cancer Brother 24   Stroke Mother    Emphysema Father    COPD Sister    Diabetes Brother    Colon cancer Neg Hx     Social History   Socioeconomic History   Marital status: Divorced    Spouse name: Not on file   Number of children: 0   Years of  education: college   Highest education level: Not on file  Occupational History    Comment: Retired  Tobacco Use   Smoking status: Never   Smokeless tobacco: Never  Vaping Use   Vaping status: Never Used  Substance and Sexual Activity   Alcohol use: Never    Alcohol/week: 1.0 standard drink of alcohol    Types: 1 Standard drinks or equivalent per week    Comment: RARE   Drug use: Never   Sexual activity: Not Currently  Other Topics Concern   Not on file  Social History Narrative   Patient is retired and lives at home alone. Patient  has college education.   Caffeine - 3 cups of caffeine  daily.  One soda daily.   Right handed.   Social Drivers of Corporate investment banker Strain: Not on file  Food Insecurity: Not on file  Transportation Needs: Not on file  Physical Activity: Not on file  Stress: Not on file  Social Connections: Not on file  Intimate Partner Violence: Not on file                                                                                                  Objective:  Physical Exam: BP 128/76 (BP Location: Left Arm, Patient Position: Sitting, Cuff Size: Normal)   Pulse 62   Temp (!) 97 F (  36.1 C) (Temporal)   Resp 18   Wt 162 lb (73.5 kg)   SpO2 100%   BMI 26.15 kg/m    Physical Exam GENERAL: Alert, cooperative, well developed, no acute distress. HEENT: Normocephalic, normal oropharynx, moist mucous membranes. TM bilaterally normal  CHEST: Clear to auscultation bilaterally, no wheezes, rhonchi, or crackles. CARDIOVASCULAR: Normal heart rate and rhythm, S1 and S2 normal without murmurs. ABDOMEN: Soft, non-tender, non-distended, without organomegaly, normal bowel sounds. EXTREMITIES: No cyanosis or edema. NEUROLOGICAL: Cranial nerves grossly intact, moves all extremities without gross motor or sensory deficit.   Physical Exam  MR BRAIN W WO CONTRAST Result Date: 05/17/2024 CLINICAL DATA:  78 year old female with new onset headache,  intractable headache. History of goiter, right eye blindness. EXAM: MRI HEAD WITHOUT AND WITH CONTRAST TECHNIQUE: Multiplanar, multiecho pulse sequences of the brain and surrounding structures were obtained without and with intravenous contrast. CONTRAST:  7mL GADAVIST  GADOBUTROL  1 MMOL/ML IV SOLN COMPARISON:  Head CT 11/07/2015. FINDINGS: Brain: Normal cerebral volume. No restricted diffusion to suggest acute infarction. No midline shift, mass effect, evidence of mass lesion, ventriculomegaly, extra-axial collection or acute intracranial hemorrhage. Cervicomedullary junction and pituitary are within normal limits. Elnor and white matter signal is within normal limits for age throughout the brain. No chronic cerebral blood products. No cortical encephalomalacia. No abnormal enhancement identified. No dural thickening. Vascular: Major intracranial vascular flow voids are preserved. Following contrast the major dural venous sinuses are enhancing and appear to be patent. Skull and upper cervical spine: Negative for age visible cervical spine. Visualized bone marrow signal is within normal limits. Sinuses/Orbits: Chronic postoperative changes to both globes. Some asymmetric atrophy of the right optic nerve suspected and compatible with reported ipsilateral blindness (series 18, image 23). Paranasal sinuses and mastoids are clear. Other: Visible internal auditory structures appear normal. Negative visible scalp and face. IMPRESSION: Normal for age MRI appearance of the Brain. Electronically Signed   By: VEAR Hurst M.D.   On: 05/17/2024 09:56    Recent Results (from the past 2160 hours)  POCT Influenza A/B     Status: Normal   Collection Time: 05/11/24  4:52 PM  Result Value Ref Range   Influenza A, POC Negative Negative   Influenza B, POC Negative Negative  POC COVID-19 BinaxNow     Status: Normal   Collection Time: 05/11/24  4:53 PM  Result Value Ref Range   SARS Coronavirus 2 Ag Negative Negative  CBC w/Diff      Status: None   Collection Time: 05/11/24  5:06 PM  Result Value Ref Range   WBC 5.9 3.8 - 10.8 Thousand/uL   RBC 4.37 3.80 - 5.10 Million/uL   Hemoglobin 12.0 11.7 - 15.5 g/dL   HCT 62.4 64.9 - 54.9 %   MCV 85.8 80.0 - 100.0 fL   MCH 27.5 27.0 - 33.0 pg   MCHC 32.0 32.0 - 36.0 g/dL    Comment: For adults, a slight decrease in the calculated MCHC value (in the range of 30 to 32 g/dL) is most likely not clinically significant; however, it should be interpreted with caution in correlation with other red cell parameters and the patient's clinical condition.    RDW 13.3 11.0 - 15.0 %   Platelets 389 140 - 400 Thousand/uL   MPV 9.3 7.5 - 12.5 fL   Neutro Abs 3,257 1,500 - 7,800 cells/uL   Absolute Lymphocytes 1,918 850 - 3,900 cells/uL   Absolute Monocytes 608 200 - 950 cells/uL   Eosinophils Absolute  71 15 - 500 cells/uL   Basophils Absolute 47 0 - 200 cells/uL   Neutrophils Relative % 55.2 %   Total Lymphocyte 32.5 %   Monocytes Relative 10.3 %   Eosinophils Relative 1.2 %   Basophils Relative 0.8 %  CRP High sensitivity     Status: None   Collection Time: 05/11/24  5:06 PM  Result Value Ref Range   hs-CRP 0.6 mg/L    Comment: Reference Range Optimal <1.0 Jellinger PS et al. Gaylene Pract.2017;23(Suppl 2):1-87. For ages >37 Years: hs-CRP mg/L  Risk According to AHA/CDC Guidelines <1.0         Lower relative cardiovascular risk. 1.0-3.0      Average relative cardiovascular risk. 3.1-10.0     Higher relative cardiovascular risk.              Consider retesting in 1 to 2 weeks to              exclude a benign transient elevation              in the baseline CRP value secondary              to infection or inflammation. >10.0        Persistent elevation, upon retesting,              may be associated with infection and              inflammation. SABRA Sierra TA, Mensah GA, Alexander RW, et al. Markers of inflammation and cardiovascular disease:   application to clinical and  public health practice: A statement for healthcare professionals from the  Centers for Disease Control and Prevention and the  American Heart Association. Circulation 2003; 107(3): E9623681.   Sedimentation rate     Status: None   Collection Time: 05/11/24  5:06 PM  Result Value Ref Range   Sed Rate 17 0 - 30 mm/h        Beverley Adine Hummer, MD, MS

## 2024-05-27 ENCOUNTER — Telehealth: Payer: Self-pay

## 2024-05-27 NOTE — Telephone Encounter (Signed)
 Spoke with ophthalmology (ENT) rep at Dr. Daniel Moccasin office regarding referral and appointment; hard copy of referral was faxed on 05/27/2024 @ 12:40pm with confirmation as requested for review and scheduling.

## 2024-05-27 NOTE — Telephone Encounter (Signed)
 Copied from CRM [id number]. Topic: General - Other >> May 27, 2024  9:26 AM Roselie BROCKS wrote: Reason for CRM: Patient wanted the clinic to know she is  not able to get through to  Karis Clunes, MD ENT to schedule. She has called several times , wanting to know if anything can be done to schedule.

## 2024-05-29 DIAGNOSIS — H9203 Otalgia, bilateral: Secondary | ICD-10-CM | POA: Diagnosis not present

## 2024-05-29 DIAGNOSIS — R110 Nausea: Secondary | ICD-10-CM | POA: Diagnosis not present

## 2024-05-29 DIAGNOSIS — G43901 Migraine, unspecified, not intractable, with status migrainosus: Secondary | ICD-10-CM | POA: Diagnosis not present

## 2024-05-29 DIAGNOSIS — H9313 Tinnitus, bilateral: Secondary | ICD-10-CM | POA: Diagnosis not present

## 2024-06-02 DIAGNOSIS — H0288B Meibomian gland dysfunction left eye, upper and lower eyelids: Secondary | ICD-10-CM | POA: Diagnosis not present

## 2024-06-02 DIAGNOSIS — H53132 Sudden visual loss, left eye: Secondary | ICD-10-CM | POA: Diagnosis not present

## 2024-06-02 DIAGNOSIS — H04122 Dry eye syndrome of left lacrimal gland: Secondary | ICD-10-CM | POA: Diagnosis not present

## 2024-06-02 DIAGNOSIS — H04123 Dry eye syndrome of bilateral lacrimal glands: Secondary | ICD-10-CM | POA: Diagnosis not present

## 2024-06-02 DIAGNOSIS — H53122 Transient visual loss, left eye: Secondary | ICD-10-CM | POA: Diagnosis not present

## 2024-06-02 DIAGNOSIS — H04121 Dry eye syndrome of right lacrimal gland: Secondary | ICD-10-CM | POA: Diagnosis not present

## 2024-06-02 DIAGNOSIS — H5319 Other subjective visual disturbances: Secondary | ICD-10-CM | POA: Diagnosis not present

## 2024-06-02 DIAGNOSIS — H0288A Meibomian gland dysfunction right eye, upper and lower eyelids: Secondary | ICD-10-CM | POA: Diagnosis not present

## 2024-06-17 ENCOUNTER — Ambulatory Visit: Admitting: Family Medicine

## 2024-06-17 ENCOUNTER — Other Ambulatory Visit: Payer: Self-pay

## 2024-06-17 VITALS — BP 139/87 | HR 102 | Temp 97.8°F | Resp 20 | Wt 163.6 lb

## 2024-06-17 DIAGNOSIS — H9313 Tinnitus, bilateral: Secondary | ICD-10-CM | POA: Diagnosis not present

## 2024-06-17 DIAGNOSIS — G43009 Migraine without aura, not intractable, without status migrainosus: Secondary | ICD-10-CM | POA: Diagnosis not present

## 2024-06-17 MED ORDER — SUMATRIPTAN SUCCINATE 100 MG PO TABS: ORAL_TABLET | ORAL | 1 refills | Status: DC

## 2024-06-17 MED ORDER — SUMATRIPTAN SUCCINATE 100 MG PO TABS: ORAL_TABLET | ORAL | 1 refills | Status: AC

## 2024-06-17 MED ORDER — KETOROLAC TROMETHAMINE 30 MG/ML IJ SOLN
30.0000 mg | Freq: Once | INTRAMUSCULAR | Status: AC
  Administered 2024-06-17: 30 mg via INTRAMUSCULAR

## 2024-06-17 MED ORDER — SUMATRIPTAN SUCCINATE 25 MG PO TABS: 25.0000 mg | ORAL_TABLET | ORAL | 2 refills | Status: AC | PRN

## 2024-06-17 NOTE — Progress Notes (Unsigned)
 Assessment & Plan   Assessment/Plan:    Problem List Items Addressed This Visit   None       Assessment and Plan Assessment & Plan Chronic headaches with recurrent migraine and tinnitus Chronic headaches with recurrent migraine and associated tinnitus, severe enough to disrupt sleep. MRI and blood work negative for giant cell arteritis, cancer, and stroke. Previous treatments included sumatriptan  with some relief and Fioricet, which may have exacerbated headaches due to caffeine . Concerns about medication overuse and side effects discussed. Differential includes sinus issues, but MRI and previous treatments do not support this. ENT referral pending for further evaluation of tinnitus and potential vertigo. - Administer Toradol  injection for acute headache relief. - Refill sumatriptan  for migraine management. - Discuss potential use of amitriptyline  for headache prevention and tinnitus management, but she prefers to wait for ENT evaluation. - Advise to avoid Fioricet due to potential for rebound headaches. - Coordinate with ENT for further evaluation and management of tinnitus and headaches.  Bilateral otitis externa, resolved Bilateral otitis externa previously treated with ciprofloxacin  and dexamethasone  ear drops. Completed a full ten-day course of treatment. Symptoms have resolved, but she reports ongoing tinnitus and headaches. - No further treatment required for otitis externa as it has resolved.      There are no discontinued medications.  No follow-ups on file.        Subjective:   Encounter date: 06/17/2024  Laura Maynard is a 78 y.o. female who has History of prediabetes; Vitamin D  deficiency; Hyperlipidemia LDL goal <70; Poor compliance with medication; Benign paroxysmal positional vertigo; TMJ (temporomandibular joint syndrome); Postablative hypothyroidism; BMI 27.0-27.9,adult; Medication Phobia; Multinodular goiter; FH: hypertension; Abnormal glucose; Statin  myopathy; Migraine without aura and without status migrainosus, not intractable; Insomnia; Right sided sciatica; Sialectasis; Seasonal allergic conjunctivitis; Pseudophakia of left eye; Dry eye syndrome, bilateral; Cataract, nuclear sclerotic, left eye; Cataract cortical, senile, left; Blood in urine; Aphakia, right eye; Osteoarthritis of knees, bilateral; Change in bowel habits; Seborrheic dermatitis of scalp; Alopecia; Blindness, one eye, low vision other eye, unspecified eyes; Gastrointestinal complaint; B12 deficiency; Vertigo; Episodic tension-type headache, not intractable; and Arthralgia of left knee on their problem list..   She  has a past medical history of Blind right eye, Chorioretinal scar, macular (03/10/2013), Essential hypertension (02/19/2024), High cholesterol, Hypertension, Hypothyroidism, Labile hypertension, Migraine, Prediabetes, Sarcoidosis, lung (HCC) (15 YRS AGO), Toxic multinodular goiter (05/27/2014), Vertigo, and Vitamin D  deficiency..   She presents with chief complaint of Headache (Pt stated headaches and tinnitus are still present. Pt went to urgent care on 05/29/2024 and was prescribed SUMAtriptan  (IMITREX ) 25 mg tablet and ciprofloxacin -dexAMETHasone  (CIPRODEX) 0.3-0.1 %. ENT OV scheduled for 06/29/2024) .   Discussed the use of AI scribe software for clinical note transcription with the patient, who gave verbal consent to proceed.  History of Present Illness Laura Maynard is a 78 year old female who presents with ongoing headaches.  Cephalalgia (headache) - Persistent, dull headache ongoing since at least May 29, 2024 - Headaches occur daily and are severe enough to wake her from sleep, most recently at 3 AM - Headaches initially treated at urgent care with sumatriptan , methylprednisolone  injection, and Toradol  injection - Previous use of Fioricet, which was suspected to exacerbate headaches - Sumatriptan  provides partial relief but is used sparingly due to  limited supply and concern about running out before next appointment - Hydrocodone prescribed but not taken due to concerns about potency and side effects - Phenergan  previously used for pain management - No  current dizziness or nausea, which were present during urgent care visit - Headaches do not prevent her from falling asleep initially, but do wake her up during the night  Tinnitus and otologic symptoms - Persistent loud ringing or 'hissing' sound in both ears - Ear symptoms and headaches are temporally linked, described as 'going hand in hand' - Completed a full ten-day course of ciprofloxacin  and dexamethasone  ear drops with no resolution of symptoms - No current dizziness  Medication use and concerns - Prescribed sumatriptan , hydrocodone, and an anxiety medication - Hesitant to use medications extensively due to concerns about side effects and limited refills - Has not taken hydrocodone due to fear of potency and potential side effects - Sumatriptan  used sparingly due to limited supply  Prior diagnostic evaluation - MRI and blood work performed, with no concerning findings for giant cell arteritis, cancer, or stroke - Previously treated with steroids and cefdinir  Specialist referral and access to care - ENT appointment scheduled for June 29, 2024 - Expresses frustration and confusion regarding delay in obtaining ENT evaluation     ROS  Past Surgical History:  Procedure Laterality Date   BACK SURGERY  1970   CATARACT EXTRACTION Left 12/2017   COLONOSCOPY  2006; 03/28/11   2 small adenomas; normal   EYE SURGERY  1964   right eye   KNEE ARTHROSCOPY  2002   NASAL SEPTUM SURGERY  1998   TOTAL ABDOMINAL HYSTERECTOMY  2000    Outpatient Medications Prior to Visit  Medication Sig Dispense Refill   Acetaminophen 325 MG CAPS Take by mouth.     AYR SALINE NASAL NA Place into the nose.     butalbital -acetaminophen-caffeine  (FIORICET) 50-325-40 MG tablet TAKE 1 TABLET   EVERY 4 HOURS AS NEEDED FOR SEVERE HEADACHE                                                                               /                                      TAKE                                                   BY                                        MOUTH 30 tablet 0   cholecalciferol (VITAMIN D3) 25 MCG (1000 UNIT) tablet Take 1 tablet (1,000 Units total) by mouth 2 (two) times daily with a meal.     clobetasol  (TEMOVATE ) 0.05 % external solution Apply 1 Application topically 2 (two) times daily. use one or two times a day PRN to affected areas 50 mL 0   Cyanocobalamin  (VITAMIN B 12 PO) Take by mouth.     cycloSPORINE (RESTASIS) 0.05 % ophthalmic emulsion Apply to eye.  diclofenac  Sodium (VOLTAREN ) 1 % GEL Apply 4 g topically 4 (four) times daily as needed. 100 g 3   fluticasone  (FLONASE ) 50 MCG/ACT nasal spray SHAKE LIQUID AND USE 1 TO 2 SPRAYS IN EACH NOSTRIL TWICE DAILY AS NEEDED FOR ALLERGIES     inclisiran (LEQVIO ) 284 MG/1.5ML SOSY injection Inject 284mg  SQ at month 0 and month 3, then every 6 months 1.5 mL 2   levothyroxine  (SYNTHROID ) 50 MCG tablet Take 1.5 tablets (75 mcg total) by mouth daily before breakfast. 150 tablet 3   meclizine  (ANTIVERT ) 25 MG tablet Take      1 tablet      3 x day       if needed for Dizziness /Vertigo 100 tablet 0   metroNIDAZOLE  (METROGEL ) 1 % gel Apply topically daily. Apply  Daily  to Acne Rosacea rash 60 g 3   Naproxen Sodium (ALEVE PO) Take by mouth. PRN     NEOMYCIN -POLYMYXIN-HYDROCORTISONE  (CORTISPORIN) 1 % SOLN OTIC solution Apply  6 to 8 drops to affected ear 3 to 4 x /day 10 mL 3   Vitamin D , Ergocalciferol , (DRISDOL ) 1.25 MG (50000 UNIT) CAPS capsule Take 1 capsule (50,000 Units total) by mouth every 7 (seven) days. 12 capsule 3   No facility-administered medications prior to visit.    Family History  Problem Relation Age of Onset   Lung cancer Sister 94   Throat cancer Brother 44   Stroke Mother    Emphysema Father    COPD Sister     Diabetes Brother    Colon cancer Neg Hx     Social History   Socioeconomic History   Marital status: Divorced    Spouse name: Not on file   Number of children: 0   Years of education: college   Highest education level: Not on file  Occupational History    Comment: Retired  Tobacco Use   Smoking status: Never   Smokeless tobacco: Never  Vaping Use   Vaping status: Never Used  Substance and Sexual Activity   Alcohol use: Never    Alcohol/week: 1.0 standard drink of alcohol    Types: 1 Standard drinks or equivalent per week    Comment: RARE   Drug use: Never   Sexual activity: Not Currently  Other Topics Concern   Not on file  Social History Narrative   Patient is retired and lives at home alone. Patient  has college education.   Caffeine - 3 cups of caffeine  daily.  One soda daily.   Right handed.   Social Drivers of Corporate investment banker Strain: Not on file  Food Insecurity: Not on file  Transportation Needs: Not on file  Physical Activity: Not on file  Stress: Not on file  Social Connections: Not on file  Intimate Partner Violence: Not on file                                                                                                  Objective:  Physical Exam: There were no vitals taken for this visit.   Physical Exam GENERAL: Alert,  cooperative, well developed, no acute distress. HEENT: Normocephalic, normal oropharynx, moist mucous membranes, ears examined with no abnormalities detected. CHEST: Clear to auscultation bilaterally, no wheezes, rhonchi, or crackles. CARDIOVASCULAR: Normal heart rate and rhythm, S1 and S2 normal without murmurs. ABDOMEN: Soft, non-tender, non-distended, without organomegaly, normal bowel sounds. EXTREMITIES: No cyanosis or edema. NEUROLOGICAL: Cranial nerves grossly intact, moves all extremities without gross motor or sensory deficit.   Physical Exam  MR BRAIN W WO CONTRAST Result Date: 05/17/2024 CLINICAL  DATA:  78 year old female with new onset headache, intractable headache. History of goiter, right eye blindness. EXAM: MRI HEAD WITHOUT AND WITH CONTRAST TECHNIQUE: Multiplanar, multiecho pulse sequences of the brain and surrounding structures were obtained without and with intravenous contrast. CONTRAST:  7mL GADAVIST  GADOBUTROL  1 MMOL/ML IV SOLN COMPARISON:  Head CT 11/07/2015. FINDINGS: Brain: Normal cerebral volume. No restricted diffusion to suggest acute infarction. No midline shift, mass effect, evidence of mass lesion, ventriculomegaly, extra-axial collection or acute intracranial hemorrhage. Cervicomedullary junction and pituitary are within normal limits. Elnor and white matter signal is within normal limits for age throughout the brain. No chronic cerebral blood products. No cortical encephalomalacia. No abnormal enhancement identified. No dural thickening. Vascular: Major intracranial vascular flow voids are preserved. Following contrast the major dural venous sinuses are enhancing and appear to be patent. Skull and upper cervical spine: Negative for age visible cervical spine. Visualized bone marrow signal is within normal limits. Sinuses/Orbits: Chronic postoperative changes to both globes. Some asymmetric atrophy of the right optic nerve suspected and compatible with reported ipsilateral blindness (series 18, image 23). Paranasal sinuses and mastoids are clear. Other: Visible internal auditory structures appear normal. Negative visible scalp and face. IMPRESSION: Normal for age MRI appearance of the Brain. Electronically Signed   By: VEAR Hurst M.D.   On: 05/17/2024 09:56    Recent Results (from the past 2160 hours)  POCT Influenza A/B     Status: Normal   Collection Time: 05/11/24  4:52 PM  Result Value Ref Range   Influenza A, POC Negative Negative   Influenza B, POC Negative Negative  POC COVID-19 BinaxNow     Status: Normal   Collection Time: 05/11/24  4:53 PM  Result Value Ref Range    SARS Coronavirus 2 Ag Negative Negative  CBC w/Diff     Status: None   Collection Time: 05/11/24  5:06 PM  Result Value Ref Range   WBC 5.9 3.8 - 10.8 Thousand/uL   RBC 4.37 3.80 - 5.10 Million/uL   Hemoglobin 12.0 11.7 - 15.5 g/dL   HCT 62.4 64.9 - 54.9 %   MCV 85.8 80.0 - 100.0 fL   MCH 27.5 27.0 - 33.0 pg   MCHC 32.0 32.0 - 36.0 g/dL    Comment: For adults, a slight decrease in the calculated MCHC value (in the range of 30 to 32 g/dL) is most likely not clinically significant; however, it should be interpreted with caution in correlation with other red cell parameters and the patient's clinical condition.    RDW 13.3 11.0 - 15.0 %   Platelets 389 140 - 400 Thousand/uL   MPV 9.3 7.5 - 12.5 fL   Neutro Abs 3,257 1,500 - 7,800 cells/uL   Absolute Lymphocytes 1,918 850 - 3,900 cells/uL   Absolute Monocytes 608 200 - 950 cells/uL   Eosinophils Absolute 71 15 - 500 cells/uL   Basophils Absolute 47 0 - 200 cells/uL   Neutrophils Relative % 55.2 %   Total Lymphocyte 32.5 %  Monocytes Relative 10.3 %   Eosinophils Relative 1.2 %   Basophils Relative 0.8 %  CRP High sensitivity     Status: None   Collection Time: 05/11/24  5:06 PM  Result Value Ref Range   hs-CRP 0.6 mg/L    Comment: Reference Range Optimal <1.0 Jellinger PS et al. Gaylene Pract.2017;23(Suppl 2):1-87. For ages >99 Years: hs-CRP mg/L  Risk According to AHA/CDC Guidelines <1.0         Lower relative cardiovascular risk. 1.0-3.0      Average relative cardiovascular risk. 3.1-10.0     Higher relative cardiovascular risk.              Consider retesting in 1 to 2 weeks to              exclude a benign transient elevation              in the baseline CRP value secondary              to infection or inflammation. >10.0        Persistent elevation, upon retesting,              may be associated with infection and              inflammation. SABRA Sierra TA, Mensah GA, Alexander RW, et al. Markers of inflammation and  cardiovascular disease:   application to clinical and public health practice: A statement for healthcare professionals from the  Centers for Disease Control and Prevention and the  American Heart Association. Circulation 2003; 107(3): E9623681.   Sedimentation rate     Status: None   Collection Time: 05/11/24  5:06 PM  Result Value Ref Range   Sed Rate 17 0 - 30 mm/h        Beverley Adine Hummer, MD, MS

## 2024-06-18 ENCOUNTER — Telehealth: Payer: Self-pay

## 2024-06-18 ENCOUNTER — Other Ambulatory Visit (HOSPITAL_COMMUNITY): Payer: Self-pay

## 2024-06-18 ENCOUNTER — Encounter: Payer: Self-pay | Admitting: Internal Medicine

## 2024-06-18 NOTE — Telephone Encounter (Signed)
 Copied from CRM [id number]. Topic: Clinical - Medication Question >> Jun 18, 2024 10:55 AM Thersia BROCKS wrote: Reason for CRM: Patient called in regarding prescription  SUMAtriptan  (IMITREX ) 25 MG tablet SUMAtriptan  (IMITREX ) 100 MG tablet   Stated the pharmacy stated they have sent some questions for Dr.Thompson to answer so patient could get the prescription states she needs it done as soon as possible because she is completely out .SABRA

## 2024-06-18 NOTE — Telephone Encounter (Signed)
 I have received a request via Rx request [facility] for Rx SUMAtriptan  (IMITREX ) 100 MG tablet . However the p/a could not be initiated until further questions were answered by the office due to the pt initiating the P/A. The Prior Auth has now been submitted verbally and will return back with a determination via fax in 24 hrs..Status is currently pending  Contact: [phone] Case# [id number]

## 2024-06-18 NOTE — Telephone Encounter (Signed)
 Can we start/submit prior authorization for ( SUMAtriptan  (IMITREX ) 100 MG tablet )

## 2024-06-21 ENCOUNTER — Other Ambulatory Visit (HOSPITAL_COMMUNITY): Payer: Self-pay

## 2024-06-21 ENCOUNTER — Encounter: Payer: Self-pay | Admitting: Internal Medicine

## 2024-06-21 NOTE — Telephone Encounter (Signed)
 Pharmacy Patient Advocate Encounter   Received notification from [employer] that Prior Authorization for Sumatriptan  has been APPROVED  to 12.31.25. Ran test claim, Copay is $1.65 per pts pref'd pharmacy the rx is filled and ready for pick up. This test claim was processed through [facility]- copay amounts may vary at other pharmacies due to pharmacy/plan contracts, or as the patient moves through the different stages of their insurance plan.

## 2024-06-22 ENCOUNTER — Other Ambulatory Visit (HOSPITAL_COMMUNITY): Payer: Self-pay

## 2024-06-22 NOTE — Telephone Encounter (Signed)
 Spoke to patient and Rx was picked up on 06/01/2024. Pt voiced a thank you for follow up call.

## 2024-06-22 NOTE — Telephone Encounter (Signed)
 Noted

## 2024-06-23 NOTE — Telephone Encounter (Signed)
 Rx picked up on 06/21/2024

## 2024-06-25 ENCOUNTER — Ambulatory Visit

## 2024-06-25 DIAGNOSIS — Z0000 Encounter for general adult medical examination without abnormal findings: Secondary | ICD-10-CM

## 2024-06-25 NOTE — Patient Instructions (Signed)
 Laura Maynard,  Thank you for taking the time for your Medicare Wellness Visit. I appreciate your continued commitment to your health goals. Please review the care plan we discussed, and feel free to reach out if I can assist you further.  Medicare recommends these wellness visits once per year to help you and your care team stay ahead of potential health issues. These visits are designed to focus on prevention, allowing your provider to concentrate on managing your acute and chronic conditions during your regular appointments.  Please note that Annual Wellness Visits do not include a physical exam. Some assessments may be limited, especially if the visit was conducted virtually. If needed, we may recommend a separate in-person follow-up with your provider.  Ongoing Care Seeing your primary care provider every 3 to 6 months helps us  monitor your health and provide consistent, personalized care.   Referrals If a referral was made during today's visit and you haven't received any updates within two weeks, please contact the referred provider directly to check on the status.  Recommended Screenings:  Health Maintenance  Topic Date Due   Zoster (Shingles) Vaccine (1 of 2) 01/06/1966   Breast Cancer Screening  10/04/2023   Flu Shot  04/30/2024   COVID-19 Vaccine (4 - 2025-26 season) 05/31/2024   Medicare Annual Wellness Visit  06/25/2025   DTaP/Tdap/Td vaccine (3 - Tdap) 08/01/2032   Pneumococcal Vaccine for age over 33  Completed   DEXA scan (bone density measurement)  Completed   Hepatitis C Screening  Completed   HPV Vaccine  Aged Out   Meningitis B Vaccine  Aged Out   Cologuard (Stool DNA test)  Discontinued       06/25/2024    9:38 AM  Advanced Directives  Does Patient Have a Medical Advance Directive? Yes  Type of Estate agent of [place];Living will  Copy of Healthcare Power of Attorney in Chart? No - copy requested   Advance Care Planning is important  because it: Ensures you receive medical care that aligns with your values, goals, and preferences. Provides guidance to your family and loved ones, reducing the emotional burden of decision-making during critical moments.  Vision: Annual vision screenings are recommended for early detection of glaucoma, cataracts, and diabetic retinopathy. These exams can also reveal signs of chronic conditions such as diabetes and high blood pressure.  Dental: Annual dental screenings help detect early signs of oral cancer, gum disease, and other conditions linked to overall health, including heart disease and diabetes.  Please see the attached documents for additional preventive care recommendations.

## 2024-06-25 NOTE — Progress Notes (Signed)
 Subjective:   Laura Maynard is a 78 y.o. who presents for a Medicare Wellness preventive visit.  As a reminder, Annual Wellness Visits don't include a physical exam, and some assessments may be limited, especially if this visit is performed virtually. We may recommend an in-person follow-up visit with your provider if needed.  Visit Complete: Virtual I connected with  Laura Maynard on 06/25/24 by a audio enabled telemedicine application and verified that I am speaking with the correct person using two identifiers.  Patient Location: Home  Provider Location: Office/Clinic  I discussed the limitations of evaluation and management by telemedicine. The patient expressed understanding and agreed to proceed.  Vital Signs: Because this visit was a virtual/telehealth visit, some criteria may be missing or patient reported. Any vitals not documented were not able to be obtained and vitals that have been documented are patient reported.  VideoError- Librarian, academic were attempted between this provider and patient, however failed, due to patient having technical difficulties OR patient did not have access to video capability.  We continued and completed visit with audio only.   Persons Participating in Visit: Patient.  AWV Questionnaire: No: Patient Medicare AWV questionnaire was not completed prior to this visit.  Cardiac Risk Factors include: advanced age (>27men, >23 women);dyslipidemia     Objective:    Today's Vitals   There is no height or weight on file to calculate BMI.     06/25/2024    9:38 AM 04/28/2023   10:25 AM 08/01/2022   10:40 AM 07/31/2021    9:14 AM 04/19/2020    9:49 AM 12/29/2018    8:52 AM 02/05/2018   10:28 AM  Advanced Directives  Does Patient Have a Medical Advance Directive? Yes Yes Yes Yes Yes Yes  Yes   Type of Estate agent of [place];Living will Living will;Healthcare Power of [employer] Power of  [place];Living will Healthcare Power of [place];Living will Healthcare Power of [place];Living will Healthcare Power of [place];Living will Healthcare Power of [place];Living will  Does patient want to make changes to medical advance directive?  No - Patient declined No - Patient declined No - Patient declined No - Patient declined No - Patient declined    Copy of Healthcare Power of Attorney in Chart? No - copy requested No - copy requested No - copy requested No - copy requested No - copy requested No - copy requested  No - copy requested      Data saved with a previous flowsheet row definition    Current Medications (verified) Outpatient Encounter Medications as of 06/25/2024  Medication Sig   Acetaminophen 325 MG CAPS Take by mouth.   AYR SALINE NASAL NA Place into the nose.   Bempedoic Acid  (NEXLETOL ) 180 MG TABS Take by mouth.   butalbital -acetaminophen-caffeine  (FIORICET) 50-325-40 MG tablet TAKE 1 TABLET  EVERY 4 HOURS AS NEEDED FOR SEVERE HEADACHE                                                                               /  TAKE                                                   BY                                        MOUTH   cholecalciferol (VITAMIN D3) 25 MCG (1000 UNIT) tablet Take 1 tablet (1,000 Units total) by mouth 2 (two) times daily with a meal.   ciprofloxacin -dexamethasone  (CIPRODEX) OTIC suspension 4 drops 2 (two) times daily.   clobetasol  (TEMOVATE ) 0.05 % external solution Apply 1 Application topically 2 (two) times daily. use one or two times a day PRN to affected areas   Cyanocobalamin  (VITAMIN B 12 PO) Take by mouth.   cycloSPORINE (RESTASIS) 0.05 % ophthalmic emulsion Apply to eye.   diclofenac  Sodium (VOLTAREN ) 1 % GEL Apply 4 g topically 4 (four) times daily as needed.   Evolocumab  (REPATHA  SURECLICK) 140 MG/ML SOAJ    fluticasone  (FLONASE ) 50 MCG/ACT nasal spray SHAKE LIQUID AND USE 1 TO 2 SPRAYS IN EACH NOSTRIL TWICE  DAILY AS NEEDED FOR ALLERGIES   HYDROcodone-acetaminophen (NORCO/VICODIN) 5-325 MG tablet Take 1 tablet by mouth every 6 (six) hours as needed.   inclisiran (LEQVIO ) 284 MG/1.5ML SOSY injection Inject 284mg  SQ at month 0 and month 3, then every 6 months   levothyroxine  (SYNTHROID ) 50 MCG tablet Take 1.5 tablets (75 mcg total) by mouth daily before breakfast.   LORazepam  (ATIVAN ) 0.5 MG tablet Take 0.5 mg by mouth 4 (four) times daily as needed.   meclizine  (ANTIVERT ) 25 MG tablet Take      1 tablet      3 x day       if needed for Dizziness /Vertigo   metroNIDAZOLE  (METROGEL ) 1 % gel Apply topically daily. Apply  Daily  to Acne Rosacea rash   Naproxen Sodium (ALEVE PO) Take by mouth. PRN   NEOMYCIN -POLYMYXIN-HYDROCORTISONE  (CORTISPORIN) 1 % SOLN OTIC solution Apply  6 to 8 drops to affected ear 3 to 4 x /day   rosuvastatin  (CRESTOR ) 20 MG tablet    SUMAtriptan  (IMITREX ) 100 MG tablet May repeat in 2 hours if headache persists or recurs. Max: 2 pills a day   SUMAtriptan  (IMITREX ) 25 MG tablet Take 1 tablet (25 mg total) by mouth as needed for migraine.   Vitamin D , Ergocalciferol , (DRISDOL ) 1.25 MG (50000 UNIT) CAPS capsule Take 1 capsule (50,000 Units total) by mouth every 7 (seven) days.   No facility-administered encounter medications on file as of 06/25/2024.    Allergies (verified) Penicillins, Sulfa antibiotics, Zetia [ezetimibe], Doxycycline , Gabapentin , Pravastatin, and Rosuvastatin    History: 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   Essential hypertension 02/19/2024   High cholesterol    Hypertension    Hypothyroidism    Labile hypertension    Migraine    Prediabetes    Sarcoidosis, lung 15 YRS AGO   NO TREATMENTS   Toxic multinodular goiter 05/27/2014   Vertigo    Vitamin D  deficiency    Past Surgical History:  Procedure Laterality Date   BACK SURGERY  1970   CATARACT EXTRACTION Left 12/2017    COLONOSCOPY  2006; 03/28/11  2 small adenomas; normal   EYE SURGERY  1964   right eye   KNEE ARTHROSCOPY  2002   NASAL SEPTUM SURGERY  1998   TOTAL ABDOMINAL HYSTERECTOMY  2000   Family History  Problem Relation Age of Onset   Lung cancer Sister 44   Throat cancer Brother 50   Stroke Mother    Emphysema Father    COPD Sister    Diabetes Brother    Colon cancer Neg Hx    Social History   Socioeconomic History   Marital status: Divorced    Spouse name: Not on file   Number of children: 0   Years of education: college   Highest education level: Not on file  Occupational History    Comment: Retired  Tobacco Use   Smoking status: Never   Smokeless tobacco: Never  Vaping Use   Vaping status: Never Used  Substance and Sexual Activity   Alcohol use: Never    Alcohol/week: 1.0 standard drink of alcohol    Types: 1 Standard drinks or equivalent per week    Comment: RARE   Drug use: Never   Sexual activity: Not Currently  Other Topics Concern   Not on file  Social History Narrative   Patient is retired and lives at home alone. Patient  has college education.   Caffeine - 3 cups of caffeine  daily.  One soda daily.   Right handed.   Social Drivers of Corporate investment banker Strain: Low Risk  (06/25/2024)   Overall Financial Resource Strain (CARDIA)    Difficulty of Paying Living Expenses: Not hard at all  Food Insecurity: No Food Insecurity (06/25/2024)   Hunger Vital Sign    Worried About Running Out of Food in the Last Year: Never true    Ran Out of Food in the Last Year: Never true  Transportation Needs: No Transportation Needs (06/25/2024)   PRAPARE - Administrator, Civil Service (Medical): No    Lack of Transportation (Non-Medical): No  Physical Activity: Inactive (06/25/2024)   Exercise Vital Sign    Days of Exercise per Week: 0 days    Minutes of Exercise per Session: 0 min  Stress: No Stress Concern Present (06/25/2024)   [employer] of  Occupational Health - Occupational Stress Questionnaire    Feeling of Stress: Only a little  Social Connections: Moderately Isolated (06/25/2024)   Social Connection and Isolation Panel    Frequency of Communication with Friends and Family: More than three times a week    Frequency of Social Gatherings with Friends and Family: Twice a week    Attends Religious Services: More than 4 times per year    Active Member of [employer] or Organizations: No    Attends Banker Meetings: Never    Marital Status: Divorced    Tobacco Counseling Counseling given: Not Answered    Clinical Intake:  Pre-visit preparation completed: Yes  Pain : No/denies pain     Nutritional Risks: None Diabetes: No  Lab Results  Component Value Date   HGBA1C 5.6 02/19/2024   HGBA1C 5.5 07/30/2023   HGBA1C 5.5 01/27/2023     How often do you need to have someone help you when you read instructions, pamphlets, or other written materials from your doctor or pharmacy?: 1 - Never  Interpreter Needed?: No  Information entered by :: NAllen LPN   Activities of Daily Living     06/25/2024    9:29 AM 09/18/2023  10:53 PM  In your present state of health, do you have any difficulty performing the following activities:  Hearing? 1 0  Comment has appointment with ENT   Vision? 0 0  Difficulty concentrating or making decisions? 0 0  Walking or climbing stairs? 1 0  Comment due to bad knees   Dressing or bathing? 0 0  Doing errands, shopping? 0 0  Preparing Food and eating ? N   Using the Toilet? N   In the past six months, have you accidently leaked urine? N   Do you have problems with loss of bowel control? N   Managing your Medications? N   Managing your Finances? N   Housekeeping or managing your Housekeeping? N     Patient Care Team: Sebastian Beverley NOVAK, MD as PCP - General (Family Medicine) Mona Vinie BROCKS, MD as PCP - Cardiology (Cardiology) Milissa Lin, MD (Dermatology) Trixie File, MD as Consulting Physician (Internal Medicine) Onita Duos, MD as Consulting Physician (Neurology) Avram Lupita BRAVO, MD as Consulting Physician (Gastroenterology) Melodi Lerner, MD as Consulting Physician (Orthopedic Surgery)  I have updated your Care Teams any recent Medical Services you may have received from other providers in the past year.     Assessment:   This is a routine wellness examination for Laura Maynard.  Hearing/Vision screen Hearing Screening - Comments:: Has an appointment with ENT Vision Screening - Comments:: Regular eye exams, [facility]   Goals Addressed             This Visit's Progress    Patient Stated       06/25/2024, wants to eat healthier       Depression Screen     06/25/2024    9:41 AM 06/17/2024    3:08 PM 05/14/2024    3:00 PM 05/11/2024    4:36 PM 04/27/2024    2:03 PM 02/19/2024    8:59 AM 02/19/2024    8:42 AM  PHQ 2/9 Scores  PHQ - 2 Score 0 0 0 0 0 0 0  PHQ- 9 Score      0     Fall Risk     06/25/2024    9:40 AM 06/17/2024    3:08 PM 05/14/2024    3:00 PM 05/11/2024    4:36 PM 04/27/2024    2:03 PM  Fall Risk   Falls in the past year? 0 0 0 0 0  Number falls in past yr: 0 0 0 0 0  Injury with Fall? 0 0 0 0 0  Risk for fall due to : Medication side effect Other (Comment) No Fall Risks No Fall Risks No Fall Risks  Follow up Falls evaluation completed;Falls prevention discussed Falls evaluation completed Falls evaluation completed Falls evaluation completed Falls prevention discussed    MEDICARE RISK AT HOME:  Medicare Risk at Home Any stairs in or around the home?: Yes If so, are there any without handrails?: No Home free of loose throw rugs in walkways, pet beds, electrical cords, etc?: Yes Adequate lighting in your home to reduce risk of falls?: Yes Life alert?: No Use of a cane, walker or w/c?: No Grab bars in the bathroom?: Yes Shower chair or bench in shower?: No Elevated toilet seat or a handicapped  toilet?: No  TIMED UP AND GO:  Was the test performed?  No  Cognitive Function: 6CIT completed        06/25/2024    9:41 AM  6CIT Screen  What  Year? 0 points  What month? 0 points  What time? 0 points  Count back from 20 0 points  Months in reverse 0 points  Repeat phrase 4 points  Total Score 4 points    Immunizations Immunization History  Administered Date(s) Administered   INFLUENZA, HIGH DOSE SEASONAL PF 06/26/2015, 07/25/2016, 05/26/2017, 06/30/2018, 06/10/2019, 06/26/2020, 07/02/2021, 07/03/2022, 08/07/2023   Influenza Split 06/30/2013   PFIZER(Purple Top)SARS-COV-2 Vaccination 11/26/2019, 12/25/2019, 06/30/2020   Pneumococcal Conjugate-13 09/14/2014   Pneumococcal Polysaccharide-23 10/16/2011, 02/04/2017   Td 10/16/2011, 08/01/2022   Zoster, Live 01/14/2007    Screening Tests Health Maintenance  Topic Date Due   Zoster Vaccines- Shingrix (1 of 2) 01/06/1966   Mammogram  10/04/2023   Influenza Vaccine  04/30/2024   COVID-19 Vaccine (4 - 2025-26 season) 05/31/2024   Medicare Annual Wellness (AWV)  06/25/2025   DTaP/Tdap/Td (3 - Tdap) 08/01/2032   Pneumococcal Vaccine: 50+ Years  Completed   DEXA SCAN  Completed   Hepatitis C Screening  Completed   HPV VACCINES  Aged Out   Meningococcal B Vaccine  Aged Out   Fecal DNA (Cologuard)  Discontinued    Health Maintenance Items Addressed: Vaccines Due: flu and shingles vaccine.  Additional Screening:  Vision Screening: Recommended annual ophthalmology exams for early detection of glaucoma and other disorders of the eye. Is the patient up to date with their annual eye exam?  Yes  Who is the provider or what is the name of the office in which the patient attends annual eye exams? [facility]  Dental Screening: Recommended annual dental exams for proper oral hygiene  Community Resource Referral / Chronic Care Management: CRR required this visit?  No   CCM required this visit?  No   Plan:    I have  personally reviewed and noted the following in the patient's chart:   Medical and social history Use of alcohol, tobacco or illicit drugs  Current medications and supplements including opioid prescriptions. Patient is not currently taking opioid prescriptions. Functional ability and status Nutritional status Physical activity Advanced directives List of other physicians Hospitalizations, surgeries, and ER visits in previous 12 months Vitals Screenings to include cognitive, depression, and falls Referrals and appointments  In addition, I have reviewed and discussed with patient certain preventive protocols, quality metrics, and best practice recommendations. A written personalized care plan for preventive services as well as general preventive health recommendations were provided to patient.   Ardella FORBES Dawn, LPN   [id number]   After Visit Summary: (Pick Up) Due to this being a telephonic visit, with patients personalized plan was offered to patient and patient has requested to Pick up at office.  Notes: Nothing significant to report at this time.

## 2024-06-29 ENCOUNTER — Institutional Professional Consult (permissible substitution) (INDEPENDENT_AMBULATORY_CARE_PROVIDER_SITE_OTHER): Admitting: Otolaryngology

## 2024-07-01 DIAGNOSIS — H9313 Tinnitus, bilateral: Secondary | ICD-10-CM | POA: Diagnosis not present

## 2024-07-01 DIAGNOSIS — G43909 Migraine, unspecified, not intractable, without status migrainosus: Secondary | ICD-10-CM | POA: Diagnosis not present

## 2024-07-01 DIAGNOSIS — H903 Sensorineural hearing loss, bilateral: Secondary | ICD-10-CM | POA: Diagnosis not present

## 2024-07-02 ENCOUNTER — Ambulatory Visit (INDEPENDENT_AMBULATORY_CARE_PROVIDER_SITE_OTHER)

## 2024-07-02 DIAGNOSIS — Z23 Encounter for immunization: Secondary | ICD-10-CM

## 2024-07-22 ENCOUNTER — Ambulatory Visit

## 2024-07-22 VITALS — BP 124/81 | HR 88 | Temp 98.3°F | Resp 18 | Ht 66.0 in | Wt 164.6 lb

## 2024-07-22 DIAGNOSIS — E785 Hyperlipidemia, unspecified: Secondary | ICD-10-CM

## 2024-07-22 MED ORDER — INCLISIRAN SODIUM 284 MG/1.5ML ~~LOC~~ SOSY
284.0000 mg | PREFILLED_SYRINGE | Freq: Once | SUBCUTANEOUS | Status: AC
  Administered 2024-07-22: 284 mg via SUBCUTANEOUS
  Filled 2024-07-22: qty 1.5

## 2024-07-22 NOTE — Progress Notes (Signed)
 Diagnosis: Hyperlipidemia  Provider:  Chilton Greathouse MD  Procedure: Injection  Leqvio (inclisiran), Dose: 284 mg, Site: subcutaneous, Number of injections: 1  Injection Site(s): Right arm  Post Care:  right arm injection  Discharge: Condition: Good, Destination: Home . AVS Provided  Performed by:  Rico Ala, LPN

## 2024-07-23 ENCOUNTER — Other Ambulatory Visit: Payer: Self-pay | Admitting: Internal Medicine

## 2024-07-23 DIAGNOSIS — E785 Hyperlipidemia, unspecified: Secondary | ICD-10-CM

## 2024-07-27 ENCOUNTER — Emergency Department (HOSPITAL_BASED_OUTPATIENT_CLINIC_OR_DEPARTMENT_OTHER)
Admission: EM | Admit: 2024-07-27 | Discharge: 2024-07-27 | Disposition: A | Discharge status: Home / Self Care | Attending: Emergency Medicine | Admitting: Emergency Medicine

## 2024-07-27 ENCOUNTER — Emergency Department (HOSPITAL_BASED_OUTPATIENT_CLINIC_OR_DEPARTMENT_OTHER)

## 2024-07-27 DIAGNOSIS — E890 Postprocedural hypothyroidism: Secondary | ICD-10-CM | POA: Diagnosis not present

## 2024-07-27 DIAGNOSIS — R0989 Other specified symptoms and signs involving the circulatory and respiratory systems: Secondary | ICD-10-CM | POA: Diagnosis not present

## 2024-07-27 DIAGNOSIS — Z79899 Other long term (current) drug therapy: Secondary | ICD-10-CM | POA: Insufficient documentation

## 2024-07-27 DIAGNOSIS — R112 Nausea with vomiting, unspecified: Secondary | ICD-10-CM

## 2024-07-27 DIAGNOSIS — J9811 Atelectasis: Secondary | ICD-10-CM | POA: Diagnosis not present

## 2024-07-27 DIAGNOSIS — R519 Headache, unspecified: Secondary | ICD-10-CM | POA: Diagnosis not present

## 2024-07-27 DIAGNOSIS — R0602 Shortness of breath: Secondary | ICD-10-CM | POA: Diagnosis not present

## 2024-07-27 DIAGNOSIS — G43809 Other migraine, not intractable, without status migrainosus: Secondary | ICD-10-CM | POA: Diagnosis not present

## 2024-07-27 DIAGNOSIS — I10 Essential (primary) hypertension: Secondary | ICD-10-CM | POA: Insufficient documentation

## 2024-07-27 LAB — CBC WITH DIFFERENTIAL/PLATELET
Abs Immature Granulocytes: 0.01 K/uL (ref 0.00–0.07)
Basophils Absolute: 0 K/uL (ref 0.0–0.1)
Basophils Relative: 1 %
Eosinophils Absolute: 0 K/uL (ref 0.0–0.5)
Eosinophils Relative: 0 %
HCT: 37.3 % (ref 36.0–46.0)
Hemoglobin: 12.7 g/dL (ref 12.0–15.0)
Immature Granulocytes: 0 %
Lymphocytes Relative: 15 %
Lymphs Abs: 0.8 K/uL (ref 0.7–4.0)
MCH: 28.6 pg (ref 26.0–34.0)
MCHC: 34 g/dL (ref 30.0–36.0)
MCV: 84 fL (ref 80.0–100.0)
Monocytes Absolute: 0.4 K/uL (ref 0.1–1.0)
Monocytes Relative: 7 %
Neutro Abs: 4.1 K/uL (ref 1.7–7.7)
Neutrophils Relative %: 77 %
Platelets: 377 K/uL (ref 150–400)
RBC: 4.44 MIL/uL (ref 3.87–5.11)
RDW: 14.2 % (ref 11.5–15.5)
WBC: 5.4 K/uL (ref 4.0–10.5)
nRBC: 0 % (ref 0.0–0.2)

## 2024-07-27 LAB — COMPREHENSIVE METABOLIC PANEL WITH GFR
ALT: 13 U/L (ref 0–44)
AST: 25 U/L (ref 15–41)
Albumin: 4.3 g/dL (ref 3.5–5.0)
Alkaline Phosphatase: 130 U/L — ABNORMAL HIGH (ref 38–126)
Anion gap: 11 (ref 5–15)
BUN: 8 mg/dL (ref 8–23)
CO2: 23 mmol/L (ref 22–32)
Calcium: 9.9 mg/dL (ref 8.9–10.3)
Chloride: 108 mmol/L (ref 98–111)
Creatinine, Ser: 0.62 mg/dL (ref 0.44–1.00)
GFR, Estimated: >60 mL/min (ref >60)
Glucose, Bld: 103 mg/dL — ABNORMAL HIGH (ref 70–99)
Potassium: 3.8 mmol/L (ref 3.5–5.1)
Sodium: 142 mmol/L (ref 135–145)
Total Bilirubin: 0.5 mg/dL (ref 0.0–1.2)
Total Protein: 7 g/dL (ref 6.5–8.1)

## 2024-07-27 LAB — TROPONIN T, HIGH SENSITIVITY
Troponin T High Sensitivity: <15 ng/L (ref 0–19)
Troponin T High Sensitivity: <15 ng/L (ref 0–19)

## 2024-07-27 LAB — URINALYSIS, ROUTINE W REFLEX MICROSCOPIC
Bacteria, UA: NONE SEEN
Bilirubin Urine: NEGATIVE
Glucose, UA: NEGATIVE mg/dL
Ketones, ur: 15 mg/dL — AB
Leukocytes,Ua: NEGATIVE
Nitrite: NEGATIVE
Specific Gravity, Urine: 1.014 (ref 1.005–1.030)
pH: 7.5 (ref 5.0–8.0)

## 2024-07-27 LAB — RESP PANEL BY RT-PCR (RSV, FLU A&B, COVID)  RVPGX2
Influenza A by PCR: NEGATIVE
Influenza B by PCR: NEGATIVE
Resp Syncytial Virus by PCR: NEGATIVE
SARS Coronavirus 2 by RT PCR: NEGATIVE

## 2024-07-27 LAB — PRO BRAIN NATRIURETIC PEPTIDE: Pro Brain Natriuretic Peptide: 142 pg/mL (ref <300.0)

## 2024-07-27 MED ORDER — LACTATED RINGERS IV BOLUS
1000.0000 mL | Freq: Once | INTRAVENOUS | Status: AC
  Administered 2024-07-27: 1000 mL via INTRAVENOUS

## 2024-07-27 MED ORDER — METOCLOPRAMIDE HCL 5 MG/ML IJ SOLN
10.0000 mg | Freq: Once | INTRAMUSCULAR | Status: AC
  Administered 2024-07-27: 10 mg via INTRAVENOUS
  Filled 2024-07-27: qty 2

## 2024-07-27 MED ORDER — ONDANSETRON HCL 4 MG/2ML IJ SOLN
4.0000 mg | Freq: Once | INTRAMUSCULAR | Status: DC
Start: 2024-07-27 — End: 2024-07-27
  Filled 2024-07-27: qty 2

## 2024-07-27 MED ORDER — DIPHENHYDRAMINE HCL 50 MG/ML IJ SOLN
12.5000 mg | Freq: Once | INTRAMUSCULAR | Status: AC
  Administered 2024-07-27: 12.5 mg via INTRAVENOUS
  Filled 2024-07-27: qty 1

## 2024-07-27 MED ORDER — ONDANSETRON HCL 4 MG/2ML IJ SOLN
4.0000 mg | Freq: Once | INTRAMUSCULAR | Status: AC
  Administered 2024-07-27: 4 mg via INTRAVENOUS

## 2024-07-27 MED ORDER — ONDANSETRON 4 MG PO TBDP: 4.0000 mg | ORAL_TABLET | Freq: Three times a day (TID) | ORAL | 0 refills | Status: AC | PRN

## 2024-07-27 MED ORDER — KETOROLAC TROMETHAMINE 15 MG/ML IJ SOLN
15.0000 mg | Freq: Once | INTRAMUSCULAR | Status: AC
  Administered 2024-07-27: 15 mg via INTRAVENOUS
  Filled 2024-07-27: qty 1

## 2024-07-27 NOTE — ED Provider Notes (Signed)
 [place] EMERGENCY DEPARTMENT AT [facility] Provider Note   CSN: [id number] Arrival date & time: 07/27/24  1159     Patient presents with: Shortness of Breath and Emesis   Laura Maynard is a 78 y.o. female.  Patient with past history significant for post ablative hypothyroidism, migraine disorder, insomnia, and vertigo presents emergency department with concerns of vomiting and shortness of breath.  Reports that she has had nonstop vomiting since earlier today starting around 3:30 AM.  She describes a headache and rates pain 5 out of 10.  States that she feels unwell.  Denies any chest pain but does feel some increased difficulty breathing but unclear if this is due to her vomiting.  States that she felt like she may pass out earlier from her vomiting.  Denies any abdominal pain.  No reported diarrhea.   Shortness of Breath Associated symptoms: vomiting   Emesis      Prior to Admission medications   Medication Sig Start Date End Date Taking? Authorizing Provider  ondansetron  (ZOFRAN -ODT) 4 MG disintegrating tablet Take 1 tablet (4 mg total) by mouth every 8 (eight) hours as needed for nausea or vomiting. 07/27/24  Yes Cashton Hosley A, PA-C  Acetaminophen 325 MG CAPS Take by mouth.    [provider]  AYR SALINE NASAL NA Place into the nose.    [provider]  Bempedoic Acid  (NEXLETOL ) 180 MG TABS Take by mouth.    [provider]  butalbital -acetaminophen-caffeine  (FIORICET) 50-325-40 MG tablet TAKE 1 TABLET  EVERY 4 HOURS AS NEEDED FOR SEVERE HEADACHE                                                                               /                                      TAKE                                                   BY                                        MOUTH 02/19/24   Sebastian Beverley NOVAK, MD  cholecalciferol (VITAMIN D3) 25 MCG (1000 UNIT) tablet Take 1 tablet (1,000 Units total) by mouth 2 (two) times daily with a meal. 08/01/21    Jeanine Knee, NP  ciprofloxacin -dexamethasone  (CIPRODEX) OTIC suspension 4 drops 2 (two) times daily. 05/29/24   [provider]  clobetasol  (TEMOVATE ) 0.05 % external solution Apply 1 Application topically 2 (two) times daily. use one or two times a day PRN to affected areas 10/08/23   Alm Delon SAILOR, DO  Cyanocobalamin  (VITAMIN B 12 PO) Take by mouth.    [provider]  cycloSPORINE (RESTASIS) 0.05 % ophthalmic emulsion Apply to eye. 05/28/22   [provider]  diclofenac   Sodium (VOLTAREN ) 1 % GEL Apply 4 g topically 4 (four) times daily as needed. 02/19/24   Sebastian Beverley NOVAK, MD  Evolocumab  (REPATHA  SURECLICK) 140 MG/ML SOAJ  03/29/21   [provider]  fluticasone  (FLONASE ) 50 MCG/ACT nasal spray SHAKE LIQUID AND USE 1 TO 2 SPRAYS IN EACH NOSTRIL TWICE DAILY AS NEEDED FOR ALLERGIES    [provider]  HYDROcodone-acetaminophen (NORCO/VICODIN) 5-325 MG tablet Take 1 tablet by mouth every 6 (six) hours as needed. 06/01/24   [provider]  inclisiran (LEQVIO ) 284 MG/1.5ML SOSY injection Inject 284mg  SQ at month 0 and month 3, then every 6 months 10/16/22   Hilty, Vinie BROCKS, MD  levothyroxine  (SYNTHROID ) 50 MCG tablet Take 1.5 tablets (75 mcg total) by mouth daily before breakfast. 05/18/24   Trixie File, MD  LORazepam  (ATIVAN ) 0.5 MG tablet Take 0.5 mg by mouth 4 (four) times daily as needed.    [provider]  meclizine  (ANTIVERT ) 25 MG tablet Take      1 tablet      3 x day       if needed for Dizziness /Vertigo 02/19/24   Sebastian Beverley NOVAK, MD  metroNIDAZOLE  (METROGEL ) 1 % gel Apply topically daily. Apply  Daily  to Acne Rosacea rash 01/27/23   Tonita Fallow, MD  Naproxen Sodium (ALEVE PO) Take by mouth. PRN    [provider]  NEOMYCIN -POLYMYXIN-HYDROCORTISONE  (CORTISPORIN) 1 % SOLN OTIC solution Apply  6 to 8 drops to affected ear 3 to 4 x /day 01/28/23   Tonita Fallow, MD  rosuvastatin  (CRESTOR ) 20 MG tablet      [provider]  SUMAtriptan  (IMITREX ) 100 MG tablet May repeat in 2 hours if headache persists or recurs. Max: 2 pills a day 06/17/24   Sebastian Beverley NOVAK, MD  SUMAtriptan  (IMITREX ) 25 MG tablet Take 1 tablet (25 mg total) by mouth as needed for migraine. 06/17/24   Sebastian Beverley NOVAK, MD  Vitamin D , Ergocalciferol , (DRISDOL ) 1.25 MG (50000 UNIT) CAPS capsule Take 1 capsule (50,000 Units total) by mouth every 7 (seven) days. 02/24/24 02/23/25  Sebastian Beverley NOVAK, MD    Allergies: Penicillins, Sulfa antibiotics, Zetia [ezetimibe], Doxycycline , Gabapentin , Pravastatin, and Rosuvastatin     Review of Systems  Respiratory:  Positive for shortness of breath.   Gastrointestinal:  Positive for vomiting.  All other systems reviewed and are negative.   Updated Vital Signs BP 132/67 (BP Location: Left Arm)   Pulse 83   Temp 98 F (36.7 C) (Oral)   Resp (!) 22   SpO2 100%   Physical Exam Vitals and nursing note reviewed.  Constitutional:      General: She is not in acute distress.    Appearance: She is well-developed.  HENT:     Head: Normocephalic and atraumatic.  Eyes:     Conjunctiva/sclera: Conjunctivae normal.  Cardiovascular:     Rate and Rhythm: Normal rate and regular rhythm.     Heart sounds: No murmur heard. Pulmonary:     Effort: Pulmonary effort is normal. No respiratory distress.     Breath sounds: Normal breath sounds. No decreased breath sounds, wheezing, rhonchi or rales.  Abdominal:     Palpations: Abdomen is soft.     Tenderness: There is no abdominal tenderness.  Musculoskeletal:        General: No swelling.     Cervical back: Neck supple.  Skin:    General: Skin is warm and dry.     Capillary Refill: Capillary  refill takes less than 2 seconds.  Neurological:     Mental Status: She is alert.  Psychiatric:        Mood and Affect: Mood is anxious.     (all labs ordered are listed, but only abnormal results are displayed) Labs Reviewed  COMPREHENSIVE  METABOLIC PANEL WITH GFR - Abnormal; Notable for the following components:      Result Value   Glucose, Bld 103 (*)    Alkaline Phosphatase 130 (*)    All other components within normal limits  URINALYSIS, ROUTINE W REFLEX MICROSCOPIC - Abnormal; Notable for the following components:   Hgb urine dipstick SMALL (*)    Ketones, ur 15 (*)    Protein, ur TRACE (*)    All other components within normal limits  RESP PANEL BY RT-PCR (RSV, FLU A&B, COVID)  RVPGX2  CBC WITH DIFFERENTIAL/PLATELET  PRO BRAIN NATRIURETIC PEPTIDE  TROPONIN T, HIGH SENSITIVITY  TROPONIN T, HIGH SENSITIVITY    EKG: EKG Interpretation Date/Time:  Tuesday July 27 2024 12:04:06 EDT Ventricular Rate:  94 PR Interval:  142 QRS Duration:  82 QT Interval:  326 QTC Calculation: 407 R Axis:   -23  Text Interpretation: Normal sinus rhythm Possible Inferior infarct , age undetermined When compared with ECG of 07-Nov-2015 07:26, PREVIOUS ECG IS PRESENT Confirmed by Cottie Cough [phone]) on 07/27/2024 12:20:08 PM  Radiology: CT Head Wo Contrast Result Date: 07/27/2024 EXAM: CT HEAD WITHOUT CONTRAST 07/27/2024 01:45:59 PM TECHNIQUE: CT of the head was performed without the administration of intravenous contrast. Automated exposure control, iterative reconstruction, and/or weight based adjustment of the mA/kV was utilized to reduce the radiation dose to as low as reasonably achievable. COMPARISON: Brain MRI 05/17/2024. Head CT 11/07/2015. CLINICAL HISTORY: 78 year old female. Sudden, severe headache with nonstop vomiting and shortness of breath. Denies fevers. FINDINGS: BRAIN AND VENTRICLES: No acute hemorrhage. No evidence of acute infarct. No hydrocephalus. No extra-axial collection. No mass effect or midline shift. Mild for age skull base calcified atherosclerosis. No suspicious intracranial vascular hyperdensity. ORBITS: No acute abnormality. SINUSES: No abnormality. SOFT TISSUES AND SKULL: No acute soft tissue  abnormality. No skull fracture. IMPRESSION: 1. Normal for age non-contrast head CT Electronically signed by: Helayne Hurst MD 07/27/2024 01:59 PM EDT RP Workstation: [id number]   DG Chest Portable 1 View Result Date: 07/27/2024 CLINICAL DATA:  Shortness of breath after vomiting. History of hypertension. EXAM: PORTABLE CHEST 1 VIEW COMPARISON:  Radiographs 06/15/2018 and 01/31/2011. Cardiac CT 06/22/2019. FINDINGS: 1228 hours. Low lung volumes with mild atelectasis at both lung bases. There is no confluent airspace disease, pneumothorax or significant pleural effusion. The heart size and mediastinal contours are stable with mild aortic atherosclerosis. The bones appear unremarkable. Telemetry leads overlie the chest. IMPRESSION: Low lung volumes with mild bibasilar atelectasis. No evidence of pneumonia or edema. Electronically Signed   By: Elsie Perone M.D.   On: 07/27/2024 13:08     Procedures   Medications Ordered in the ED  lactated ringers bolus 1,000 mL ( Intravenous Stopped 07/27/24 1338)  diphenhydrAMINE  (BENADRYL ) injection 12.5 mg (12.5 mg Intravenous Given 07/27/24 1329)  metoCLOPramide  (REGLAN ) injection 10 mg (10 mg Intravenous Given 07/27/24 1331)  ondansetron  (ZOFRAN ) injection 4 mg (4 mg Intravenous Given by Other 07/27/24 1320)  ketorolac  (TORADOL ) 15 MG/ML injection 15 mg (15 mg Intravenous Given 07/27/24 1430)  Medical Decision Making Amount and/or Complexity of Data Reviewed Labs: ordered. Radiology: ordered.  Risk Prescription drug management.   This patient presents to the ED for concern of shortness of breath, vomiting, this involves an extensive number of treatment options, and is a complaint that carries with it a high risk of complications and morbidity.  The differential diagnosis includes ACS, PE, gastroenteritis, bowel obstruction   Co morbidities that complicate the patient evaluation  Prediabetes, vertigo, postablative  hypothyroidism, migraine disorder   Additional history obtained:  Additional history obtained from epic chart review   Lab Tests:  I Ordered, and personally interpreted labs.  The pertinent results include: CBC unremarkable, CMP unremarkable, troponin negative at less than 15 with repeat pending, proBNP negative at 142, UA negative for infection, respiratory panel negative for COVID, influenza, RSV   Imaging Studies ordered:  I ordered imaging studies including chest x-ray, CT head I independently visualized and interpreted imaging which showed no acute cardiopulmonary process, no intracranial normality I agree with the radiologist interpretation   Cardiac Monitoring: / EKG:  The patient was maintained on a cardiac monitor.  I personally viewed and interpreted the cardiac monitored which showed an underlying rhythm of: Sinus rhythm   Consultations Obtained:  I requested consultation with none,  and discussed lab and imaging findings as well as pertinent plan - they recommend: N/A   Problem List / ED Course / Critical interventions / Medication management  Patient presented to the emergency department today with concerns of vomiting shortness of breath.  She states that she began to vomit around 330 this morning presenting feelings of shortness of breath intermittently.  Denies any chest pain.  States that she felt dizzy and lightheaded but this has improved.  She is still not tolerating p.o. On exam, patient is anxious appearing.  There is no focal abdominal pain, tenderness, or guarding.  Normal bowel sounds.  No abnormal heart or lung sounds. Patient's workup is unremarkable.  No significant abnormality seen explain any of her symptoms.  Chest x-ray and CT head negative.  Some improvement in her nausea after she was administered Zofran , Reglan , and headache slightly improved after Reglan  and Toradol .  She is requesting to be discharged.  She states that she is no longer nauseous.   Will discharge home with a prescription for Zofran  for continued nausea control.  Given that she is requesting discharge and otherwise appears well, do not feel that she requires admission at this time.  Return precautions discussed such as concerns for worsening symptoms.  Discharged home in stable condition. I ordered medication including fluids, Reglan , Benadryl , Zofran , Toradol  for migraine cocktail Reevaluation of the patient after these medicines showed that the patient improved I have reviewed the patients home medicines and have made adjustments as needed   Social Determinants of Health:  None   Test / Admission - Considered:  Considered but stable for outpatient follow-up.   Final diagnoses:  Other migraine without status migrainosus, not intractable  Nausea and vomiting, unspecified vomiting type    ED Discharge Orders          Ordered    ondansetron  (ZOFRAN -ODT) 4 MG disintegrating tablet  Every 8 hours PRN        07/27/24 1443               Santiaga Butzin A, PA-C 07/27/24 1448    Cottie Donnice PARAS, MD 07/27/24 1807

## 2024-07-27 NOTE — ED Triage Notes (Signed)
 Reports nonstop vomiting since this morning. States SHOB started after vomiting. Denies fevers.

## 2024-07-27 NOTE — Discharge Instructions (Addendum)
 You were seen in the ER today for concerns of vomiting and shortness of breath. Your labs and imaging were reassuring with no abnormal findings to explain your symptoms. I would recommend continuing to hydrate gently and return to the ER for any concerns of worsening symptoms.

## 2024-08-06 ENCOUNTER — Encounter: Payer: Self-pay | Admitting: Family Medicine

## 2024-08-06 ENCOUNTER — Ambulatory Visit: Admitting: Family Medicine

## 2024-08-06 VITALS — BP 130/74 | HR 84 | Temp 97.5°F | Ht 66.0 in | Wt 162.0 lb

## 2024-08-06 DIAGNOSIS — G43009 Migraine without aura, not intractable, without status migrainosus: Secondary | ICD-10-CM

## 2024-08-06 MED ORDER — AMITRIPTYLINE HCL 25 MG PO TABS: 25.0000 mg | ORAL_TABLET | Freq: Every day | ORAL | 3 refills | Status: AC

## 2024-08-06 NOTE — Progress Notes (Signed)
 [facility] [address] [place] [address] Dept: [phone] Dept Fax: [phone]  Office Visit  Subjective:    Patient ID: Erminio MARLA Molt, female    DOB: 20-Jul-1946, 78 y.o..   MRN: [id number]  Chief Complaint  Patient presents with   Hospitalization Follow-up    Hospital f/u from 10//28/25 (headaches).     History of Present Illness:  Patient is in today for hospital follow-up.  Ms. Dicola was seen at the [facility] ED on 07/27/2024 for migraine with associated nausea, vomiting, and shortness of breath. Given diphenhydramine  injection 12.5 mg, metoclopramide  injection 10 mg, ondansetron  injection 4 mg, and ketorolac  15 mg injection to relieve her symptoms. She notes this did not fully resolve the headache, but that it is better now. She notes she had woken up in the middle of the night with a headache. She developed recurrent nausea and vomiting. She took her Fioricet, but it did not resolve her headache. She describes having a CT scan, lab tests, a chest x-ray and an EKG in the ED. After 5 hours, she decided to leave.  Ms. Lenig has a longstanding history of migraine headaches. She has seen headache specialist int he past. She had last seen Dr. Sebastian in Sept. He had recommended she stop using Fioricet. He had prescribed Imitrex  instead. she has not tried using this.  Ms. Selway also describes having tinnitus for the past month. She finds this ot be disconcerting and feels it may make her migraines worse.  Past Medical History: Patient Active Problem List   Diagnosis Date Noted   B12 deficiency 02/19/2024   Vertigo 02/19/2024   Episodic tension-type headache, not intractable 02/19/2024   Arthralgia of left knee 02/16/2024   Seborrheic dermatitis of scalp 12/18/2023   Alopecia 12/18/2023   Blindness, one eye, low vision other eye, unspecified eyes 12/18/2023   Gastrointestinal complaint 12/18/2023   Change  in bowel habits 10/03/2023   Blood in urine 07/18/2022   Right sided sciatica 03/29/2021   Insomnia 07/14/2020   Migraine without aura and without status migrainosus, not intractable 04/19/2020   Statin myopathy 04/17/2020   Seasonal allergic conjunctivitis 03/06/2020   Dry eye syndrome, bilateral 03/06/2020   Osteoarthritis of knees, bilateral 05/27/2019   Sialectasis 04/21/2019   Abnormal glucose 09/17/2018   FH: hypertension 06/14/2018   Pseudophakia of left eye 03/12/2018   Aphakia, right eye 01/15/2018   Cataract, nuclear sclerotic, left eye 01/14/2018   Cataract cortical, senile, left 01/14/2018   Multinodular goiter 08/28/2017   Medication Phobia 02/04/2017   BMI 27.0-27.9,adult 07/27/2015   Postablative hypothyroidism 07/06/2015   Benign paroxysmal positional vertigo 09/14/2014   TMJ (temporomandibular joint syndrome) 09/14/2014   Poor compliance with medication 05/29/2014   Hyperlipidemia LDL goal <70 02/08/2014   History of prediabetes    Vitamin D  deficiency    Past Surgical History:  Procedure Laterality Date   BACK SURGERY  1970   CATARACT EXTRACTION Left 12/2017   COLONOSCOPY  2006; 03/28/11   2 small adenomas; normal   EYE SURGERY  1964   right eye   KNEE ARTHROSCOPY  2002   NASAL SEPTUM SURGERY  1998   TOTAL ABDOMINAL HYSTERECTOMY  2000   Family History  Problem Relation Age of Onset   Lung cancer Sister 31   Throat cancer Brother 53   Stroke Mother    Emphysema Father    COPD Sister    Diabetes Brother    Colon cancer  Neg Hx    Outpatient Medications Prior to Visit  Medication Sig Dispense Refill   Acetaminophen 325 MG CAPS Take by mouth.     AYR SALINE NASAL NA Place into the nose.     Bempedoic Acid  (NEXLETOL ) 180 MG TABS Take by mouth.     butalbital -acetaminophen-caffeine  (FIORICET) 50-325-40 MG tablet TAKE 1 TABLET  EVERY 4 HOURS AS NEEDED FOR SEVERE HEADACHE                                                                               /                                       TAKE                                                   BY                                        MOUTH 30 tablet 0   cholecalciferol (VITAMIN D3) 25 MCG (1000 UNIT) tablet Take 1 tablet (1,000 Units total) by mouth 2 (two) times daily with a meal.     ciprofloxacin -dexamethasone  (CIPRODEX) OTIC suspension 4 drops 2 (two) times daily.     clobetasol  (TEMOVATE ) 0.05 % external solution Apply 1 Application topically 2 (two) times daily. use one or two times a day PRN to affected areas 50 mL 0   Cyanocobalamin  (VITAMIN B 12 PO) Take by mouth.     cycloSPORINE (RESTASIS) 0.05 % ophthalmic emulsion Apply to eye.     diclofenac  Sodium (VOLTAREN ) 1 % GEL Apply 4 g topically 4 (four) times daily as needed. 100 g 3   Evolocumab  (REPATHA  SURECLICK) 140 MG/ML SOAJ      fluticasone  (FLONASE ) 50 MCG/ACT nasal spray SHAKE LIQUID AND USE 1 TO 2 SPRAYS IN EACH NOSTRIL TWICE DAILY AS NEEDED FOR ALLERGIES     HYDROcodone-acetaminophen (NORCO/VICODIN) 5-325 MG tablet Take 1 tablet by mouth every 6 (six) hours as needed.     inclisiran (LEQVIO ) 284 MG/1.5ML SOSY injection Inject 284mg  SQ at month 0 and month 3, then every 6 months 1.5 mL 2   levothyroxine  (SYNTHROID ) 50 MCG tablet Take 1.5 tablets (75 mcg total) by mouth daily before breakfast. 150 tablet 3   LORazepam  (ATIVAN ) 0.5 MG tablet Take 0.5 mg by mouth 4 (four) times daily as needed.     meclizine  (ANTIVERT ) 25 MG tablet Take      1 tablet      3 x day       if needed for Dizziness /Vertigo 100 tablet 0   metroNIDAZOLE  (METROGEL ) 1 % gel Apply topically daily. Apply  Daily  to Acne Rosacea rash 60 g 3   Naproxen Sodium (ALEVE PO) Take by mouth. PRN     NEOMYCIN -POLYMYXIN-HYDROCORTISONE  (CORTISPORIN) 1 % SOLN OTIC  solution Apply  6 to 8 drops to affected ear 3 to 4 x /day 10 mL 3   ondansetron  (ZOFRAN -ODT) 4 MG disintegrating tablet Take 1 tablet (4 mg total) by mouth every 8 (eight) hours as needed for nausea or vomiting. 20  tablet 0   rosuvastatin  (CRESTOR ) 20 MG tablet      SUMAtriptan  (IMITREX ) 100 MG tablet May repeat in 2 hours if headache persists or recurs. Max: 2 pills a day 10 tablet 1   SUMAtriptan  (IMITREX ) 25 MG tablet Take 1 tablet (25 mg total) by mouth as needed for migraine. 10 tablet 2   Vitamin D , Ergocalciferol , (DRISDOL ) 1.25 MG (50000 UNIT) CAPS capsule Take 1 capsule (50,000 Units total) by mouth every 7 (seven) days. 12 capsule 3   No facility-administered medications prior to visit.   Allergies  Allergen Reactions   Penicillins Hives   Sulfa Antibiotics Hives   Zetia [Ezetimibe]     Myalgia   Doxycycline  Itching    itching   Gabapentin  Nausea And Vomiting   Pravastatin Other (See Comments)    Myalgia   Rosuvastatin  Other (See Comments)    Joint/muscle aches, fatigue     Objective:   Today's Vitals   08/06/24 1540  BP: 130/74  Pulse: 84  Temp: (!) 97.5 F (36.4 C)  TempSrc: Temporal  SpO2: 97%  Weight: 162 lb (73.5 kg)  Height: 5' 6 (1.676 m)   Body mass index is 26.15 kg/m.   General: Well developed, well nourished. No acute distress. HEENT: Normocephalic, non-traumatic. PERRL, EOMI. Conjunctiva clear. External ears normal. EAC and TMs normal   bilaterally.  Psych: Alert and oriented. Normal mood and affect.  Health Maintenance Due  Topic Date Due   Zoster Vaccines- Shingrix (1 of 2) 01/06/1966   Mammogram  10/04/2023   COVID-19 Vaccine (4 - 2025-26 season) 05/31/2024   Imaging CT Head Wo Contrast Result Date: 07/27/2024 IMPRESSION:  1. Normal for age non-contrast head CT   DG Chest Portable 1 View Result Date: 07/27/2024 IMPRESSION:  Low lung volumes with mild bibasilar atelectasis. No evidence of pneumonia or edema.   Lab Results    Latest Ref Rng & Units 07/27/2024   12:18 PM 05/11/2024    5:06 PM 02/19/2024    9:30 AM  CBC  WBC 4.0 - 10.5 K/uL 5.4  5.9  7.7   Hemoglobin 12.0 - 15.0 g/dL 87.2  87.9  87.5   Hematocrit 36.0 - 46.0 % 37.3  37.5   38.6   Platelets 150 - 400 K/uL 377  389  407.0        Latest Ref Rng & Units 07/27/2024   12:18 PM 02/19/2024    9:35 AM 07/30/2023   10:24 AM  CMP  Glucose 70 - 99 mg/dL 896  83  88   BUN 8 - 23 mg/dL 8  16  10    Creatinine 0.44 - 1.00 mg/dL 9.37  9.12  9.18   Sodium 135 - 145 mmol/L 142  149  141   Potassium 3.5 - 5.1 mmol/L 3.8  4.6  4.2   Chloride 98 - 111 mmol/L 108  111  107   CO2 22 - 32 mmol/L 23  20  28    Calcium  8.9 - 10.3 mg/dL 9.9  9.5  9.8   Total Protein 6.5 - 8.1 g/dL 7.0  7.5  6.8   Total Bilirubin 0.0 - 1.2 mg/dL 0.5  0.3  0.5   Alkaline Phos 38 - 126  U/L 130     AST 15 - 41 U/L 25  16  18    ALT 0 - 44 U/L 13  9  14         Assessment & Plan:   Problem List Items Addressed This Visit       Cardiovascular and Mediastinum   Migraine without aura and without status migrainosus, not intractable - Primary   I reinforced Dr. Oswald recommendation that Ms. Griffee not use Fioricet. I reviewed with her about use of Imitrex  early in a migraine to stop the acute headache. I will start her on amitriptyline  25 mg daily to see if this will reduce the frequency of her migraines. This may also help with her tinnitus tolerance as well.      Relevant Medications   amitriptyline  (ELAVIL ) 25 MG tablet    Return for Follow-up as scheduled with Dr. Sebastian.    Garnette CHRISTELLA Simpler, MD  I,Emily Lagle,acting as a scribe for Garnette CHRISTELLA Simpler, MD.,have documented all relevant documentation on the behalf of Garnette CHRISTELLA Simpler, MD.  I, Garnette CHRISTELLA Simpler, MD, have reviewed all documentation for this visit. The documentation on 08/06/2024 for the exam, diagnosis, procedures, and orders are all accurate and complete.

## 2024-08-06 NOTE — Assessment & Plan Note (Signed)
 I reinforced Dr. Oswald recommendation that Laura Maynard not use Fioricet. I reviewed with her about use of Imitrex  early in a migraine to stop the acute headache. I will start her on amitriptyline  25 mg daily to see if this will reduce the frequency of her migraines. This may also help with her tinnitus tolerance as well.

## 2024-08-13 DIAGNOSIS — R3121 Asymptomatic microscopic hematuria: Secondary | ICD-10-CM | POA: Diagnosis not present

## 2024-08-13 DIAGNOSIS — R350 Frequency of micturition: Secondary | ICD-10-CM | POA: Diagnosis not present

## 2024-08-16 ENCOUNTER — Encounter: Payer: Self-pay | Admitting: Dermatology

## 2024-08-16 ENCOUNTER — Ambulatory Visit: Admitting: Dermatology

## 2024-08-16 VITALS — BP 122/83

## 2024-08-16 DIAGNOSIS — L649 Androgenic alopecia, unspecified: Secondary | ICD-10-CM

## 2024-08-16 DIAGNOSIS — L219 Seborrheic dermatitis, unspecified: Secondary | ICD-10-CM | POA: Diagnosis not present

## 2024-08-16 MED ORDER — CLOBETASOL PROPIONATE 0.05 % EX SOLN: 1.0000 | Freq: Two times a day (BID) | CUTANEOUS | 11 refills | Status: AC

## 2024-08-16 MED ORDER — FLUOCINOLONE ACETONIDE SCALP 0.01 % EX OIL: TOPICAL_OIL | CUTANEOUS | 11 refills | Status: AC

## 2024-08-16 NOTE — Patient Instructions (Addendum)
 VISIT SUMMARY:  Today, we discussed your scalp itchiness and hair thinning. We reviewed your current treatments and made some adjustments to help improve your symptoms and manage your hair loss.  YOUR PLAN:  -SEBORRHEIC DERMATITIS OF THE SCALP:  Seborrheic dermatitis is a condition that causes itchy, inflamed skin, often on the scalp. You should continue using clobetasol  drops as needed for itchiness and use DHS Zinc shampoo weekly, making sure to let it sit on your scalp for a few minutes before rinsing. We also recommend starting DermaSmooth oil treatment, applying it a few hours before shampooing, and then washing it out with DHS Zinc shampoo. A new prescription for clobetasol  drops with ten refills has been provided.  -ANDROGENIC ALOPECIA:  Androgenic alopecia is a common form of hair loss due to hormonal changes and genetic factors. We discussed the option of using a combination of minoxidil and finasteride to prevent further hair loss, although you expressed concerns about the complexity and cost.  You declined starting this today.  If you change your mind please let us  know. It's important to continue treatment to prevent hair loss from resuming.   Additionally, using conditioner can help maintain hair hydration and prevent breakage. We also recommend starting vitamin D  supplementation.  INSTRUCTIONS:  Please follow up in 1 year monitor your progress and make any necessary adjustments to your treatment plan. If you decide to pursue the minoxidil and finasteride combination therapy, please let us  know.    Important Information  Due to recent changes in healthcare laws, you may see results of your pathology and/or laboratory studies on MyChart before the doctors have had a chance to review them. We understand that in some cases there may be results that are confusing or concerning to you. Please understand that not all results are received at the same time and often the doctors may  need to interpret multiple results in order to provide you with the best plan of care or course of treatment. Therefore, we ask that you please give us  2 business days to thoroughly review all your results before contacting the office for clarification. Should we see a critical lab result, you will be contacted sooner.   If You Need Anything After Your Visit  If you have any questions or concerns for your doctor, please call our main line at [phone] If no one answers, please leave a voicemail as directed and we will return your call as soon as possible. Messages left after 4 pm will be answered the following business day.   You may also send us  a message via MyChart. We typically respond to MyChart messages within 1-2 business days.  For prescription refills, please ask your pharmacy to contact our office. Our fax number is [phone].  If you have an urgent issue when the clinic is closed that cannot wait until the next business day, you can page your doctor at the number below.    Please note that while we do our best to be available for urgent issues outside of office hours, we are not available 24/7.   If you have an urgent issue and are unable to reach us , you may choose to seek medical care at your doctor's office, retail clinic, urgent care center, or emergency room.  If you have a medical emergency, please immediately call 911 or go to the emergency department. In the event of inclement weather, please call our main line at [phone] for an update on the status of any delays  or closures.  Dermatology Medication Tips: Please keep the boxes that topical medications come in in order to help keep track of the instructions about where and how to use these. Pharmacies typically print the medication instructions only on the boxes and not directly on the medication tubes.   If your medication is too expensive, please contact our office at [phone] or send us  a message through  MyChart.   We are unable to tell what your co-pay for medications will be in advance as this is different depending on your insurance coverage. However, we may be able to find a substitute medication at lower cost or fill out paperwork to get insurance to cover a needed medication.   If a prior authorization is required to get your medication covered by your insurance company, please allow us  1-2 business days to complete this process.  Drug prices often vary depending on where the prescription is filled and some pharmacies may offer cheaper prices.  The website www.goodrx.com contains coupons for medications through different pharmacies. The prices here do not account for what the cost may be with help from insurance (it may be cheaper with your insurance), but the website can give you the price if you did not use any insurance.  - You can print the associated coupon and take it with your prescription to the pharmacy.  - You may also stop by our office during regular business hours and pick up a GoodRx coupon card.  - If you need your prescription sent electronically to a different pharmacy, notify our office through [facility] or by phone at [phone]

## 2024-08-16 NOTE — Progress Notes (Signed)
   Follow-Up Visit   Subjective  Laura Maynard is a 78 y.o. female established patient who presents for FOLLOW UP on the diagnoses listed below:  Patient was last evaluated on 10/08/23.   SebDerm: Prescribed clobetasol  0.05% solution to use daily PRN for itch. Recommended DHS Zinc shampoo. Patient reports sxs are better. Itch/Pain Score remains a 6-7/10.   AGA: Prescribed medrock minoxidil 7%/finasteride 0.01% - pt did not purchase as she did not understand Medrock process. Recommended Viviscal/Vital Protein - pt did not purchase supplements. Patient reports sxs are unchanged.    The following portions of the chart were reviewed this encounter and updated as appropriate: medications, allergies, medical history   Review of Systems:  No other skin or systemic complaints except as noted in HPI or Assessment and Plan.  Objective  Well appearing patient in no apparent distress; mood and affect are within normal limits.   A focused examination was performed of the following areas: scalp   Relevant exam findings are noted in the Assessment and Plan.             Assessment & Plan    Seborrheic dermatitis of the scalp Chronic seborrheic dermatitis with persistent itchiness, particularly at the back of the scalp. No visible flaking. Current treatment includes clobetasol  drops and DHS Zinc shampoo, but adherence to shampoo usage is suboptimal. Discussed the addition of DermaSmooth oil treatment to enhance symptom control and reduce reliance on clobetasol  drops.  - Continue clobetasol  drops as needed for itchiness. - Use DHS Zinc shampoo weekly, allowing it to sit on the scalp for a few minutes before rinsing. - Introduce DermaSmooth oil treatment, applying it a few hours before shampooing, and wash it out with DHS Zinc shampoo. - Provided a new prescription with ten refills for clobetasol  drops.  Androgenic alopecia Hair thinning and shedding likely due to hormonal changes and  genetic predisposition. Discussed the role of medical treatments in preventing further hair loss. Minoxidil and finasteride combination therapy was discussed as a more effective option compared to over-the-counter minoxidil, but she expressed concerns about the complexity and cost of treatment. Emphasized the importance of continued treatment to prevent hair loss, noting that cessation would lead to resumption of hair loss.  - Consider minoxidil and finasteride combination therapy if she decides to pursue treatment. - Encouraged use of conditioner to maintain hair hydration and prevent breakage. - Recommended vitamin D  supplementation. SEBORRHEIC DERMATITIS   Related Medications Fluocinolone Acetonide Scalp (DERMA-SMOOTHE/FS SCALP) 0.01 % OIL Apply to scalp a few hours prior to shampooing, then rinse out. Use weekly. clobetasol  (TEMOVATE ) 0.05 % external solution Apply 1 Application topically 2 (two) times daily. use one or two times a day PRN to affected areas ANDROGENIC ALOPECIA    Return in about 1 year (around 08/16/2025) for ANDROGENETIC ALOPECIA, SEB DERM.   Documentation: I have reviewed the above documentation for accuracy and completeness, and I agree with the above.  I, Shirron Maranda, CMA, am acting as scribe for [employer], DO.   Delon Lenis, DO

## 2024-08-19 ENCOUNTER — Telehealth: Payer: Self-pay

## 2024-08-19 NOTE — Telephone Encounter (Signed)
 Pt went to [facility] and stated her referral was not in.  She gave a urine culture and an ultrasound. They told her they were going to order a CT scan and some sort of scope and has not heard anything back, please advise

## 2024-08-19 NOTE — Telephone Encounter (Signed)
 Copied from CRM [id number]. Topic: Clinical - Medical Advice >> Aug 19, 2024  9:31 AM Laymon HERO wrote: Reason for CRM: [facility] -They do not answer the phone or return calls. The referral was sent there and she went on 11/14 and they did not have a referral and they have no information on her. She gave a urine culture and an ultrasound. They told her they were going to order a CT scan and some sort of scope and has not heard anything back. She is asking for Dr Sebastian to reach out.

## 2024-08-20 NOTE — Telephone Encounter (Signed)
 ATC, unable to reach patient . LVMTCB

## 2024-08-23 NOTE — Telephone Encounter (Signed)
 Contacted patient and she informed me she has already seen urology. Everything was taken care of. Nothing further needed.

## 2024-08-23 NOTE — Telephone Encounter (Signed)
 ATC, unable to reach patient . LVMTCB

## 2024-08-23 NOTE — Telephone Encounter (Signed)
 Copied from CRM #[id number]. Topic: General - Other >> Aug 23, 2024  2:45 PM Robinson H wrote: Reason for CRM: Patient returning call to office states she doesn't know why office is calling her she already has an appointment with Urologist  Erminio [phone]

## 2024-08-31 ENCOUNTER — Other Ambulatory Visit: Payer: Self-pay | Admitting: Family Medicine

## 2024-08-31 DIAGNOSIS — Z0000 Encounter for general adult medical examination without abnormal findings: Secondary | ICD-10-CM

## 2024-08-31 DIAGNOSIS — G44219 Episodic tension-type headache, not intractable: Secondary | ICD-10-CM

## 2024-09-02 DIAGNOSIS — R3121 Asymptomatic microscopic hematuria: Secondary | ICD-10-CM | POA: Diagnosis not present

## 2024-09-07 ENCOUNTER — Ambulatory Visit: Admitting: Family Medicine

## 2024-09-20 ENCOUNTER — Telehealth: Payer: Self-pay | Admitting: Internal Medicine

## 2024-09-20 NOTE — Telephone Encounter (Signed)
 Patient dropped off tax return documents for patient assistance with regard to her Cholesterol shots.  I will place these documents in Hilty's box for review.  I will also make a new appointment so she can see Hilty in the future.

## 2024-09-20 NOTE — Telephone Encounter (Signed)
 Paperwork forwarded to [employer] team for Leqvio  PAP

## 2024-09-21 ENCOUNTER — Ambulatory Visit: Payer: Self-pay | Admitting: Internal Medicine

## 2024-09-21 LAB — NMR, LIPOPROFILE
Cholesterol, Total: 259 mg/dL — ABNORMAL HIGH (ref 100–199)
HDL Particle Number: 38.4 umol/L
HDL-C: 111 mg/dL
LDL Particle Number: 905 nmol/L
LDL Size: 21.8 nm
LDL-C (NIH Calc): 138 mg/dL — ABNORMAL HIGH (ref 0–99)
LP-IR Score: <25
Small LDL Particle Number: <90 nmol/L
Triglycerides: 65 mg/dL (ref 0–149)

## 2024-10-04 ENCOUNTER — Ambulatory Visit: Admitting: Internal Medicine

## 2024-10-04 VITALS — BP 137/83 | HR 88 | Ht 66.0 in | Wt 165.0 lb

## 2024-10-04 DIAGNOSIS — R931 Abnormal findings on diagnostic imaging of heart and coronary circulation: Secondary | ICD-10-CM | POA: Diagnosis not present

## 2024-10-04 DIAGNOSIS — M791 Myalgia, unspecified site: Secondary | ICD-10-CM

## 2024-10-04 DIAGNOSIS — E785 Hyperlipidemia, unspecified: Secondary | ICD-10-CM

## 2024-10-04 DIAGNOSIS — E7841 Elevated Lipoprotein(a): Secondary | ICD-10-CM

## 2024-10-04 DIAGNOSIS — T466X5D Adverse effect of antihyperlipidemic and antiarteriosclerotic drugs, subsequent encounter: Secondary | ICD-10-CM | POA: Diagnosis not present

## 2024-10-04 DIAGNOSIS — T466X5A Adverse effect of antihyperlipidemic and antiarteriosclerotic drugs, initial encounter: Secondary | ICD-10-CM

## 2024-10-04 NOTE — Patient Instructions (Signed)
 Medication Instructions:  NO CHANGES  *If you need a refill on your cardiac medications before your next appointment, please call your pharmacy*   Follow-Up: At [facility], you and your health needs are our priority.  As part of our continuing mission to provide you with exceptional heart care, our providers are all part of one team.  This team includes your primary Cardiologist (physician) and Advanced Practice Providers or APPs (Physician Assistants and Nurse Practitioners) who all work together to provide you with the care you need, when you need it.  Your next appointment:    12 months with Dr. Maximo Spar  We recommend signing up for the patient portal called "MyChart".  Sign up information is provided on this After Visit Summary.  MyChart is used to connect with patients for Virtual Visits (Telemedicine).  Patients are able to view lab/test results, encounter notes, upcoming appointments, etc.  Non-urgent messages can be sent to your provider as well.   To learn more about what you can do with MyChart, go to [email or web address].

## 2024-10-04 NOTE — Progress Notes (Signed)
 "   LIPID CLINIC CONSULT NOTE  Chief Complaint:  Follow-up dyslipidemia  Primary Care Physician: Sebastian Beverley NOVAK, MD   Primary Cardiologist:  Vinie JAYSON Maxcy, MD  HPI:  Laura Maynard is a 79 y.o. female who is being seen today for the evaluation of dyslipidemia at the request of Sebastian Beverley NOVAK, MD. This is a pleasant 79 year old female who is kindly referred by Dr. Tonita for evaluation of dyslipidemia.  According to her she has had a longstanding history of dyslipidemia and is been on multiple therapies including the statins (pravastatin, atorvastatin (and ezetimibe, for which she has had significant side effects including myalgias.  Recently she had a lipid profile which showed total cholesterol 269, triglycerides 57, HDL 99 and LDL 155.  I reviewed her chart and noted that she has no history of coronary disease and few cardiovascular risk factors other than age.  She has no history of hypertension, diabetes and no significant premature coronary disease in her family.  She does have hypothyroidism on low-dose levothyroxine .  Chart review indicated she had a CT angios in 2014 which showed no evidence of pulmonary embolism or pulmonary sarcoidosis.  Also there was no mention of coronary calcification.  08/31/2020  Laura Maynard is seen today in follow-up.  Repeat lipids show persistent elevation with total cholesterol 269, triglycerides 102, HDL 53 and LDL 152.  She has not been on therapy for this.  A CT calcium  score did show some coronary calcium  with a score 43, 64th percentile for age and sex matched control.  Small area of calcification noted in the distal circumflex artery.  On these findings I would recommend therapy.  I had recommended trying bempedoic acid  however the cost would have been $68 a month.  This is felt to be too expensive for her.  We then talked about several other options.  One might be a very low-dose of a statin such as rosuvastatin  which is very potent.  She seemed  agreeable to this.  12/07/2020  Laura Maynard is seen today in follow-up.  She has been on low-dose Crestor  5 mg every other day however has significant arthralgias/myalgias which are likely related to the rosuvastatin .  She had similar symptoms with pravastatin and ezetimibe in the past.  I do think we will need to consider an alternative.  In the past we had prescribed bempedoic acid  and she was approved for that however the cost was an issue.  I would like to revisit that and she may now be eligible for a grant from [employer].  Her recent cholesterol showed total 247, triglycerides 50, HDL 118 and LDL 121.  06/12/2021  Laura Maynard is seen today in follow-up.  She reports she has been compliant with the Repatha  although her labs 2 weeks ago showed no significant change.  Her total cholesterol still 240 with LDL 121.  This is quite unusual.  She did describe to me how she injected the medication which sounds correct.  There are few explanations as to why she may not have had improvement in this.  One may be that she has a high proportion of LP(a), the other could be a mutation in PCSK9 however that would be unusual given the fact that her cholesterol is really not all that high however does need to be lower based on the fact she has coronary calcium .  We discussed several other options today.  I propose going to atorvastatin however she was deathly afraid of that based on  her reading on the Internet.  She said she would prefer to go back to low-dose rosuvastatin  even though it may have caused her side effects.  She is not really prepared to do injections every 2 weeks indefinitely as she thought the medication would only be necessary for short period of time.  10/03/2021  Laura Maynard returns today for follow-up.  Unfortunately her lipids were not significantly improved from prior studies.  Cholesterol now total is 232, triglycerides 67, HDL 104 and LDL 117.  Her labs from August showed total cholesterol 240,  triglycerides 50, HDL 111 and LDL 121.  She reports compliance with rosuvastatin  5 mg every other day and is not having any side effects.  02/15/2022  Laura Maynard returns today for follow-up.  She reports strict compliance with the increased dose of rosuvastatin  20 mg daily.  Unfortunately there is been no significant change in her lipids.  She is quite frustrated in fact was tearful about this today.  Interestingly she had almost no significant improvement with other statins in the past and was also not able to tolerate Repatha  although did have some improvement in her lipids.  Labs now show total cholesterol 241, triglycerides 54, HDL 114 and LDL 118, similar to prior testing which showed total cholesterol 232 and LDL 117.  07/19/2022  Laura Maynard returns today for follow-up of her lipids.  She recently was seen for her second injection of Leqvio .  This was postponed due to concern about hypertension.  She had initially seen her primary care provider because of some leg swelling and was noted to be a little hypertensive.  They had indicated that they felt it might be related to Leqvio  and then she was seen by our pharmacist who decided to postpone her shot and have her follow-up with me.  Blood pressure today however is normal.  When I asked her about her diet she says she has been eating more processed foods and sodium recently which I suspect may be related to her elevated blood pressure.  It is highly unlikely to be related to her Leqvio .  03/21/2023  Laura Maynard is seen today in follow-up.  She states to be tolerating Leqvio .  She has had further improvement in her lipids and only mild injection site redness in her abdomen however tolerated the arm injection better.  She has had a marked reduction in her LDL particle number for 1193 down to 843, LDL-C has reduced from 144 down to 105 and moreover her LP(a) is come down from 411.9 nmol/L to 259 nmol/L.  09/19/2023  Laura Maynard is seen today in  follow-up.  She remains on Leqvio .  She has had very good control of her LDL particles.  LDL particle number was 801, her LDL was going up somewhat to 130 however this is calculated as her HDL is actually higher at 889 and her triglycerides are very low at 43.  Her small LDL particle numbers are still undetectable.  Overall I think this is mathematically demonstrating an increase in LDL however her cholesterol is still very well-controlled.  10/04/2024  Laura Maynard returns today for follow-up.  She is on the Leqvio  and had a good response in her lipids.  Recent testing showed a mild improvement in her LDL particle number at 905 with a elevated LDL of 138, HDL 111, triglycerides 65 and small LDL particle number less than 90.  So far she is tolerating Leqvio  well.  Will fill out some paperwork to continue to get  financial aid from the company.  Unfortunately, she cannot tolerate statins or ezetimibe.  Her LP(a) remained above 200, despite a marked improvement from over 400.  Based on this with prior known cardiovascular disease and no coronary events, I think that she could be a candidate for the ocean a preventive trial.  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   Essential hypertension 02/19/2024   High cholesterol    Hypertension    Hypothyroidism    Labile hypertension    Migraine    Prediabetes    Sarcoidosis, lung 15 YRS AGO   NO TREATMENTS   Toxic multinodular goiter 05/27/2014   Vertigo    Vitamin D  deficiency     Past Surgical History:  Procedure Laterality Date   BACK SURGERY  1970   CATARACT EXTRACTION Left 12/2017   COLONOSCOPY  2006; 03/28/11   2 small adenomas; normal   EYE SURGERY  1964   right eye   KNEE ARTHROSCOPY  2002   NASAL SEPTUM SURGERY  1998   TOTAL ABDOMINAL HYSTERECTOMY  2000    FAMHx:  Family History  Problem Relation Age of Onset   Lung cancer Sister 52   Throat cancer Brother  17   Stroke Mother    Emphysema Father    COPD Sister    Diabetes Brother    Colon cancer Neg Hx     SOCHx:   reports that she has never smoked. She has never used smokeless tobacco. She reports that she does not drink alcohol and does not use drugs.  ALLERGIES:  Allergies  Allergen Reactions   Penicillins Hives   Sulfa Antibiotics Hives   Zetia [Ezetimibe]     Myalgia   Doxycycline  Itching    itching   Gabapentin  Nausea And Vomiting   Pravastatin Other (See Comments)    Myalgia   Rosuvastatin  Other (See Comments)    Joint/muscle aches, fatigue    ROS: Pertinent items noted in HPI and remainder of comprehensive ROS otherwise negative.  HOME MEDS: Current Outpatient Medications on File Prior to Visit  Medication Sig Dispense Refill   Acetaminophen 325 MG CAPS Take by mouth.     amitriptyline  (ELAVIL ) 25 MG tablet Take 1 tablet (25 mg total) by mouth at bedtime. 30 tablet 3   AYR SALINE NASAL NA Place into the nose.     butalbital -acetaminophen-caffeine  (FIORICET) 50-325-40 MG tablet TAKE 1 TABLET BY MOUTH EVERY 4 HOURS AS NEEDED FOR SEVERE HEADACHE 30 tablet 1   cholecalciferol (VITAMIN D3) 25 MCG (1000 UNIT) tablet Take 1 tablet (1,000 Units total) by mouth 2 (two) times daily with a meal.     clobetasol  (TEMOVATE ) 0.05 % external solution Apply 1 Application topically 2 (two) times daily. use one or two times a day PRN to affected areas 50 mL 11   Cyanocobalamin  (VITAMIN B 12 PO) Take by mouth.     cycloSPORINE (RESTASIS) 0.05 % ophthalmic emulsion Apply to eye.     Fluocinolone  Acetonide Scalp (DERMA-SMOOTHE /FS SCALP) 0.01 % OIL Apply to scalp a few hours prior to shampooing, then rinse out. Use weekly. 118 mL 11   inclisiran (LEQVIO ) 284 MG/1.5ML SOSY injection Inject 284mg  SQ at month 0 and month 3, then every 6 months 1.5 mL 2   levothyroxine  (SYNTHROID ) 50 MCG tablet Take 1.5 tablets (75 mcg total) by mouth daily before breakfast. 150 tablet 3   meclizine  (ANTIVERT )  25 MG tablet Take      1 tablet      3 x day       if needed for Dizziness /Vertigo 100 tablet 0   Naproxen Sodium (ALEVE PO) Take by mouth. PRN     ondansetron  (ZOFRAN -ODT) 4 MG disintegrating tablet Take 1 tablet (4 mg total) by mouth every 8 (eight) hours as needed for nausea or vomiting. 20 tablet 0   SUMAtriptan  (IMITREX ) 25 MG tablet Take 1 tablet (25 mg total) by mouth as needed for migraine. 10 tablet 2   Evolocumab  (REPATHA  SURECLICK) 140 MG/ML SOAJ      fluticasone  (FLONASE ) 50 MCG/ACT nasal spray SHAKE LIQUID AND USE 1 TO 2 SPRAYS IN EACH NOSTRIL TWICE DAILY AS NEEDED FOR ALLERGIES     HYDROcodone-acetaminophen (NORCO/VICODIN) 5-325 MG tablet Take 1 tablet by mouth every 6 (six) hours as needed.     LORazepam  (ATIVAN ) 0.5 MG tablet Take 0.5 mg by mouth 4 (four) times daily as needed.     NEOMYCIN -POLYMYXIN-HYDROCORTISONE  (CORTISPORIN) 1 % SOLN OTIC solution Apply  6 to 8 drops to affected ear 3 to 4 x /day 10 mL 3   SUMAtriptan  (IMITREX ) 100 MG tablet May repeat in 2 hours if headache persists or recurs. Max: 2 pills a day 10 tablet 1   No current facility-administered medications on file prior to visit.    LABS/IMAGING: No results found for this or any previous visit (from the past 48 hours).  No results found.  LIPID PANEL:    Component Value Date/Time   CHOL 259 (H) 02/19/2024 0930   CHOL 241 (H) 02/06/2022 0901   TRIG 62.0 02/19/2024 0930   HDL 117.10 02/19/2024 0930   HDL 114 02/06/2022 0901   CHOLHDL 2 02/19/2024 0930   VLDL 12.4 02/19/2024 0930   LDLCALC 130 (H) 02/19/2024 0930   LDLCALC 107 (H) 04/28/2023 1107    WEIGHTS: Wt Readings from Last 3 Encounters:  10/04/24 165 lb (74.8 kg)  08/06/24 162 lb (73.5 kg)  07/22/24 164 lb 9.6 oz (74.7 kg)    VITALS: BP 137/83 (BP Location: Left Arm)   Pulse 88   Ht 5' 6 (1.676 m)   Wt 165 lb (74.8 kg)   SpO2 98%   BMI 26.63 kg/m   EXAM: Deferred  EKG: Deferred  ASSESSMENT: Mixed dyslipidemia, high LDL  and HDL, probable genetic dyslipidemia CAC score of 43, 64th percentile (06/2019) Statin and ezetimibe intolerance - myalgias Suboptimal lipid lowering with Repatha  Elevated LP(a) of 415 nmol/L, reduced to to 259.9 on Leqvio .  PLAN: 1.   Laura Maynard has persistently elevated LP(a) and elevated LDL cholesterol with reasonably good particle numbers.  Unfortunately she could not tolerate statins or ezetimibe.  Additional therapy options are limited possibly to Nexletol .  I think however she may benefit more from LP(a) lowering and is a good candidate likely for the ocean a preventive trial.  Will forward on her information as we are currently enrolling for this study and she expressed some interest in it.  Plan follow-up with me annually or sooner as necessary.  Vinie KYM Maxcy, MD, [facility], FNLA, FACP  [place]  [facility]  Medical Director of the [facility]  [facility] Diplomate of the American Board of Clinical Lipidology Attending Cardiologist  Direct Dial: [phone]  Fax: [phone]  Website:  [email or web address] Vinie BROCKS Shae Augello 10/04/2024, 10:02 AM "

## 2024-10-06 ENCOUNTER — Ambulatory Visit: Admitting: Family Medicine

## 2024-10-06 VITALS — BP 123/72 | HR 77 | Temp 98.3°F | Ht 67.2 in | Wt 164.2 lb

## 2024-10-06 DIAGNOSIS — H9313 Tinnitus, bilateral: Secondary | ICD-10-CM | POA: Insufficient documentation

## 2024-10-06 DIAGNOSIS — G43009 Migraine without aura, not intractable, without status migrainosus: Secondary | ICD-10-CM

## 2024-10-06 DIAGNOSIS — E785 Hyperlipidemia, unspecified: Secondary | ICD-10-CM

## 2024-10-06 NOTE — Progress Notes (Signed)
 " Assessment & Plan   Assessment/Plan:  Total time spent caring for the patient today was 31 minutes. This includes time spent before the visit reviewing the chart, time spent during the visit, and time spent after the visit on documentation, etc.   Problem List Items Addressed This Visit   None       Assessment and Plan Assessment & Plan Mixed hyperlipidemia with statin and ezetimibe intolerance Mixed hyperlipidemia with high LDL and HDL, likely genetic. Coronary artery calcium  score of 43, 60th percentile by age. Statin and ezetimibe intolerant due to myalgias. Suboptimal lipid lowering with Repatha . Persistently elevated LPA of 415, reduced with Leqvio  but still high. Cardiologist recommended enrollment in [facility] trial for possible lipoprotein A lowering agents. Discussed the importance of managing cholesterol to protect heart health, emphasizing that cholesterol management is crucial to prevent heart attacks. Explained that LPA is a type of bad cholesterol and that the goal is to protect heart health rather than focusing solely on cholesterol numbers. - Enroll in [facility] trial for lipoprotein A lowering agents. - Provided printed lab results and cardiologist's report.  Migraine with associated tinnitus Migraines previously treated with Fioricet, discontinued due to barbiturate content and rebound headaches. Recommended Imitrex  for acute management. Recently prescribed amitriptyline  25 mg QHS for prophylaxis and tinnitus management, but she has not started it due to concerns about medication burden and side effects. Discussed the role of amitriptyline  in modulating brain chemicals to reduce headache risk and severity, and its potential benefit for tinnitus. Explained that sumatriptan  100 mg is safe for severe migraines and should be taken at the onset of symptoms. Discussed the option of halving the amitriptyline  dose if needed. Emphasized the importance of managing migraines to prevent  emergency department visits. - Start sumatriptan  100 mg at onset of migraine symptoms. - Consider halving amitriptyline  dose if starting. - Follow up in one month to assess response to amitriptyline .        There are no discontinued medications.  Return in about 1 month (around 11/06/2024).        Subjective:   Encounter date: 10/06/2024  Laura Maynard is a 79 y.o. female who has History of prediabetes; Vitamin D  deficiency; Hyperlipidemia LDL goal <70; Poor compliance with medication; Benign paroxysmal positional vertigo; TMJ (temporomandibular joint syndrome); Postablative hypothyroidism; BMI 27.0-27.9,adult; Medication Phobia; Multinodular goiter; FH: hypertension; Abnormal glucose; Statin myopathy; Migraine without aura and without status migrainosus, not intractable; Insomnia; Right sided sciatica; Sialectasis; Seasonal allergic conjunctivitis; Pseudophakia of left eye; Dry eye syndrome, bilateral; Cataract, nuclear sclerotic, left eye; Cataract cortical, senile, left; Blood in urine; Aphakia, right eye; Osteoarthritis of knees, bilateral; Change in bowel habits; Seborrheic dermatitis of scalp; Alopecia; Blindness, one eye, low vision other eye, unspecified eyes; Gastrointestinal complaint; B12 deficiency; Vertigo; Episodic tension-type headache, not intractable; and Arthralgia of left knee on their problem list..   She  has a past medical history of Blind right eye, Chorioretinal scar, macular (03/10/2013), Essential hypertension (02/19/2024), High cholesterol, Hypertension, Hypothyroidism, Labile hypertension, Migraine, Prediabetes, Sarcoidosis, lung (15 YRS AGO), Toxic multinodular goiter (05/27/2014), Vertigo, and Vitamin D  deficiency..   She presents with chief complaint of Follow-up (Pt states that she is not sure the reason for today's visit. Wants to know about lab results from cardiology visit.) .   Discussed the use of AI scribe software for clinical note transcription  with the patient, who gave verbal consent to proceed.  History of Present Illness [name] is a 79 year old female  with mixed dyslipidemia and migraines who presents for follow-up on hyperlipidemia management and migraine treatment.  Hyperlipidemia and lipid management - Mixed dyslipidemia with elevated LDL and HDL levels. - Coronary artery calcium  score of 43 in September 2020 (60th percentile for age). - Intolerant to statins and ezetimibe due to myalgias. - Suboptimal lipid lowering response to Repatha . - Lp(a) remains elevated at 415, with some reduction on Leqvio , but overall cholesterol levels remain high despite treatment.  Migraine headaches - Long-standing history of migraines, typically occurring once or twice per year. - Migraines often severe enough to require emergency care. - Last severe headache on July 24, 2024, required emergency department visit. - No severe headaches since July 24, 2024. - Previously managed with Fioricet, discontinued due to barbiturate content and risk of rebound headaches. - Currently prescribed sumatriptan  100 mg (previously used 25 mg), with concern about higher dose. - Prescribed amitriptyline  25 mg QHS, but has not initiated therapy. - Nausea during migraine episodes; prescribed antiemetic, but finds it ineffective during attacks.  Tinnitus - Constant tinnitus since last severe headache on July 24, 2024.     ROS  Past Surgical History:  Procedure Laterality Date   BACK SURGERY  1970   CATARACT EXTRACTION Left 12/2017   COLONOSCOPY  2006; 03/28/11   2 small adenomas; normal   EYE SURGERY  1964   right eye   KNEE ARTHROSCOPY  2002   NASAL SEPTUM SURGERY  1998   TOTAL ABDOMINAL HYSTERECTOMY  2000    Outpatient Medications Prior to Visit  Medication Sig Dispense Refill   Acetaminophen 325 MG CAPS Take by mouth.     amitriptyline  (ELAVIL ) 25 MG tablet Take 1 tablet (25 mg total) by mouth at bedtime. 30 tablet 3   AYR  SALINE NASAL NA Place into the nose.     butalbital -acetaminophen-caffeine  (FIORICET) 50-325-40 MG tablet TAKE 1 TABLET BY MOUTH EVERY 4 HOURS AS NEEDED FOR SEVERE HEADACHE 30 tablet 1   cholecalciferol (VITAMIN D3) 25 MCG (1000 UNIT) tablet Take 1 tablet (1,000 Units total) by mouth 2 (two) times daily with a meal.     clobetasol  (TEMOVATE ) 0.05 % external solution Apply 1 Application topically 2 (two) times daily. use one or two times a day PRN to affected areas 50 mL 11   Cyanocobalamin  (VITAMIN B 12 PO) Take by mouth.     cycloSPORINE (RESTASIS) 0.05 % ophthalmic emulsion Apply to eye.     Evolocumab  (REPATHA  SURECLICK) 140 MG/ML SOAJ      Fluocinolone  Acetonide Scalp (DERMA-SMOOTHE /FS SCALP) 0.01 % OIL Apply to scalp a few hours prior to shampooing, then rinse out. Use weekly. 118 mL 11   inclisiran (LEQVIO ) 284 MG/1.5ML SOSY injection Inject 284mg  SQ at month 0 and month 3, then every 6 months 1.5 mL 2   levothyroxine  (SYNTHROID ) 50 MCG tablet Take 1.5 tablets (75 mcg total) by mouth daily before breakfast. 150 tablet 3   meclizine  (ANTIVERT ) 25 MG tablet Take      1 tablet      3 x day       if needed for Dizziness /Vertigo 100 tablet 0   Naproxen Sodium (ALEVE PO) Take by mouth. PRN     NEOMYCIN -POLYMYXIN-HYDROCORTISONE  (CORTISPORIN) 1 % SOLN OTIC solution Apply  6 to 8 drops to affected ear 3 to 4 x /day 10 mL 3   ondansetron  (ZOFRAN -ODT) 4 MG disintegrating tablet Take 1 tablet (4 mg total) by mouth every 8 (eight) hours as needed for nausea  or vomiting. 20 tablet 0   SUMAtriptan  (IMITREX ) 100 MG tablet May repeat in 2 hours if headache persists or recurs. Max: 2 pills a day 10 tablet 1   SUMAtriptan  (IMITREX ) 25 MG tablet Take 1 tablet (25 mg total) by mouth as needed for migraine. 10 tablet 2   fluticasone  (FLONASE ) 50 MCG/ACT nasal spray SHAKE LIQUID AND USE 1 TO 2 SPRAYS IN EACH NOSTRIL TWICE DAILY AS NEEDED FOR ALLERGIES (Patient not taking: Reported on 10/06/2024)      HYDROcodone-acetaminophen (NORCO/VICODIN) 5-325 MG tablet Take 1 tablet by mouth every 6 (six) hours as needed. (Patient not taking: Reported on 10/06/2024)     LORazepam  (ATIVAN ) 0.5 MG tablet Take 0.5 mg by mouth 4 (four) times daily as needed. (Patient not taking: Reported on 10/06/2024)     No facility-administered medications prior to visit.    Family History  Problem Relation Age of Onset   Lung cancer Sister 4   Throat cancer Brother 4   Stroke Mother    Emphysema Father    COPD Sister    Diabetes Brother    Colon cancer Neg Hx     Social History   Socioeconomic History   Marital status: Divorced    Spouse name: Not on file   Number of children: 0   Years of education: college   Highest education level: Not on file  Occupational History    Comment: Retired  Tobacco Use   Smoking status: Never   Smokeless tobacco: Never  Vaping Use   Vaping status: Never Used  Substance and Sexual Activity   Alcohol use: Never    Alcohol/week: 1.0 standard drink of alcohol    Types: 1 Standard drinks or equivalent per week    Comment: RARE   Drug use: Never   Sexual activity: Not Currently  Other Topics Concern   Not on file  Social History Narrative   Patient is retired and lives at home alone. Patient  has college education.   Caffeine - 3 cups of caffeine  daily.  One soda daily.   Right handed.   Social Drivers of Health   Tobacco Use: Low Risk (08/16/2024)   Patient History    Smoking Tobacco Use: Never    Smokeless Tobacco Use: Never    Passive Exposure: Not on file  Financial Resource Strain: Low Risk (06/25/2024)   Overall Financial Resource Strain (CARDIA)    Difficulty of Paying Living Expenses: Not hard at all  Food Insecurity: No Food Insecurity (06/25/2024)   Epic    Worried About Radiation Protection Practitioner of Food in the Last Year: Never true    Ran Out of Food in the Last Year: Never true  Transportation Needs: No Transportation Needs (06/25/2024)   Epic    Lack of  Transportation (Medical): No    Lack of Transportation (Non-Medical): No  Physical Activity: Inactive (06/25/2024)   Exercise Vital Sign    Days of Exercise per Week: 0 days    Minutes of Exercise per Session: 0 min  Stress: No Stress Concern Present (06/25/2024)   [employer] of Occupational Health - Occupational Stress Questionnaire    Feeling of Stress: Only a little  Social Connections: Moderately Isolated (06/25/2024)   Social Connection and Isolation Panel    Frequency of Communication with Friends and Family: More than three times a week    Frequency of Social Gatherings with Friends and Family: Twice a week    Attends Religious Services: More than 4 times per year  Active Member of Clubs or Organizations: No    Attends Banker Meetings: Never    Marital Status: Divorced  Catering Manager Violence: Not At Risk (06/25/2024)   Epic    Fear of Current or Ex-Partner: No    Emotionally Abused: No    Physically Abused: No    Sexually Abused: No  Depression (PHQ2-9): Low Risk (10/06/2024)   Depression (PHQ2-9)    PHQ-2 Score: 0  Alcohol Screen: Low Risk (06/25/2024)   Alcohol Screen    Last Alcohol Screening Score (AUDIT): 0  Housing: Unknown (06/25/2024)   Epic    Unable to Pay for Housing in the Last Year: No    Number of Times Moved in the Last Year: Not on file    Homeless in the Last Year: No  Utilities: Not At Risk (06/25/2024)   Epic    Threatened with loss of utilities: No  Health Literacy: Adequate Health Literacy (06/25/2024)   B1300 Health Literacy    Frequency of need for help with medical instructions: Never                                                                                                  Objective:  Physical Exam: BP 123/72 (BP Location: Left Arm, Patient Position: Sitting, Cuff Size: Normal)   Pulse 77   Temp 98.3 F (36.8 C)   Ht 5' 7.2 (1.707 m)   Wt 164 lb 3.2 oz (74.5 kg)   SpO2 99%   BMI 25.56 kg/m    Physical  Exam GENERAL: Alert, cooperative, well developed, no acute distress HEENT: Normocephalic, normal oropharynx, moist mucous membranes CHEST: Clear to auscultation bilaterally, no wheezes, rhonchi, or crackles CARDIOVASCULAR: Normal heart rate and rhythm, S1 and S2 normal without murmurs ABDOMEN: Soft, non-tender, non-distended, without organomegaly, normal bowel sounds EXTREMITIES: No cyanosis or edema NEUROLOGICAL: Cranial nerves grossly intact, moves all extremities without gross motor or sensory deficit   Physical Exam  CT Head Wo Contrast Result Date: 07/27/2024 EXAM: CT HEAD WITHOUT CONTRAST 07/27/2024 01:45:59 PM TECHNIQUE: CT of the head was performed without the administration of intravenous contrast. Automated exposure control, iterative reconstruction, and/or weight based adjustment of the mA/kV was utilized to reduce the radiation dose to as low as reasonably achievable. COMPARISON: Brain MRI 05/17/2024. Head CT 11/07/2015. CLINICAL HISTORY: 79 year old female. Sudden, severe headache with nonstop vomiting and shortness of breath. Denies fevers. FINDINGS: BRAIN AND VENTRICLES: No acute hemorrhage. No evidence of acute infarct. No hydrocephalus. No extra-axial collection. No mass effect or midline shift. Mild for age skull base calcified atherosclerosis. No suspicious intracranial vascular hyperdensity. ORBITS: No acute abnormality. SINUSES: No abnormality. SOFT TISSUES AND SKULL: No acute soft tissue abnormality. No skull fracture. IMPRESSION: 1. Normal for age non-contrast head CT Electronically signed by: Helayne Hurst MD 07/27/2024 01:59 PM EDT RP Workstation: [id number]   DG Chest Portable 1 View Result Date: 07/27/2024 CLINICAL DATA:  Shortness of breath after vomiting. History of hypertension. EXAM: PORTABLE CHEST 1 VIEW COMPARISON:  Radiographs 06/15/2018 and 01/31/2011. Cardiac CT 06/22/2019. FINDINGS: 1228 hours. Low lung volumes with  mild atelectasis at both lung bases. There is no  confluent airspace disease, pneumothorax or significant pleural effusion. The heart size and mediastinal contours are stable with mild aortic atherosclerosis. The bones appear unremarkable. Telemetry leads overlie the chest. IMPRESSION: Low lung volumes with mild bibasilar atelectasis. No evidence of pneumonia or edema. Electronically Signed   By: Elsie Perone M.D.   On: 07/27/2024 13:08    Recent Results (from the past 2160 hours)  CBC with Differential     Status: None   Collection Time: 07/27/24 12:18 PM  Result Value Ref Range   WBC 5.4 4.0 - 10.5 K/uL   RBC 4.44 3.87 - 5.11 MIL/uL   Hemoglobin 12.7 12.0 - 15.0 g/dL   HCT 62.6 63.9 - 53.9 %   MCV 84.0 80.0 - 100.0 fL   MCH 28.6 26.0 - 34.0 pg   MCHC 34.0 30.0 - 36.0 g/dL   RDW 85.7 88.4 - 84.4 %   Platelets 377 150 - 400 K/uL   nRBC 0.0 0.0 - 0.2 %   Neutrophils Relative % 77 %   Neutro Abs 4.1 1.7 - 7.7 K/uL   Lymphocytes Relative 15 %   Lymphs Abs 0.8 0.7 - 4.0 K/uL   Monocytes Relative 7 %   Monocytes Absolute 0.4 0.1 - 1.0 K/uL   Eosinophils Relative 0 %   Eosinophils Absolute 0.0 0.0 - 0.5 K/uL   Basophils Relative 1 %   Basophils Absolute 0.0 0.0 - 0.1 K/uL   Immature Granulocytes 0 %   Abs Immature Granulocytes 0.01 0.00 - 0.07 K/uL    Comment: Performed at [facility], [address] [place], [place] [address]  Comprehensive metabolic panel     Status: Abnormal   Collection Time: 07/27/24 12:18 PM  Result Value Ref Range   Sodium 142 135 - 145 mmol/L   Potassium 3.8 3.5 - 5.1 mmol/L   Chloride 108 98 - 111 mmol/L   CO2 23 22 - 32 mmol/L   Glucose, Bld 103 (H) 70 - 99 mg/dL    Comment: Glucose reference range applies only to samples taken after fasting for at least 8 hours.   BUN 8 8 - 23 mg/dL   Creatinine, Ser 9.37 0.44 - 1.00 mg/dL   Calcium  9.9 8.9 - 10.3 mg/dL   Total Protein 7.0 6.5 - 8.1 g/dL   Albumin 4.3 3.5 - 5.0 g/dL   AST 25 15 - 41 U/L   ALT 13 0 - 44 U/L   Alkaline  Phosphatase 130 (H) 38 - 126 U/L   Total Bilirubin 0.5 0.0 - 1.2 mg/dL   GFR, Estimated >39 >39 mL/min    Comment: (NOTE) Calculated using the CKD-EPI Creatinine Equation (2021)    Anion gap 11 5 - 15    Comment: Performed at [facility], [address], [place], [place] [address]  Troponin T, High Sensitivity     Status: None   Collection Time: 07/27/24 12:18 PM  Result Value Ref Range   Troponin T High Sensitivity <15 0 - 19 ng/L    Comment: (NOTE) Biotin concentrations > 1000 ng/mL falsely decrease TnT results.  Serial cardiac troponin measurements are suggested.  Refer to the Links section for chest pain algorithms and additional  guidance. Performed at [facility], [address], [place], [place] [address]   Pro Brain natriuretic peptide     Status: None   Collection Time: 07/27/24 12:18 PM  Result Value Ref Range   Pro  Brain Natriuretic Peptide 142.0 <300.0 pg/mL    Comment: (NOTE) Age Group        Cut-Points    Interpretation  < 50 years     450 pg/mL       NT-proBNP > 450 pg/mL indicates                                ADHF is likely              50 to 75 years  900 pg/mL      NT-proBNP > 900 pg/mL indicates          ADHF is likely  > 75 years      1800 pg/mL     NT-proBNP > 1800 pg/mL indicates          ADHF is likely                           All ages    Results between       Indeterminate. Further clinical             300 and the cut-   information is needed to determine            point for age group   if ADHF is present.                                                             Elecsys proBNP II/ Elecsys proBNP II STAT           Cut-Point                       Interpretation  300 pg/mL                    NT-proBNP <300pg/mL indicates                             ADHF is not likely  Performed at [facility], [address] [place], [place] [address]   Resp panel by RT-PCR (RSV, Flu A&B,  Covid) Anterior Nasal Swab     Status: None   Collection Time: 07/27/24 12:18 PM   Specimen: Anterior Nasal Swab  Result Value Ref Range   SARS Coronavirus 2 by RT PCR NEGATIVE NEGATIVE    Comment: (NOTE) SARS-CoV-2 target nucleic acids are NOT DETECTED.  The SARS-CoV-2 RNA is generally detectable in upper respiratory specimens during the acute phase of infection. The lowest concentration of SARS-CoV-2 viral copies this assay can detect is 138 copies/mL. A negative result does not preclude SARS-Cov-2 infection and should not be used as the sole basis for treatment or other patient management decisions. A negative result may occur with  improper specimen collection/handling, submission of specimen other than nasopharyngeal swab, presence of viral mutation(s) within the areas targeted by this assay, and inadequate number of viral copies(<138 copies/mL). A negative result must be combined with clinical observations, patient history, and epidemiological information. The expected result is Negative.  Fact Sheet for Patients:  bloggercourse.com  Fact Sheet for Healthcare Providers:  seriousbroker.it  This test is no  t yet approved or cleared by the [place]  FDA and  has been authorized for detection and/or diagnosis of SARS-CoV-2 by FDA under an Emergency Use Authorization (EUA). This EUA will remain  in effect (meaning this test can be used) for the duration of the COVID-19 declaration under Section 564(b)(1) of the Act, 21 U.S.C.section 360bbb-3(b)(1), unless the authorization is terminated  or revoked sooner.       Influenza A by PCR NEGATIVE NEGATIVE   Influenza B by PCR NEGATIVE NEGATIVE    Comment: (NOTE) The Xpert Xpress SARS-CoV-2/FLU/RSV plus assay is intended as an aid in the diagnosis of influenza from Nasopharyngeal swab specimens and should not be used as a sole basis for treatment. Nasal washings and aspirates  are unacceptable for Xpert Xpress SARS-CoV-2/FLU/RSV testing.  Fact Sheet for Patients: bloggercourse.com  Fact Sheet for Healthcare Providers: seriousbroker.it  This test is not yet approved or cleared by the [place]  FDA and has been authorized for detection and/or diagnosis of SARS-CoV-2 by FDA under an Emergency Use Authorization (EUA). This EUA will remain in effect (meaning this test can be used) for the duration of the COVID-19 declaration under Section 564(b)(1) of the Act, 21 U.S.C. section 360bbb-3(b)(1), unless the authorization is terminated or revoked.     Resp Syncytial Virus by PCR NEGATIVE NEGATIVE    Comment: (NOTE) Fact Sheet for Patients: bloggercourse.com  Fact Sheet for Healthcare Providers: seriousbroker.it  This test is not yet approved or cleared by the [place]  FDA and has been authorized for detection and/or diagnosis of SARS-CoV-2 by FDA under an Emergency Use Authorization (EUA). This EUA will remain in effect (meaning this test can be used) for the duration of the COVID-19 declaration under Section 564(b)(1) of the Act, 21 U.S.C. section 360bbb-3(b)(1), unless the authorization is terminated or revoked.  Performed at [facility], [address] [place], [place] [address]   Urinalysis, Routine w reflex microscopic -Urine, Clean Catch     Status: Abnormal   Collection Time: 07/27/24 12:18 PM  Result Value Ref Range   Color, Urine YELLOW YELLOW   APPearance CLEAR CLEAR   Specific Gravity, Urine 1.014 1.005 - 1.030   pH 7.5 5.0 - 8.0   Glucose, UA NEGATIVE NEGATIVE mg/dL   Hgb urine dipstick SMALL (A) NEGATIVE   Bilirubin Urine NEGATIVE NEGATIVE   Ketones, ur 15 (A) NEGATIVE mg/dL   Protein, ur TRACE (A) NEGATIVE mg/dL   Nitrite NEGATIVE NEGATIVE   Leukocytes,Ua NEGATIVE NEGATIVE   RBC / HPF 11-20 0 - 5 RBC/hpf    WBC, UA 0-5 0 - 5 WBC/hpf   Bacteria, UA NONE SEEN NONE SEEN   Squamous Epithelial / HPF 0-5 0 - 5 /HPF   Mucus PRESENT     Comment: Performed at [facility], [address], [place], [place] [address]  Troponin T, High Sensitivity     Status: None   Collection Time: 07/27/24  2:30 PM  Result Value Ref Range   Troponin T High Sensitivity <15 0 - 19 ng/L    Comment: Performed at [facility], [address], [place], [place] [address]  NMR, lipoprofile     Status: Abnormal   Collection Time: 09/20/24  9:16 AM  Result Value Ref Range   LDL Particle Number [id number] <1,000 nmol/L    Comment:                           Low                   <  1000                           Moderate         1000 - 1299                           Borderline-High  1300 - 1599                           High             1600 - 2000                           Very High             > 2000    LDL-C (NIH Calc) 138 (H) 0 - 99 mg/dL    Comment:                           Optimal               <  100                           Above optimal     100 -  129                           Borderline        130 -  159                           High              160 -  189                           Very high             >  189    HDL-C 111 >39 mg/dL   Triglycerides 65 0 - 149 mg/dL   Cholesterol, Total 740 (H) 100 - 199 mg/dL   HDL Particle Number [id number] >=30.5 umol/L   Small LDL Particle Number <90 <=527 nmol/L   LDL Size 21.8 >20.5 nm    Comment:  ----------------------------------------------------------                  ** INTERPRETATIVE INFORMATION**                  PARTICLE CONCENTRATION AND SIZE                     <--Lower CVD Risk   Higher CVD Risk-->   LDL AND HDL PARTICLES   Percentile in Reference Population   HDL-P (total)        High     75th    50th    25th   Low                        >34.9    34.9    30.5    26.7   <26.7   Small LDL-P          Low      25th  50th    75th    High                        <117     117     527     839    >839   LDL Size   <-Large (Pattern A)->    <-Small (Pattern B)->                     23.0    20.6           20.5      19.0  ---------------------------------------------------------- Small LDL-P and LDL Size are associated with CVD risk, but not after LDL-P is taken into account.    LP-IR Score <25 <=45    Comment: INSULIN  RESISTANCE MARKER     Insulin  Sensitive    Insulin  Resistant-->            Percentile in Reference Population Insulin  Resistance Score LP-IR Score   Low   25th   50th   75th   High               <27   27     45     63     >63 LP-IR Score is inaccurate if patient is non-fasting. The LP-IR score is a laboratory developed index that has been associated with insulin  resistance and diabetes risk and should be used as one component of a physician's clinical assessment.         Beverley Adine Hummer, MD, MS  "

## 2024-10-06 NOTE — Patient Instructions (Addendum)
 It was very nice to see you today!  VISIT SUMMARY: Today, we discussed your management plan for mixed hyperlipidemia and migraines. We reviewed your current treatments and made some adjustments to better manage your conditions.  YOUR PLAN: MIXED HYPERLIPIDEMIA: You have high levels of LDL and HDL cholesterol, which is likely genetic. Your cholesterol levels remain high despite treatment, and you have had difficulty tolerating certain medications. -Enroll in the [facility] trial for lipoprotein A lowering agents. -Continue to manage cholesterol to protect heart health. -You were given printed lab results and the cardiologist's report.  MIGRAINE WITH ASSOCIATED TINNITUS: You have a history of severe migraines, sometimes requiring emergency care, and you also experience constant tinnitus since your last severe headache. -Start taking sumatriptan  100 mg at the onset of migraine symptoms. -Consider starting amitriptyline  25 mg at bedtime, and you can halve the dose if needed. -Follow up in one month to assess your response to amitriptyline .  Return in about 1 month (around 11/06/2024).   Take care, Laura Hummer, MD, MS   PLEASE NOTE:  If you had any lab tests, please let us  know if you have not heard back within a few days. You may see your results on mychart before we have a chance to review them but we will give you a call once they are reviewed by us .   If we ordered any referrals today, please let us  know if you have not heard from their office within the next week.   If you had any urgent prescriptions sent in today, please check with the pharmacy within an hour of our visit to make sure the prescription was transmitted appropriately.   Please try these tips to maintain a healthy lifestyle:  Eat at least 3 REAL meals and 1-2 snacks per day.  Aim for no more than 5 hours between eating.  If you eat breakfast, please do so within one hour of getting up.   Each meal should contain half  fruits/vegetables, one quarter protein, and one quarter carbs (no bigger than a computer mouse)  Cut down on sweet beverages. This includes juice, soda, and sweet tea.   Drink at least 1 glass of water with each meal and aim for at least 8 glasses per day  Exercise at least 150 minutes every week.

## 2024-10-07 ENCOUNTER — Telehealth: Payer: Self-pay

## 2024-10-07 NOTE — Telephone Encounter (Signed)
 Please send a copy of the CT results to patient when I received.

## 2024-10-20 LAB — HM MAMMOGRAPHY

## 2024-10-21 ENCOUNTER — Encounter: Payer: Self-pay | Admitting: Family Medicine

## 2024-11-10 ENCOUNTER — Ambulatory Visit: Admitting: Family Medicine

## 2024-11-17 ENCOUNTER — Ambulatory Visit: Admitting: Family Medicine

## 2025-01-20 ENCOUNTER — Ambulatory Visit

## 2025-05-18 ENCOUNTER — Ambulatory Visit: Admitting: Internal Medicine

## 2025-05-24 ENCOUNTER — Ambulatory Visit: Admitting: Internal Medicine

## 2025-07-01 ENCOUNTER — Ambulatory Visit

## 2025-08-18 ENCOUNTER — Ambulatory Visit: Admitting: Dermatology
